# Patient Record
Sex: Male | Born: 1945 | Race: White | Hispanic: No | Smoking: Current every day smoker
Health system: Southern US, Community
[De-identification: ages and names within clinical notes are randomized; demographics above are authoritative.]

## PROBLEM LIST (undated history)

## (undated) DIAGNOSIS — F17200 Nicotine dependence, unspecified, uncomplicated: Secondary | ICD-10-CM

## (undated) DIAGNOSIS — J449 Chronic obstructive pulmonary disease, unspecified: Secondary | ICD-10-CM

## (undated) DIAGNOSIS — F1010 Alcohol abuse, uncomplicated: Secondary | ICD-10-CM

---

## 2019-07-27 ENCOUNTER — Other Ambulatory Visit: Payer: Self-pay

## 2019-07-27 ENCOUNTER — Emergency Department: Payer: Medicare Other

## 2019-07-27 ENCOUNTER — Inpatient Hospital Stay
Admission: EM | Admit: 2019-07-27 | Discharge: 2019-08-20 | DRG: 870 | Disposition: A | Discharge status: Skilled Nursing Facility | Payer: Medicare Other | Attending: Internal Medicine | Admitting: Internal Medicine

## 2019-07-27 DIAGNOSIS — N390 Urinary tract infection, site not specified: Secondary | ICD-10-CM | POA: Diagnosis present

## 2019-07-27 DIAGNOSIS — N170 Acute kidney failure with tubular necrosis: Secondary | ICD-10-CM | POA: Diagnosis not present

## 2019-07-27 DIAGNOSIS — I350 Nonrheumatic aortic (valve) stenosis: Secondary | ICD-10-CM | POA: Diagnosis present

## 2019-07-27 DIAGNOSIS — F17210 Nicotine dependence, cigarettes, uncomplicated: Secondary | ICD-10-CM | POA: Diagnosis present

## 2019-07-27 DIAGNOSIS — I808 Phlebitis and thrombophlebitis of other sites: Secondary | ICD-10-CM | POA: Diagnosis not present

## 2019-07-27 DIAGNOSIS — J9690 Respiratory failure, unspecified, unspecified whether with hypoxia or hypercapnia: Secondary | ICD-10-CM | POA: Diagnosis not present

## 2019-07-27 DIAGNOSIS — I5031 Acute diastolic (congestive) heart failure: Secondary | ICD-10-CM

## 2019-07-27 DIAGNOSIS — R001 Bradycardia, unspecified: Secondary | ICD-10-CM | POA: Diagnosis not present

## 2019-07-27 DIAGNOSIS — K5900 Constipation, unspecified: Secondary | ICD-10-CM | POA: Diagnosis present

## 2019-07-27 DIAGNOSIS — E232 Diabetes insipidus: Secondary | ICD-10-CM | POA: Diagnosis present

## 2019-07-27 DIAGNOSIS — R918 Other nonspecific abnormal finding of lung field: Secondary | ICD-10-CM

## 2019-07-27 DIAGNOSIS — J9600 Acute respiratory failure, unspecified whether with hypoxia or hypercapnia: Secondary | ICD-10-CM

## 2019-07-27 DIAGNOSIS — E870 Hyperosmolality and hypernatremia: Secondary | ICD-10-CM

## 2019-07-27 DIAGNOSIS — R404 Transient alteration of awareness: Secondary | ICD-10-CM | POA: Diagnosis present

## 2019-07-27 DIAGNOSIS — R0989 Other specified symptoms and signs involving the circulatory and respiratory systems: Secondary | ICD-10-CM

## 2019-07-27 DIAGNOSIS — G459 Transient cerebral ischemic attack, unspecified: Secondary | ICD-10-CM

## 2019-07-27 DIAGNOSIS — A419 Sepsis, unspecified organism: Principal | ICD-10-CM | POA: Diagnosis present

## 2019-07-27 DIAGNOSIS — G9341 Metabolic encephalopathy: Secondary | ICD-10-CM

## 2019-07-27 DIAGNOSIS — J984 Other disorders of lung: Secondary | ICD-10-CM

## 2019-07-27 DIAGNOSIS — I110 Hypertensive heart disease with heart failure: Secondary | ICD-10-CM | POA: Diagnosis present

## 2019-07-27 DIAGNOSIS — Z6841 Body Mass Index (BMI) 40.0 and over, adult: Secondary | ICD-10-CM

## 2019-07-27 DIAGNOSIS — E0590 Thyrotoxicosis, unspecified without thyrotoxic crisis or storm: Secondary | ICD-10-CM | POA: Diagnosis not present

## 2019-07-27 DIAGNOSIS — I10 Essential (primary) hypertension: Secondary | ICD-10-CM

## 2019-07-27 DIAGNOSIS — J9601 Acute respiratory failure with hypoxia: Secondary | ICD-10-CM | POA: Diagnosis present

## 2019-07-27 DIAGNOSIS — T82868A Thrombosis of vascular prosthetic devices, implants and grafts, initial encounter: Secondary | ICD-10-CM | POA: Diagnosis not present

## 2019-07-27 DIAGNOSIS — Z66 Do not resuscitate: Secondary | ICD-10-CM | POA: Diagnosis present

## 2019-07-27 DIAGNOSIS — L304 Erythema intertrigo: Secondary | ICD-10-CM | POA: Diagnosis not present

## 2019-07-27 DIAGNOSIS — F1027 Alcohol dependence with alcohol-induced persisting dementia: Secondary | ICD-10-CM | POA: Diagnosis present

## 2019-07-27 DIAGNOSIS — D329 Benign neoplasm of meninges, unspecified: Secondary | ICD-10-CM | POA: Diagnosis present

## 2019-07-27 DIAGNOSIS — E7220 Disorder of urea cycle metabolism, unspecified: Secondary | ICD-10-CM | POA: Diagnosis not present

## 2019-07-27 DIAGNOSIS — I4892 Unspecified atrial flutter: Secondary | ICD-10-CM | POA: Diagnosis not present

## 2019-07-27 DIAGNOSIS — B370 Candidal stomatitis: Secondary | ICD-10-CM | POA: Diagnosis not present

## 2019-07-27 DIAGNOSIS — G9389 Other specified disorders of brain: Secondary | ICD-10-CM | POA: Diagnosis not present

## 2019-07-27 DIAGNOSIS — F10231 Alcohol dependence with withdrawal delirium: Secondary | ICD-10-CM | POA: Diagnosis not present

## 2019-07-27 DIAGNOSIS — J690 Pneumonitis due to inhalation of food and vomit: Secondary | ICD-10-CM | POA: Diagnosis not present

## 2019-07-27 DIAGNOSIS — Z20822 Contact with and (suspected) exposure to covid-19: Secondary | ICD-10-CM | POA: Diagnosis present

## 2019-07-27 DIAGNOSIS — Z452 Encounter for adjustment and management of vascular access device: Secondary | ICD-10-CM

## 2019-07-27 DIAGNOSIS — J811 Chronic pulmonary edema: Secondary | ICD-10-CM | POA: Diagnosis present

## 2019-07-27 DIAGNOSIS — J441 Chronic obstructive pulmonary disease with (acute) exacerbation: Secondary | ICD-10-CM | POA: Diagnosis not present

## 2019-07-27 DIAGNOSIS — R451 Restlessness and agitation: Secondary | ICD-10-CM | POA: Diagnosis not present

## 2019-07-27 DIAGNOSIS — Z4659 Encounter for fitting and adjustment of other gastrointestinal appliance and device: Secondary | ICD-10-CM

## 2019-07-27 DIAGNOSIS — J9602 Acute respiratory failure with hypercapnia: Secondary | ICD-10-CM | POA: Diagnosis present

## 2019-07-27 DIAGNOSIS — R1310 Dysphagia, unspecified: Secondary | ICD-10-CM | POA: Diagnosis present

## 2019-07-27 HISTORY — DX: Nicotine dependence, unspecified, uncomplicated: F17.200

## 2019-07-27 HISTORY — DX: Alcohol abuse, uncomplicated: F10.10

## 2019-07-27 LAB — URINALYSIS, COMPLETE (UACMP) WITH MICROSCOPIC
Bilirubin Urine: NEGATIVE
Glucose, UA: >=500 mg/dL — AB
Ketones, ur: NEGATIVE mg/dL
Leukocytes,Ua: NEGATIVE
Nitrite: NEGATIVE
Protein, ur: >=300 mg/dL — AB
Specific Gravity, Urine: 1.009 (ref 1.005–1.030)
pH: 6 (ref 5.0–8.0)

## 2019-07-27 LAB — CBC WITH DIFFERENTIAL/PLATELET
Abs Immature Granulocytes: 0.25 10*3/uL — ABNORMAL HIGH (ref 0.00–0.07)
Basophils Absolute: 0.1 10*3/uL (ref 0.0–0.1)
Basophils Relative: 1 %
Eosinophils Absolute: 0.4 10*3/uL (ref 0.0–0.5)
Eosinophils Relative: 2 %
HCT: 48.2 % (ref 39.0–52.0)
Hemoglobin: 14.6 g/dL (ref 13.0–17.0)
Immature Granulocytes: 1 %
Lymphocytes Relative: 54 %
Lymphs Abs: 10.5 10*3/uL — ABNORMAL HIGH (ref 0.7–4.0)
MCH: 31.3 pg (ref 26.0–34.0)
MCHC: 30.3 g/dL (ref 30.0–36.0)
MCV: 103.2 fL — ABNORMAL HIGH (ref 80.0–100.0)
Monocytes Absolute: 1.3 10*3/uL — ABNORMAL HIGH (ref 0.1–1.0)
Monocytes Relative: 7 %
Neutro Abs: 6.9 10*3/uL (ref 1.7–7.7)
Neutrophils Relative %: 35 %
Platelets: 327 10*3/uL (ref 150–400)
RBC: 4.67 MIL/uL (ref 4.22–5.81)
RDW: 13.7 % (ref 11.5–15.5)
WBC: 19.5 10*3/uL — ABNORMAL HIGH (ref 4.0–10.5)
nRBC: 0 % (ref 0.0–0.2)

## 2019-07-27 LAB — BLOOD GAS, ARTERIAL
Acid-base deficit: 12 mmol/L — ABNORMAL HIGH (ref 0.0–2.0)
Bicarbonate: 19.7 mmol/L — ABNORMAL LOW (ref 20.0–28.0)
FIO2: 100
MECHVT: 550 mL
O2 Saturation: 99.9 %
PEEP: 5 cmH2O
Patient temperature: 37
RATE: 16 resp/min
pCO2 arterial: 73 mmHg (ref 32.0–48.0)
pH, Arterial: 7.04 — CL (ref 7.350–7.450)
pO2, Arterial: 399 mmHg — ABNORMAL HIGH (ref 83.0–108.0)

## 2019-07-27 LAB — POC SARS CORONAVIRUS 2 AG: SARS Coronavirus 2 Ag: NEGATIVE

## 2019-07-27 LAB — RESPIRATORY PANEL BY RT PCR (FLU A&B, COVID)
Influenza A by PCR: NEGATIVE
Influenza B by PCR: NEGATIVE
SARS Coronavirus 2 by RT PCR: NEGATIVE

## 2019-07-27 LAB — LACTIC ACID, PLASMA: Lactic Acid, Venous: >11 mmol/L (ref 0.5–1.9)

## 2019-07-27 IMAGING — DX DG CHEST 1V PORT
1 series · 1 of 1 positions shown · non-contrast
Comparison: None.

CLINICAL DATA: Status post intubation

EXAM:
PORTABLE CHEST 1 VIEW

[chest ap]
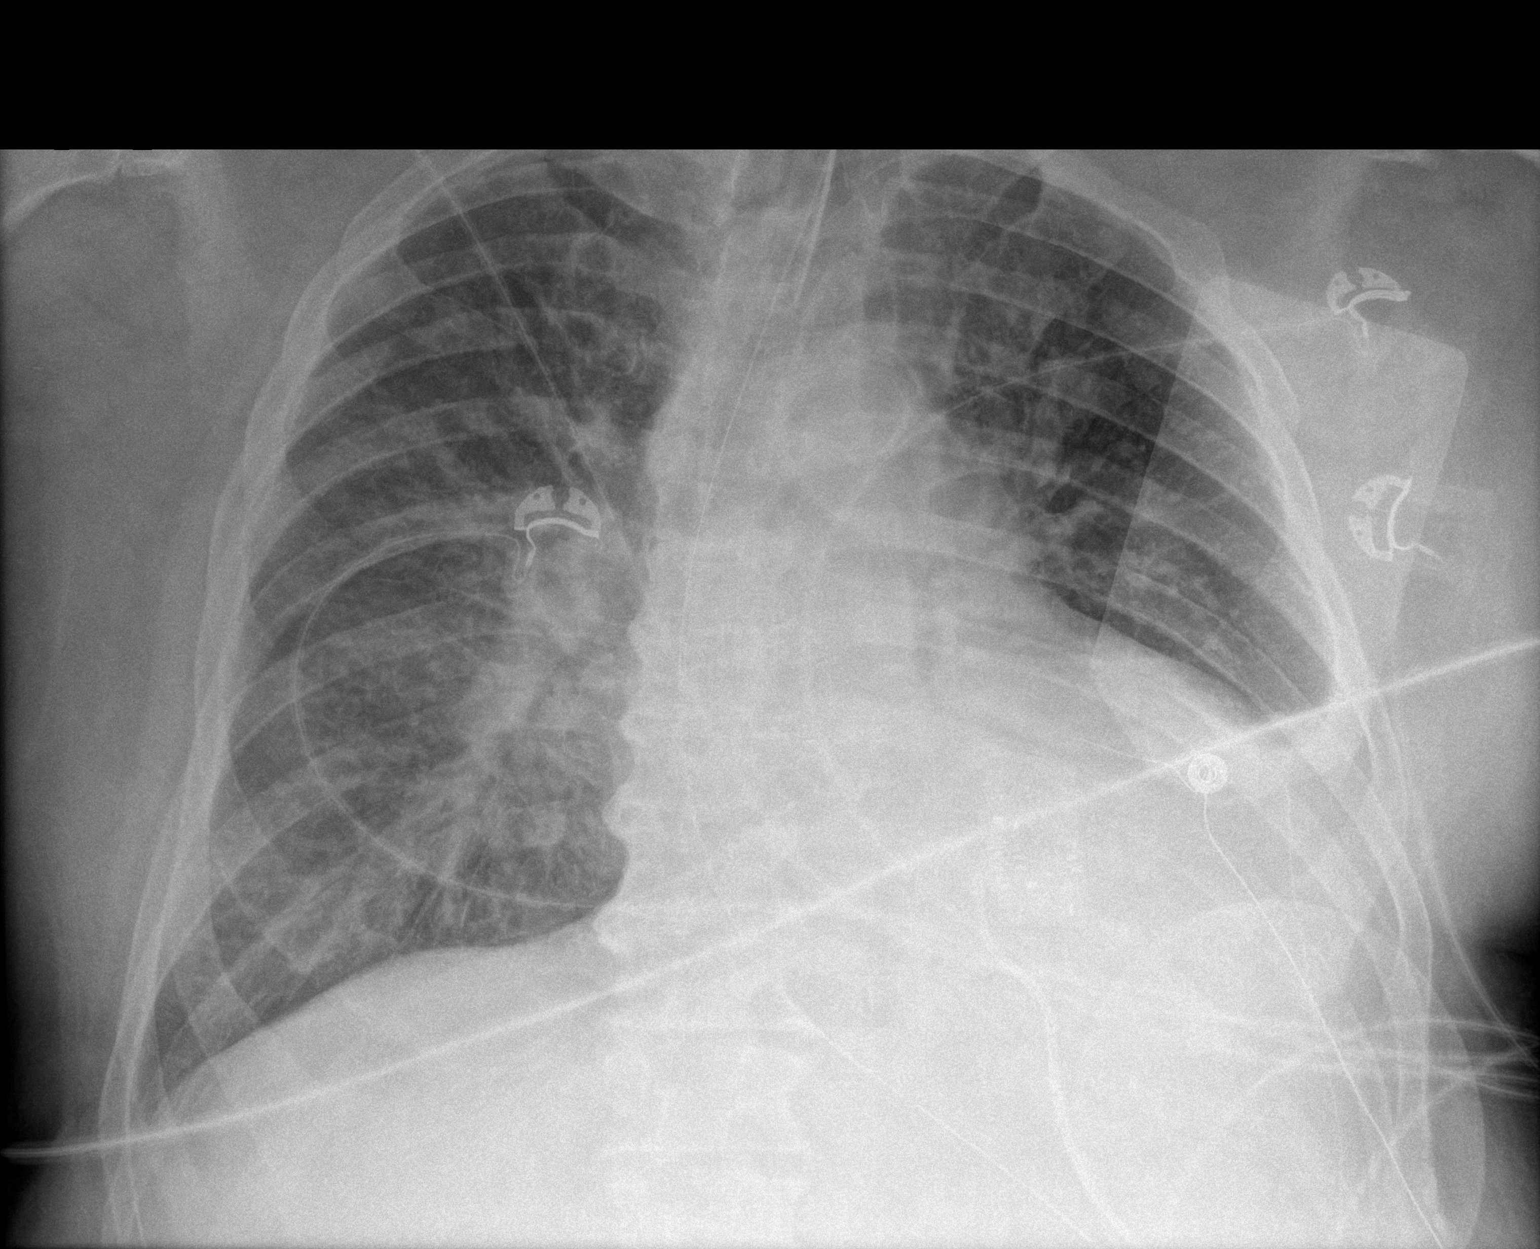

[1 of 1 positions shown; findings below may reference images not displayed]

FINDINGS: Cardiac shadow is mildly enlarged. Small left pleural effusion is
noted. Vascular congestion is seen with mild edema. Endotracheal
tube and gastric catheter are noted in satisfactory position.
IMPRESSION: Small left pleural effusion.

Changes consistent with CHF.

Tubes and lines as described above.

## 2019-07-27 IMAGING — DX DG ABDOMEN 1V
1 series · 1 of 1 positions shown · non-contrast
Comparison: None.

CLINICAL DATA: Check gastric catheter placement

EXAM:
ABDOMEN - 1 VIEW

[abdomen supine]
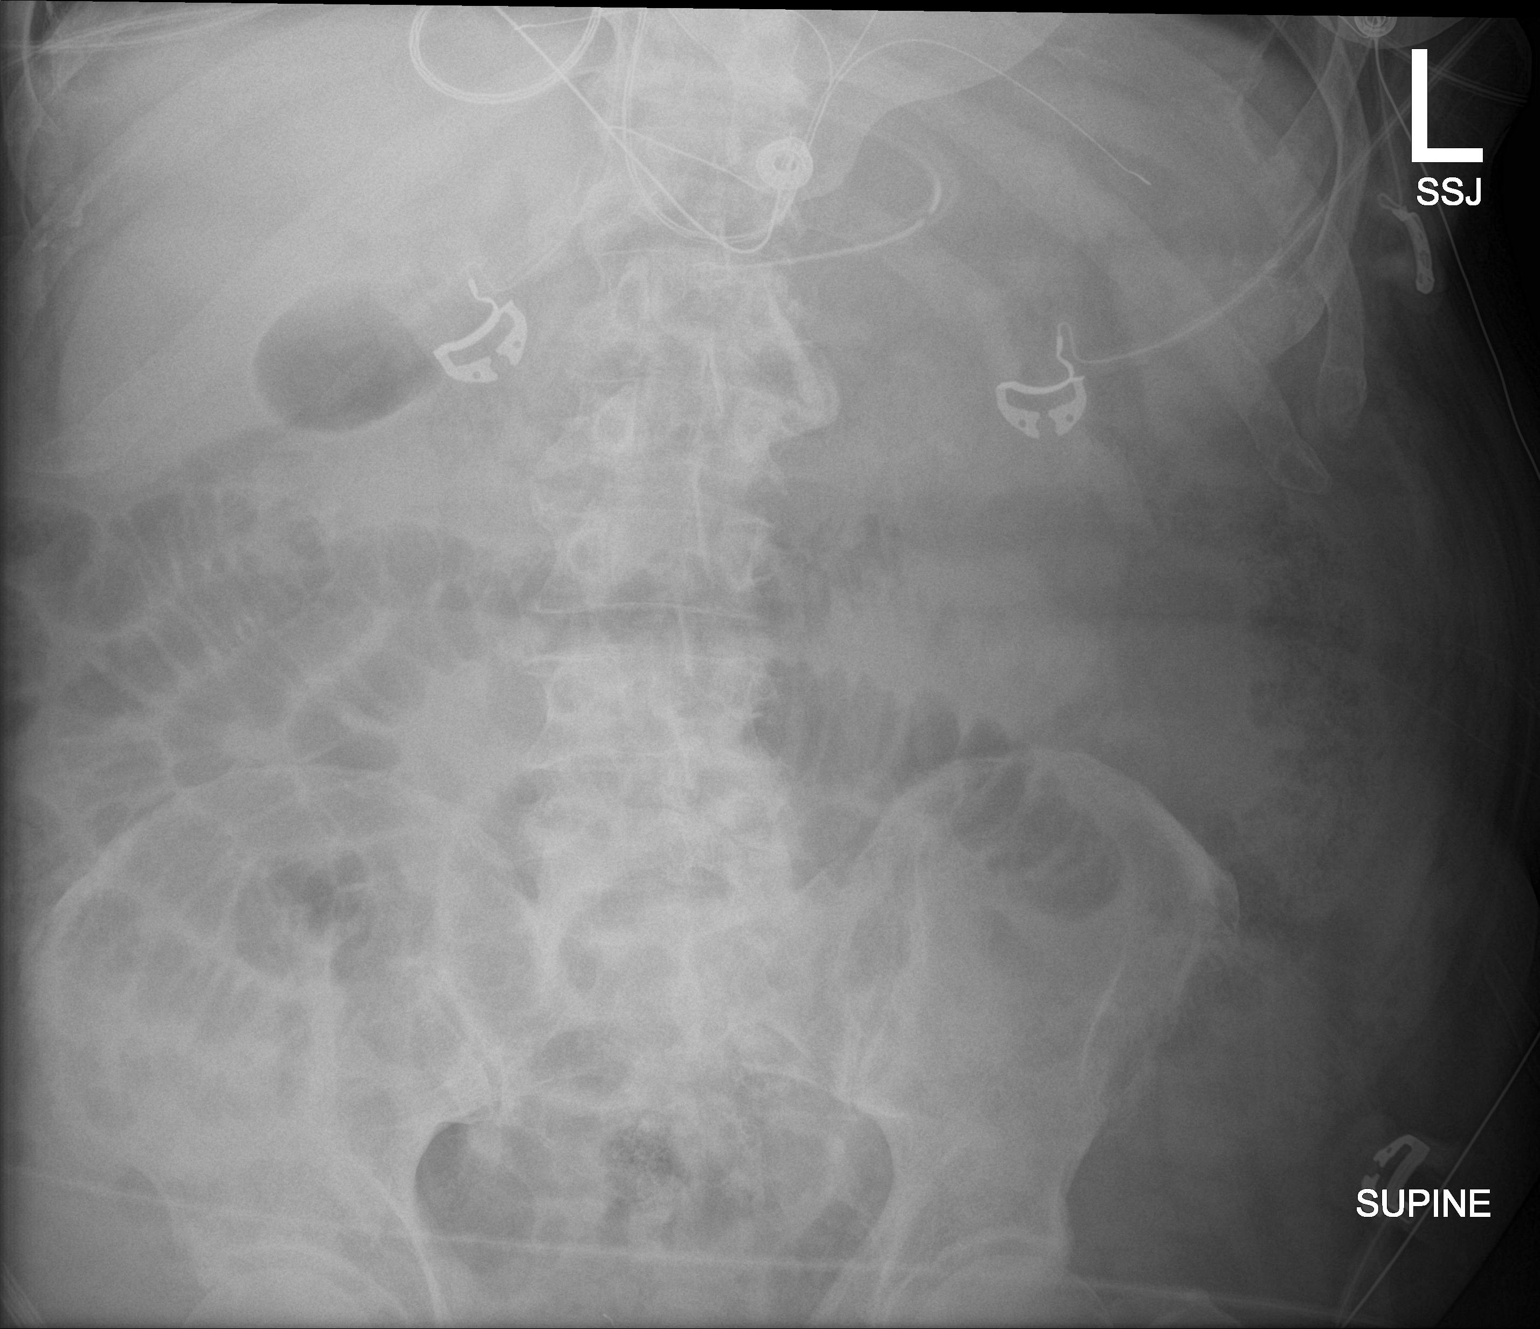

[1 of 1 positions shown; findings below may reference images not displayed]

FINDINGS: Mild prominence of the small bowel is noted without definitive
obstructive change. Gastric catheter is noted within the stomach. No
bony abnormality is seen.
IMPRESSION: Gastric catheter in the stomach.

Mild prominence of the small bowel. CT would be helpful for further
evaluation.

## 2019-07-27 MED ORDER — VANCOMYCIN HCL IN DEXTROSE 1-5 GM/200ML-% IV SOLN
1000.0000 mg | Freq: Once | INTRAVENOUS | Status: AC
  Administered 2019-07-28: 1000 mg via INTRAVENOUS
  Filled 2019-07-27: qty 200

## 2019-07-27 MED ORDER — PROPOFOL 1000 MG/100ML IV EMUL
5.0000 ug/kg/min | INTRAVENOUS | Status: DC
  Administered 2019-07-27: 23:00:00 20 ug/kg/min via INTRAVENOUS
  Administered 2019-07-28: 50 ug/kg/min via INTRAVENOUS
  Administered 2019-07-28 (×3): 30 ug/kg/min via INTRAVENOUS
  Administered 2019-07-28: 50 ug/kg/min via INTRAVENOUS
  Administered 2019-07-29 (×3): 30 ug/kg/min via INTRAVENOUS
  Administered 2019-07-29: 40 ug/kg/min via INTRAVENOUS
  Administered 2019-07-29: 30 ug/kg/min via INTRAVENOUS
  Administered 2019-07-30 (×3): 40 ug/kg/min via INTRAVENOUS
  Administered 2019-07-30 – 2019-08-01 (×5): 30 ug/kg/min via INTRAVENOUS
  Administered 2019-08-01: 19:00:00 19.988 ug/kg/min via INTRAVENOUS
  Administered 2019-08-01: 20 ug/kg/min via INTRAVENOUS
  Administered 2019-08-02: 02:00:00 30 ug/kg/min via INTRAVENOUS
  Administered 2019-08-02: 25 ug/kg/min via INTRAVENOUS
  Administered 2019-08-02: 17:00:00 30 ug/kg/min via INTRAVENOUS
  Administered 2019-08-02: 11:00:00 20 ug/kg/min via INTRAVENOUS
  Administered 2019-08-02 – 2019-08-03 (×3): 30 ug/kg/min via INTRAVENOUS
  Filled 2019-07-27 (×34): qty 100

## 2019-07-27 MED ORDER — VECURONIUM BROMIDE 10 MG IV SOLR
10.0000 mg | Freq: Once | INTRAVENOUS | Status: AC
  Administered 2019-07-27: 10 mg via INTRAVENOUS

## 2019-07-27 MED ORDER — PIPERACILLIN-TAZOBACTAM 3.375 G IVPB 30 MIN
3.3750 g | Freq: Once | INTRAVENOUS | Status: AC
  Administered 2019-07-28: 3.375 g via INTRAVENOUS
  Filled 2019-07-27: qty 50

## 2019-07-27 NOTE — ED Notes (Signed)
100 succs given by Rosalita Chessman

## 2019-07-27 NOTE — ED Provider Notes (Addendum)
[facility] Emergency Department Provider Note       Time seen: ----------------------------------------- 10:40 PM on 07/27/2019 ----------------------------------------- Level V caveat: History/ROS limited by respiratory distress  I have reviewed the triage vital signs and the nursing notes.  HISTORY   Chief Complaint Respiratory Distress   HPI Antonio [name] is a 74 y.o. male with no known past medical history who presents to the ED for respiratory distress.  Patient reportedly had called EMS and went out to meet the EMS unit and collapsed.  He arrived unresponsive with a pulse receiving bag-valve-mask ventilations.  No further information is known at this time.  History reviewed. No pertinent past medical history.  There are no problems to display for this patient.   History reviewed. No pertinent surgical history.  Allergies Patient has no allergy information on record.  Social History Social History   Tobacco Use  . Smoking status: Not on file  . Smokeless tobacco: Never Used  Substance Use Topics  . Alcohol use: Never  . Drug use: Never    Review of Systems Unknown, reported shortness of breath  All systems negative/normal/unremarkable except as stated in the HPI  ____________________________________________   PHYSICAL EXAM:  VITAL SIGNS: ED Triage Vitals  Enc Vitals Group     BP 07/27/19 2230 (!) 258/105     Pulse Rate 07/27/19 2230 (!) 116     Resp 07/27/19 2230 (!) 24     Temp 07/27/19 2238 (!) 95 F (35 C)     Temp src --      SpO2 07/27/19 2230 92 %     Weight 07/27/19 2231 250 lb (113.4 kg)     Height 07/27/19 2231 5\' 10"  (1.778 m)     Head Circumference --      Peak Flow --      Pain Score 07/27/19 2231 Asleep     Pain Loc --      Pain Edu? --      Excl. in [place]? --     Constitutional: Patient is not alert, does not respond to pain, severe distress Eyes: Conjunctivae are injected bilaterally ENT      Head:  Normocephalic and atraumatic.      Nose: No congestion/rhinnorhea.      Mouth/Throat: Mucous membranes are moist.      Neck: No stridor. Cardiovascular: Rapid rate, regular rhythm. No murmurs, rubs, or gallops. Respiratory: Wheezing and crackles bilaterally, diminished breath sounds Gastrointestinal: Soft and nontender. Normal bowel sounds Musculoskeletal: Nontender with normal range of motion in extremities.  Mild edema Neurologic: Patient does not respond to painful stimuli on arrival Skin:  Skin is warm, dry and intact.  Mottled appearing skin Psychiatric: Cannot cooperate with examination ____________________________________________  EKG: Interpreted by me.  Sinus tachycardia with rate of 170 bpm, normal PR interval, possible anterior infarct  ____________________________________________  ED COURSE:  As part of my medical decision making, I reviewed the following data within the [place] History obtained from family if available, nursing notes, old chart and ekg, as well as notes from prior ED visits. Patient presented for acute respiratory distress, we will assess with labs and imaging as indicated at this time. Clinical Course as of Jul 26 2301  Fri Jul 27, 2019  2248 Heart rate and blood pressure appear to be improving at this time post intubation   [JW]    Clinical Course User Index [JW] Earleen Newport, MD   Procedure Name: Intubation Date/Time: 07/27/2019  10:43 PM Performed by: Earleen Newport, MD Pre-anesthesia Checklist: Patient identified, Patient being monitored, Emergency Drugs available, Timeout performed and Suction available Oxygen Delivery Method: Non-rebreather mask Preoxygenation: Pre-oxygenation with 100% oxygen Induction Type: Rapid sequence Ventilation: Mask ventilation without difficulty Laryngoscope Size: 4 Tube size: 7.5 mm Number of attempts: 2 Placement Confirmation: ETT inserted through vocal cords under direct vision,   CO2 detector and Breath sounds checked- equal and bilateral Dental Injury: Teeth and Oropharynx as per pre-operative assessment  Difficulty Due To: Difficult Airway- due to large tongue, Difficult Airway-  due to edematous airway, Difficult Airway- due to limited oral opening and Difficult Airway-due to vocal cord/laryngeal edema     OG placement  Date/Time: 07/27/2019 11:04 PM Performed by: Earleen Newport, MD Authorized by: Earleen Newport, MD  Consent: The procedure was performed in an emergent situation. Time out: Immediately prior to procedure a "time out" was called to verify the correct patient, procedure, equipment, support staff and site/side marked as required. Preparation: Patient was prepped and draped in the usual sterile fashion. Local anesthesia used: no  Anesthesia: Local anesthesia used: no  Sedation: Patient sedated: no     Antonio [name] was evaluated in Emergency Department on 07/27/2019 for the symptoms described in the history of present illness. He was evaluated in the context of the global COVID-19 pandemic, which necessitated consideration that the patient might be at risk for infection with the SARS-CoV-2 virus that causes COVID-19. Institutional protocols and algorithms that pertain to the evaluation of patients at risk for COVID-19 are in a state of rapid change based on information released by regulatory bodies including the CDC and federal and state organizations. These policies and algorithms were followed during the patient's care in the ED.  ____________________________________________   LABS (pertinent positives/negatives)  Labs Reviewed  LACTIC ACID, PLASMA - Abnormal; Notable for the following components:      Result Value   Lactic Acid, Venous >11.0 (*)    All other components within normal limits  CBC WITH DIFFERENTIAL/PLATELET - Abnormal; Notable for the following components:   WBC 19.5 (*)    MCV 103.2 (*)    Lymphs Abs 10.5 (*)     Monocytes Absolute 1.3 (*)    Abs Immature Granulocytes 0.25 (*)    All other components within normal limits  BLOOD GAS, ARTERIAL - Abnormal; Notable for the following components:   pH, Arterial 7.04 (*)    pCO2 arterial 73 (*)    pO2, Arterial 399 (*)    Bicarbonate 19.7 (*)    Acid-base deficit 12.0 (*)    All other components within normal limits  URINALYSIS, COMPLETE (UACMP) WITH MICROSCOPIC - Abnormal; Notable for the following components:   Color, Urine YELLOW (*)    APPearance CLOUDY (*)    Glucose, UA >=500 (*)    Hgb urine dipstick SMALL (*)    Protein, ur >=300 (*)    Bacteria, UA RARE (*)    All other components within normal limits  RESPIRATORY PANEL BY RT PCR (FLU A&B, COVID)  CULTURE, BLOOD (ROUTINE X 2)  CULTURE, BLOOD (ROUTINE X 2)  URINE CULTURE  LACTIC ACID, PLASMA  COMPREHENSIVE METABOLIC PANEL  BRAIN NATRIURETIC PEPTIDE  PATHOLOGIST SMEAR REVIEW  POC SARS CORONAVIRUS 2 AG  TROPONIN I (HIGH SENSITIVITY)   CRITICAL CARE Performed by: Laurence Aly   Total critical care time: 60 minutes  Critical care time was exclusive of separately billable procedures and treating other patients.  Critical  care was necessary to treat or prevent imminent or life-threatening deterioration.  Critical care was time spent personally by me on the following activities: development of treatment plan with patient and/or surrogate as well as nursing, discussions with consultants, evaluation of patient's response to treatment, examination of patient, obtaining history from patient or surrogate, ordering and performing treatments and interventions, ordering and review of laboratory studies, ordering and review of radiographic studies, pulse oximetry and re-evaluation of patient's condition.  RADIOLOGY Images were viewed by me  Chest x-ray Resembles pulmonary edema ____________________________________________   DIFFERENTIAL DIAGNOSIS   Acute respiratory failure,  hypercarbia, hypoxemia, PE, CHF, COVID-19, pneumonia  FINAL ASSESSMENT AND PLAN  Acute respiratory failure with hypoxia and hypercapnia, acute pulmonary edema   Plan: The patient had presented for acute respiratory failure. Patient's labs did reveal acidemia and hypercarbia. Patient's imaging resembled pulmonary edema.  Immediately on arrival as dictated above he was intubated and placed on a propofol drip for sedation.  Heart rate and blood pressure improved dramatically over time.  We still do not have any information on this patient.  I will discuss with the ICU doctor for admission.   Laurence Aly, MD    Note: This note was generated in part or whole with voice recognition software. Voice recognition is usually quite accurate but there are transcription errors that can and very often do occur. I apologize for any typographical errors that were not detected and corrected.     Earleen Newport, MD 07/27/19 [id number]    Earleen Newport, MD 07/27/19 2351

## 2019-07-27 NOTE — ED Triage Notes (Signed)
Pt to ED via EMS from home. Per ems pt walked out to truck, collapsed on stretcher and became unresponsive. Pt never lost pulses but became apneic. Pt being bagged on ems arrival, sats 98%. HR 118 ST on monitor. Pt has unknown history. MD williams preparing for intubation at bedside. Pt unresponsive to all stimuli.

## 2019-07-27 NOTE — ED Notes (Signed)
20 etomidate given by Rosalita Chessman

## 2019-07-27 NOTE — ED Notes (Signed)
MD to bediside increased o2 on vent to 60

## 2019-07-28 DIAGNOSIS — Z20822 Contact with and (suspected) exposure to covid-19: Secondary | ICD-10-CM | POA: Diagnosis present

## 2019-07-28 DIAGNOSIS — B370 Candidal stomatitis: Secondary | ICD-10-CM | POA: Diagnosis not present

## 2019-07-28 DIAGNOSIS — G9341 Metabolic encephalopathy: Secondary | ICD-10-CM | POA: Diagnosis not present

## 2019-07-28 DIAGNOSIS — J9601 Acute respiratory failure with hypoxia: Secondary | ICD-10-CM | POA: Diagnosis present

## 2019-07-28 DIAGNOSIS — N170 Acute kidney failure with tubular necrosis: Secondary | ICD-10-CM | POA: Diagnosis not present

## 2019-07-28 DIAGNOSIS — J441 Chronic obstructive pulmonary disease with (acute) exacerbation: Secondary | ICD-10-CM | POA: Diagnosis not present

## 2019-07-28 DIAGNOSIS — A419 Sepsis, unspecified organism: Secondary | ICD-10-CM | POA: Diagnosis present

## 2019-07-28 DIAGNOSIS — Z6841 Body Mass Index (BMI) 40.0 and over, adult: Secondary | ICD-10-CM | POA: Diagnosis not present

## 2019-07-28 DIAGNOSIS — F10231 Alcohol dependence with withdrawal delirium: Secondary | ICD-10-CM | POA: Diagnosis not present

## 2019-07-28 DIAGNOSIS — J9602 Acute respiratory failure with hypercapnia: Secondary | ICD-10-CM | POA: Diagnosis present

## 2019-07-28 DIAGNOSIS — J9690 Respiratory failure, unspecified, unspecified whether with hypoxia or hypercapnia: Secondary | ICD-10-CM | POA: Diagnosis present

## 2019-07-28 DIAGNOSIS — E7220 Disorder of urea cycle metabolism, unspecified: Secondary | ICD-10-CM | POA: Diagnosis not present

## 2019-07-28 DIAGNOSIS — N390 Urinary tract infection, site not specified: Secondary | ICD-10-CM | POA: Diagnosis present

## 2019-07-28 DIAGNOSIS — I5031 Acute diastolic (congestive) heart failure: Secondary | ICD-10-CM | POA: Diagnosis present

## 2019-07-28 DIAGNOSIS — F1027 Alcohol dependence with alcohol-induced persisting dementia: Secondary | ICD-10-CM | POA: Diagnosis present

## 2019-07-28 DIAGNOSIS — I4892 Unspecified atrial flutter: Secondary | ICD-10-CM | POA: Diagnosis not present

## 2019-07-28 DIAGNOSIS — J811 Chronic pulmonary edema: Secondary | ICD-10-CM | POA: Diagnosis present

## 2019-07-28 DIAGNOSIS — E870 Hyperosmolality and hypernatremia: Secondary | ICD-10-CM | POA: Diagnosis not present

## 2019-07-28 DIAGNOSIS — I350 Nonrheumatic aortic (valve) stenosis: Secondary | ICD-10-CM | POA: Diagnosis present

## 2019-07-28 DIAGNOSIS — I110 Hypertensive heart disease with heart failure: Secondary | ICD-10-CM | POA: Diagnosis present

## 2019-07-28 DIAGNOSIS — E232 Diabetes insipidus: Secondary | ICD-10-CM | POA: Diagnosis present

## 2019-07-28 DIAGNOSIS — I808 Phlebitis and thrombophlebitis of other sites: Secondary | ICD-10-CM | POA: Diagnosis not present

## 2019-07-28 DIAGNOSIS — J690 Pneumonitis due to inhalation of food and vomit: Secondary | ICD-10-CM | POA: Diagnosis not present

## 2019-07-28 DIAGNOSIS — T82868A Thrombosis of vascular prosthetic devices, implants and grafts, initial encounter: Secondary | ICD-10-CM | POA: Diagnosis not present

## 2019-07-28 DIAGNOSIS — Z66 Do not resuscitate: Secondary | ICD-10-CM | POA: Diagnosis present

## 2019-07-28 LAB — BLOOD GAS, ARTERIAL
Acid-Base Excess: 2.5 mmol/L — ABNORMAL HIGH (ref 0.0–2.0)
Bicarbonate: 26.3 mmol/L (ref 20.0–28.0)
FIO2: 0.6
MECHVT: 550 mL
O2 Saturation: 99.5 %
PEEP: 5 cmH2O
Patient temperature: 37
RATE: 20 resp/min
pCO2 arterial: 37 mmHg (ref 32.0–48.0)
pH, Arterial: 7.46 — ABNORMAL HIGH (ref 7.350–7.450)
pO2, Arterial: 162 mmHg — ABNORMAL HIGH (ref 83.0–108.0)

## 2019-07-28 LAB — TROPONIN I (HIGH SENSITIVITY): Troponin I (High Sensitivity): 96 ng/L — ABNORMAL HIGH (ref <18)

## 2019-07-28 LAB — COMPREHENSIVE METABOLIC PANEL
ALT: 24 U/L (ref 0–44)
AST: 30 U/L (ref 15–41)
Albumin: 3.9 g/dL (ref 3.5–5.0)
Alkaline Phosphatase: 76 U/L (ref 38–126)
Anion gap: 16 — ABNORMAL HIGH (ref 5–15)
BUN: 16 mg/dL (ref 8–23)
CO2: 20 mmol/L — ABNORMAL LOW (ref 22–32)
Calcium: 9.1 mg/dL (ref 8.9–10.3)
Chloride: 102 mmol/L (ref 98–111)
Creatinine, Ser: 1.46 mg/dL — ABNORMAL HIGH (ref 0.61–1.24)
GFR calc Af Amer: 33 mL/min — ABNORMAL LOW (ref >60)
GFR calc non Af Amer: 29 mL/min — ABNORMAL LOW (ref >60)
Glucose, Bld: 266 mg/dL — ABNORMAL HIGH (ref 70–99)
Potassium: 4 mmol/L (ref 3.5–5.1)
Sodium: 138 mmol/L (ref 135–145)
Total Bilirubin: 0.5 mg/dL (ref 0.3–1.2)
Total Protein: 7.2 g/dL (ref 6.5–8.1)

## 2019-07-28 LAB — CBC
HCT: 39.7 % (ref 39.0–52.0)
Hemoglobin: 13.2 g/dL (ref 13.0–17.0)
MCH: 31.8 pg (ref 26.0–34.0)
MCHC: 33.2 g/dL (ref 30.0–36.0)
MCV: 95.7 fL (ref 80.0–100.0)
Platelets: 269 10*3/uL (ref 150–400)
RBC: 4.15 MIL/uL — ABNORMAL LOW (ref 4.22–5.81)
RDW: 13.8 % (ref 11.5–15.5)
WBC: 9.3 10*3/uL (ref 4.0–10.5)
nRBC: 0 % (ref 0.0–0.2)

## 2019-07-28 LAB — URINALYSIS, COMPLETE (UACMP) WITH MICROSCOPIC
Bacteria, UA: NONE SEEN
Bilirubin Urine: NEGATIVE
Glucose, UA: NEGATIVE mg/dL
Ketones, ur: NEGATIVE mg/dL
Nitrite: NEGATIVE
Protein, ur: 100 mg/dL — AB
RBC / HPF: >50 RBC/hpf — ABNORMAL HIGH (ref 0–5)
Specific Gravity, Urine: 1.013 (ref 1.005–1.030)
Squamous Epithelial / HPF: NONE SEEN (ref 0–5)
pH: 5 (ref 5.0–8.0)

## 2019-07-28 LAB — BASIC METABOLIC PANEL
Anion gap: 8 (ref 5–15)
BUN: 21 mg/dL (ref 8–23)
CO2: 26 mmol/L (ref 22–32)
Calcium: 8.6 mg/dL — ABNORMAL LOW (ref 8.9–10.3)
Chloride: 103 mmol/L (ref 98–111)
Creatinine, Ser: 1.46 mg/dL — ABNORMAL HIGH (ref 0.61–1.24)
GFR calc Af Amer: 54 mL/min — ABNORMAL LOW (ref >60)
GFR calc non Af Amer: 47 mL/min — ABNORMAL LOW (ref >60)
Glucose, Bld: 121 mg/dL — ABNORMAL HIGH (ref 70–99)
Potassium: 4.6 mmol/L (ref 3.5–5.1)
Sodium: 137 mmol/L (ref 135–145)

## 2019-07-28 LAB — BRAIN NATRIURETIC PEPTIDE: B Natriuretic Peptide: 1138 pg/mL — ABNORMAL HIGH (ref 0.0–100.0)

## 2019-07-28 LAB — LACTIC ACID, PLASMA: Lactic Acid, Venous: 1.5 mmol/L (ref 0.5–1.9)

## 2019-07-28 LAB — GLUCOSE, CAPILLARY: Glucose-Capillary: 86 mg/dL (ref 70–99)

## 2019-07-28 LAB — MRSA PCR SCREENING: MRSA by PCR: NEGATIVE

## 2019-07-28 MED ORDER — FENTANYL CITRATE (PF) 100 MCG/2ML IJ SOLN
50.0000 ug | INTRAMUSCULAR | Status: DC | PRN
  Administered 2019-07-30 – 2019-08-06 (×2): 100 ug via INTRAVENOUS
  Filled 2019-07-28 (×2): qty 2

## 2019-07-28 MED ORDER — MIDAZOLAM HCL 2 MG/2ML IJ SOLN
2.0000 mg | INTRAMUSCULAR | Status: DC | PRN
  Administered 2019-08-05 – 2019-08-07 (×2): 2 mg via INTRAVENOUS
  Filled 2019-07-28: qty 2

## 2019-07-28 MED ORDER — MIDAZOLAM HCL 2 MG/2ML IJ SOLN
2.0000 mg | INTRAMUSCULAR | Status: DC | PRN
  Filled 2019-07-28: qty 2

## 2019-07-28 MED ORDER — ORAL CARE MOUTH RINSE
15.0000 mL | OROMUCOSAL | Status: DC
  Administered 2019-07-28 – 2019-08-15 (×146): 15 mL via OROMUCOSAL

## 2019-07-28 MED ORDER — ENOXAPARIN SODIUM 30 MG/0.3ML ~~LOC~~ SOLN
30.0000 mg | SUBCUTANEOUS | Status: DC
  Filled 2019-07-28: qty 0.3

## 2019-07-28 MED ORDER — ONDANSETRON HCL 4 MG/2ML IJ SOLN: 4.0000 mg | Freq: Four times a day (QID) | INTRAMUSCULAR | Status: DC | PRN

## 2019-07-28 MED ORDER — FAMOTIDINE IN NACL 20-0.9 MG/50ML-% IV SOLN
20.0000 mg | Freq: Two times a day (BID) | INTRAVENOUS | Status: DC
  Administered 2019-07-28 – 2019-07-29 (×5): 20 mg via INTRAVENOUS
  Filled 2019-07-28 (×5): qty 50

## 2019-07-28 MED ORDER — CHLORHEXIDINE GLUCONATE 0.12% ORAL RINSE (MEDLINE KIT)
15.0000 mL | Freq: Two times a day (BID) | OROMUCOSAL | Status: DC
  Administered 2019-07-28 – 2019-08-15 (×33): 15 mL via OROMUCOSAL

## 2019-07-28 MED ORDER — ENOXAPARIN SODIUM 30 MG/0.3ML ~~LOC~~ SOLN: 30.0000 mg | SUBCUTANEOUS | Status: DC

## 2019-07-28 MED ORDER — DOCUSATE SODIUM 50 MG/5ML PO LIQD
100.0000 mg | Freq: Two times a day (BID) | ORAL | Status: DC | PRN
  Filled 2019-07-28: qty 10

## 2019-07-28 MED ORDER — ACETAMINOPHEN 325 MG PO TABS
650.0000 mg | ORAL_TABLET | ORAL | Status: DC | PRN
  Administered 2019-08-07 – 2019-08-08 (×4): 650 mg via ORAL
  Filled 2019-07-28 (×4): qty 2

## 2019-07-28 MED ORDER — FENTANYL CITRATE (PF) 100 MCG/2ML IJ SOLN
50.0000 ug | INTRAMUSCULAR | Status: DC | PRN
  Administered 2019-07-29: 50 ug via INTRAVENOUS
  Filled 2019-07-28 (×2): qty 2

## 2019-07-28 MED ORDER — FAMOTIDINE 40 MG/5ML PO SUSR
20.0000 mg | Freq: Two times a day (BID) | ORAL | Status: DC
  Filled 2019-07-28 (×2): qty 2.5

## 2019-07-28 MED ORDER — ENOXAPARIN SODIUM 40 MG/0.4ML ~~LOC~~ SOLN
40.0000 mg | SUBCUTANEOUS | Status: DC
  Administered 2019-07-28 – 2019-08-20 (×24): 40 mg via SUBCUTANEOUS
  Filled 2019-07-28 (×24): qty 0.4

## 2019-07-28 MED ORDER — SODIUM CHLORIDE 0.9 % IV SOLN
250.0000 mL | INTRAVENOUS | Status: DC | PRN
  Administered 2019-08-06 – 2019-08-08 (×2): 250 mL via INTRAVENOUS

## 2019-07-28 MED ORDER — SODIUM CHLORIDE 0.9% FLUSH: 3.0000 mL | INTRAVENOUS | Status: DC | PRN

## 2019-07-28 MED ORDER — SODIUM CHLORIDE 0.9% FLUSH: 3.0000 mL | Freq: Two times a day (BID) | INTRAVENOUS | Status: DC

## 2019-07-28 MED ORDER — CHLORHEXIDINE GLUCONATE CLOTH 2 % EX PADS
6.0000 | MEDICATED_PAD | Freq: Every day | CUTANEOUS | Status: DC
  Administered 2019-07-28 – 2019-07-30 (×3): 6 via TOPICAL

## 2019-07-28 NOTE — ED Notes (Signed)
Patient with eyes closed, vent with resp appear relaxed, patient not moving or attempting to touch equipment. Propofol at 18mcg/kg/min.

## 2019-07-28 NOTE — H&P (Signed)
Name: Antonio [name] MRN: [id number] DOB: 07/19/1876     CONSULTATION DATE: 07/27/2019  REFERRING MD : Jimmye Norman  CHIEF COMPLAINT:  Acute resp failure  HISTORY OF PRESENT ILLNESS:   Unknown Morbidly obese WM Per ems pt walked out to truck, collapsed on stretcher and became unresponsive. Pt never lost pulses but became apneic. Pt being bagged on ems arrival, sats 98%. HR 118 ST on monitor. Pt has unknown history.   Patient with severe resp distress Emergently intubated and sedated in ER  Critically ill Multiorgan failure  ER COURSE INTUBATION SEDATION GIVEN ABX(VANC/ZOSYN) COVID NEG WBC 19, Creat 1.4   SOCIAL HISTORY:  does not have a smoking history on file. He has never used smokeless tobacco. He reports that he does not drink alcohol or use drugs.  REVIEW OF SYSTEMS:   Unable to obtain due to critical illness   VITAL SIGNS: Temp:  [95 F (35 C)-98.4 F (36.9 C)] 97.9 F (36.6 C) (01/09 0000) Pulse Rate:  [87-125] 93 (01/09 0000) Resp:  [16-24] 20 (01/09 0000) BP: (112-258)/(48-105) 124/61 (01/09 0000) SpO2:  [92 %-99 %] 94 % (01/09 0000) Weight:  [113.4 kg] 113.4 kg (01/08 2231)       SpO2: 94 %   Physical Examination:  GENERAL:critically ill appearing, +resp distress HEAD: Normocephalic, atraumatic.  EYES: Pupils equal, round, reactive to light.  No scleral icterus.  MOUTH: Moist mucosal membrane. NECK: Supple. No JVD.  PULMONARY: +rhonchi,  CARDIOVASCULAR: S1 and S2. Regular rate and rhythm. No murmurs, rubs, or gallops.  GASTROINTESTINAL: Soft, nontender, -distended.  Positive bowel sounds.  MUSCULOSKELETAL:+ edema.  NEUROLOGIC: obtunded SKIN:intact,warm,dry  I personally reviewed lab work that was obtained in last 24 hrs. CXR Independently reviewed-b/l interstitial infiltrates c/w pulm edema  MEDICATIONS: I have reviewed all medications and confirmed regimen as documented   CULTURE RESULTS   Recent Results (from the past 240 hour(s))    Respiratory Panel by RT PCR (Flu A&B, Covid) - Nasopharyngeal Swab     Status: None   Collection Time: 07/27/19 10:45 PM   Specimen: Nasopharyngeal Swab  Result Value Ref Range Status   SARS Coronavirus 2 by RT PCR NEGATIVE NEGATIVE Final    Comment: (NOTE) SARS-CoV-2 target nucleic acids are NOT DETECTED. The SARS-CoV-2 RNA is generally detectable in upper respiratoy specimens during the acute phase of infection. The lowest concentration of SARS-CoV-2 viral copies this assay can detect is 131 copies/mL. A negative result does not preclude SARS-Cov-2 infection and should not be used as the sole basis for treatment or other patient management decisions. A negative result may occur with  improper specimen collection/handling, submission of specimen other than nasopharyngeal swab, presence of viral mutation(s) within the areas targeted by this assay, and inadequate number of viral copies (<131 copies/mL). A negative result must be combined with clinical observations, patient history, and epidemiological information. The expected result is Negative. Fact Sheet for Patients:  PinkCheek.be Fact Sheet for Healthcare Providers:  GravelBags.it This test is not yet ap proved or cleared by the [place] FDA and  has been authorized for detection and/or diagnosis of SARS-CoV-2 by FDA under an Emergency Use Authorization (EUA). This EUA will remain  in effect (meaning this test can be used) for the duration of the COVID-19 declaration under Section 564(b)(1) of the Act, 21 U.S.C. section 360bbb-3(b)(1), unless the authorization is terminated or revoked sooner.    Influenza A by PCR NEGATIVE NEGATIVE Final   Influenza B by PCR NEGATIVE NEGATIVE Final  Comment: (NOTE) The Xpert Xpress SARS-CoV-2/FLU/RSV assay is intended as an aid in  the diagnosis of influenza from Nasopharyngeal swab specimens and  should not be used as a sole  basis for treatment. Nasal washings and  aspirates are unacceptable for Xpert Xpress SARS-CoV-2/FLU/RSV  testing. Fact Sheet for Patients: PinkCheek.be Fact Sheet for Healthcare Providers: GravelBags.it This test is not yet approved or cleared by the [place] FDA and  has been authorized for detection and/or diagnosis of SARS-CoV-2 by  FDA under an Emergency Use Authorization (EUA). This EUA will remain  in effect (meaning this test can be used) for the duration of the  Covid-19 declaration under Section 564(b)(1) of the Act, 21  U.S.C. section 360bbb-3(b)(1), unless the authorization is  terminated or revoked. Performed at [facility], [place], [place] [address]           IMAGING    DG Abdomen 1 View  Result Date: 07/27/2019 CLINICAL DATA:  Check gastric catheter placement EXAM: ABDOMEN - 1 VIEW COMPARISON:  None. FINDINGS: Mild prominence of the small bowel is noted without definitive obstructive change. Gastric catheter is noted within the stomach. No bony abnormality is seen. IMPRESSION: Gastric catheter in the stomach. Mild prominence of the small bowel. CT would be helpful for further evaluation. Electronically Signed   By: Inez Catalina M.D.   On: 07/27/2019 23:12   DG Chest Port 1 View  Result Date: 07/27/2019 CLINICAL DATA:  Status post intubation EXAM: PORTABLE CHEST 1 VIEW COMPARISON:  None. FINDINGS: Cardiac shadow is mildly enlarged. Small left pleural effusion is noted. Vascular congestion is seen with mild edema. Endotracheal tube and gastric catheter are noted in satisfactory position. IMPRESSION: Small left pleural effusion. Changes consistent with CHF. Tubes and lines as described above. Electronically Signed   By: Inez Catalina M.D.   On: 07/27/2019 23:13        Indwelling Urinary Catheter continued, requirement due to   Reason to continue Indwelling Urinary Catheter strict  Intake/Output monitoring for hemodynamic instability         Ventilator continued, requirement due to severe respiratory failure   Ventilator Sedation RASS 0 to -2      ASSESSMENT AND PLAN SYNOPSIS   Severe ACUTE Hypoxic and Hypercapnic Respiratory Failure from acute pulm edema -continue Full MV support -continue Bronchodilator Therapy -Wean Fio2 and PEEP as tolerated  ACUTE SYSTOLIC CARDIAC FAILURE- EF unknown -oxygen as needed -Lasix as tolerated -follow up cardiac enzymes as indicated Check ECHO  ACUTE KIDNEY INJURY/Renal Failure -follow chem 7 -follow UO -continue Foley Catheter-assess need -Avoid nephrotoxic agents  NEUROLOGY - intubated and sedated - minimal sedation to achieve a RASS goal: -1  CARDIAC ICU monitoring  ID Hold  IV abx as prescibed -follow up cultures  GI GI PROPHYLAXIS as indicated  NUTRITIONAL STATUS DIET-->NPO Constipation protocol as indicated   ENDO - will use ICU hypoglycemic\Hyperglycemia protocol if needed    ELECTROLYTES -follow labs as needed -replace as needed -pharmacy consultation and following   DVT/GI PRX ordered TRANSFUSIONS AS NEEDED MONITOR FSBS ASSESS the need for LABS    Critical Care Time devoted to patient care services described in this note is 45 minutes.   Overall, patient is critically ill, prognosis is guarded.  Patient with Multiorgan failure and at high risk for cardiac arrest and death.    Corrin Parker, M.D.  [facility]  Medical Director [place] Director [facility] Cardio-Pulmonary Department

## 2019-07-28 NOTE — Progress Notes (Signed)
PHARMACIST - PHYSICIAN COMMUNICATION  CONCERNING:  Enoxaparin (Lovenox) for DVT Prophylaxis   RECOMMENDATION: Patient was prescribed enoxaprin 30mg  q24 hours for VTE prophylaxis.   Filed Weights   07/27/19 2231  Weight: 250 lb (113.4 kg)    Body mass index is 35.87 kg/m.  Estimated Creatinine Clearance: 56 mL/min (A) (by C-G formula based on SCr of 1.46 mg/dL (H)).  Based on [place] patient is candidate for enoxaparin 40mg  every 24 hours. Patients CrCl is >52ml/min and  Weight >45kg  DESCRIPTION: Pharmacy has adjusted enoxaparin dose per [facility] policy.  Patient is now receiving enoxaparin 40mg  every 24 hours.   Pernell Dupre, PharmD, BCPS Clinical Pharmacist 07/28/2019 9:14 AM

## 2019-07-28 NOTE — ED Notes (Signed)
Report to ICU

## 2019-07-28 NOTE — ED Notes (Signed)
Inc of loose brown stool. Tolerated turning for cleaning well. Mouth suctioned of large amount of saliva.

## 2019-07-28 NOTE — ED Notes (Signed)
Pt had large liquid bowel movement, pt cleaned up and linens changed at this time

## 2019-07-29 ENCOUNTER — Inpatient Hospital Stay (HOSPITAL_COMMUNITY)
Admit: 2019-07-29 | Discharge: 2019-07-29 | Disposition: A | Discharge status: Home / Self Care | Payer: Medicare Other | Attending: Internal Medicine | Admitting: Internal Medicine

## 2019-07-29 ENCOUNTER — Inpatient Hospital Stay: Payer: Medicare Other

## 2019-07-29 DIAGNOSIS — I5021 Acute systolic (congestive) heart failure: Secondary | ICD-10-CM

## 2019-07-29 DIAGNOSIS — I350 Nonrheumatic aortic (valve) stenosis: Secondary | ICD-10-CM

## 2019-07-29 LAB — CBC
HCT: 38.3 % — ABNORMAL LOW (ref 39.0–52.0)
Hemoglobin: 12.5 g/dL — ABNORMAL LOW (ref 13.0–17.0)
MCH: 31.7 pg (ref 26.0–34.0)
MCHC: 32.6 g/dL (ref 30.0–36.0)
MCV: 97.2 fL (ref 80.0–100.0)
Platelets: 221 10*3/uL (ref 150–400)
RBC: 3.94 MIL/uL — ABNORMAL LOW (ref 4.22–5.81)
RDW: 14.2 % (ref 11.5–15.5)
WBC: 10.9 10*3/uL — ABNORMAL HIGH (ref 4.0–10.5)
nRBC: 0 % (ref 0.0–0.2)

## 2019-07-29 LAB — URINE CULTURE: Culture: NO GROWTH

## 2019-07-29 LAB — BASIC METABOLIC PANEL
Anion gap: 10 (ref 5–15)
BUN: 24 mg/dL — ABNORMAL HIGH (ref 8–23)
CO2: 24 mmol/L (ref 22–32)
Calcium: 8.3 mg/dL — ABNORMAL LOW (ref 8.9–10.3)
Chloride: 104 mmol/L (ref 98–111)
Creatinine, Ser: 1.42 mg/dL — ABNORMAL HIGH (ref 0.61–1.24)
GFR calc Af Amer: 56 mL/min — ABNORMAL LOW (ref >60)
GFR calc non Af Amer: 48 mL/min — ABNORMAL LOW (ref >60)
Glucose, Bld: 123 mg/dL — ABNORMAL HIGH (ref 70–99)
Potassium: 3.7 mmol/L (ref 3.5–5.1)
Sodium: 138 mmol/L (ref 135–145)

## 2019-07-29 LAB — BRAIN NATRIURETIC PEPTIDE: B Natriuretic Peptide: 146 pg/mL — ABNORMAL HIGH (ref 0.0–100.0)

## 2019-07-29 LAB — ECHOCARDIOGRAM COMPLETE
Height: 67 in
Weight: 4091.74 oz

## 2019-07-29 IMAGING — DX DG CHEST 1V PORT
1 series · 1 of 1 positions shown · non-contrast
Comparison: [DATE]

CLINICAL DATA: Acute respiratory failure.

EXAM:
PORTABLE CHEST 1 VIEW

[chest ap]
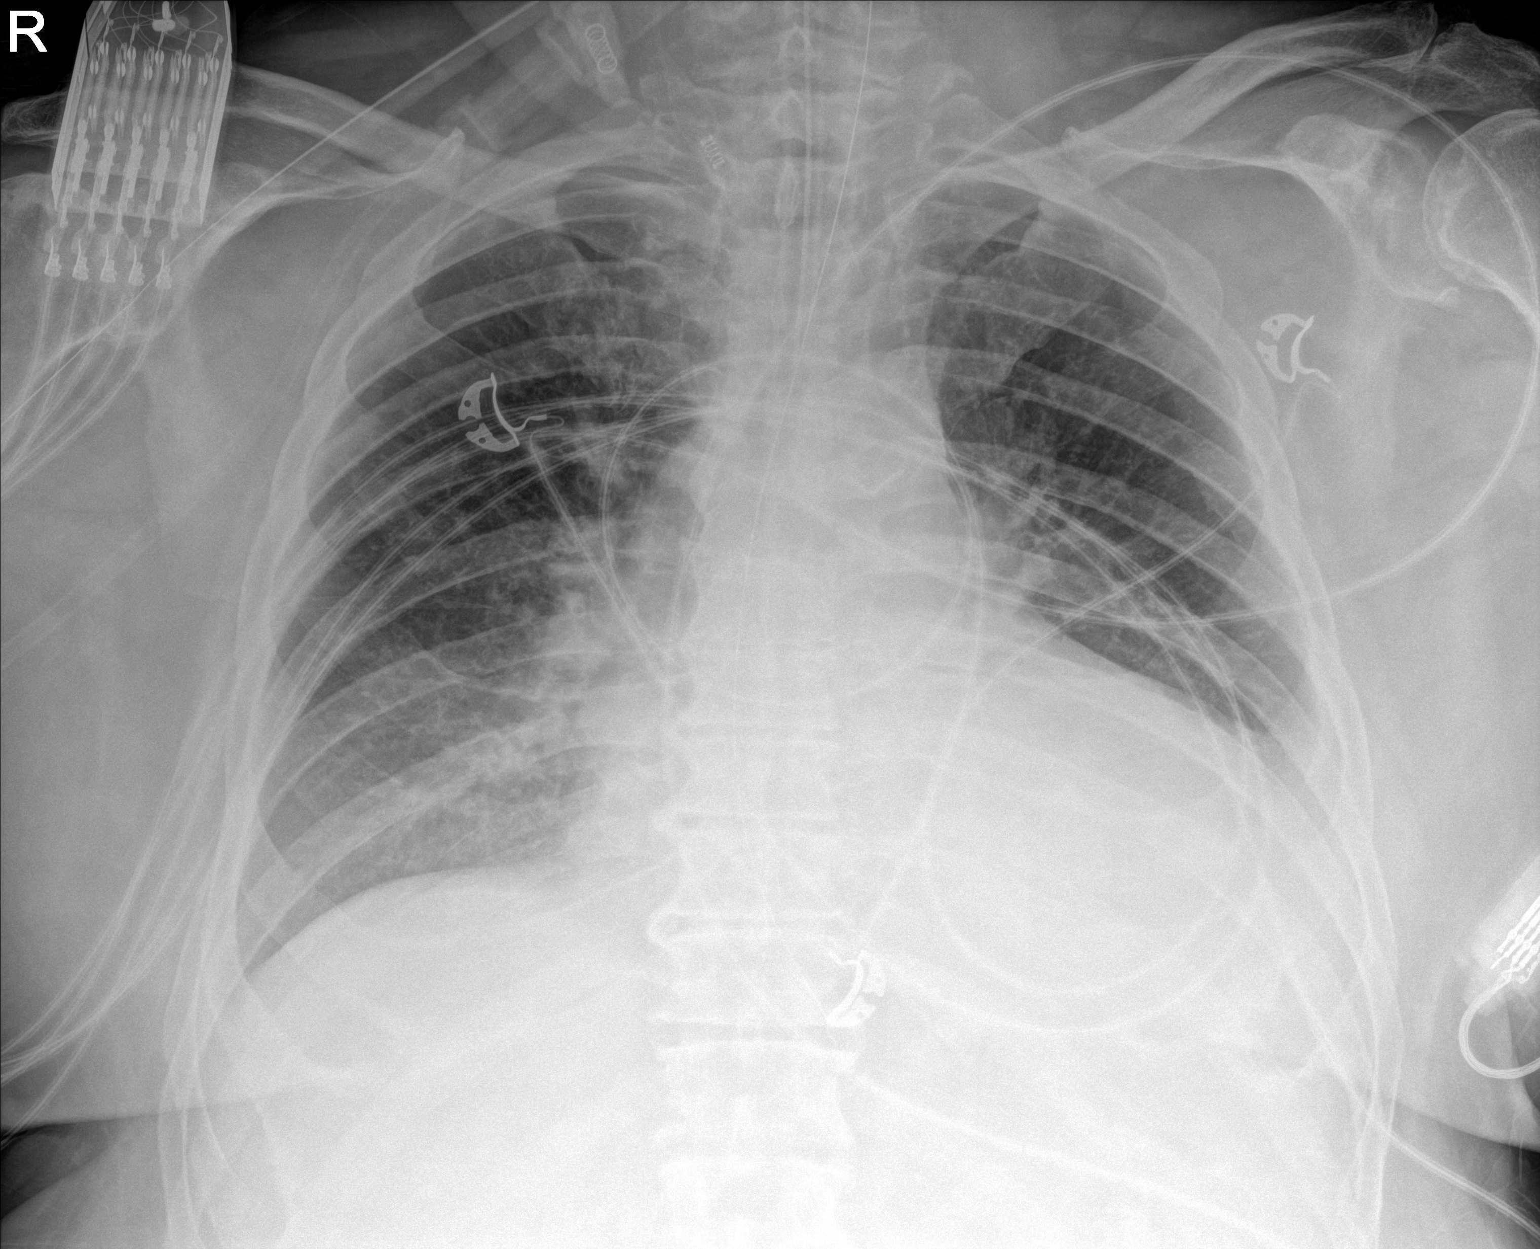

[1 of 1 positions shown; findings below may reference images not displayed]

FINDINGS: Endotracheal tube appears satisfactorily positioned between the
clavicles and carina. The enteric tube has been retracted with tip
overlying the proximal stomach and side hole overlying the lower
thoracic esophagus. The cardiac silhouette remains mildly enlarged.
There is decreased pulmonary edema. There is a persistent left
pleural effusion with left basilar atelectasis or consolidation. A
small veiling right pleural effusion is also suspected. No
pneumothorax is identified.
IMPRESSION: 1. Decreased pulmonary edema.
2. Persistent left pleural effusion with left basilar atelectasis or
consolidation. Possible small right pleural effusion.
3. Interval retraction of the enteric tube into the proximal stomach
with side hole in the esophagus. This could be advanced 8-10 cm to
place the side hole in the stomach if clinically desired.

## 2019-07-29 MED ORDER — CIPROFLOXACIN IN D5W 400 MG/200ML IV SOLN
400.0000 mg | Freq: Two times a day (BID) | INTRAVENOUS | Status: DC
  Administered 2019-07-29 – 2019-07-30 (×3): 400 mg via INTRAVENOUS
  Filled 2019-07-29 (×4): qty 200

## 2019-07-29 MED ORDER — PERFLUTREN LIPID MICROSPHERE
1.0000 mL | INTRAVENOUS | Status: AC | PRN
  Administered 2019-07-29: 11:00:00 5 mL via INTRAVENOUS
  Filled 2019-07-29: qty 10

## 2019-07-29 NOTE — Progress Notes (Signed)
Pharmacy Antibiotic Note  Antonio [name] is a 74 y.o. male admitted on 07/27/2019 with UTI.  Pharmacy has been consulted for Cipro dosing.  Plan: Cipro 400mg  IV q12h  Height: 5\' 7"  (170.2 cm) Weight: 253 lb 15.5 oz (115.2 kg) IBW/kg (Calculated) : 66.1  Temp (24hrs), Avg:98.2 F (36.8 C), Min:97.4 F (36.3 C), Max:98.9 F (37.2 C)  Recent Labs  Lab 07/27/19 2227 07/28/19 0220  WBC 19.5* 9.3  CREATININE 1.46* 1.46*  LATICACIDVEN >11.0* 1.5    Estimated Creatinine Clearance: 53.8 mL/min (A) (by C-G formula based on SCr of 1.46 mg/dL (H)).    Not on File  Antimicrobials this admission: Cipro 1/10 >>   Dose adjustments this admission:  Microbiology results:  Thank you for allowing pharmacy to be a part of this patient's care.  Vira Blanco 07/29/2019 3:56 AM

## 2019-07-29 NOTE — Progress Notes (Signed)
CRITICAL CARE NOTE Unknown Morbidly obese WM Per ems pt walked out to truck, collapsed on stretcher and became unresponsive. Pt never lost pulses but became apneic. Pt being bagged on ems arrival, sats 98%. HR 118 ST on monitor. Pt has unknown history.   Patient with severe resp distress Emergently intubated and sedated in ER  DX ACUTE SYSTOLIC HEART FAILURE   CC  follow up respiratory failure  SUBJECTIVE Patient remains critically ill Prognosis is guarded Remains on vent   BP (!) 132/42   Pulse 65   Temp 98.2 F (36.8 C) (Oral)   Resp (!) 26   Ht '5\' 7"'$  (1.702 m)   Wt 116 kg   SpO2 99%   BMI 40.05 kg/m    I/O last 3 completed shifts: In: 1500.4 [I.V.:791.1; IV Piggyback:709.3] Out: 1525 [Urine:1525] No intake/output data recorded.  SpO2: 99 % FiO2 (%): 28 %   SIGNIFICANT EVENTS 1/9 severe resp failure acute sCHF, intubated 1/8 1/10 SAT/SBT pending, +UTI continue ABX   REVIEW OF SYSTEMS  PATIENT IS UNABLE TO PROVIDE COMPLETE REVIEW OF SYSTEMS DUE TO SEVERE CRITICAL ILLNESS   PHYSICAL EXAMINATION:  GENERAL:critically ill appearing, +resp distress HEAD: Normocephalic, atraumatic.  EYES: Pupils equal, round, reactive to light.  No scleral icterus.  MOUTH: Moist mucosal membrane. NECK: Supple.  PULMONARY: +rhonchi, +wheezing CARDIOVASCULAR: S1 and S2. Regular rate and rhythm. No murmurs, rubs, or gallops.  GASTROINTESTINAL: Soft, nontender, -distended.  Positive bowel sounds.   MUSCULOSKELETAL: No swelling, clubbing, or edema.  NEUROLOGIC: obtunded, GCS<8 SKIN:intact,warm,dry  MEDICATIONS: I have reviewed all medications and confirmed regimen as documented   CULTURE RESULTS   Recent Results (from the past 240 hour(s))  Blood Culture (routine x 2)     Status: None (Preliminary result)   Collection Time: 07/27/19 10:45 PM   Specimen: BLOOD  Result Value Ref Range Status   Specimen Description BLOOD Blood Culture adequate volume  Final   Special  Requests   Final    BOTTLES DRAWN AEROBIC AND ANAEROBIC LEFT ANTECUBITAL   Culture   Final    NO GROWTH < 12 HOURS Performed at [facility], [address], [place], [place] [address]    Report Status PENDING  Incomplete  Respiratory Panel by RT PCR (Flu A&B, Covid) - Nasopharyngeal Swab     Status: None   Collection Time: 07/27/19 10:45 PM   Specimen: Nasopharyngeal Swab  Result Value Ref Range Status   SARS Coronavirus 2 by RT PCR NEGATIVE NEGATIVE Final    Comment: (NOTE) SARS-CoV-2 target nucleic acids are NOT DETECTED. The SARS-CoV-2 RNA is generally detectable in upper respiratoy specimens during the acute phase of infection. The lowest concentration of SARS-CoV-2 viral copies this assay can detect is 131 copies/mL. A negative result does not preclude SARS-Cov-2 infection and should not be used as the sole basis for treatment or other patient management decisions. A negative result may occur with  improper specimen collection/handling, submission of specimen other than nasopharyngeal swab, presence of viral mutation(s) within the areas targeted by this assay, and inadequate number of viral copies (<131 copies/mL). A negative result must be combined with clinical observations, patient history, and epidemiological information. The expected result is Negative. Fact Sheet for Patients:  PinkCheek.be Fact Sheet for Healthcare Providers:  GravelBags.it This test is not yet ap proved or cleared by the [place] FDA and  has been authorized for detection and/or diagnosis of SARS-CoV-2 by FDA under an Emergency Use Authorization (EUA). This EUA will remain  in effect (meaning this test can be used) for the duration of the COVID-19 declaration under Section 564(b)(1) of the Act, 21 U.S.C. section 360bbb-3(b)(1), unless the authorization is terminated or revoked sooner.    Influenza A by PCR NEGATIVE NEGATIVE  Final   Influenza B by PCR NEGATIVE NEGATIVE Final    Comment: (NOTE) The Xpert Xpress SARS-CoV-2/FLU/RSV assay is intended as an aid in  the diagnosis of influenza from Nasopharyngeal swab specimens and  should not be used as a sole basis for treatment. Nasal washings and  aspirates are unacceptable for Xpert Xpress SARS-CoV-2/FLU/RSV  testing. Fact Sheet for Patients: PinkCheek.be Fact Sheet for Healthcare Providers: GravelBags.it This test is not yet approved or cleared by the [place] FDA and  has been authorized for detection and/or diagnosis of SARS-CoV-2 by  FDA under an Emergency Use Authorization (EUA). This EUA will remain  in effect (meaning this test can be used) for the duration of the  Covid-19 declaration under Section 564(b)(1) of the Act, 21  U.S.C. section 360bbb-3(b)(1), unless the authorization is  terminated or revoked. Performed at [facility], [address] [place], [place] [address]   MRSA PCR Screening     Status: None   Collection Time: 07/28/19 11:07 AM   Specimen: Nasal Mucosa; Nasopharyngeal  Result Value Ref Range Status   MRSA by PCR NEGATIVE NEGATIVE Final    Comment:        The GeneXpert MRSA Assay (FDA approved for NASAL specimens only), is one component of a comprehensive MRSA colonization surveillance program. It is not intended to diagnose MRSA infection nor to guide or monitor treatment for MRSA infections. Performed at [facility], [address] [place], [place] [address]         CBC    Component Value Date/Time   WBC 10.9 (H) 07/29/2019 0422   RBC 3.94 (L) 07/29/2019 0422   HGB 12.5 (L) 07/29/2019 0422   HCT 38.3 (L) 07/29/2019 0422   PLT 221 07/29/2019 0422   MCV 97.2 07/29/2019 0422   MCH 31.7 07/29/2019 0422   MCHC 32.6 07/29/2019 0422   RDW 14.2 07/29/2019 0422   LYMPHSABS 10.5 (H) 07/27/2019 2227   MONOABS 1.3 (H) 07/27/2019 2227    EOSABS 0.4 07/27/2019 2227   BASOSABS 0.1 07/27/2019 2227   BMP Latest Ref Rng & Units 07/29/2019 07/28/2019 07/27/2019  Glucose 70 - 99 mg/dL 123(H) 121(H) 266(H)  BUN 8 - 23 mg/dL 24(H) 21 16  Creatinine 0.61 - 1.24 mg/dL 1.42(H) 1.46(H) 1.46(H)  Sodium 135 - 145 mmol/L 138 137 138  Potassium 3.5 - 5.1 mmol/L 3.7 4.6 4.0  Chloride 98 - 111 mmol/L 104 103 102  CO2 22 - 32 mmol/L 24 26 20(L)  Calcium 8.9 - 10.3 mg/dL 8.3(L) 8.6(L) 9.1        Indwelling Urinary Catheter continued, requirement due to   Reason to continue Indwelling Urinary Catheter strict Intake/Output monitoring for hemodynamic instability   Central Line/ continued, requirement due to  Reason to continue [place] of central venous pressure or other hemodynamic parameters and poor IV access   Ventilator continued, requirement due to severe respiratory failure   Ventilator Sedation RASS 0 to -2      ASSESSMENT AND PLAN SYNOPSIS 73  Morbidly obese WM  Patient with severe resp distress and severe hypoxic resp failure Emergently intubated and sedated in ER fro acute systolic CHF with underlying OSA/OHS  Severe ACUTE Hypoxic and Hypercapnic Respiratory Failure -continue  Full MV support -continue Bronchodilator Therapy -Wean Fio2 and PEEP as tolerated -will perform SAT/SBT when respiratory parameters are met  ACUTE SYSTOLIC CARDIAC FAILURE- EF ? ECHO pending -oxygen as needed -Lasix as tolerated    ACUTE KIDNEY INJURY/Renal Failure -follow chem 7 -follow UO -continue Foley Catheter-assess need -Avoid nephrotoxic agents -Recheck creatinine     NEUROLOGY - intubated and sedated - minimal sedation to achieve a RASS goal: -1 Wake up assessment pending   CARDIAC ICU monitoring  ID -continue IV abx as prescibed for UTI -follow up cultures  GI GI PROPHYLAXIS as indicated  NUTRITIONAL STATUS DIET-->TF's as tolerated Constipation protocol as indicated  ENDO - will use ICU  hypoglycemic\Hyperglycemia protocol if indicated   ELECTROLYTES -follow labs as needed -replace as needed -pharmacy consultation and following   DVT/GI PRX ordered TRANSFUSIONS AS NEEDED MONITOR FSBS ASSESS the need for LABS as needed   Critical Care Time devoted to patient care services described in this note is 31 minutes.   Overall, patient is critically ill, prognosis is guarded.  Patient with Multiorgan failure and at high risk for cardiac arrest and death.    Corrin Parker, M.D.  [facility]  Medical Director [place] Director [facility] Cardio-Pulmonary Department

## 2019-07-29 NOTE — Progress Notes (Signed)
*  PRELIMINARY RESULTS* Echocardiogram 2D Echocardiogram has been performed. Definity IV Contrast used on this study.  Antonio [name] Antonio [name] 07/29/2019, 10:31 AM

## 2019-07-29 NOTE — Progress Notes (Deleted)
Cooling blanket off. Core Temp 99.2

## 2019-07-30 ENCOUNTER — Inpatient Hospital Stay: Payer: Medicare Other

## 2019-07-30 LAB — CBC WITH DIFFERENTIAL/PLATELET
Abs Immature Granulocytes: 0.05 10*3/uL (ref 0.00–0.07)
Basophils Absolute: 0.1 10*3/uL (ref 0.0–0.1)
Basophils Relative: 1 %
Eosinophils Absolute: 0.1 10*3/uL (ref 0.0–0.5)
Eosinophils Relative: 2 %
HCT: 37.6 % — ABNORMAL LOW (ref 39.0–52.0)
Hemoglobin: 12 g/dL — ABNORMAL LOW (ref 13.0–17.0)
Immature Granulocytes: 1 %
Lymphocytes Relative: 20 %
Lymphs Abs: 1.8 10*3/uL (ref 0.7–4.0)
MCH: 31.1 pg (ref 26.0–34.0)
MCHC: 31.9 g/dL (ref 30.0–36.0)
MCV: 97.4 fL (ref 80.0–100.0)
Monocytes Absolute: 0.9 10*3/uL (ref 0.1–1.0)
Monocytes Relative: 10 %
Neutro Abs: 6 10*3/uL (ref 1.7–7.7)
Neutrophils Relative %: 66 %
Platelets: 217 10*3/uL (ref 150–400)
RBC: 3.86 MIL/uL — ABNORMAL LOW (ref 4.22–5.81)
RDW: 14.4 % (ref 11.5–15.5)
WBC: 9 10*3/uL (ref 4.0–10.5)
nRBC: 0 % (ref 0.0–0.2)

## 2019-07-30 LAB — BASIC METABOLIC PANEL
Anion gap: 8 (ref 5–15)
BUN: 21 mg/dL (ref 8–23)
CO2: 26 mmol/L (ref 22–32)
Calcium: 8.3 mg/dL — ABNORMAL LOW (ref 8.9–10.3)
Chloride: 105 mmol/L (ref 98–111)
Creatinine, Ser: 1.36 mg/dL — ABNORMAL HIGH (ref 0.61–1.24)
GFR calc Af Amer: 59 mL/min — ABNORMAL LOW (ref >60)
GFR calc non Af Amer: 51 mL/min — ABNORMAL LOW (ref >60)
Glucose, Bld: 94 mg/dL (ref 70–99)
Potassium: 3.9 mmol/L (ref 3.5–5.1)
Sodium: 139 mmol/L (ref 135–145)

## 2019-07-30 LAB — URINE CULTURE: Culture: NO GROWTH

## 2019-07-30 LAB — PATHOLOGIST SMEAR REVIEW

## 2019-07-30 LAB — MAGNESIUM: Magnesium: 2.3 mg/dL (ref 1.7–2.4)

## 2019-07-30 LAB — PHOSPHORUS: Phosphorus: 3 mg/dL (ref 2.5–4.6)

## 2019-07-30 LAB — GLUCOSE, CAPILLARY: Glucose-Capillary: 107 mg/dL — ABNORMAL HIGH (ref 70–99)

## 2019-07-30 IMAGING — DX DG ABDOMEN 1V
1 series · 1 of 1 positions shown · non-contrast
Comparison: Abdominal radiograph dated [DATE].

CLINICAL DATA: 73-year-old male with NG placement.

EXAM:
ABDOMEN - 1 VIEW

[abdomen supine]
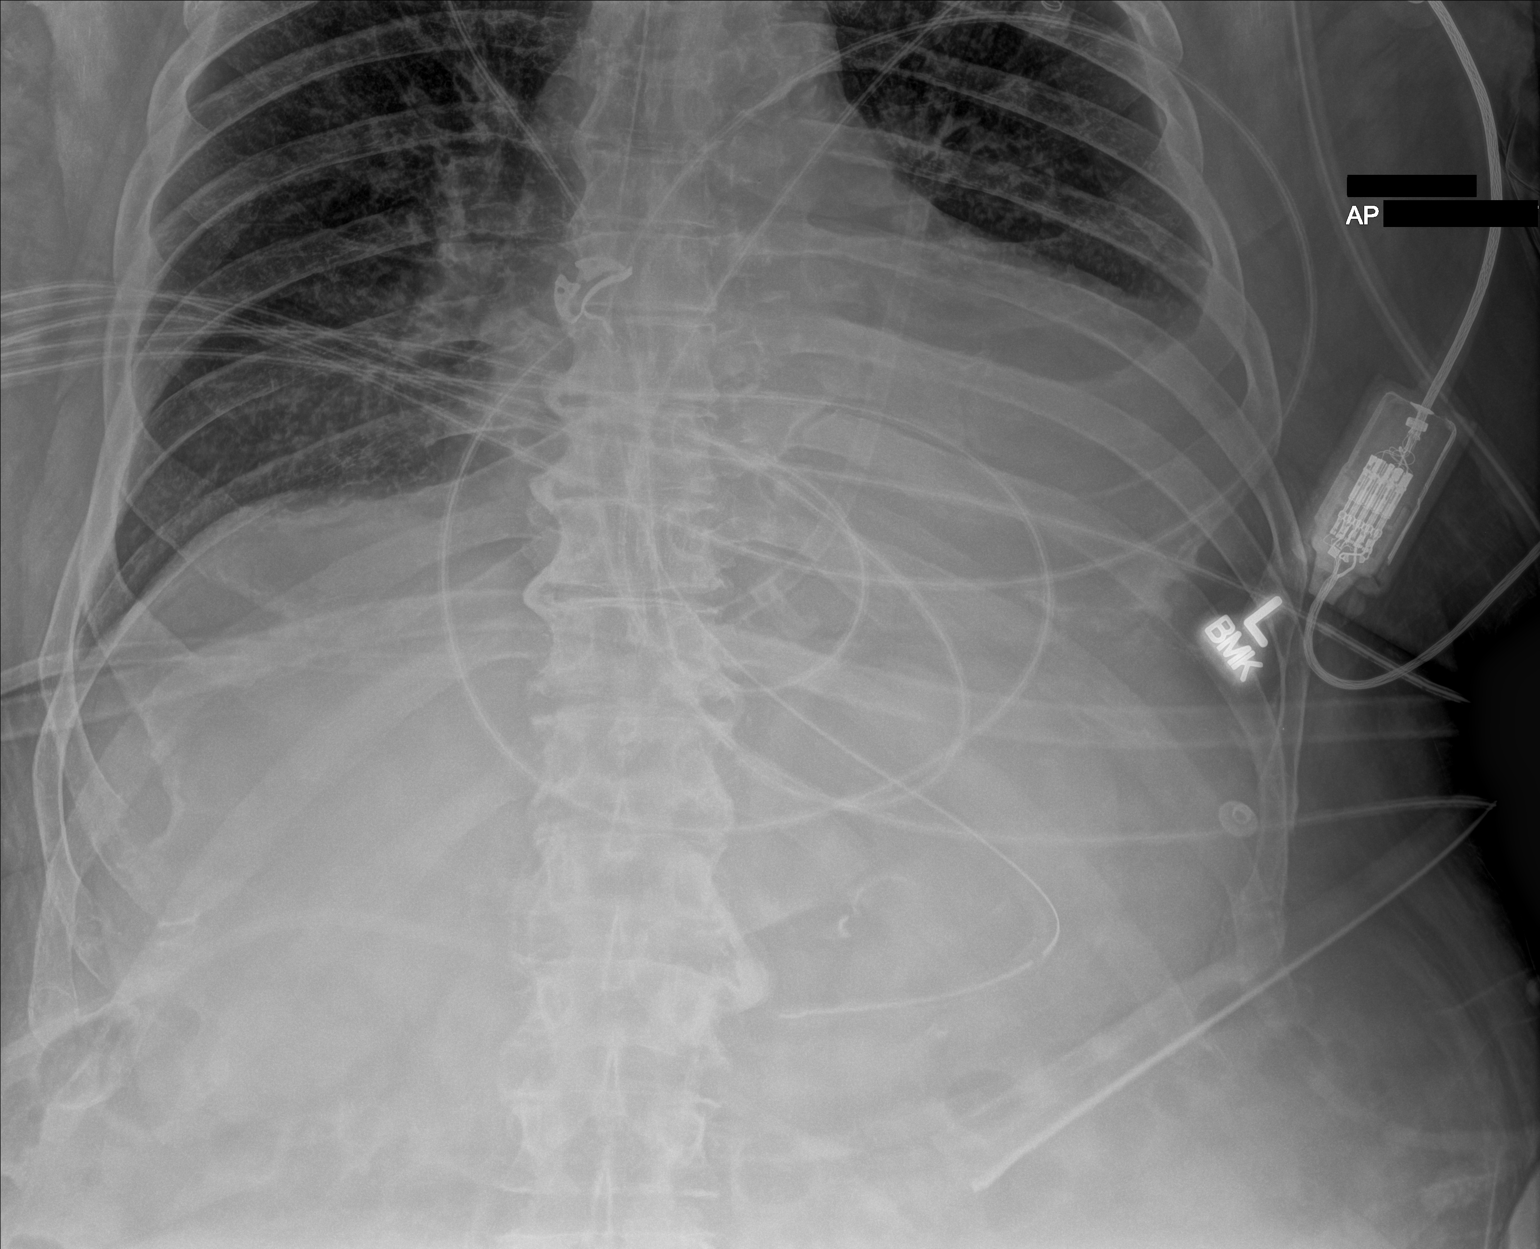

[1 of 1 positions shown; findings below may reference images not displayed]

FINDINGS: Partially visualized enteric tube with tip and side-port in the body
of the stomach.

Small left and possible trace right pleural effusion. Left lung base
atelectasis or infiltrate.

Degenerative changes of the spine.  No acute osseous pathology.
IMPRESSION: 1. Enteric tube with tip in the body of the stomach.
2. Small left and probable trace right pleural effusion. Left lung
base atelectasis or infiltrate.

## 2019-07-30 MED ORDER — FAMOTIDINE 20 MG PO TABS
20.0000 mg | ORAL_TABLET | Freq: Two times a day (BID) | ORAL | Status: DC
  Administered 2019-07-30 – 2019-08-11 (×25): 20 mg
  Filled 2019-07-30 (×24): qty 1

## 2019-07-30 MED ORDER — FENTANYL 2500MCG IN NS 250ML (10MCG/ML) PREMIX INFUSION
0.0000 ug/h | INTRAVENOUS | Status: DC
  Administered 2019-07-31: 17:00:00 300 ug/h via INTRAVENOUS
  Administered 2019-07-31: 01:00:00 150 ug/h via INTRAVENOUS
  Administered 2019-08-01 (×3): 300 ug/h via INTRAVENOUS
  Administered 2019-08-02 (×2): 250 ug/h via INTRAVENOUS
  Administered 2019-08-02: 04:00:00 300 ug/h via INTRAVENOUS
  Administered 2019-08-03: 18:00:00 400 ug/h via INTRAVENOUS
  Administered 2019-08-03: 23:00:00 375 ug/h via INTRAVENOUS
  Administered 2019-08-03: 11:00:00 250 ug/h via INTRAVENOUS
  Administered 2019-08-04: 300 ug/h via INTRAVENOUS
  Administered 2019-08-05: 100 ug/h via INTRAVENOUS
  Filled 2019-07-30 (×13): qty 250

## 2019-07-30 MED ORDER — SENNOSIDES-DOCUSATE SODIUM 8.6-50 MG PO TABS
2.0000 | ORAL_TABLET | Freq: Two times a day (BID) | ORAL | Status: DC
  Administered 2019-07-30 – 2019-08-01 (×5): 2
  Filled 2019-07-30 (×5): qty 2

## 2019-07-30 MED ORDER — VITAL HIGH PROTEIN PO LIQD
1000.0000 mL | ORAL | Status: DC
  Administered 2019-07-30 – 2019-08-07 (×8): 1000 mL

## 2019-07-30 MED ORDER — SODIUM CHLORIDE 0.9 % IV SOLN
1.0000 g | Freq: Every day | INTRAVENOUS | Status: DC
  Administered 2019-07-30 – 2019-07-31 (×2): 1 g via INTRAVENOUS
  Filled 2019-07-30 (×2): qty 1

## 2019-07-30 MED ORDER — PRO-STAT SUGAR FREE PO LIQD
60.0000 mL | Freq: Three times a day (TID) | ORAL | Status: DC
  Administered 2019-07-30 – 2019-08-07 (×23): 60 mL

## 2019-07-30 MED ORDER — FENTANYL 2500MCG IN NS 250ML (10MCG/ML) PREMIX INFUSION
INTRAVENOUS | Status: AC
  Administered 2019-07-30: 150 ug/h via INTRAVENOUS
  Filled 2019-07-30: qty 250

## 2019-07-30 NOTE — Progress Notes (Signed)
Initial Nutrition Assessment  DOCUMENTATION CODES:   Obesity unspecified  INTERVENTION:  Initiate Vital High Protein at 20 mL/hr (480 mL goal daily volume) + Pro-Stat 60 mL TID per tube. Provides 1080 kcal, 132 grams of protein, 403 mL H2O daily.  Provide liquid MVI daily per tube.  Provide minimum free water flush of 20-30 mL Q4hrs to maintain tube patency.  NUTRITION DIAGNOSIS:   Inadequate oral intake related to inability to eat as evidenced by NPO status.  GOAL:   Provide needs based on ASPEN/SCCM guidelines  MONITOR:   Vent status, Labs, Weight trends, TF tolerance, Skin, I & O's  REASON FOR ASSESSMENT:   Ventilator    ASSESSMENT:   74 year old male admitted with acute hypoxic and hypercapnic respiratory failure from acute systolic/diastolic heart failure requiring intubation on 1/8, also with sepsis and UTI.   Patient is currently intubated on ventilator support MV: 10 L/min Temp (24hrs), Avg:98.5 F (36.9 C), Min:98.1 F (36.7 C), Max:98.7 F (37.1 C)  Propofol: 27.2 ml/hr (718 kcal daily)  Medications reviewed and include: famotidine, ceftriaxone, fentanyl gtt, propofol gtt.  Labs reviewed: Creatinine 1.36.  Enteral Access: OGT  Patient does not meet criteria for malnutrition at this time.  Discussed on rounds. Plan is to start tube feeds today.  NUTRITION - FOCUSED PHYSICAL EXAM:    Most Recent Value  Orbital Region  No depletion  Upper Arm Region  No depletion  Thoracic and Lumbar Region  No depletion  Buccal Region  Unable to assess  Temple Region  No depletion  Clavicle Bone Region  No depletion  Clavicle and Acromion Bone Region  No depletion  Scapular Bone Region  Unable to assess  Dorsal Hand  No depletion  Patellar Region  No depletion  Anterior Thigh Region  No depletion  Posterior Calf Region  No depletion  Edema (RD Assessment)  None  Hair  Reviewed  Eyes  Unable to assess  Mouth  Unable to assess  Skin  Reviewed  Nails   Reviewed     Diet Order:   Diet Order            Diet NPO time specified  Diet effective now             EDUCATION NEEDS:   No education needs have been identified at this time  Skin:  Skin Assessment: Skin Integrity Issues:(MSAD to breasts)  Last BM:  07/28/2019 per chart  Height:   Ht Readings from Last 1 Encounters:  07/28/19 5\' 7"  (1.702 m)   Weight:   Wt Readings from Last 1 Encounters:  07/30/19 115 kg   Ideal Body Weight:  67.3 kg  BMI:  Body mass index is 39.71 kg/m.  Estimated Nutritional Needs:   Kcal:  1265-1610 (11-14 kcal/kg)  Protein:  135 grams (2 grams/kg IBW)  Fluid:  1.7-2 L/day  Jacklynn Barnacle, MS, RD, LDN Office: [phone] Pager: [phone] After Hours/Weekend Pager: [phone]

## 2019-07-30 NOTE — Progress Notes (Signed)
CRITICAL CARE NOTE Morbidly obese WMPer ems pt walked out to truck, collapsed on stretcher and became unresponsive. Pt never lost pulses but became apneic. Pt being bagged on ems arrival, sats 98%. HR 118 ST on monitor. Pt has unknown history.  Patient with severe resp distress Emergently intubated and sedated in ER  DX ACUTE DIASTOLIC/SYSTOLIC HEART FAILURE  CC  follow up respiratory failure  SUBJECTIVE Patient remains critically ill Prognosis is guarded Failed multiple weaning trials  BP (!) 127/47   Pulse 62   Temp 98.1 F (36.7 C) (Axillary)   Resp 20   Ht 5\' 7"  (1.702 m)   Wt 115 kg   SpO2 95%   BMI 39.71 kg/m    I/O last 3 completed shifts: In: 1631.8 [I.V.:1325.6; IV Piggyback:306.1] Out: [id number] [Urine:1825] No intake/output data recorded.  SpO2: 95 % FiO2 (%): 30 %   SIGNIFICANT EVENTS 1/9 severe resp failure acute sCHF, intubated 1/8 1/10 SAT/SBT pending, +UTI continue ABX  REVIEW OF SYSTEMS  PATIENT IS UNABLE TO PROVIDE COMPLETE REVIEW OF SYSTEMS DUE TO SEVERE CRITICAL ILLNESS   PHYSICAL EXAMINATION:  GENERAL:critically ill appearing, +resp distress HEAD: Normocephalic, atraumatic.  EYES: Pupils equal, round, reactive to light.  No scleral icterus.  MOUTH: Moist mucosal membrane. NECK: Supple.  PULMONARY: +rhonchi, +wheezing CARDIOVASCULAR: S1 and S2. Regular rate and rhythm. No murmurs, rubs, or gallops.  GASTROINTESTINAL: Soft, nontender, -distended.  Positive bowel sounds.   MUSCULOSKELETAL: No swelling, clubbing, or edema.  NEUROLOGIC: obtunded, GCS<8 SKIN:intact,warm,dry  MEDICATIONS: I have reviewed all medications and confirmed regimen as documented   CULTURE RESULTS   Recent Results (from the past 240 hour(s))  Blood Culture (routine x 2)     Status: None (Preliminary result)   Collection Time: 07/27/19 10:45 PM   Specimen: BLOOD  Result Value Ref Range Status   Specimen Description BLOOD Blood Culture adequate volume  Final    Special Requests   Final    BOTTLES DRAWN AEROBIC AND ANAEROBIC LEFT ANTECUBITAL   Culture   Final    NO GROWTH 3 DAYS Performed at [facility], [address], [place], [place] [address]    Report Status PENDING  Incomplete  Respiratory Panel by RT PCR (Flu A&B, Covid) - Nasopharyngeal Swab     Status: None   Collection Time: 07/27/19 10:45 PM   Specimen: Nasopharyngeal Swab  Result Value Ref Range Status   SARS Coronavirus 2 by RT PCR NEGATIVE NEGATIVE Final    Comment: (NOTE) SARS-CoV-2 target nucleic acids are NOT DETECTED. The SARS-CoV-2 RNA is generally detectable in upper respiratoy specimens during the acute phase of infection. The lowest concentration of SARS-CoV-2 viral copies this assay can detect is 131 copies/mL. A negative result does not preclude SARS-Cov-2 infection and should not be used as the sole basis for treatment or other patient management decisions. A negative result may occur with  improper specimen collection/handling, submission of specimen other than nasopharyngeal swab, presence of viral mutation(s) within the areas targeted by this assay, and inadequate number of viral copies (<131 copies/mL). A negative result must be combined with clinical observations, patient history, and epidemiological information. The expected result is Negative. Fact Sheet for Patients:  PinkCheek.be Fact Sheet for Healthcare Providers:  GravelBags.it This test is not yet ap proved or cleared by the [place] FDA and  has been authorized for detection and/or diagnosis of SARS-CoV-2 by FDA under an Emergency Use Authorization (EUA). This EUA will remain  in effect (meaning this test can  be used) for the duration of the COVID-19 declaration under Section 564(b)(1) of the Act, 21 U.S.C. section 360bbb-3(b)(1), unless the authorization is terminated or revoked sooner.    Influenza A by PCR NEGATIVE NEGATIVE  Final   Influenza B by PCR NEGATIVE NEGATIVE Final    Comment: (NOTE) The Xpert Xpress SARS-CoV-2/FLU/RSV assay is intended as an aid in  the diagnosis of influenza from Nasopharyngeal swab specimens and  should not be used as a sole basis for treatment. Nasal washings and  aspirates are unacceptable for Xpert Xpress SARS-CoV-2/FLU/RSV  testing. Fact Sheet for Patients: PinkCheek.be Fact Sheet for Healthcare Providers: GravelBags.it This test is not yet approved or cleared by the [place] FDA and  has been authorized for detection and/or diagnosis of SARS-CoV-2 by  FDA under an Emergency Use Authorization (EUA). This EUA will remain  in effect (meaning this test can be used) for the duration of the  Covid-19 declaration under Section 564(b)(1) of the Act, 21  U.S.C. section 360bbb-3(b)(1), unless the authorization is  terminated or revoked. Performed at [facility], [address], [place], [place] [address]   Urine Culture     Status: None   Collection Time: 07/27/19 10:45 PM   Specimen: Urine, Random  Result Value Ref Range Status   Specimen Description   Final    URINE, RANDOM Performed at [facility], [address], [place], [place] [address]    Special Requests   Final    NONE Performed at [facility], [address] [place], [place] [address]    Culture   Final    NO GROWTH Performed at [facility], [place] [address], [place], [place] [address]    Report Status 07/29/2019 FINAL  Final  MRSA PCR Screening     Status: None   Collection Time: 07/28/19 11:07 AM   Specimen: Nasal Mucosa; Nasopharyngeal  Result Value Ref Range Status   MRSA by PCR NEGATIVE NEGATIVE Final    Comment:        The GeneXpert MRSA Assay (FDA approved for NASAL specimens only), is one component of a comprehensive MRSA colonization surveillance program. It is not intended to diagnose  MRSA infection nor to guide or monitor treatment for MRSA infections. Performed at [facility], [address] [place], [place] [address]   Urine Culture     Status: None   Collection Time: 07/28/19  5:15 PM   Specimen: Urine, Random  Result Value Ref Range Status   Specimen Description   Final    URINE, RANDOM Performed at [facility], [address], [place], [place] [address]    Special Requests   Final    NONE Performed at [facility], [address] [place], [place] [address]    Culture   Final    NO GROWTH Performed at [facility], [place] [address] [place], [place] [address]    Report Status 07/30/2019 FINAL  Final        CBC    Component Value Date/Time   WBC 9.0 07/30/2019 0624   RBC 3.86 (L) 07/30/2019 0624   HGB 12.0 (L) 07/30/2019 0624   HCT 37.6 (L) 07/30/2019 0624   PLT 217 07/30/2019 0624   MCV 97.4 07/30/2019 0624   MCH 31.1 07/30/2019 0624   MCHC 31.9 07/30/2019 0624   RDW 14.4 07/30/2019 0624   LYMPHSABS 1.8 07/30/2019 0624   MONOABS 0.9 07/30/2019 0624   EOSABS 0.1 07/30/2019  [id number]   BASOSABS 0.1 07/30/2019 0624   BMP Latest Ref Rng & Units 07/30/2019 07/29/2019 07/28/2019  Glucose 70 - 99 mg/dL 94 123(H) 121(H)  BUN 8 - 23 mg/dL 21 24(H) 21  Creatinine 0.61 - 1.24 mg/dL 1.36(H) 1.42(H) 1.46(H)  Sodium 135 - 145 mmol/L 139 138 137  Potassium 3.5 - 5.1 mmol/L 3.9 3.7 4.6  Chloride 98 - 111 mmol/L 105 104 103  CO2 22 - 32 mmol/L 26 24 26   Calcium 8.9 - 10.3 mg/dL 8.3(L) 8.3(L) 8.6(L)      Indwelling Urinary Catheter continued, requirement due to   Reason to continue Indwelling Urinary Catheter strict Intake/Output monitoring for hemodynamic instability         Ventilator continued, requirement due to severe respiratory failure   Ventilator Sedation RASS 0 to -2      ASSESSMENT AND PLAN SYNOPSIS    Severe ACUTE Hypoxic and Hypercapnic Respiratory Failure from acute cardiac  failure-systolic/diastolic heart failure with UTI/sepsis  -continue Full MV support -continue Bronchodilator Therapy -Wean Fio2 and PEEP as tolerated Has failed multiple vent weaning trials  ACUTE DIASTOLIC/SYSTOLIC CARDIAC FAILURE-  -oxygen as needed -Lasix as tolerated    ACUTE KIDNEY INJURY/Renal Failure -follow chem 7 -follow UO -continue Foley Catheter-assess need -Avoid nephrotoxic agents -Recheck creatinine    NEUROLOGY - intubated and sedated - minimal sedation to achieve a RASS goal: -1    CARDIAC ICU monitoring  ID -continue IV abx as prescibed -follow up cultures  GI GI PROPHYLAXIS as indicated  NUTRITIONAL STATUS DIET-->TF's as tolerated Constipation protocol as indicated  ENDO - will use ICU hypoglycemic\Hyperglycemia protocol if indicated   ELECTROLYTES -follow labs as needed -replace as needed -pharmacy consultation and following   DVT/GI PRX ordered TRANSFUSIONS AS NEEDED MONITOR FSBS ASSESS the need for LABS as needed   Critical Care Time devoted to patient care services described in this note is 35 minutes.   Overall, patient is critically ill, prognosis is guarded.  Patient with Multiorgan failure and at high risk for cardiac arrest and death.   WILL NEED TO CASE MANAGEMENT FOR ASSISTANCE TO FIND FAMILY   Andrian Sabala Patricia Pesa, M.D.  [facility]  Medical Director [employer] Director [facility] Cardio-Pulmonary Department

## 2019-07-31 ENCOUNTER — Encounter: Payer: Self-pay | Admitting: Internal Medicine

## 2019-07-31 LAB — CBC
HCT: 37.7 % — ABNORMAL LOW (ref 39.0–52.0)
Hemoglobin: 12.6 g/dL — ABNORMAL LOW (ref 13.0–17.0)
MCH: 32.1 pg (ref 26.0–34.0)
MCHC: 33.4 g/dL (ref 30.0–36.0)
MCV: 95.9 fL (ref 80.0–100.0)
Platelets: 212 10*3/uL (ref 150–400)
RBC: 3.93 MIL/uL — ABNORMAL LOW (ref 4.22–5.81)
RDW: 14.1 % (ref 11.5–15.5)
WBC: 7.3 10*3/uL (ref 4.0–10.5)
nRBC: 0 % (ref 0.0–0.2)

## 2019-07-31 LAB — MAGNESIUM: Magnesium: 2.4 mg/dL (ref 1.7–2.4)

## 2019-07-31 LAB — BASIC METABOLIC PANEL
Anion gap: 5 (ref 5–15)
BUN: 31 mg/dL — ABNORMAL HIGH (ref 8–23)
CO2: 28 mmol/L (ref 22–32)
Calcium: 8.5 mg/dL — ABNORMAL LOW (ref 8.9–10.3)
Chloride: 106 mmol/L (ref 98–111)
Creatinine, Ser: 1.22 mg/dL (ref 0.61–1.24)
GFR calc Af Amer: >60 mL/min (ref >60)
GFR calc non Af Amer: 58 mL/min — ABNORMAL LOW (ref >60)
Glucose, Bld: 110 mg/dL — ABNORMAL HIGH (ref 70–99)
Potassium: 3.7 mmol/L (ref 3.5–5.1)
Sodium: 139 mmol/L (ref 135–145)

## 2019-07-31 LAB — GLUCOSE, CAPILLARY
Glucose-Capillary: 111 mg/dL — ABNORMAL HIGH (ref 70–99)
Glucose-Capillary: 136 mg/dL — ABNORMAL HIGH (ref 70–99)
Glucose-Capillary: 137 mg/dL — ABNORMAL HIGH (ref 70–99)
Glucose-Capillary: 96 mg/dL (ref 70–99)
Glucose-Capillary: 97 mg/dL (ref 70–99)
Glucose-Capillary: 98 mg/dL (ref 70–99)
Glucose-Capillary: 99 mg/dL (ref 70–99)

## 2019-07-31 LAB — PROCALCITONIN: Procalcitonin: 0.32 ng/mL

## 2019-07-31 LAB — PHOSPHORUS: Phosphorus: 3.1 mg/dL (ref 2.5–4.6)

## 2019-07-31 MED ORDER — SODIUM CHLORIDE 0.9 % IV SOLN
3.0000 g | Freq: Four times a day (QID) | INTRAVENOUS | Status: AC
  Administered 2019-08-01 – 2019-08-03 (×8): 3 g via INTRAVENOUS
  Filled 2019-07-31 (×8): qty 3

## 2019-07-31 MED ORDER — CHLORHEXIDINE GLUCONATE CLOTH 2 % EX PADS
6.0000 | MEDICATED_PAD | Freq: Every day | CUTANEOUS | Status: DC
  Administered 2019-07-31 – 2019-08-16 (×15): 6 via TOPICAL

## 2019-07-31 MED ORDER — POTASSIUM CHLORIDE 20 MEQ PO PACK
40.0000 meq | PACK | Freq: Once | ORAL | Status: AC
  Administered 2019-07-31: 40 meq
  Filled 2019-07-31: qty 2

## 2019-07-31 MED ORDER — POLYETHYLENE GLYCOL 3350 17 G PO PACK
17.0000 g | PACK | Freq: Every day | ORAL | Status: DC
  Administered 2019-07-31 – 2019-08-01 (×2): 17 g
  Filled 2019-07-31 (×2): qty 1

## 2019-07-31 MED ORDER — METHYLPREDNISOLONE SODIUM SUCC 40 MG IJ SOLR
20.0000 mg | Freq: Two times a day (BID) | INTRAMUSCULAR | Status: DC
  Administered 2019-07-31 – 2019-08-03 (×7): 20 mg via INTRAVENOUS
  Filled 2019-07-31 (×7): qty 1

## 2019-07-31 MED ORDER — FUROSEMIDE 10 MG/ML IJ SOLN
40.0000 mg | Freq: Once | INTRAMUSCULAR | Status: AC
  Administered 2019-07-31: 40 mg via INTRAVENOUS
  Filled 2019-07-31: qty 4

## 2019-07-31 NOTE — Progress Notes (Signed)
Pharmacy Monitoring Consult:  Pharmacy consulted to assist in monitoring and replacing electrolytes in this 74 y.o. male admitted on 07/27/2019 via EMS. Patient with AKI and respiratory failure. Patient admitted to ICU requiring mechanical ventilation and continuous fentanyl/propofol infusion.   Labs:  Sodium (mmol/L)  Date Value  07/31/2019 139   Potassium (mmol/L)  Date Value  07/31/2019 3.7   Magnesium (mg/dL)  Date Value  07/31/2019 2.4   Phosphorus (mg/dL)  Date Value  07/31/2019 3.1   Calcium (mg/dL)  Date Value  07/31/2019 8.5 (L)   Albumin (g/dL)  Date Value  07/27/2019 3.9    Assessment/Plan: 1. Electrolytes: Will order potassium 32mEq VT x 1. Patient received furosemide 40mg  IV x 1 this am. Will obtain follow up labs in am. Will replace to maintain potasium ~ 4 and magnesium ~ 2 while patient is intubated.   2. Glucose: patient with Q4h CBGs. Glucose staying between 97-110 over past 24 hours. Will continue to monitor.   3. Constipation: Patient remains on continuous fentanyl infusion. Last documented bowel movement 1/9. Will continue senna/docusate 2 tabs VT BID. Will add Miralax VT Daily.   Pharmacy will continue to monitor and adjust per consult.   Bionca Mckey L 07/31/2019 2:44 PM

## 2019-07-31 NOTE — Progress Notes (Signed)
Pharmacy Antibiotic Note  Antonio [name] is a morbidly obese 74 y.o. male admitted on 07/27/2019. EMS stated pt walked to truck, collapsed on stretcher, and became unresponsive. Pt was apneic but still with pulse. Upon ER admission, pt had been intubated and sedated. Pt has acute kidney injury. Pt has severe respiratory distress and acute respiratory failure. UTI was suspected, but UCx showed no growth. Pharmacy has been consulted for Unasyn dosing for possible CAP/Aspiration PNA.   Plan:  D/C Ceftriaxone IV 1g in sodium chloride 0.9% 100 mL (last dose given (0930 on 07/31/19) Initiate Unasyn IV 3g Q6H at 1000 on 1/13 for total antibiotic duration of 5 days.   Height: 5\' 7"  (170.2 cm) Weight: 256 lb 9.9 oz (116.4 kg) IBW/kg (Calculated) : 66.1  Temp (24hrs), Avg:98.8 F (37.1 C), Min:98.2 F (36.8 C), Max:99.6 F (37.6 C)  Recent Labs  Lab 07/27/19 2227 07/28/19 0220 07/29/19 0422 07/30/19 0624 07/31/19 0444  WBC 19.5* 9.3 10.9* 9.0 7.3  CREATININE 1.46* 1.46* 1.42* 1.36* 1.22  LATICACIDVEN >11.0* 1.5  --   --   --     Estimated Creatinine Clearance: 64.8 mL/min (by C-G formula based on SCr of 1.22 mg/dL).    Not on File  Antimicrobials this admission: Ciprofloxacin 400mg  IV 1/10 >> 1/11 Ceftriaxone 1g IV 01/11 >> 01/12  Unasyn 3g IV 01/13 >> 1/14  Dose adjustments this admission: No dose adjustments necessary   Microbiology results: 07/27/19 BCx No growth X 4 days 07/27/19 UCx No growth  07/28/19 UCx No growth  07/30/19 Sputum Abundant WBC present, both PMN and mononuclear, rare gram positive cocci, rare gram variable rod; culture re-incubated for better growth   07/28/19 MRSA PCR: Negative 07/27/19 SARS Coronavirus 2 PCR: Negative 07/27/19 Influenza A and B PCR: Negative     Thank you for allowing pharmacy to be a part of this patient's care.  Roanna Banning 07/31/2019 11:16 AM

## 2019-07-31 NOTE — Progress Notes (Addendum)
CRITICAL CARE PROGRESS NOTE    Name: Antonio [name] MRN: [id number] DOB: July 05, 1946     LOS: 3   SUBJECTIVE FINDINGS & SIGNIFICANT EVENTS   Patient description:  Antonio [name] is a 74 yo male who was found down by EMS on 07/27/2019.  On workup, he was found to have a urinary tract infection, grade I diastolic dysfunction, and pulmonary edema/pleural effusion.  He was also found to have an acute kidney injury.  He was admitted to the ICU with acute respiratory failure and intubated and sedated.  Lines / Drains: PIV x2: left antecubital, right antecubital Urethral catheter NG/OG tube ET tube on vent x 3 days  Cultures / Sepsis markers: Afebrile over past 24 hours (98.77F this morning), bradycardia ~60 bpm.  Normotensive.  WBC within normal range at 7.3 today.  Procalcitonin 0.32 today. Respiratory culture demonstrated rare gram positive cocci and rare gram variable rods.  No growth in urine culture.    Antibiotics: Ceftriaxone   Protocols / Consultants: Cardiology  Tests / Events: 1/9 severe resp failure acute sCHF, intubated 1/8 1/10 SAT/SBT pending, +UTI continue ABX 1/11 patient failed wean due to agitation and increased work of breathing  CC Follow up severe acute respiratory distress  HPI Intubated and sedated on fentanyl and propofol Grade I diastolic dysfunction with calcified aortic valve Pulmonary edema Improving AKI Urinary tract infection Failing vent wean attempts Remains critically ill   PAST MEDICAL HISTORY   History reviewed. No pertinent past medical history.   SURGICAL HISTORY   History reviewed. No pertinent surgical history.   FAMILY HISTORY   No family history on file.   SOCIAL HISTORY   Social History   Tobacco Use  . Smoking status: Not on file  .  Smokeless tobacco: Never Used  Substance Use Topics  . Alcohol use: Never  . Drug use: Never     MEDICATIONS   Current Medication:  Current Facility-Administered Medications:  .  0.9 %  sodium chloride infusion, 250 mL, Intravenous, PRN, Flora Lipps, MD .  acetaminophen (TYLENOL) tablet 650 mg, 650 mg, Oral, Q4H PRN, Mortimer Fries, Cassandra Harbold, MD .  cefTRIAXone (ROCEPHIN) 1 g in sodium chloride 0.9 % 100 mL IVPB, 1 g, Intravenous, Daily, Flora Lipps, MD, Stopped at 07/30/19 1147 .  chlorhexidine gluconate (MEDLINE KIT) (PERIDEX) 0.12 % solution 15 mL, 15 mL, Mouth Rinse, BID, Oswin Griffith, MD, 15 mL at 07/31/19 0753 .  Chlorhexidine Gluconate Cloth 2 % PADS 6 each, 6 each, Topical, Daily, Flora Lipps, MD, 6 each at 07/30/19 1000 .  docusate (COLACE) 50 MG/5ML liquid 100 mg, 100 mg, Per Tube, BID PRN, Mortimer Fries, Vladislav Axelson, MD .  enoxaparin (LOVENOX) injection 40 mg, 40 mg, Subcutaneous, Q24H, Hallaji, Sheema M, RPH, 40 mg at 07/30/19 1100 .  famotidine (PEPCID) tablet 20 mg, 20 mg, Per Tube, BID, Flora Lipps, MD, 20 mg at 07/30/19 2149 .  feeding supplement (PRO-STAT SUGAR FREE 64) liquid 60 mL, 60 mL, Per Tube, TID, Flora Lipps, MD, 60 mL at 07/30/19 2150 .  feeding supplement (VITAL HIGH PROTEIN) liquid 1,000 mL, 1,000 mL, Per Tube, Q24H, Aleighya Mcanelly, MD, 1,000 mL at 07/30/19 1402 .  fentaNYL (SUBLIMAZE) injection 50 mcg, 50 mcg, Intravenous, Q15 min PRN, Flora Lipps, MD, 50 mcg at 07/29/19 1652 .  fentaNYL (SUBLIMAZE) injection 50-200 mcg, 50-200 mcg, Intravenous, Q30 min PRN, Flora Lipps, MD, 100 mcg at 07/30/19 0108 .  fentaNYL 2570mg in NS 2580m(1057mml) infusion-PREMIX, 0-400 mcg/hr, Intravenous, Continuous, KasFlora LippsD, Last  Rate: 7.5 mL/hr at 07/31/19 0800, 75 mcg/hr at 07/31/19 0800 .  MEDLINE mouth rinse, 15 mL, Mouth Rinse, 10 times per day, Flora Lipps, MD, 15 mL at 07/31/19 0627 .  midazolam (VERSED) injection 2 mg, 2 mg, Intravenous, Q15 min PRN, Flora Lipps, MD .  midazolam  (VERSED) injection 2 mg, 2 mg, Intravenous, Q2H PRN, Mortimer Fries, Zandon Talton, MD .  ondansetron (ZOFRAN) injection 4 mg, 4 mg, Intravenous, Q6H PRN, Mortimer Fries, Sanoe Hazan, MD .  propofol (DIPRIVAN) 1000 MG/100ML infusion, 5-80 mcg/kg/min, Intravenous, Continuous, Earleen Newport, MD, Last Rate: 10.21 mL/hr at 07/31/19 0800, 15 mcg/kg/min at 07/31/19 0800 .  senna-docusate (Senokot-S) tablet 2 tablet, 2 tablet, Per Tube, BID, Flora Lipps, MD, 2 tablet at 07/30/19 2149    ALLERGIES   Patient has no allergy information on record.    REVIEW OF SYSTEMS   Unable to obtain due to severity of patient criticall  illness.     PHYSICAL EXAMINATION   Vital Signs: Temp:  [98.2 F (36.8 C)-99.6 F (37.6 C)] 98.7 F (37.1 C) (01/12 0800) Pulse Rate:  [55-90] 56 (01/12 0800) Resp:  [18-38] 20 (01/12 0800) BP: (118-186)/(39-68) 133/43 (01/12 0800) SpO2:  [89 %-100 %] 96 % (01/12 0800) FiO2 (%):  [30 %-40 %] 35 % (01/12 0237) Weight:  [116.4 kg] 116.4 kg (01/12 0414)  GENERAL: Patient sedated.  Blinks eyes in response to touch.  Does not follow commands. HEAD: Normocephalic, atraumatic.  EYES: Pupils equal, round, reactive to light.  No scleral icterus.  MOUTH: Moist mucosal membrane. NECK: Supple. No thyromegaly. No nodules. No JVD.  PULMONARY: Wheezes in upper lung fields.   CARDIOVASCULAR: S1 and S2. Regular rate and rhythm. No murmurs, rubs, or gallops.  GASTROINTESTINAL: Soft, nontender, non-distended. No masses. Positive bowel sounds. No hepatosplenomegaly.  MUSCULOSKELETAL: No swelling, clubbing, or edema. Dorsalis pedis pulses symmetric and 2+ bilaterally.  NEUROLOGIC: Sedated, blinks eyes in response to touch.  Does not follow commands. SKIN:intact,warm,dry   PERTINENT DATA     Infusions: . sodium chloride    . cefTRIAXone (ROCEPHIN)  IV Stopped (07/30/19 1147)  . fentaNYL infusion INTRAVENOUS 75 mcg/hr (07/31/19 0800)  . propofol (DIPRIVAN) infusion 15 mcg/kg/min (07/31/19 0800)    Scheduled Medications: . chlorhexidine gluconate (MEDLINE KIT)  15 mL Mouth Rinse BID  . Chlorhexidine Gluconate Cloth  6 each Topical Daily  . enoxaparin (LOVENOX) injection  40 mg Subcutaneous Q24H  . famotidine  20 mg Per Tube BID  . feeding supplement (PRO-STAT SUGAR FREE 64)  60 mL Per Tube TID  . feeding supplement (VITAL HIGH PROTEIN)  1,000 mL Per Tube Q24H  . mouth rinse  15 mL Mouth Rinse 10 times per day  . senna-docusate  2 tablet Per Tube BID   PRN Medications: sodium chloride, acetaminophen, docusate, fentaNYL (SUBLIMAZE) injection, fentaNYL (SUBLIMAZE) injection, midazolam, midazolam, ondansetron (ZOFRAN) IV Hemodynamic parameters:   Intake/Output: 01/11 0701 - 01/12 0700 In: 1142.4 [I.V.:843; NG/GT:299.3] Out: 910 [Urine:910]  Ventilator  Settings: Vent Mode: PRVC FiO2 (%):  [30 %-40 %] 35 % Set Rate:  [20 bmp] 20 bmp Vt Set:  [500 mL] 500 mL PEEP:  [5 cmH20] 5 cmH20 Pressure Support:  [8 cmH20] 8 cmH20 Plateau Pressure:  [19 cmH20-22 cmH20] 22 cmH20   Other Labs:  LAB RESULTS:  Basic Metabolic Panel: Recent Labs  Lab 07/27/19 2227 07/28/19 0220 07/29/19 0422 07/30/19 0624 07/31/19 0444  NA 138 137 138 139 139  K 4.0 4.6 3.7 3.9 3.7  CL 102 103 104  105 106  CO2 20* _0 GLUCOSE 266* 121* 123* 94 110*  BUN 16 21 24* 21 31*  CREATININE 1.46* 1.46* 1.42* 1.36* 1.22  CALCIUM 9.1 8.6* 8.3* 8.3* 8.5*  MG  --   --   --  2.3 2.4  PHOS  --   --   --  3.0 3.1   Liver Function Tests: Recent Labs  Lab 07/27/19 2227  AST 30  ALT 24  ALKPHOS 76  BILITOT 0.5  PROT 7.2  ALBUMIN 3.9   No results for input(s): LIPASE, AMYLASE in the last 168 hours. No results for input(s): AMMONIA in the last 168 hours. CBC: Recent Labs  Lab 07/27/19 2227 07/28/19 0220 07/29/19 0422 07/30/19 0624 07/31/19 0444  WBC 19.5* 9.3 10.9* 9.0 7.3  NEUTROABS 6.9  --   --  6.0  --   HGB 14.6 13.2 12.5* 12.0* 12.6*  HCT 48.2 39.7 38.3* 37.6* 37.7*  MCV 103.2*  95.7 97.2 97.4 95.9  PLT 327 269 221 217 212   Cardiac Enzymes: No results for input(s): CKTOTAL, CKMB, CKMBINDEX, TROPONINI in the last 168 hours. BNP: Invalid input(s): POCBNP CBG: Recent Labs  Lab 07/28/19 1058 07/30/19 1940 07/31/19 0029 07/31/19 0407 07/31/19 0728  GLUCAP 86 107* 98 97 99     IMAGING RESULTS:  Imaging: DG Abd 1 View  Result Date: 07/30/2019 CLINICAL DATA:  74 year old male with NG placement. EXAM: ABDOMEN - 1 VIEW COMPARISON:  Abdominal radiograph dated 07/27/2019. FINDINGS: Partially visualized enteric tube with tip and side-port in the body of the stomach. Small left and possible trace right pleural effusion. Left lung base atelectasis or infiltrate. Degenerative changes of the spine.  No acute osseous pathology. IMPRESSION: 1. Enteric tube with tip in the body of the stomach. 2. Small left and probable trace right pleural effusion. Left lung base atelectasis or infiltrate. Electronically Signed   By: Anner Crete M.D.   On: 07/30/2019 22:17   ECHOCARDIOGRAM COMPLETE  Result Date: 07/29/2019   ECHOCARDIOGRAM REPORT   Patient Name:   Antonio [name] Date of Exam: 07/29/2019 Medical Rec #:  [id number]       Height:       67.0 in Accession #:    [id number]      Weight:       255.7 lb Date of Birth:  [birth year]      BSA:          2.24 m Patient Age:    71 years        BP:           132/42 mmHg Patient Gender: M               HR:           71 bpm. Exam Location:  [facility] Procedure: 2D Echo and Intracardiac Opacification Agent Indications:     DYSPNEA 786.09/ R06.00  History:         Patient has no prior history of Echocardiogram examinations.  Sonographer:     Arville Go RDCS Referring Phys:  [id number] Flora Lipps Diagnosing Phys: Ida Rogue MD  Sonographer Comments: Technically challenging study due to limited acoustic windows, Technically difficult study due to poor echo windows and echo performed with patient supine and on artificial respirator. Image  acquisition challenging due to respiratory motion and Image acquisition challenging due to patient body habitus. IMPRESSIONS  1. Left ventricular ejection fraction, by visual estimation, is 55 to 60%. The left ventricle  has normal function. There is mild to moderately increased left ventricular hypertrophy.  2. Left ventricular diastolic parameters are consistent with Grade I diastolic dysfunction (impaired relaxation).  3. Left ventricle with no regional wall motion abnormalities.  4. Global right ventricle has normal systolic function.The right ventricular size is normal. No increase in right ventricular wall thickness.  5. Left atrial size was normal.  6. The aortic valve is calcified, not well visualized. Moderate aortic valve stenosis.  7. The inferior vena cava is normal in size with <50% respiratory variability, suggesting right atrial pressure of 8 mmHg.  8. TR signal is inadequate for assessing pulmonary artery systolic pressure.  9. Challenging images, definity used. FINDINGS  Left Ventricle: Left ventricular ejection fraction, by visual estimation, is 55 to 60%. The left ventricle has normal function. The left ventricle has no regional wall motion abnormalities. There is mildly increased left ventricular hypertrophy. Left ventricular diastolic parameters are consistent with Grade I diastolic dysfunction (impaired relaxation). Normal left atrial pressure. Right Ventricle: The right ventricular size is normal. No increase in right ventricular wall thickness. Global RV systolic function is has normal systolic function. Left Atrium: Left atrial size was normal in size. Right Atrium: Right atrial size was normal in size Pericardium: There is no evidence of pericardial effusion. Mitral Valve: The mitral valve is normal in structure. No evidence of mitral valve regurgitation. No evidence of mitral valve stenosis by observation. Tricuspid Valve: The tricuspid valve is normal in structure. Tricuspid valve  regurgitation is not demonstrated. Aortic Valve: The aortic valve was not well visualized. Aortic valve regurgitation is not visualized. Moderate aortic stenosis is present. Aortic valve mean gradient measures 23.0 mmHg. Aortic valve peak gradient measures 37.9 mmHg. Pulmonic Valve: The pulmonic valve was normal in structure. Pulmonic valve regurgitation is not visualized. Pulmonic regurgitation is not visualized. Aorta: The aortic root, ascending aorta and aortic arch are all structurally normal, with no evidence of dilitation or obstruction and the aortic root was not well visualized. Venous: The inferior vena cava is normal in size with less than 50% respiratory variability, suggesting right atrial pressure of 8 mmHg. IAS/Shunts: No atrial level shunt detected by color flow Doppler. There is no evidence of a patent foramen ovale. No ventricular septal defect is seen or detected. There is no evidence of an atrial septal defect.  LEFT VENTRICLE PLAX 2D LVIDd:         5.43 cm Diastology LVIDs:         3.92 cm LV e' lateral:   5.55 cm/s LV PW:         1.38 cm LV E/e' lateral: 17.0 LV IVS:        1.33 cm LV e' medial:    4.35 cm/s LV SV:         76 ml   LV E/e' medial:  21.6 LV SV Index:   31.88  RIGHT VENTRICLE RV Basal diam:  2.64 cm RV S prime:     16.90 cm/s TAPSE (M-mode): 2.8 cm LEFT ATRIUM             Index       RIGHT ATRIUM           Index LA Vol (A2C):   35.1 ml 15.64 ml/m RA Area:     15.10 cm LA Vol (A4C):   32.2 ml 14.34 ml/m RA Volume:   34.60 ml  15.41 ml/m LA Biplane Vol: 34.1 ml 15.19 ml/m  AORTIC VALVE AV Vmax:  308.00 cm/s AV Vmean:          223.000 cm/s AV VTI:            0.689 m AV Peak Grad:      37.9 mmHg AV Mean Grad:      23.0 mmHg LVOT Vmax:         101.00 cm/s LVOT Vmean:        66.400 cm/s LVOT VTI:          0.221 m LVOT/AV VTI ratio: 0.32 MITRAL VALVE MV Area (PHT): 3.85 cm             SHUNTS MV PHT:        57.13 msec           Systemic VTI: 0.22 m MV Decel Time: 197 msec MV  E velocity: 94.10 cm/s 103 cm/s MV A velocity: 86.80 cm/s 70.3 cm/s MV E/A ratio:  1.08       1.5  Ida Rogue MD Electronically signed by Ida Rogue MD Signature Date/Time: 07/29/2019/2:57:42 PM    Final    _0 @   ASSESSMENT AND PLAN    -Multidisciplinary rounds held today  Severe Acute Hypoxic Respiratory Failure due to pulmonary edema, pleural effusion and diastolic heart failure -Pt currently maintaining SpO2 at 96% on FiO2 35% and PEEP 5.  Failed weaning trial yesterday with agitation and increased work of breathing   -continue Bronchodilator Therapy -Wean Fio2 and PEEP as tolerated -will perform SAT/SBT when respiratory parameters are met  Urinary tract infection - urinalysis 07/26/2018 consistent with rare bacteria and 11-20 leukocytes.  Urine culture 07/27/2018 showed no growth - Elevated procalcitonin today 0.32. - change ceftriaxone to unasyn beginning tomorrow.  Continue for 3 days.    CARDIAC FAILURE - Echo on 07/28/2018 demonstrated grade I diastolic dysfunction and calcified aortic valve.  EF 55-60%.   -oxygen as needed ICU monitoring - Lasix 40 mg once  Acute Kidney injury improving -Creatinine 1.22 today, potassium 3.7.  BUN 31.   -follow chem 7 -follow UO -continue Foley Catheter-assess need daily   NEUROLOGY - intubated and sedated with propofol and fentanyl - minimal sedation to achieve a RASS goal: -1 Wake up assessment pending  Social work - patient entered hospital as Antonio [name].  Much of patient medical history still unknown.  No known family.  Social work consult. - Potential daughter in law Antonio [name] identified.    GI/Nutrition - continue famotidine for ulcer prophylaxis DIET-->continue TF's as tolerated Constipation protocol as indicated, last bowel movement last night- continue docusate  ENDO - ICU hypoglycemic\Hyperglycemia protocol -check FSBS per protocol  ELECTROLYTES -follow labs as needed -replace as  needed -pharmacy consultation   DVT/GI PRX ordered -SCDs  - Continue lovenox 40 mg TRANSFUSIONS AS NEEDED MONITOR FSBS ASSESS the need for LABS as needed     Critical Care Time devoted to patient care services described in this note is 35 minutes.   Overall, patient is critically ill, prognosis is guarded.  Patient with Multiorgan failure and at high risk for cardiac arrest and death.    Corrin Parker, M.D.  [facility]  Medical Director [place] Director [facility] Cardio-Pulmonary Department

## 2019-07-31 NOTE — Clinical Social Work Note (Addendum)
[place] Dept to see if they could look up patient's information, family contacts, etc. They did not show patient in their system other than when the EMT's went to his home but did find a [employer]. They will have an officer look into it and have them call CSW back.  Dayton Scrape, [place]  12:18 pm Received call back from [place] PD. They have identified a potential daughter-in-law, Seraj Bourgoin. Most recent phone number: [phone]. MD and PA student aware.  Dayton Scrape, [place]

## 2019-08-01 DIAGNOSIS — F10231 Alcohol dependence with withdrawal delirium: Secondary | ICD-10-CM

## 2019-08-01 DIAGNOSIS — J441 Chronic obstructive pulmonary disease with (acute) exacerbation: Secondary | ICD-10-CM

## 2019-08-01 DIAGNOSIS — F1010 Alcohol abuse, uncomplicated: Secondary | ICD-10-CM

## 2019-08-01 LAB — CULTURE, BLOOD (ROUTINE X 2)
Culture: NO GROWTH
Specimen Description: ADEQUATE

## 2019-08-01 LAB — BASIC METABOLIC PANEL
Anion gap: 7 (ref 5–15)
BUN: 41 mg/dL — ABNORMAL HIGH (ref 8–23)
CO2: 27 mmol/L (ref 22–32)
Calcium: 8.5 mg/dL — ABNORMAL LOW (ref 8.9–10.3)
Chloride: 107 mmol/L (ref 98–111)
Creatinine, Ser: 1.3 mg/dL — ABNORMAL HIGH (ref 0.61–1.24)
GFR calc Af Amer: >60 mL/min (ref >60)
GFR calc non Af Amer: 54 mL/min — ABNORMAL LOW (ref >60)
Glucose, Bld: 134 mg/dL — ABNORMAL HIGH (ref 70–99)
Potassium: 4.3 mmol/L (ref 3.5–5.1)
Sodium: 141 mmol/L (ref 135–145)

## 2019-08-01 LAB — HEMOGLOBIN A1C
Hgb A1c MFr Bld: 4.9 % (ref 4.8–5.6)
Mean Plasma Glucose: 93.93 mg/dL

## 2019-08-01 LAB — GLUCOSE, CAPILLARY
Glucose-Capillary: 133 mg/dL — ABNORMAL HIGH (ref 70–99)
Glucose-Capillary: 127 mg/dL — ABNORMAL HIGH (ref 70–99)
Glucose-Capillary: 127 mg/dL — ABNORMAL HIGH (ref 70–99)
Glucose-Capillary: 135 mg/dL — ABNORMAL HIGH (ref 70–99)
Glucose-Capillary: 130 mg/dL — ABNORMAL HIGH (ref 70–99)
Glucose-Capillary: 135 mg/dL — ABNORMAL HIGH (ref 70–99)
Glucose-Capillary: 134 mg/dL — ABNORMAL HIGH (ref 70–99)

## 2019-08-01 LAB — MAGNESIUM: Magnesium: 2.5 mg/dL — ABNORMAL HIGH (ref 1.7–2.4)

## 2019-08-01 LAB — TROPONIN I (HIGH SENSITIVITY)
Troponin I (High Sensitivity): 387 ng/L (ref <18)
Troponin I (High Sensitivity): 383 ng/L (ref <18)

## 2019-08-01 MED ORDER — FOLIC ACID 1 MG PO TABS
1.0000 mg | ORAL_TABLET | Freq: Every day | ORAL | Status: DC
  Administered 2019-08-01 – 2019-08-11 (×11): 1 mg
  Filled 2019-08-01 (×11): qty 1

## 2019-08-01 MED ORDER — CHLORDIAZEPOXIDE HCL 25 MG PO CAPS
25.0000 mg | ORAL_CAPSULE | ORAL | Status: AC
  Administered 2019-08-03: 25 mg
  Filled 2019-08-01 (×2): qty 1

## 2019-08-01 MED ORDER — CHLORDIAZEPOXIDE HCL 25 MG PO CAPS
25.0000 mg | ORAL_CAPSULE | Freq: Four times a day (QID) | ORAL | Status: AC
  Administered 2019-08-01 (×4): 25 mg
  Filled 2019-08-01 (×4): qty 1

## 2019-08-01 MED ORDER — CHLORDIAZEPOXIDE HCL 25 MG PO CAPS
25.0000 mg | ORAL_CAPSULE | Freq: Every day | ORAL | Status: AC
  Administered 2019-08-03: 25 mg

## 2019-08-01 MED ORDER — INSULIN ASPART 100 UNIT/ML ~~LOC~~ SOLN
0.0000 [IU] | SUBCUTANEOUS | Status: DC
  Administered 2019-08-01 – 2019-08-06 (×17): 2 [IU] via SUBCUTANEOUS
  Administered 2019-08-06: 17:00:00 1 [IU] via SUBCUTANEOUS
  Administered 2019-08-06 – 2019-08-07 (×2): 2 [IU] via SUBCUTANEOUS
  Administered 2019-08-08: 3 [IU] via SUBCUTANEOUS
  Administered 2019-08-08 (×2): 2 [IU] via SUBCUTANEOUS
  Administered 2019-08-09: 1 [IU] via SUBCUTANEOUS
  Administered 2019-08-09: 05:00:00 2 [IU] via SUBCUTANEOUS
  Filled 2019-08-01 (×26): qty 1

## 2019-08-01 MED ORDER — LOPERAMIDE HCL 2 MG PO CAPS: 2.0000 mg | ORAL_CAPSULE | ORAL | Status: AC | PRN

## 2019-08-01 MED ORDER — CHLORDIAZEPOXIDE HCL 25 MG PO CAPS: 25.0000 mg | ORAL_CAPSULE | Freq: Four times a day (QID) | ORAL | Status: AC | PRN

## 2019-08-01 MED ORDER — THIAMINE HCL 100 MG/ML IJ SOLN: 100.0000 mg | Freq: Once | INTRAMUSCULAR | Status: DC

## 2019-08-01 MED ORDER — THIAMINE HCL 100 MG/ML IJ SOLN
500.0000 mg | Freq: Every day | INTRAVENOUS | Status: AC
  Administered 2019-08-01 – 2019-08-05 (×5): 500 mg via INTRAVENOUS
  Filled 2019-08-01 (×5): qty 5

## 2019-08-01 MED ORDER — ADULT MULTIVITAMIN LIQUID CH
15.0000 mL | Freq: Every day | ORAL | Status: DC
  Administered 2019-08-01 – 2019-08-11 (×11): 15 mL via ORAL
  Filled 2019-08-01 (×13): qty 15

## 2019-08-01 MED ORDER — IPRATROPIUM-ALBUTEROL 0.5-2.5 (3) MG/3ML IN SOLN
3.0000 mL | RESPIRATORY_TRACT | Status: DC
  Administered 2019-08-01 – 2019-08-06 (×34): 3 mL via RESPIRATORY_TRACT
  Filled 2019-08-01 (×34): qty 3

## 2019-08-01 MED ORDER — THIAMINE HCL 100 MG PO TABS: 100.0000 mg | ORAL_TABLET | Freq: Every day | ORAL | Status: DC

## 2019-08-01 MED ORDER — THIAMINE HCL 100 MG PO TABS
100.0000 mg | ORAL_TABLET | Freq: Every day | ORAL | Status: DC
  Administered 2019-08-06 – 2019-08-11 (×6): 100 mg
  Filled 2019-08-01 (×6): qty 1

## 2019-08-01 MED ORDER — HYDROXYZINE HCL 25 MG PO TABS
25.0000 mg | ORAL_TABLET | Freq: Four times a day (QID) | ORAL | Status: DC | PRN
  Filled 2019-08-01: qty 1

## 2019-08-01 MED ORDER — CHLORDIAZEPOXIDE HCL 25 MG PO CAPS
25.0000 mg | ORAL_CAPSULE | Freq: Three times a day (TID) | ORAL | Status: AC
  Administered 2019-08-02 (×3): 25 mg via ORAL
  Filled 2019-08-01 (×3): qty 1

## 2019-08-01 MED ORDER — BUDESONIDE 0.5 MG/2ML IN SUSP
0.5000 mg | Freq: Two times a day (BID) | RESPIRATORY_TRACT | Status: DC
  Administered 2019-08-01 – 2019-08-07 (×13): 0.5 mg via RESPIRATORY_TRACT
  Filled 2019-08-01 (×13): qty 2

## 2019-08-01 MED ORDER — SODIUM CHLORIDE 0.9% FLUSH
10.0000 mL | INTRAVENOUS | Status: DC | PRN
  Administered 2019-08-03: 10 mL

## 2019-08-01 NOTE — Progress Notes (Signed)
Dr. Mortimer Fries aware of tropoinin 387. No new orders received.

## 2019-08-01 NOTE — Progress Notes (Addendum)
CRITICAL CARE PROGRESS NOTE    Name: Elic Vencill MRN: [id number] DOB: 21-Oct-1945     LOS: 4   SUBJECTIVE FINDINGS & SIGNIFICANT EVENTS   Patient description: Mr. Fodor is a 74 yo male with a history of alcohol use and tobacco use who presented to the ED via EMS on 1/8 with acute respiratory distress.  ED workup consistent with pulmonary edema, and grade I diastolic dysfunction.  He was intubated and sedated and admitted to the ICU.     Lines / Drains: PIV x2 Right antecubital, left antecubital Urethral catheter NG/OG ET Tube x 4 days on vent FiO2 35%, PEEP 5  Cultures / Sepsis markers: Respiratory culture- rare gram positive cocci and rare gram variable rod  Antibiotics: Unasyn   Protocols / Consultants: CIWA protocol  Tests / Events: 1/9 severe resp failure acute sCHF, intubated 1/8 1/10 SAT/SBT pending, +UTI continue ABX 1/11 patient failed wean due to agitation and increased work of breathing    Overnight: Developed sinus bradycardia ~47 bpm around 4 AM.     CC Follow up severe acute respiratory distress  HPI Intubated and sedated on propofol Grade I diastolic dysfunction with calcified aortic valve Pulmonary edema AKI Urinary tract infection Failing vent wean attempts Remains critically ill Sinus bradycardia Previously Jenny Reichmann Doe, identified son yesterday.  Son at bedside.  Family meeting today.   PAST MEDICAL HISTORY   Past Medical History:  Diagnosis Date  . ETOH abuse   . Smoker        MEDICATIONS   Current Medication:  Current Facility-Administered Medications:  .  0.9 %  sodium chloride infusion, 250 mL, Intravenous, PRN, Flora Lipps, MD .  acetaminophen (TYLENOL) tablet 650 mg, 650 mg, Oral, Q4H PRN, Mortimer Fries, Carin Shipp, MD .  Ampicillin-Sulbactam (UNASYN) 3  g in sodium chloride 0.9 % 100 mL IVPB, 3 g, Intravenous, Q6H, Charlett Nose, RPH .  chlorhexidine gluconate (MEDLINE KIT) (PERIDEX) 0.12 % solution 15 mL, 15 mL, Mouth Rinse, BID, Wing Gfeller, MD, 15 mL at 08/01/19 0816 .  Chlorhexidine Gluconate Cloth 2 % PADS 6 each, 6 each, Topical, Daily, Flora Lipps, MD, 6 each at 07/31/19 2243 .  docusate (COLACE) 50 MG/5ML liquid 100 mg, 100 mg, Per Tube, BID PRN, Mortimer Fries, Jermani Eberlein, MD .  enoxaparin (LOVENOX) injection 40 mg, 40 mg, Subcutaneous, Q24H, Hallaji, Sheema M, RPH, 40 mg at 07/31/19 0934 .  famotidine (PEPCID) tablet 20 mg, 20 mg, Per Tube, BID, Flora Lipps, MD, 20 mg at 07/31/19 2243 .  feeding supplement (PRO-STAT SUGAR FREE 64) liquid 60 mL, 60 mL, Per Tube, TID, Flora Lipps, MD, 60 mL at 07/31/19 2243 .  feeding supplement (VITAL HIGH PROTEIN) liquid 1,000 mL, 1,000 mL, Per Tube, Q24H, Flora Lipps, MD, 1,000 mL at 07/31/19 0937 .  fentaNYL (SUBLIMAZE) injection 50 mcg, 50 mcg, Intravenous, Q15 min PRN, Flora Lipps, MD, 50 mcg at 07/29/19 1652 .  fentaNYL (SUBLIMAZE) injection 50-200 mcg, 50-200 mcg, Intravenous, Q30 min PRN, Flora Lipps, MD, 100 mcg at 07/30/19 0108 .  fentaNYL 2557mg in NS 2556m(1062mml) infusion-PREMIX, 0-400 mcg/hr, Intravenous, Continuous, Naiomy Watters, MD, Last Rate: 20 mL/hr at 08/01/19 0848, 200 mcg/hr at 08/01/19 0848 .  MEDLINE mouth rinse, 15 mL, Mouth Rinse, 10 times per day, KasFlora LippsD, 15 mL at 08/01/19 0522 .  methylPREDNISolone sodium succinate (SOLU-MEDROL) 40 mg/mL injection 20 mg, 20 mg, Intravenous, Q12H, Skyla Champagne, MD, 20 mg at 08/01/19 0158 .  midazolam (VERSED) injection 2 mg, 2 mg,  Intravenous, Q15 min PRN, Flora Lipps, MD .  midazolam (VERSED) injection 2 mg, 2 mg, Intravenous, Q2H PRN, Mortimer Fries, Jenalee Trevizo, MD .  ondansetron (ZOFRAN) injection 4 mg, 4 mg, Intravenous, Q6H PRN, Mortimer Fries, Ajit Errico, MD .  polyethylene glycol (MIRALAX / GLYCOLAX) packet 17 g, 17 g, Per Tube, Daily, Charlett Nose,  RPH, 17 g at 07/31/19 0934 .  propofol (DIPRIVAN) 1000 MG/100ML infusion, 5-80 mcg/kg/min, Intravenous, Continuous, Earleen Newport, MD, Last Rate: 13.61 mL/hr at 08/01/19 0848, 20 mcg/kg/min at 08/01/19 0848 .  senna-docusate (Senokot-S) tablet 2 tablet, 2 tablet, Per Tube, BID, Flora Lipps, MD, 2 tablet at 07/31/19 2243    ALLERGIES   Patient has no allergy information on record.    REVIEW OF SYSTEMS    Unable to obtain due to severity of patient illness  PHYSICAL EXAMINATION   Vital Signs: Temp:  [97.7 F (36.5 C)-99 F (37.2 C)] 97.7 F (36.5 C) (01/13 0800) Pulse Rate:  [47-83] 47 (01/13 0800) Resp:  [18-23] 20 (01/13 0800) BP: (112-166)/(37-81) 127/48 (01/13 0800) SpO2:  [90 %-97 %] 92 % (01/13 0800) FiO2 (%):  [35 %] 35 % (01/13 0800) Weight:  [112.3 kg] 112.3 kg (01/13 0358)  GENERAL: Obese, ill-appearing man.  Intubated and sedated.  Non-responsive.   HEAD: Normocephalic, atraumatic.  EYES: Pupils equal, round, reactive to light.  No scleral icterus.  MOUTH: Moist mucosal membrane. NECK: Supple. No thyromegaly. No nodules. No JVD.  PULMONARY: Diminished in left lung base.  Right clear to auscultation. CARDIOVASCULAR: Distant cardiac sounds.  S1 and S2. Regular rate and rhythm. No murmurs, rubs, or gallops.  GASTROINTESTINAL: Soft, nontender, non-distended. No masses. Positive bowel sounds. No hepatosplenomegaly.   MUSCULOSKELETAL: No swelling, clubbing, or edema.  NEUROLOGIC: Mild distress due to acute illness SKIN:intact,warm,dry   PERTINENT DATA     Infusions: . sodium chloride    . ampicillin-sulbactam (UNASYN) IV    . fentaNYL infusion INTRAVENOUS 200 mcg/hr (08/01/19 0848)  . propofol (DIPRIVAN) infusion 20 mcg/kg/min (08/01/19 0848)   Scheduled Medications: . chlorhexidine gluconate (MEDLINE KIT)  15 mL Mouth Rinse BID  . Chlorhexidine Gluconate Cloth  6 each Topical Daily  . enoxaparin (LOVENOX) injection  40 mg Subcutaneous Q24H  .  famotidine  20 mg Per Tube BID  . feeding supplement (PRO-STAT SUGAR FREE 64)  60 mL Per Tube TID  . feeding supplement (VITAL HIGH PROTEIN)  1,000 mL Per Tube Q24H  . mouth rinse  15 mL Mouth Rinse 10 times per day  . methylPREDNISolone (SOLU-MEDROL) injection  20 mg Intravenous Q12H  . polyethylene glycol  17 g Per Tube Daily  . senna-docusate  2 tablet Per Tube BID   PRN Medications: sodium chloride, acetaminophen, docusate, fentaNYL (SUBLIMAZE) injection, fentaNYL (SUBLIMAZE) injection, midazolam, midazolam, ondansetron (ZOFRAN) IV Hemodynamic parameters:   Intake/Output: 01/12 0701 - 01/13 0700 In: 1191.4 [I.V.:891.4; NG/GT:200; IV Piggyback:100] Out: 1960 [[id number]]  Ventilator  Settings: Vent Mode: PRVC FiO2 (%):  [35 %] 35 % Set Rate:  [20 bmp] 20 bmp Vt Set:  [500 mL] 500 mL PEEP:  [5 cmH20] 5 cmH20 Plateau Pressure:  [20 UUV25-36 cmH20] 20 cmH20   Other Labs:  LAB RESULTS:  Basic Metabolic Panel: Recent Labs  Lab 07/28/19 0220 07/29/19 0422 07/30/19 0624 07/31/19 0444 08/01/19 0717  NA 137 138 139 139 141  K 4.6 3.7 3.9 3.7 4.3  CL 103 104 105 106 107  CO2 '26 24 26 28 27  '$ GLUCOSE 121* 123* 94 110* 134*  BUN  21 24* 21 31* 41*  CREATININE 1.46* 1.42* 1.36* 1.22 1.30*  CALCIUM 8.6* 8.3* 8.3* 8.5* 8.5*  MG  --   --  2.3 2.4 2.5*  PHOS  --   --  3.0 3.1  --    Liver Function Tests: Recent Labs  Lab 07/27/19 2227  AST 30  ALT 24  ALKPHOS 76  BILITOT 0.5  PROT 7.2  ALBUMIN 3.9   No results for input(s): LIPASE, AMYLASE in the last 168 hours. No results for input(s): AMMONIA in the last 168 hours. CBC: Recent Labs  Lab 07/27/19 2227 07/28/19 0220 07/29/19 0422 07/30/19 0624 07/31/19 0444  WBC 19.5* 9.3 10.9* 9.0 7.3  NEUTROABS 6.9  --   --  6.0  --   HGB 14.6 13.2 12.5* 12.0* 12.6*  HCT 48.2 39.7 38.3* 37.6* 37.7*  MCV 103.2* 95.7 97.2 97.4 95.9  PLT 327 269 221 217 212   Cardiac Enzymes: No results for input(s): CKTOTAL, CKMB,  CKMBINDEX, TROPONINI in the last 168 hours. BNP: Invalid input(s): POCBNP CBG: Recent Labs  Lab 07/31/19 1559 07/31/19 2029 07/31/19 2330 08/01/19 0332 08/01/19 0753  GLUCAP 96 137* 136* 133* 127*     IMAGING RESULTS:  Imaging: DG Abd 1 View  Result Date: 07/30/2019 CLINICAL DATA:  74 year old male with NG placement. EXAM: ABDOMEN - 1 VIEW COMPARISON:  Abdominal radiograph dated 07/27/2019. FINDINGS: Partially visualized enteric tube with tip and side-port in the body of the stomach. Small left and possible trace right pleural effusion. Left lung base atelectasis or infiltrate. Degenerative changes of the spine.  No acute osseous pathology. IMPRESSION: 1. Enteric tube with tip in the body of the stomach. 2. Small left and probable trace right pleural effusion. Left lung base atelectasis or infiltrate. Electronically Signed   By: Anner Crete M.D.   On: 07/30/2019 22:17      ASSESSMENT AND PLAN    -Multidisciplinary rounds held today  Acute Hypoxic Respiratory Failure due to pulmonary edema, and possible aspiration pneumonia - Patient failed weaning trial yesterday due to tachypnea, decreased O2 sats, increased work of breathing, agitation.  Now maintaining SpO2 95% on 35% FiO2 and PEEP 5.  Due to new onset bradycardia, will not have weaning trial today. -continue unasyn and solumedrol -Wean Fio2 and PEEP as tolerated -will perform SAT/SBT when respiratory parameters are met   SEVERE COPD EXACERBATION -continue IV steroids as prescribed -continue NEB THERAPY as prescribed  Sinus bradycardia - HR in the upper 40s this morning.  Normotensive, not requiring pressors. - Will monitor response.  Consider atropine if problem persists. - Will collect troponin labs.  Grade I diastolic dysfunction and aortic valve calcification -pt on vent. -Lasix as tolerated ICU monitoring  Acute kidney injury- likely due to ATN - Worse today.  Creatinine 1.3.  BUN continues to rise from  31 yesterday to 41 today.  Potassium 4.3.   -follow chem 7 -follow UO -continue Foley Catheter-assess need daily   NEUROLOGY - intubated and sedated with propofol - minimal sedation to achieve a RASS goal: -1 Will not perform wake up assessment today due to new onset bradycardia.  Goals of Care - Patient's son identified yesterday - Discussed severity of patient illness with son yesterday, will plan on another family meeting today to discuss [place].    History of Alcohol abuse HIGH RISK FOR DT's - Patient's son reports that his father is a daily drinker.  Agitation observed during weaning trials. - Will initiate CIWA protocol.  Due  to bradycardia, plan to maintain sedation for now rather than initiating ativan. - Begin Librium - Begin thiamine and folic acid supplementation  GI/Nutrition GI PROPHYLAXIS as indicated DIET--> continue TF's as tolerated Constipation protocol as indicated- on senokot-S and miralax.  Due to fentanyl use and no bowel movement since admission, consider relistor tomorrow.  ENDO - ICU hypoglycemic\Hyperglycemia protocol -check FSBS per protocol   ELECTROLYTES -follow labs as needed -replace as needed -pharmacy consultation   DVT/GI PRX ordered -SCDs  - Lovenox 40 mg TRANSFUSIONS AS NEEDED MONITOR FSBS ASSESS the need for LABS as needed   Patient remains critically ill with multi-organ failure and poor prognosis.  At high risk of developing cardiac arrest.   Critical Care Time devoted to patient care services described in this note is 35 minutes.   Overall, patient is critically ill, prognosis is guarded.  Patient with Multiorgan failure and at high risk for cardiac arrest and death.    Corrin Parker, M.D.  [facility]  Medical Director [place] Director [facility] Cardio-Pulmonary Department

## 2019-08-01 NOTE — Consult Note (Signed)
PHARMACY CONSULT NOTE - FOLLOW UP  Pharmacy Consult for Electrolyte Monitoring and Replacement   Kenton Mickley is a morbidly obese 74 YO male admitted on 07/27/19. EMS stated patient walked to truck, collapsed on stretcher, and became unresponsive. Pt was apneic but still with pulse. Upon ER admission, pt had been intubated and sedated. Pt has AKI, severe respiratory distress and acute respiratory failure. Pt is on mechanical ventilation and continuous fentanyl/propofol infusion. Furosemide 40mg  IV x 1 dose and potassium 40 mEq packet per tube x 1 dose were given 01/12 due to pulmonary edema and maintain potassium levels. Methylprednisolone IV 20mg  was initiated 01/12. Pharmacy consulted to assist in monitoring and replacing electrolytes.   Recent Labs: Potassium (mmol/L)  Date Value  08/01/2019 4.3   Magnesium (mg/dL)  Date Value  08/01/2019 2.5 (H)   Calcium (mg/dL)  Date Value  08/01/2019 8.5 (L)   Albumin (g/dL)  Date Value  07/27/2019 3.9   Phosphorus (mg/dL)  Date Value  07/31/2019 3.1   Sodium (mmol/L)  Date Value  08/01/2019 141     Assessment/Plan: 1. Electrolytes: Electrolytes WNL except calcium (slightly low) and magnesium (slightly high). Continue monitoring electrolytes. No electrolyte replacements required at this time. Continue to assess electrolytes in am labs. Will replace to maintain potassium ~4 and magnesium ~2 while patient is intubated.    2. Glucose: Range 127-134, which has been increasing the past 2 days. CBGs are being assessed Q4H. Methylprednisolone IV 20mg  was initiated 01/12. Continue to monitor glucose levels. Will order a sliding scale insulin (insulin aspart 0-15 units [place] Q4H, starting 01/13 at 1600) to help manage glucose levels.   3. Constipation: Continue senna-docusate (Senakot-S) tabs: 2 tabs per tube BID (started on 01/11 at 2200) while patient is on continuous fentanyl IV. Continue Miralax 17g per tube daily.     Roanna Banning, Pharmacy  Student

## 2019-08-01 NOTE — Progress Notes (Signed)
Pharmacy Antibiotic Note  Antonio [name] is a morbidly obese 74 y.o. male admitted on 07/27/2019. EMS stated pt walked to truck, collapsed on stretcher, and became unresponsive. Pt was apneic but still with pulse. Pt is intubated and sedated. Pt has acute kidney injury, severe respiratory distress and acute respiratory failure. Pt received antibiotics for suspected UTI upon admission, but UCx showed no growth. BCx showed no growth for 5 days. Pt is afebrile. No CBCs reported today, but past 2 days have shown a downwards WBC trend and staying WNL. Unasyn initiated today for possible CAP/Aspiration PNA. 01/14 is last day of antibiotic therapy course. Pharmacy has been consulted for Unasyn dosing and monitoring.  Plan: Continue Unasyn 3g IV Q6H for possible CAP/Aspiration PNA, last day of antibiotic therapy on 01/14 Re-evaluate if antibiotic course is necessary to continue.   Height: 5\' 7"  (170.2 cm) Weight: 247 lb 9.2 oz (112.3 kg) IBW/kg (Calculated) : 66.1  Temp (24hrs), Avg:98.4 F (36.9 C), Min:97.7 F (36.5 C), Max:99 F (37.2 C)  Recent Labs  Lab 07/27/19 2227 07/28/19 0220 07/29/19 0422 07/30/19 0624 07/31/19 0444 08/01/19 0717  WBC 19.5* 9.3 10.9* 9.0 7.3  --   CREATININE 1.46* 1.46* 1.42* 1.36* 1.22 1.30*  LATICACIDVEN >11.0* 1.5  --   --   --   --     Estimated Creatinine Clearance: 59.7 mL/min (A) (by C-G formula based on SCr of 1.3 mg/dL (H)).    Not on File  Antimicrobials this admission: Ciprofloxacin 400 mg IV 01/10 >> 01/11 Ceftriaxone 1g IV 01/11 >> 01/12 Unasyn 3g IV 01/13 >> 01/14  Dose adjustments this admission: No dose adjustments necessary since CrCl >/= 30 mL/min  Microbiology results: 01/08 BCx: No growth for 5 days 01/08 UCx: No growth (finalized 01/10) 01/09 UCx: No growth (finalized 01/11) 01/11 Sputum: Abundant WBC present, rare gram positive cocci and rare gram variable rod; culture re-incubated for better growth  01/09 MRSA PCR:  Negative 01/08 SARS Coronavirus 2 by PCR: Negative 01/08 Influenza A/B by PCR: Negative  Thank you for allowing pharmacy to be a part of this patient's care.  Roanna Banning 08/01/2019 1:36 PM

## 2019-08-01 NOTE — Progress Notes (Signed)
Family At bedside, clinical status relayed to family  Updated and notified of patients medical condition-  Progressive multiorgan failure with very low chance of meaningful recovery.    Family understands the situation.  They have consented and agreed to DNR  Family are satisfied with Plan of action and management. All questions answered  Corrin Parker, M.D.  [facility]  Medical Director [place] Director [facility] Cardio-Pulmonary Department

## 2019-08-01 NOTE — TOC Initial Note (Signed)
Transition of Care [facility]) - Initial/Assessment Note    Patient Details  Name: Antonio [name] MRN: [id number] Date of Birth: [birth year]  [facility] CM/SW Contact:    Shelbie Ammons, RN Phone Number: 08/01/2019, 2:28 PM  Clinical Narrative:  Per bedside RN they have located patient's son who did verify identity. However they have been estranged for quite some time so he was not able to offer andy information. RNCM will continue to follow and re-assess for any needs as patient is more alert and able to communicate.                 Patient Goals and CMS Choice        Expected Discharge Plan and Services                                                Prior Living Arrangements/Services                       Activities of Daily Living      Permission Sought/Granted                  Emotional Assessment              Admission diagnosis:  Respiratory failure ([place]) [J96.90] Acute respiratory failure with hypoxia and hypercapnia ([place]) [J96.01, J96.02] Patient Active Problem List   Diagnosis Date Noted  . Respiratory failure ([place]) 07/28/2019   PCP:  Patient, No Pcp Per Pharmacy:  No Pharmacies Listed    Social Determinants of Health (SDOH) Interventions    Readmission Risk Interventions No flowsheet data found.

## 2019-08-02 ENCOUNTER — Inpatient Hospital Stay: Payer: Medicare Other

## 2019-08-02 ENCOUNTER — Inpatient Hospital Stay: Payer: Self-pay

## 2019-08-02 LAB — CULTURE, RESPIRATORY W GRAM STAIN: Culture: NORMAL

## 2019-08-02 LAB — CBC
HCT: 36.3 % — ABNORMAL LOW (ref 39.0–52.0)
Hemoglobin: 11.8 g/dL — ABNORMAL LOW (ref 13.0–17.0)
MCH: 31.4 pg (ref 26.0–34.0)
MCHC: 32.5 g/dL (ref 30.0–36.0)
MCV: 96.5 fL (ref 80.0–100.0)
Platelets: 223 10*3/uL (ref 150–400)
RBC: 3.76 MIL/uL — ABNORMAL LOW (ref 4.22–5.81)
RDW: 13.4 % (ref 11.5–15.5)
WBC: 9.5 10*3/uL (ref 4.0–10.5)
nRBC: 0 % (ref 0.0–0.2)

## 2019-08-02 LAB — BASIC METABOLIC PANEL
Anion gap: 9 (ref 5–15)
BUN: 51 mg/dL — ABNORMAL HIGH (ref 8–23)
CO2: 25 mmol/L (ref 22–32)
Calcium: 8.3 mg/dL — ABNORMAL LOW (ref 8.9–10.3)
Chloride: 109 mmol/L (ref 98–111)
Creatinine, Ser: 1.15 mg/dL (ref 0.61–1.24)
GFR calc Af Amer: >60 mL/min (ref >60)
GFR calc non Af Amer: >60 mL/min (ref >60)
Glucose, Bld: 146 mg/dL — ABNORMAL HIGH (ref 70–99)
Potassium: 4.5 mmol/L (ref 3.5–5.1)
Sodium: 143 mmol/L (ref 135–145)

## 2019-08-02 LAB — GLUCOSE, CAPILLARY
Glucose-Capillary: 118 mg/dL — ABNORMAL HIGH (ref 70–99)
Glucose-Capillary: 122 mg/dL — ABNORMAL HIGH (ref 70–99)
Glucose-Capillary: 129 mg/dL — ABNORMAL HIGH (ref 70–99)
Glucose-Capillary: 130 mg/dL — ABNORMAL HIGH (ref 70–99)
Glucose-Capillary: 130 mg/dL — ABNORMAL HIGH (ref 70–99)
Glucose-Capillary: 132 mg/dL — ABNORMAL HIGH (ref 70–99)

## 2019-08-02 IMAGING — DX DG CHEST 1V PORT
1 series · 1 of 1 positions shown · non-contrast
Comparison: Portable chest [DATE] and earlier.

CLINICAL DATA: 73-year-old male unsuccessful right IJ central line
attempt. Query pneumothorax.

EXAM:
PORTABLE CHEST 1 VIEW

[chest ap]
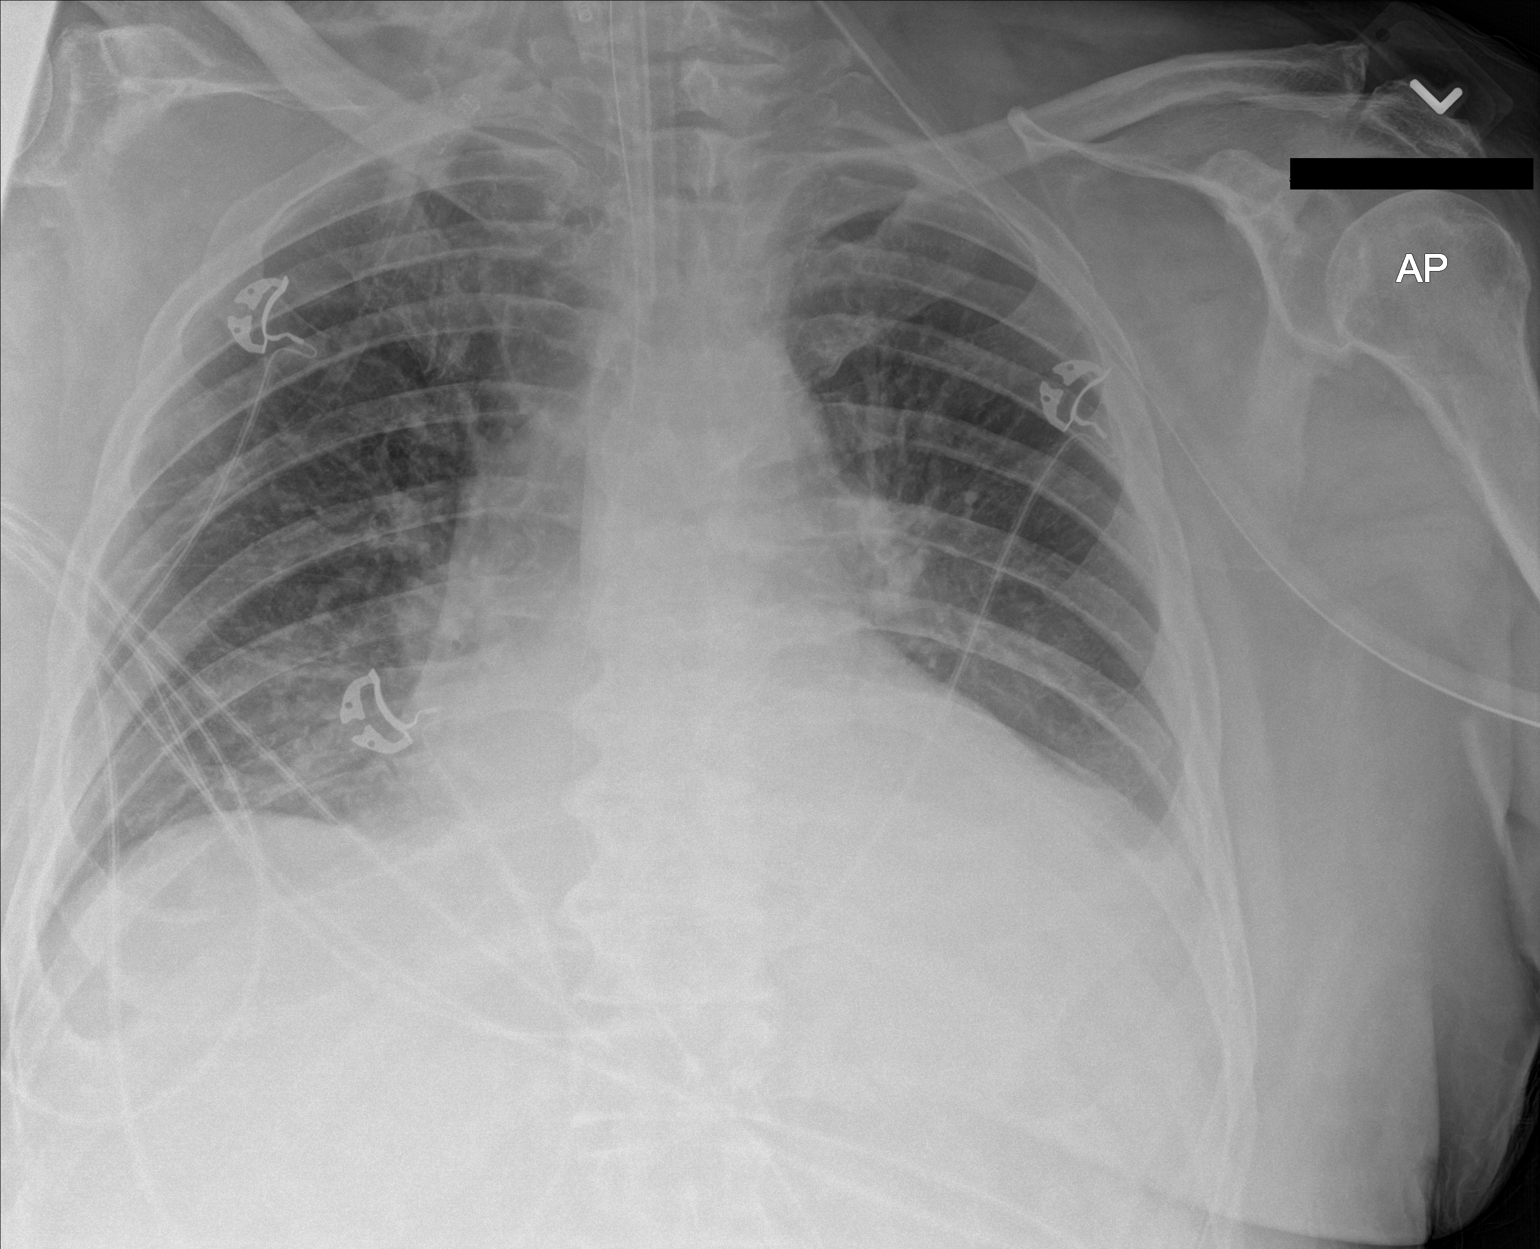

[1 of 1 positions shown; findings below may reference images not displayed]

FINDINGS: Portable AP semi upright view at [6E] hours. No pneumothorax.
Endotracheal tube tip is just below the clavicles. Enteric tube
courses to the abdomen, tip not included.

Mildly larger lung volumes. Continued dense retrocardiac opacity.
Stable cardiac size and mediastinal contours. Paucity of bowel gas
in the upper abdomen. No acute osseous abnormality identified.
IMPRESSION: 1. No pneumothorax following line placement attempt. Otherwise
stable lines and tubes.
2. Mildly larger lung volumes. Continued lower lobe collapse or
consolidation.

## 2019-08-02 MED ORDER — SENNOSIDES-DOCUSATE SODIUM 8.6-50 MG PO TABS: 2.0000 | ORAL_TABLET | Freq: Two times a day (BID) | ORAL | Status: DC | PRN

## 2019-08-02 MED ORDER — POLYETHYLENE GLYCOL 3350 17 G PO PACK: 17.0000 g | PACK | Freq: Every day | ORAL | Status: DC | PRN

## 2019-08-02 NOTE — Progress Notes (Signed)
Pharmacy Antibiotic Note  Antonio [name] is a morbidly obese 74 y.o. male admitted on 07/27/2019. EMS stated pt walked to truck, collapsed on stretcher, and became unresponsive. Pt was apneic but still with pulse. Pt is intubated and sedated. Pt has AKI, severe respiratory distress and acute respiratory failure. UCx and BCx have shown no growth. Sputum Cx consistent with normal respiratory flora. Pt is afebrile. WBC WNL since 01/11. Unasyn was initiated on 01/13 for possible CAP/Aspiration PNA. Today is the last day for antibiotic therapy course. Pharmacy has been consulted for Unasyn dosing and monitoring.  Plan: D/C Unasyn IV 3g Q6H. Today is end of 5 day antibiotic therapy course. No other antibiotic therapy is required at this time.   Height: 5\' 7"  (170.2 cm) Weight: 248 lb 10.9 oz (112.8 kg) IBW/kg (Calculated) : 66.1  Temp (24hrs), Avg:98.2 F (36.8 C), Min:97.7 F (36.5 C), Max:99 F (37.2 C)  Recent Labs  Lab 07/27/19 2227 07/27/19 2227 07/28/19 0220 07/28/19 0220 07/29/19 0422 07/30/19 0624 07/31/19 0444 08/01/19 0717 08/02/19 0410  WBC 19.5*   < > 9.3  --  10.9* 9.0 7.3  --  9.5  CREATININE 1.46*   < > 1.46*   < > 1.42* 1.36* 1.22 1.30* 1.15  LATICACIDVEN >11.0*  --  1.5  --   --   --   --   --   --    < > = values in this interval not displayed.    Estimated Creatinine Clearance: 67.6 mL/min (by C-G formula based on SCr of 1.15 mg/dL).    Not on File  Antimicrobials this admission: Ciprofloxacin 400 mg IV 01/10 >> 01/11 Ceftriaxone 1 g IV 01/11 >> 01/12 Unasyn 3g Q6H 01/13 >> 01/14  Dose adjustments this admission: No dosing adjustments required at this time.   Microbiology results: 01/08 BCx: No growth for 5 days 01/08 UCx: No growth  01/09 UCx: No growth  01/11 Sputum: Finalized report on 01/14 states rare consistent with normal respiratory flora  01/09 MRSA PCR: Negative 01/08 SARS Coronavirus 2 by PCR: Negative 01/08 Influenza A/B by PCR: Negative    Thank you for allowing pharmacy to be a part of this patient's care.  Roanna Banning 08/02/2019 12:18 PM

## 2019-08-02 NOTE — Consult Note (Signed)
PHARMACY CONSULT NOTE - FOLLOW UP  Pharmacy Consult for Electrolyte Monitoring and Replacement   Antonio [name] is a morbidly obese 74 y.o. male admitted on 07/27/2019. EMS stated pt walked to truck, collapsed on stretcher, and became unresponsive. Pt was apneic but still with pulse. Pt has AKI, severe respiratory distress and acute respiratory failure. Pt is intubated and sedated.  Pt is on mechanical ventilator and on fentanyl/propofol infusion. Pt has been bowel movement yesterday. Insulin aspart 2 units [place] Q4H started 01/13. Pharmacy has been consulted for electrolyte monitoring and replacements.    Recent Labs: Potassium (mmol/L)  Date Value  08/02/2019 4.5   Magnesium (mg/dL)  Date Value  08/01/2019 2.5 (H)   Calcium (mg/dL)  Date Value  08/02/2019 8.3 (L)   Albumin (g/dL)  Date Value  07/27/2019 3.9   Phosphorus (mg/dL)  Date Value  07/31/2019 3.1   Sodium (mmol/L)  Date Value  08/02/2019 143     Assessment: 1. Electrolytes: Electrolytes are WNL, except for magnesium and calcium. No change since yesterday for magnesium. Calcium levels have declined since yesterday. If calcium levels continue to decline (~8), consider giving pt a calcium source, like TUMS. Continue to monitor electrolytes in am labs. Will replace potassium to goal of ~4 and magnesium to goal of ~2 while patient is intubated.    2. Glucose: Today's range: 129-146. Glucose levels are higher than yesterday, but have been trending downwards since today am. Glucose-capillary measured Q4H. Pt is on methylprednisolone 20mg  IV Q12H. Also initiated insulin aspart 2 units [place] Q4H on 01/13. Continue insulin aspart and monitor glucose levels daily.   3. Constipation: Senna-docusate (Senokot-S) and Miralax on hold today. Senna-docusate and Miralax change to PRN since pt had several loose bowel movement. Consider scheduled therapy if constipation recurs while pt is on continuous fentanyl IV.    Roanna Banning, Pharmacy  Student

## 2019-08-02 NOTE — Progress Notes (Signed)
Nutrition Follow-up  RD working remotely.  DOCUMENTATION CODES:   Obesity unspecified  INTERVENTION:  Continue Vital High Protein at 20 mL/hr (480 mL goal daily volume) + Pro-Stat 60 mL TID per tube. Provides 1080 kcal, 132 grams of protein, 403 mL H2O daily. With current propofol rate provides 1529 kcal daily.  Continue liquid MVI daily per tube.  Continue minimum free water flush of 20-30 mL Q4hrs to maintain tube patency.  NUTRITION DIAGNOSIS:   Inadequate oral intake related to inability to eat as evidenced by NPO status.  Ongoing - addressing with TF regimen.  GOAL:   Provide needs based on ASPEN/SCCM guidelines  Met with TF regimen.  MONITOR:   Vent status, Labs, Weight trends, TF tolerance, Skin, I & O's  REASON FOR ASSESSMENT:   Ventilator    ASSESSMENT:   74 year old male admitted with acute hypoxic and hypercapnic respiratory failure from acute systolic/diastolic heart failure requiring intubation on 1/8, also with sepsis and UTI.  Patient is currently intubated on ventilator support MV: 10.1 L/min Temp (24hrs), Avg:98.2 F (36.8 C), Min:97.7 F (36.5 C), Max:99 F (37.2 C)  Propofol: 17.01 ml/hr (449 kcal daily)  Medications reviewed and include: famotidine, folic acid 1 mg daily per tube, Novolog 0-15 units Q4hrs, Solu-medrol 20 mg Q12hrs IV, liquid MVI daily per tube, Miralax, senna-docusate, thiamine 500 mg daily IV from 1/13-1/18, Unasyn, fentanyl gtt, propofol gtt.  Labs reviewed: CBG 130-132, BUN 51.  Enteral Access: OGT  Diet Order:   Diet Order            Diet NPO time specified  Diet effective now             EDUCATION NEEDS:   No education needs have been identified at this time  Skin:  Skin Assessment: Skin Integrity Issues:(MSAD to breasts)  Last BM:  08/02/2019 - medium type 7  Height:   Ht Readings from Last 1 Encounters:  07/28/19 '5\' 7"'$  (1.702 m)   Weight:   Wt Readings from Last 1 Encounters:  08/02/19 112.8 kg    Ideal Body Weight:  67.3 kg  BMI:  Body mass index is 38.95 kg/m.  Estimated Nutritional Needs:   Kcal:  1265-1610 (11-14 kcal/kg)  Protein:  135 grams (2 grams/kg IBW)  Fluid:  1.7-2 L/day  Jacklynn Barnacle, MS, RD, LDN Office: [phone] Pager: [phone] After Hours/Weekend Pager: [phone]

## 2019-08-02 NOTE — Progress Notes (Addendum)
CRITICAL CARE PROGRESS NOTE    Name: Antonio [name] MRN: [id number] DOB: 10-03-1945     LOS: 5   SUBJECTIVE FINDINGS & SIGNIFICANT EVENTS   Patient description: Antonio [name] is a 74 yo male with a history of alcohol abuse, tobacco abuse and suspected history of COPD who presented to ED on 1/8 via EMS with acute respiratory distress and was admitted to the ICU.  On further workup, he was found to have Grade I diastolic dysfunction and aortic valve calcification.    Lines / Drains: PIV x2: left antecubital, right forearm Midline single lumen left arm NG/OG tube Urethral catheter ET tube x 5 days on vent  Cultures / Sepsis markers: WBC within normal range at 9.5.  Afebrile for past 24 hours.    Antibiotics: Unasyn for aspiration pnuemonia  Tests / Events: 1/9 severe resp failure acute sCHF, intubated 1/8 1/10 SAT/SBT pending, +UTI continue ABX 1/11 patient failed wean due to agitation and increased work of breathing 1/12 patient failed weaning trial 1/13 bradycardia at 48 bpm.  No elevation in troponin.  Family discussion-patient is now DNR  1/14 failed SAT/SBT due to severe delirium and WOB  Overnight: Persistent bradycardia in upper 40s and lower 50s.  CC Follow up severe acute respiratory failure  HPI Suspected history of COPD Grade I diastolic dysfunction Sedated and intubated on vent Acute kidney injury DT's  PAST MEDICAL HISTORY   Past Medical History:  Diagnosis Date  . ETOH abuse   . Smoker      SURGICAL HISTORY   History reviewed. No pertinent surgical history.   FAMILY HISTORY   No family history on file.   SOCIAL HISTORY   Social History   Tobacco Use  . Smoking status: Not on file  . Smokeless tobacco: Never Used  Substance Use Topics  . Alcohol use: Never    . Drug use: Never     MEDICATIONS   Current Medication:  Current Facility-Administered Medications:  .  0.9 %  sodium chloride infusion, 250 mL, Intravenous, PRN, Flora Lipps, MD .  acetaminophen (TYLENOL) tablet 650 mg, 650 mg, Oral, Q4H PRN, Mortimer Fries, Lauranne Beyersdorf, MD .  Ampicillin-Sulbactam (UNASYN) 3 g in sodium chloride 0.9 % 100 mL IVPB, 3 g, Intravenous, Q6H, Charlett Nose, RPH, Stopped at 08/02/19 4650 .  budesonide (PULMICORT) nebulizer solution 0.5 mg, 0.5 mg, Nebulization, BID, Madgie Dhaliwal, MD, 0.5 mg at 08/02/19 0755 .  chlordiazePOXIDE (LIBRIUM) capsule 25 mg, 25 mg, Per Tube, Q6H PRN, Flora Lipps, MD .  [COMPLETED] chlordiazePOXIDE (LIBRIUM) capsule 25 mg, 25 mg, Per Tube, QID, 25 mg at 08/01/19 2130 **FOLLOWED BY** chlordiazePOXIDE (LIBRIUM) capsule 25 mg, 25 mg, Oral, TID, 25 mg at 08/02/19 0929 **FOLLOWED BY** [START ON 08/03/2019] chlordiazePOXIDE (LIBRIUM) capsule 25 mg, 25 mg, Per Tube, BH-qamhs **FOLLOWED BY** [START ON 08/04/2019] chlordiazePOXIDE (LIBRIUM) capsule 25 mg, 25 mg, Per Tube, Daily, Milianna Ericsson, MD .  chlorhexidine gluconate (MEDLINE KIT) (PERIDEX) 0.12 % solution 15 mL, 15 mL, Mouth Rinse, BID, Tamera Pingley, MD, 15 mL at 08/02/19 0756 .  Chlorhexidine Gluconate Cloth 2 % PADS 6 each, 6 each, Topical, Daily, Flora Lipps, MD, 6 each at 08/01/19 2034 .  docusate (COLACE) 50 MG/5ML liquid 100 mg, 100 mg, Per Tube, BID PRN, Mortimer Fries, Jo Booze, MD .  enoxaparin (LOVENOX) injection 40 mg, 40 mg, Subcutaneous, Q24H, Hallaji, Sheema M, RPH, 40 mg at 08/02/19 0934 .  famotidine (PEPCID) tablet 20 mg, 20 mg, Per Tube, BID, Flora Lipps, MD, 20 mg  at 08/02/19 0928 .  feeding supplement (PRO-STAT SUGAR FREE 64) liquid 60 mL, 60 mL, Per Tube, TID, Flora Lipps, MD, 60 mL at 08/02/19 0927 .  feeding supplement (VITAL HIGH PROTEIN) liquid 1,000 mL, 1,000 mL, Per Tube, Q24H, Thalia Turkington, MD, 1,000 mL at 08/01/19 1259 .  fentaNYL (SUBLIMAZE) injection 50 mcg, 50 mcg, Intravenous,  Q15 min PRN, Flora Lipps, MD, 50 mcg at 07/29/19 1652 .  fentaNYL (SUBLIMAZE) injection 50-200 mcg, 50-200 mcg, Intravenous, Q30 min PRN, Flora Lipps, MD, 100 mcg at 07/30/19 0108 .  fentaNYL 2549mg in NS 2529m(1081mml) infusion-PREMIX, 0-400 mcg/hr, Intravenous, Continuous, Tisheena Maguire, MD, Last Rate: 30 mL/hr at 08/02/19 0830, 300 mcg/hr at 08/02/19 0830 .  folic acid (FOLVITE) tablet 1 mg, 1 mg, Per Tube, Daily, Emilina Smarr, MD, 1 mg at 08/02/19 0935 .  hydrOXYzine (ATARAX/VISTARIL) tablet 25 mg, 25 mg, Oral, Q6H PRN, KasMortimer Friesurian, MD .  insulin aspart (novoLOG) injection 0-15 Units, 0-15 Units, Subcutaneous, Q4H, SimCharlett NosePH, 2 Units at 08/02/19 0755 .  ipratropium-albuterol (DUONEB) 0.5-2.5 (3) MG/3ML nebulizer solution 3 mL, 3 mL, Nebulization, Q4H, Raia Amico, MD, 3 mL at 08/02/19 0755 .  loperamide (IMODIUM) capsule 2-4 mg, 2-4 mg, Oral, PRN, KasMortimer Friesurian, MD .  MEDLINE mouth rinse, 15 mL, Mouth Rinse, 10 times per day, KasFlora LippsD, 15 mL at 08/02/19 0935 .  methylPREDNISolone sodium succinate (SOLU-MEDROL) 40 mg/mL injection 20 mg, 20 mg, Intravenous, Q12H, Lary Eckardt, MD, 20 mg at 08/02/19 0934 .  midazolam (VERSED) injection 2 mg, 2 mg, Intravenous, Q15 min PRN, KasMortimer Friesurian, MD .  midazolam (VERSED) injection 2 mg, 2 mg, Intravenous, Q2H PRN, KasMortimer Friesurian, MD .  multivitamin liquid 15 mL, 15 mL, Oral, Daily, Jaiven Graveline, MD, 15 mL at 08/02/19 0928 .  ondansetron (ZOFRAN) injection 4 mg, 4 mg, Intravenous, Q6H PRN, KasMortimer Friesurian, MD .  polyethylene glycol (MIRALAX / GLYCOLAX) packet 17 g, 17 g, Per Tube, Daily, SimCharlett NosePH, 17 g at 08/01/19 1003 .  propofol (DIPRIVAN) 1000 MG/100ML infusion, 5-80 mcg/kg/min, Intravenous, Continuous, WilEarleen NewportD, Last Rate: 17.01 mL/hr at 08/02/19 0830, 25 mcg/kg/min at 08/02/19 0830 .  senna-docusate (Senokot-S) tablet 2 tablet, 2 tablet, Per Tube, BID, KasFlora LippsD, 2 tablet at 08/01/19 2130 .   sodium chloride flush (NS) 0.9 % injection 10-40 mL, 10-40 mL, Intracatheter, PRN, KasFlora LippsD .  thiamine 500m65m normal saline (50ml39mPB, 500 mg, Intravenous, Daily, Last Rate: 100 mL/hr at 08/02/19 0927, 500 mg at 08/02/19 0927 **FOLLOWED BY** [START ON 08/06/2019] thiamine tablet 100 mg, 100 mg, Per Tube, Daily, Richie Bonanno,Flora Lipps   ALLERGIES   Patient has no allergy information on record.    REVIEW OF SYSTEMS   Unable to obtain due to severity of patient condition.    PHYSICAL EXAMINATION   Vital Signs: Temp:  [97.7 F (36.5 C)-99 F (37.2 C)] 99 F (37.2 C) (01/14 0800) Pulse Rate:  [47-75] 53 (01/14 0900) Resp:  [14-25] 20 (01/14 0900) BP: (104-162)/(29-89) 118/37 (01/14 0900) SpO2:  [91 %-100 %] 98 % (01/14 0900) FiO2 (%):  [30 %-35 %] 30 % (01/14 0800) Weight:  [112.8 kg] 112.8 kg (01/14 0500)  GENERAL: Ill-appearing obese man.  Non-responsive to voice, touch, or noxious stimuli HEAD: Normocephalic, atraumatic.  EYES: Pupils equal, round, reactive to light.  No scleral icterus. Corneal reflex delayed.   MOUTH: Moist mucosal membrane. NECK: Supple. No thyromegaly. No nodules. No JVD.  PULMONARY: Requiring FiO2 30%, PEEP 5.  Lungs clear to auscultation.   CARDIOVASCULAR: S1 and S2. Regular rate and rhythm. No murmurs, rubs, or gallops.  GASTROINTESTINAL: Soft, nontender, non-distended. No masses. Positive bowel sounds. No hepatosplenomegaly.  MUSCULOSKELETAL: No swelling, clubbing, or edema.  NEUROLOGIC: Sedated, PERRL, corneal reflex delayed, gag reflex intact SKIN:intact,warm,dry   PERTINENT DATA     Infusions: . sodium chloride    . ampicillin-sulbactam (UNASYN) IV Stopped (08/02/19 0512)  . fentaNYL infusion INTRAVENOUS 300 mcg/hr (08/02/19 0830)  . propofol (DIPRIVAN) infusion 25 mcg/kg/min (08/02/19 0830)  . thiamine injection 500 mg (08/02/19 0927)   Scheduled Medications: . budesonide (PULMICORT) nebulizer solution  0.5 mg Nebulization BID    . chlordiazePOXIDE  25 mg Oral TID   Followed by  . [START ON 08/03/2019] chlordiazePOXIDE  25 mg Per Tube BH-qamhs   Followed by  . [START ON 08/04/2019] chlordiazePOXIDE  25 mg Per Tube Daily  . chlorhexidine gluconate (MEDLINE KIT)  15 mL Mouth Rinse BID  . Chlorhexidine Gluconate Cloth  6 each Topical Daily  . enoxaparin (LOVENOX) injection  40 mg Subcutaneous Q24H  . famotidine  20 mg Per Tube BID  . feeding supplement (PRO-STAT SUGAR FREE 64)  60 mL Per Tube TID  . feeding supplement (VITAL HIGH PROTEIN)  1,000 mL Per Tube Q24H  . folic acid  1 mg Per Tube Daily  . insulin aspart  0-15 Units Subcutaneous Q4H  . ipratropium-albuterol  3 mL Nebulization Q4H  . mouth rinse  15 mL Mouth Rinse 10 times per day  . methylPREDNISolone (SOLU-MEDROL) injection  20 mg Intravenous Q12H  . multivitamin  15 mL Oral Daily  . polyethylene glycol  17 g Per Tube Daily  . senna-docusate  2 tablet Per Tube BID  . [START ON 08/06/2019] thiamine  100 mg Per Tube Daily   PRN Medications: sodium chloride, acetaminophen, chlordiazePOXIDE, docusate, fentaNYL (SUBLIMAZE) injection, fentaNYL (SUBLIMAZE) injection, hydrOXYzine, loperamide, midazolam, midazolam, ondansetron (ZOFRAN) IV, sodium chloride flush Hemodynamic parameters:   Intake/Output: 01/13 0701 - 01/14 0700 In: 1178.9 [I.V.:828.9; IV Piggyback:350] Out: 120 [Urine:120]  Ventilator  Settings: Vent Mode: PRVC FiO2 (%):  [30 %-35 %] 30 % Set Rate:  [20 bmp] 20 bmp Vt Set:  [500 mL] 500 mL PEEP:  [5 cmH20] 5 cmH20    LAB RESULTS:  Basic Metabolic Panel: Recent Labs  Lab 07/29/19 0422 07/29/19 0422 07/30/19 6834 07/30/19 0624 07/31/19 0444 07/31/19 0444 08/01/19 0717 08/02/19 0410  NA 138  --  139  --  139  --  141 143  K 3.7   < > 3.9   < > 3.7   < > 4.3 4.5  CL 104  --  105  --  106  --  107 109  CO2 24  --  26  --  28  --  27 25  GLUCOSE 123*  --  94  --  110*  --  134* 146*  BUN 24*  --  21  --  31*  --  41* 51*   CREATININE 1.42*  --  1.36*  --  1.22  --  1.30* 1.15  CALCIUM 8.3*  --  8.3*  --  8.5*  --  8.5* 8.3*  MG  --   --  2.3  --  2.4  --  2.5*  --   PHOS  --   --  3.0  --  3.1  --   --   --    < > =  values in this interval not displayed.   Liver Function Tests: Recent Labs  Lab 07/27/19 2227  AST 30  ALT 24  ALKPHOS 76  BILITOT 0.5  PROT 7.2  ALBUMIN 3.9   No results for input(s): LIPASE, AMYLASE in the last 168 hours. No results for input(s): AMMONIA in the last 168 hours. CBC: Recent Labs  Lab 07/27/19 2227 07/27/19 2227 07/28/19 0220 07/29/19 0422 07/30/19 0624 07/31/19 0444 08/02/19 0410  WBC 19.5*   < > 9.3 10.9* 9.0 7.3 9.5  NEUTROABS 6.9  --   --   --  6.0  --   --   HGB 14.6   < > 13.2 12.5* 12.0* 12.6* 11.8*  HCT 48.2   < > 39.7 38.3* 37.6* 37.7* 36.3*  MCV 103.2*   < > 95.7 97.2 97.4 95.9 96.5  PLT 327   < > 269 221 217 212 223   < > = values in this interval not displayed.   Cardiac Enzymes: No results for input(s): CKTOTAL, CKMB, CKMBINDEX, TROPONINI in the last 168 hours. BNP: Invalid input(s): POCBNP CBG: Recent Labs  Lab 08/01/19 1923 08/01/19 1928 08/01/19 2319 08/02/19 0336 08/02/19 0747  GLUCAP 130* 135* 134* 130* 132*     IMAGING RESULTS:  Imaging:     DG Chest Port 1 View  Result Date: 08/02/2019 CLINICAL DATA:  74 year old male unsuccessful right IJ central line attempt. Query pneumothorax. EXAM: PORTABLE CHEST 1 VIEW COMPARISON:  Portable chest 07/29/2019 and earlier. FINDINGS: Portable AP semi upright view at 0323 hours. No pneumothorax. Endotracheal tube tip is just below the clavicles. Enteric tube courses to the abdomen, tip not included. Mildly larger lung volumes. Continued dense retrocardiac opacity. Stable cardiac size and mediastinal contours. Paucity of bowel gas in the upper abdomen. No acute osseous abnormality identified. IMPRESSION: 1. No pneumothorax following line placement attempt. Otherwise stable lines and tubes. 2.  Mildly larger lung volumes. Continued lower lobe collapse or consolidation. Electronically Signed   By: Genevie Ann M.D.   On: 08/02/2019 03:58   [place] EKG SITE RITE  Result Date: 08/02/2019 If Site Rite image not attached, placement could not be confirmed due to current cardiac rhythm.     ASSESSMENT AND PLAN    -Multidisciplinary rounds held today  Acute Hypoxic Respiratory Failure with COPD exacerbation, heart failure and aspiration pnuemonia -continue Full MV support on 30% FiO2 and PEEP 5 -continue Bronchodilator Therapy with budesonide and duoneb - continue solumedrol - Continue unasyn for aspiration pneumonia -Wean Fio2 and PEEP as tolerated -will perform SAT/SBT when respiratory parameters are met Has failed multiple vent weaning attempts, may need TRACH/PEG tub in the near future  Bradycardia - HR now in the low 50s.  Patient maintaining BP at 123/37 mmHg.  Serial troponins yesterday stable in the 300s.  No evidence of NSTEMI.   - Will continue to monitor.  Consider atropine if BP falls.   - Will proceed with weaning sedation.    Heart failure with Grade I diastolic dysfunction -oxygen as needed -follow up cardiac enzymes as indicated ICU monitoring  Central venous access - unable to establish central line in the IJ - assess for PICC lin next 24 hrs  Suspected Delirium Tremens - ON CIWA protocol with hydroxyzine prn (no ativan due to bradycardia) - Continue librium, thiamine, folic acid  Acute kidney injury due to ATN - Creatinine improving 1.3 yesterday to 1.15 today, BUN elevated to 51 from 40 yesterday.  Potassium stable at 4.2.   -  follow chem 7 - avoid nephrotoxic medications -follow UO -continue Foley Catheter-assess need daily  NEUROLOGY - intubated and sedated with fentanyl and propofol - minimal sedation to achieve a RASS goal: -1 Wake up assessment pending  GI/Nutrition GI PROPHYLAXIS as indicated DIET-->Continue TF's as tolerated Multiple loose BM  overnight.  Hold miralax, senocott-s today.  ENDO - ICU hypoglycemic\Hyperglycemia protocol -check FSBS per protocol   ELECTROLYTES -follow labs as needed -replace as needed -pharmacy consultation   DVT/GI PRX ordered -SCDs   - Continue lovenox TRANSFUSIONS AS NEEDED MONITOR FSBS ASSESS the need for LABS as needed   Patient remains in critical condition with multi-organ failure.  Poor prognosis.   Critical Care Time devoted to patient care services described in this note is 34 minutes.   Overall, patient is critically ill, prognosis is guarded.  Patient with Multiorgan failure and at high risk for cardiac arrest and death.   Patient is DNR, prognosis is poor, I anticipate prolonged ICU LOS   Maretta Bees Patricia Pesa, M.D.  [facility]  Medical Director [place] Director [facility] Cardio-Pulmonary Department

## 2019-08-03 DIAGNOSIS — R410 Disorientation, unspecified: Secondary | ICD-10-CM

## 2019-08-03 DIAGNOSIS — I5031 Acute diastolic (congestive) heart failure: Secondary | ICD-10-CM

## 2019-08-03 LAB — BASIC METABOLIC PANEL
Anion gap: 9 (ref 5–15)
BUN: 58 mg/dL — ABNORMAL HIGH (ref 8–23)
CO2: 24 mmol/L (ref 22–32)
Calcium: 8.3 mg/dL — ABNORMAL LOW (ref 8.9–10.3)
Chloride: 110 mmol/L (ref 98–111)
Creatinine, Ser: 1.04 mg/dL (ref 0.61–1.24)
GFR calc Af Amer: >60 mL/min (ref >60)
GFR calc non Af Amer: >60 mL/min (ref >60)
Glucose, Bld: 136 mg/dL — ABNORMAL HIGH (ref 70–99)
Potassium: 4 mmol/L (ref 3.5–5.1)
Sodium: 143 mmol/L (ref 135–145)

## 2019-08-03 LAB — GLUCOSE, CAPILLARY
Glucose-Capillary: 106 mg/dL — ABNORMAL HIGH (ref 70–99)
Glucose-Capillary: 107 mg/dL — ABNORMAL HIGH (ref 70–99)
Glucose-Capillary: 112 mg/dL — ABNORMAL HIGH (ref 70–99)
Glucose-Capillary: 121 mg/dL — ABNORMAL HIGH (ref 70–99)
Glucose-Capillary: 123 mg/dL — ABNORMAL HIGH (ref 70–99)
Glucose-Capillary: 129 mg/dL — ABNORMAL HIGH (ref 70–99)

## 2019-08-03 LAB — T4, FREE: Free T4: 0.7 ng/dL (ref 0.61–1.12)

## 2019-08-03 LAB — TSH: TSH: 0.169 u[IU]/mL — ABNORMAL LOW (ref 0.350–4.500)

## 2019-08-03 MED ORDER — FUROSEMIDE 8 MG/ML PO SOLN
40.0000 mg | Freq: Every day | ORAL | Status: DC
  Filled 2019-08-03: qty 5

## 2019-08-03 MED ORDER — PHENOBARBITAL 20 MG/5ML PO ELIX
60.0000 mg | ORAL_SOLUTION | Freq: Three times a day (TID) | ORAL | Status: AC
  Administered 2019-08-04 (×2): 60 mg via ORAL
  Filled 2019-08-03 (×6): qty 15

## 2019-08-03 MED ORDER — FUROSEMIDE 10 MG/ML PO SOLN
40.0000 mg | Freq: Once | ORAL | Status: AC
  Administered 2019-08-03: 40 mg
  Filled 2019-08-03 (×3): qty 5

## 2019-08-03 MED ORDER — DEXMEDETOMIDINE HCL IN NACL 400 MCG/100ML IV SOLN
0.4000 ug/kg/h | INTRAVENOUS | Status: DC
  Administered 2019-08-03: 1 ug/kg/h via INTRAVENOUS
  Administered 2019-08-03 – 2019-08-04 (×6): 1.2 ug/kg/h via INTRAVENOUS
  Administered 2019-08-04 (×2): 0.8 ug/kg/h via INTRAVENOUS
  Filled 2019-08-03 (×9): qty 100

## 2019-08-03 MED ORDER — AMLODIPINE BESYLATE 5 MG PO TABS
5.0000 mg | ORAL_TABLET | Freq: Every day | ORAL | Status: DC
  Administered 2019-08-03 – 2019-08-04 (×2): 5 mg
  Filled 2019-08-03 (×2): qty 1

## 2019-08-03 MED ORDER — HYDRALAZINE HCL 20 MG/ML IJ SOLN
20.0000 mg | INTRAMUSCULAR | Status: DC | PRN
  Administered 2019-08-03 – 2019-08-08 (×9): 20 mg via INTRAVENOUS
  Filled 2019-08-03 (×9): qty 1

## 2019-08-03 MED ORDER — PHENOBARBITAL 20 MG/5ML PO ELIX
60.0000 mg | ORAL_SOLUTION | Freq: Two times a day (BID) | ORAL | Status: DC
  Administered 2019-08-05: 60 mg via ORAL
  Filled 2019-08-03 (×3): qty 15

## 2019-08-03 MED ORDER — PHENOBARBITAL 20 MG/5ML PO ELIX
60.0000 mg | ORAL_SOLUTION | Freq: Four times a day (QID) | ORAL | Status: AC
  Administered 2019-08-03 (×4): 60 mg via ORAL
  Filled 2019-08-03 (×8): qty 15

## 2019-08-03 MED ORDER — PHENOBARBITAL 20 MG/5ML PO ELIX: 30.0000 mg | ORAL_SOLUTION | Freq: Two times a day (BID) | ORAL | Status: DC

## 2019-08-03 MED ORDER — AMLODIPINE 1 MG/ML ORAL SUSPENSION: 5.0000 mg | Freq: Every day | ORAL | Status: DC

## 2019-08-03 MED ORDER — LABETALOL HCL 5 MG/ML IV SOLN
10.0000 mg | INTRAVENOUS | Status: DC | PRN
  Administered 2019-08-03 (×2): 10 mg via INTRAVENOUS
  Filled 2019-08-03 (×2): qty 4

## 2019-08-03 NOTE — Progress Notes (Signed)
Per Dr Patsey Berthold, maintain BP- systolic below [id number].

## 2019-08-03 NOTE — Progress Notes (Signed)
Labetalol 10 mg given and patients blood pressure increased to 190/63. Per Dr Duwayne Heck she will come by and evaluate patient. No additional orders given. Per MD hold off on giving additional hydralazine.

## 2019-08-03 NOTE — Progress Notes (Signed)
Pharmacy Antibiotic Note  Antonio [name] is a morbidly obese 74 y.o. male admitted on 07/27/2019. EMS stated pt walked to truck, collapsed on stretcher, and became unresponsive. Pt was apneic but still with pulse. Pt has AKI, severe respiratory distress and acute respiratory failure. Pt is intubated and sedated. CBCs not collected today, but yesterday's WBC WNL. Pt had low grade fever earlier this morning (1130). No growth on BCx or UCx. Pt was on Unasyn to complete a 5 day antibiotic course for possible CAP/Aspiration Pneumonia. Pharmacy has been consulted for Unasyn dosing.  Plan: D/C Unasyn 3g IV Q6H. Last dose was given today. No further antibiotic therapy course is required at this time.   Height: 5\' 7"  (170.2 cm) Weight: 249 lb 9 oz (113.2 kg) IBW/kg (Calculated) : 66.1  Temp (24hrs), Avg:98.5 F (36.9 C), Min:98 F (36.7 C), Max:99.9 F (37.7 C)  Recent Labs  Lab 07/27/19 2227 07/27/19 2227 07/28/19 0220 07/28/19 0220 07/29/19 0422 07/29/19 0422 07/30/19 0624 07/31/19 0444 08/01/19 0717 08/02/19 0410 08/03/19 0340  WBC 19.5*   < > 9.3  --  10.9*  --  9.0 7.3  --  9.5  --   CREATININE 1.46*   < > 1.46*   < > 1.42*   < > 1.36* 1.22 1.30* 1.15 1.04  LATICACIDVEN >11.0*  --  1.5  --   --   --   --   --   --   --   --    < > = values in this interval not displayed.    Estimated Creatinine Clearance: 74.8 mL/min (by C-G formula based on SCr of 1.04 mg/dL).    Not on File  Antimicrobials this admission: Ciprofloxacin 400mg  IV 01/10 >> 01/11  Ceftriaxone 1g IV 01/11 >> 01/12 Unasyn 3g IV 01/13 >> 01/15  Dose adjustments this admission: No dosing adjustments required at this time.   Microbiology results: 01/08 BCx: No growth for 5 days (finalized 01/13) 01/08 UCx: No growth 01/09 UCx: No growth 01/11 Sputum: Rare consistent with normal respiratory flora (finalized 01/14)  01/09 MRSA PCR: Negative 01/08 SARS Coronavirus 2 by PCR: Negative  01/08 Influenza A/B by  PCR: Negative  Thank you for allowing pharmacy to be a part of this patient's care.  Roanna Banning 08/03/2019 1:22 PM

## 2019-08-03 NOTE — Progress Notes (Signed)
Dr Patsey Berthold notified of patients bp 208/61 and heart rate of 129. Patient has been off of sedation. Orders for precedex. No additional orders given at this time.

## 2019-08-03 NOTE — Progress Notes (Addendum)
CRITICAL CARE PROGRESS NOTE    Name: Antonio [name] MRN: [id number] DOB: Nov 11, 1945     LOS: 6   SUBJECTIVE FINDINGS & SIGNIFICANT EVENTS   Patient description: Antonio [name] is a 74 yo male with a history of tobacco and alcohol use who presented to the ED on 07/27/2019 as a Antonio [name] after being found down by EMS.  He was admitted to the ICU in acute respiratory distress, intubated and sedated.  On further workup, he was found to have grade I diastolic dysfunction, and acute kidney injury.    Lines / Drains: PIV x 2: Left antcubital, right anterior forearm Midline single lumen left cephalic NG/OG tube Urethral catheter ET tube on vent x 6 days  Cultures / Sepsis markers: Afebrile for past 24 hours, 98.27F.   Sputum culture demonstrated rare bacteria consistent with normal respiratory flora.  Antibiotics: Unasyn for suspected aspiration pneumonia    Protocols / Consultants: CIWA protocol  Tests / Events: 1/9 severe resp failure acute sCHF, intubated 1/8 1/10 SAT/SBT pending, +UTI continue ABX 1/11 patient failed wean due to agitation and increased work of breathing 1/12 patient failed weaning trial 1/13 bradycardia at 48 bpm.  No elevation in troponin.  Family discussion-patient is now DNR  1/14 failed SAT/SBT due to severe delirium and WOB  CC Follow up severe acute respiratory failure  HPI: Suspected history of COPD Grade I diastolic dysfunction Sedated and intubated on vent Acute kidney injury DTs DNR status   MEDICATIONS   Current Medication:  Current Facility-Administered Medications:  .  0.9 %  sodium chloride infusion, 250 mL, Intravenous, PRN, Flora Lipps, MD .  acetaminophen (TYLENOL) tablet 650 mg, 650 mg, Oral, Q4H PRN, Mortimer Fries, Kurian, MD .  budesonide (PULMICORT) nebulizer  solution 0.5 mg, 0.5 mg, Nebulization, BID, Mortimer Fries, Kurian, MD, 0.5 mg at 08/03/19 0741 .  chlordiazePOXIDE (LIBRIUM) capsule 25 mg, 25 mg, Per Tube, Q6H PRN, Flora Lipps, MD .  [COMPLETED] chlordiazePOXIDE (LIBRIUM) capsule 25 mg, 25 mg, Per Tube, QID, 25 mg at 08/01/19 2130 **FOLLOWED BY** [COMPLETED] chlordiazePOXIDE (LIBRIUM) capsule 25 mg, 25 mg, Oral, TID, 25 mg at 08/02/19 2127 **FOLLOWED BY** chlordiazePOXIDE (LIBRIUM) capsule 25 mg, 25 mg, Per Tube, BH-qamhs **FOLLOWED BY** [START ON 08/04/2019] chlordiazePOXIDE (LIBRIUM) capsule 25 mg, 25 mg, Per Tube, Daily, Kasa, Kurian, MD .  chlorhexidine gluconate (MEDLINE KIT) (PERIDEX) 0.12 % solution 15 mL, 15 mL, Mouth Rinse, BID, Kasa, Kurian, MD, 15 mL at 08/03/19 0722 .  Chlorhexidine Gluconate Cloth 2 % PADS 6 each, 6 each, Topical, Daily, Flora Lipps, MD, 6 each at 08/02/19 2054 .  docusate (COLACE) 50 MG/5ML liquid 100 mg, 100 mg, Per Tube, BID PRN, Mortimer Fries, Kurian, MD .  enoxaparin (LOVENOX) injection 40 mg, 40 mg, Subcutaneous, Q24H, Hallaji, Sheema M, RPH, 40 mg at 08/03/19 0929 .  famotidine (PEPCID) tablet 20 mg, 20 mg, Per Tube, BID, Flora Lipps, MD, 20 mg at 08/03/19 0928 .  feeding supplement (PRO-STAT SUGAR FREE 64) liquid 60 mL, 60 mL, Per Tube, TID, Mortimer Fries, Kurian, MD, 60 mL at 08/03/19 0930 .  feeding supplement (VITAL HIGH PROTEIN) liquid 1,000 mL, 1,000 mL, Per Tube, Q24H, Kasa, Kurian, MD, 1,000 mL at 08/02/19 1319 .  fentaNYL (SUBLIMAZE) injection 50 mcg, 50 mcg, Intravenous, Q15 min PRN, Flora Lipps, MD, 50 mcg at 07/29/19 1652 .  fentaNYL (SUBLIMAZE) injection 50-200 mcg, 50-200 mcg, Intravenous, Q30 min PRN, Flora Lipps, MD, 100 mcg at 07/30/19 0108 .  fentaNYL 2524mg in NS 2524m(1090mml) infusion-PREMIX,  0-400 mcg/hr, Intravenous, Continuous, Kasa, Kurian, MD, Last Rate: 27.5 mL/hr at 08/03/19 0400, 275 mcg/hr at 08/03/19 0400 .  folic acid (FOLVITE) tablet 1 mg, 1 mg, Per Tube, Daily, Kasa, Kurian, MD, 1 mg at 08/03/19  0928 .  hydrOXYzine (ATARAX/VISTARIL) tablet 25 mg, 25 mg, Oral, Q6H PRN, Mortimer Fries, Kurian, MD .  insulin aspart (novoLOG) injection 0-15 Units, 0-15 Units, Subcutaneous, Q4H, Charlett Nose, RPH, 2 Units at 08/03/19 1740 .  ipratropium-albuterol (DUONEB) 0.5-2.5 (3) MG/3ML nebulizer solution 3 mL, 3 mL, Nebulization, Q4H, Kasa, Kurian, MD, 3 mL at 08/03/19 0741 .  loperamide (IMODIUM) capsule 2-4 mg, 2-4 mg, Oral, PRN, Mortimer Fries, Kurian, MD .  MEDLINE mouth rinse, 15 mL, Mouth Rinse, 10 times per day, Flora Lipps, MD, 15 mL at 08/03/19 0930 .  methylPREDNISolone sodium succinate (SOLU-MEDROL) 40 mg/mL injection 20 mg, 20 mg, Intravenous, Q12H, Kasa, Kurian, MD, 20 mg at 08/03/19 0929 .  midazolam (VERSED) injection 2 mg, 2 mg, Intravenous, Q15 min PRN, Mortimer Fries, Kurian, MD .  midazolam (VERSED) injection 2 mg, 2 mg, Intravenous, Q2H PRN, Mortimer Fries, Kurian, MD .  multivitamin liquid 15 mL, 15 mL, Oral, Daily, Kasa, Kurian, MD, 15 mL at 08/03/19 0929 .  ondansetron (ZOFRAN) injection 4 mg, 4 mg, Intravenous, Q6H PRN, Mortimer Fries, Kurian, MD .  polyethylene glycol (MIRALAX / GLYCOLAX) packet 17 g, 17 g, Per Tube, Daily PRN, Mortimer Fries, Kurian, MD .  propofol (DIPRIVAN) 1000 MG/100ML infusion, 5-80 mcg/kg/min, Intravenous, Continuous, Earleen Newport, MD, Last Rate: 20.4 mL/hr at 08/03/19 0608, 30 mcg/kg/min at 08/03/19 0608 .  senna-docusate (Senokot-S) tablet 2 tablet, 2 tablet, Per Tube, BID PRN, Flora Lipps, MD .  sodium chloride flush (NS) 0.9 % injection 10-40 mL, 10-40 mL, Intracatheter, PRN, Flora Lipps, MD, 10 mL at 08/03/19 0929 .  thiamine '500mg'$  in normal saline (42m) IVPB, 500 mg, Intravenous, Daily, Stopped at 08/02/19 0958 **FOLLOWED BY** [START ON 08/06/2019] thiamine tablet 100 mg, 100 mg, Per Tube, Daily, KFlora Lipps MD    ALLERGIES   Patient has no allergy information on record.  REVIEW OF SYSTEMS   Unable to obtain due to severity of patients illness/mechanically intubated status.     PHYSICAL EXAMINATION   Vital Signs: Temp:  [97.8 F (36.6 C)-98.4 F (36.9 C)] 98.3 F (36.8 C) (01/15 0709) Pulse Rate:  [51-114] 63 (01/15 0709) Resp:  [10-20] 20 (01/15 0709) BP: (102-189)/(31-48) 163/45 (01/15 0709) SpO2:  [89 %-99 %] 98 % (01/15 0741) FiO2 (%):  [30 %-45 %] 40 % (01/15 0741) Weight:  [113.2 kg] 113.2 kg (01/15 0125)  GENERAL:Obese man.  Intubated and sedated.  Opens eyes to voice.  Does not track HEAD: Normocephalic, atraumatic.  EYES: Pupils equal, round, reactive to light.  No scleral icterus.  MOUTH: Orotracheally intubated, OG in place. NECK: Supple. Trachea midline, no crepitus.  Thick neck. PULMONARY: Lungs clear to auscultation bilaterally. CARDIOVASCULAR: S1 and S2. Regular rate and rhythm. Systolic murmur best appreciated at right upper sternal border.  GASTROINTESTINAL: Obese, soft,non-distended. No masses. Positive bowel sounds. MUSCULOSKELETAL: No swelling, clubbing, or edema.  NEUROLOGIC: Sedated, opens eyes to name, does not track, does not follow commands. SKIN:intact,warm,dry.   PERTINENT DATA   Infusions: . sodium chloride    . fentaNYL infusion INTRAVENOUS 275 mcg/hr (08/03/19 0400)  . propofol (DIPRIVAN) infusion 30 mcg/kg/min (08/03/19 08144  . thiamine injection Stopped (08/02/19 08185   Scheduled Medications: . budesonide (PULMICORT) nebulizer solution  0.5 mg Nebulization BID  . chlordiazePOXIDE  25 mg Per Tube  BH-qamhs   Followed by  . [START ON 08/04/2019] chlordiazePOXIDE  25 mg Per Tube Daily  . chlorhexidine gluconate (MEDLINE KIT)  15 mL Mouth Rinse BID  . Chlorhexidine Gluconate Cloth  6 each Topical Daily  . enoxaparin (LOVENOX) injection  40 mg Subcutaneous Q24H  . famotidine  20 mg Per Tube BID  . feeding supplement (PRO-STAT SUGAR FREE 64)  60 mL Per Tube TID  . feeding supplement (VITAL HIGH PROTEIN)  1,000 mL Per Tube Q24H  . folic acid  1 mg Per Tube Daily  . insulin aspart  0-15 Units Subcutaneous Q4H  .  ipratropium-albuterol  3 mL Nebulization Q4H  . mouth rinse  15 mL Mouth Rinse 10 times per day  . methylPREDNISolone (SOLU-MEDROL) injection  20 mg Intravenous Q12H  . multivitamin  15 mL Oral Daily  . [START ON 08/06/2019] thiamine  100 mg Per Tube Daily   PRN Medications: sodium chloride, acetaminophen, chlordiazePOXIDE, docusate, fentaNYL (SUBLIMAZE) injection, fentaNYL (SUBLIMAZE) injection, hydrOXYzine, loperamide, midazolam, midazolam, ondansetron (ZOFRAN) IV, polyethylene glycol, senna-docusate, sodium chloride flush Hemodynamic parameters:   Intake/Output: 01/14 0701 - 01/15 0700 In: 1735.5 [I.V.:1221.3; IV Piggyback:514.2] Out: 9381 [WEXHB:7169]  Ventilator  Settings: Vent Mode: PRVC FiO2 (%):  [30 %-45 %] 40 % Set Rate:  [20 bmp] 20 bmp Vt Set:  [500 mL] 500 mL PEEP:  [5 cmH20] 5 cmH20 Plateau Pressure:  [24 cmH20-25 cmH20] 24 cmH20     LAB RESULTS:  Basic Metabolic Panel: Recent Labs  Lab 07/30/19 0624 07/30/19 0624 07/31/19 0444 07/31/19 0444 08/01/19 0717 08/01/19 0717 08/02/19 0410 08/03/19 0340  NA 139  --  139  --  141  --  143 143  K 3.9   < > 3.7   < > 4.3   < > 4.5 4.0  CL 105  --  106  --  107  --  109 110  CO2 26  --  28  --  27  --  25 24  GLUCOSE 94  --  110*  --  134*  --  146* 136*  BUN 21  --  31*  --  41*  --  51* 58*  CREATININE 1.36*  --  1.22  --  1.30*  --  1.15 1.04  CALCIUM 8.3*  --  8.5*  --  8.5*  --  8.3* 8.3*  MG 2.3  --  2.4  --  2.5*  --   --   --   PHOS 3.0  --  3.1  --   --   --   --   --    < > = values in this interval not displayed.   Liver Function Tests: Recent Labs  Lab 07/27/19 2227  AST 30  ALT 24  ALKPHOS 76  BILITOT 0.5  PROT 7.2  ALBUMIN 3.9   No results for input(s): LIPASE, AMYLASE in the last 168 hours. No results for input(s): AMMONIA in the last 168 hours. CBC: Recent Labs  Lab 07/27/19 2227 07/27/19 2227 07/28/19 0220 07/29/19 0422 07/30/19 0624 07/31/19 0444 08/02/19 0410  WBC 19.5*   <  > 9.3 10.9* 9.0 7.3 9.5  NEUTROABS 6.9  --   --   --  6.0  --   --   HGB 14.6   < > 13.2 12.5* 12.0* 12.6* 11.8*  HCT 48.2   < > 39.7 38.3* 37.6* 37.7* 36.3*  MCV 103.2*   < > 95.7 97.2 97.4 95.9 96.5  PLT  327   < > 269 221 217 212 223   < > = values in this interval not displayed.   Cardiac Enzymes: No results for input(s): CKTOTAL, CKMB, CKMBINDEX, TROPONINI in the last 168 hours. BNP: Invalid input(s): POCBNP CBG: Recent Labs  Lab 08/02/19 1540 08/02/19 1918 08/02/19 2333 08/03/19 0324 08/03/19 0729  GLUCAP 130* 122* 118* 123* 112*     IMAGING RESULTS:  Imaging: DG Chest Port 1 View  Result Date: 08/02/2019 CLINICAL DATA:  74 year old male unsuccessful right IJ central line attempt. Query pneumothorax. EXAM: PORTABLE CHEST 1 VIEW COMPARISON:  Portable chest 07/29/2019 and earlier. FINDINGS: Portable AP semi upright view at 0323 hours. No pneumothorax. Endotracheal tube tip is just below the clavicles. Enteric tube courses to the abdomen, tip not included. Mildly larger lung volumes. Continued dense retrocardiac opacity. Stable cardiac size and mediastinal contours. Paucity of bowel gas in the upper abdomen. No acute osseous abnormality identified. IMPRESSION: 1. No pneumothorax following line placement attempt. Otherwise stable lines and tubes. 2. Mildly larger lung volumes. Continued lower lobe collapse or consolidation. Electronically Signed   By: Genevie Ann M.D.   On: 08/02/2019 03:58   [place] EKG SITE RITE  Result Date: 08/02/2019 If Site Rite image not attached, placement could not be confirmed due to current cardiac rhythm.     ASSESSMENT AND PLAN    -Multidisciplinary rounds held today  Severe Acute Hypoxic Respiratory Failure due to pulmonary edema, aspiration pneumonia and suspected history of COPD - Patient maintaining SpO2 of 99% on 40% FiO2 and PEEP 5.  Failed sedation wean for past several days due to increased work of breathing, agitation, hypertension,  tachycardia - Will try a sedation wean again today and will perform SAT/SBT when respiratory parameters are met -continue bronchodilator therapy with budesonide and duoneb - Continue unasyn for suspected aspiration pneumonia -Wean Fio2 and PEEP as tolerated - Furosemide 40 mg today -Change propofol to Precedex   Grade I diastolic dysfunction -Lasix 40 mg today  Hypertension and tachycardia - Patient became hypertensive (up to 223/79 mmHg), and tachycardic (up to 119 bpm) on sedation weaning trial.  Patient resedated with precedex, but hypertension persisted. - Labetalol had paradoxic effects of hypertension, on amlodipine via tube, as needed hydralazine  Acute kidney injury- most likely due to ATN - Improving, BUN remains elevated at 58, but creatinine continues to improve from 1.15 yesterday to 1.04 today.  Potassium stable at 4.0.  Normal urine output.   - Will continue to monitor -follow chem 7 -follow UO -continue Foley Catheter-assess need daily   NEUROLOGY - intubated and sedated with Fentanyl and propofol - failed wake up assessment yesterday, and failed again today due to increased WOB, hypertension, tachycardia. - Will change propofol to precedex  - minimal sedation to achieve a RASS goal: -1 - Wake up assessment daily   Delerium tremens - history of alcohol abuse - on folic acid, thiamine, librium - on modified CIWA protocol.  Was managing with hydroxyzine, will change to phenobarbital prn.   - Will change propofol to precedex.  GI/Nutrition GI PROPHYLAXIS as indicated DIET-->Continue TF's as tolerated Hold miralax and senocot-s due to loose bowel movements  ENDO - ICU hypoglycemic\Hyperglycemia protocol -check FSBS per protocol   ELECTROLYTES -follow labs as needed -replace as needed -pharmacy consultation   DVT/GI PRX ordered -SCDs  TRANSFUSIONS AS NEEDED MONITOR FSBS ASSESS the need for LABS as needed   Patient remains critically ill with  multi-organ failure and acute delirium.  Prognosis  is guarded.  Critical care time: 45 minutes  Jeralyn Bennett, PA-S was acting as my scribe.  Renold Don, MD  PCCM   *This note was dictated using voice recognition software/Dragon.  Despite best efforts to proofread, errors can occur which can change the meaning.  Any change was purely unintentional.

## 2019-08-03 NOTE — Progress Notes (Signed)
Dr. Patsey Berthold in to see patient, orders to give another dose of hydralazine and discontinue labetalol.

## 2019-08-03 NOTE — Consult Note (Signed)
PHARMACY CONSULT NOTE - FOLLOW UP  Pharmacy Consult for Electrolyte Monitoring and Replacement   Mareo Brownlow is a morbidly obese 74 y.o male admitted on 07/27/2019. EMS stated pt walked to truck, collapsed on stretcher, and became unresponsive. Pt was apneic but still with pulse. Pt has AKI, severe respiratory distress and acute respiratory failure. Pt is intubated and sedated, on mechanical ventilation on fentanyl/propofol infusion. Was started on precedex and phenobarbital for alcohol withdrawal today and tapering off propofol. Pt was also started on furosemide 40mg  x 1 dose today for fluid overload. Pharmacy consulted to assist in monitoring and replacing electrolytes.   Recent Labs: Potassium (mmol/L)  Date Value  08/03/2019 4.0   Magnesium (mg/dL)  Date Value  08/01/2019 2.5 (H)   Calcium (mg/dL)  Date Value  08/03/2019 8.3 (L)   Albumin (g/dL)  Date Value  07/27/2019 3.9   Phosphorus (mg/dL)  Date Value  07/31/2019 3.1   Sodium (mmol/L)  Date Value  08/03/2019 143     Assessment: 1. Electrolytes: Electrolytes are WNL except for calcium and magnesium. No changes in calcium or magnesium since yesterday. If calcium levels reach <8, give pt a calcium source, like TUMS. One dose of furosemide 40mg  per tube initiated today. Will order labs to check magnesium and potassium levels and continue monitoring electrolytes in am labs. Will replace potassium to target goal of ~4 and magnesium ~2.   2. Glucose: Today's range: 106-136. Has been declining since the am, but still higher than normal range. Glucose-capillary monitored Q4H. Continue insulin aspart [place] 0-15 units Q4H and monitor glucose levels daily.    3. Constipation: Continue senna-docusate (Senokot-S) 2 tabs per tube BID PRN and miralax daily PRN while patient is on continuous fentanyl IV.   Roanna Banning ,08/03/2019 11:49 AM Pharmacy Student

## 2019-08-03 NOTE — Progress Notes (Signed)
Spoke with Dr Duwayne Heck regarding patients blood pressure of 202/70. Blood pressure went up after labetalol dose given. MD will place orders.

## 2019-08-03 NOTE — Progress Notes (Signed)
Labetalol given for blood pressure 182/56.

## 2019-08-03 NOTE — Progress Notes (Signed)
Dr Duwayne Heck notified of patients blood pressure 177/51, 171/57, 181/51, 177/54. MD states that she will placed additional PO orders to give patient. No further orders at this time.

## 2019-08-03 NOTE — Progress Notes (Signed)
Patients blood pressure elevated. Per Dr Duwayne Heck increased precedex drip and fentanyl. Continue to monitor.

## 2019-08-04 LAB — CBC WITH DIFFERENTIAL/PLATELET
Abs Immature Granulocytes: 0.18 10*3/uL — ABNORMAL HIGH (ref 0.00–0.07)
Basophils Absolute: 0.1 10*3/uL (ref 0.0–0.1)
Basophils Relative: 1 %
Eosinophils Absolute: 0 10*3/uL (ref 0.0–0.5)
Eosinophils Relative: 0 %
HCT: 42.5 % (ref 39.0–52.0)
Hemoglobin: 13.1 g/dL (ref 13.0–17.0)
Immature Granulocytes: 2 %
Lymphocytes Relative: 20 %
Lymphs Abs: 2.1 10*3/uL (ref 0.7–4.0)
MCH: 31.4 pg (ref 26.0–34.0)
MCHC: 30.8 g/dL (ref 30.0–36.0)
MCV: 101.9 fL — ABNORMAL HIGH (ref 80.0–100.0)
Monocytes Absolute: 1.4 10*3/uL — ABNORMAL HIGH (ref 0.1–1.0)
Monocytes Relative: 13 %
Neutro Abs: 6.8 10*3/uL (ref 1.7–7.7)
Neutrophils Relative %: 64 %
Platelets: 249 10*3/uL (ref 150–400)
RBC: 4.17 MIL/uL — ABNORMAL LOW (ref 4.22–5.81)
RDW: 13.8 % (ref 11.5–15.5)
WBC: 10.5 10*3/uL (ref 4.0–10.5)
nRBC: 0 % (ref 0.0–0.2)

## 2019-08-04 LAB — GLUCOSE, CAPILLARY
Glucose-Capillary: 102 mg/dL — ABNORMAL HIGH (ref 70–99)
Glucose-Capillary: 104 mg/dL — ABNORMAL HIGH (ref 70–99)
Glucose-Capillary: 109 mg/dL — ABNORMAL HIGH (ref 70–99)
Glucose-Capillary: 109 mg/dL — ABNORMAL HIGH (ref 70–99)
Glucose-Capillary: 126 mg/dL — ABNORMAL HIGH (ref 70–99)
Glucose-Capillary: 97 mg/dL (ref 70–99)

## 2019-08-04 LAB — BASIC METABOLIC PANEL
Anion gap: 7 (ref 5–15)
BUN: 51 mg/dL — ABNORMAL HIGH (ref 8–23)
CO2: 25 mmol/L (ref 22–32)
Calcium: 8.2 mg/dL — ABNORMAL LOW (ref 8.9–10.3)
Chloride: 114 mmol/L — ABNORMAL HIGH (ref 98–111)
Creatinine, Ser: 1.08 mg/dL (ref 0.61–1.24)
GFR calc Af Amer: >60 mL/min (ref >60)
GFR calc non Af Amer: >60 mL/min (ref >60)
Glucose, Bld: 118 mg/dL — ABNORMAL HIGH (ref 70–99)
Potassium: 3.5 mmol/L (ref 3.5–5.1)
Sodium: 146 mmol/L — ABNORMAL HIGH (ref 135–145)

## 2019-08-04 LAB — MAGNESIUM: Magnesium: 2.6 mg/dL — ABNORMAL HIGH (ref 1.7–2.4)

## 2019-08-04 LAB — PHOSPHORUS: Phosphorus: 2.3 mg/dL — ABNORMAL LOW (ref 2.5–4.6)

## 2019-08-04 MED ORDER — FREE WATER
100.0000 mL | Status: DC
  Administered 2019-08-04 (×2): 100 mL

## 2019-08-04 MED ORDER — POTASSIUM CHLORIDE 20 MEQ PO PACK
40.0000 meq | PACK | Freq: Once | ORAL | Status: AC
  Administered 2019-08-04: 40 meq
  Filled 2019-08-04: qty 2

## 2019-08-04 MED ORDER — POLYETHYLENE GLYCOL 3350 17 G PO PACK
17.0000 g | PACK | Freq: Every day | ORAL | Status: DC
  Administered 2019-08-04 – 2019-08-05 (×2): 17 g
  Filled 2019-08-04 (×2): qty 1

## 2019-08-04 MED ORDER — FREE WATER
200.0000 mL | Status: DC
  Administered 2019-08-05 – 2019-08-07 (×13): 200 mL

## 2019-08-04 MED ORDER — POTASSIUM PHOSPHATES 15 MMOLE/5ML IV SOLN
10.0000 mmol | Freq: Once | INTRAVENOUS | Status: AC
  Administered 2019-08-04: 10 mmol via INTRAVENOUS
  Filled 2019-08-04: qty 3.33

## 2019-08-04 NOTE — Progress Notes (Signed)
CRITICAL CARE PROGRESS NOTE    Name: Antonio [name] MRN: [id number] DOB: [birth year]     LOS: 7   SUBJECTIVE FINDINGS & SIGNIFICANT EVENTS   Patient description: Antonio [name] is a 74 yo male with a history of tobacco and alcohol use who presented to the ED on 07/27/2019 as a Antonio [name] after being found down by EMS.  He was admitted to the ICU in acute respiratory distress, intubated and sedated.  On further workup, he was found to have grade I diastolic dysfunction, and acute kidney injury.    Lines / Drains: PIV x 2: Left antcubital, right anterior forearm Midline single lumen left cephalic NG/OG tube Urethral catheter ET tube on vent x 6 days  Cultures / Sepsis markers: Afebrile Sputum culture  consistent with normal respiratory flora.  Antibiotics: Unasyn for suspected aspiration pneumonia    Protocols / Consultants: CIWA protocol  Tests / Events: 1/9 severe resp failure acute sCHF, intubated 1/8 1/10 SAT/SBT pending, +UTI continue ABX 1/11 patient failed wean due to agitation and increased work of breathing 1/12 patient failed weaning trial 1/13 bradycardia at 48 bpm.  No elevation in troponin.  Family discussion-patient is now DNR  1/14 failed SAT/SBT due to severe delirium and WOB 1/16 Slow to awaken but no asynchrony with sedation vacation  CC Follow up severe acute respiratory failure  HPI: Suspected history of COPD Grade I diastolic dysfunction Sedated and intubated on vent Acute kidney injury DTs DNR status   MEDICATIONS   Current Medication:  Current Facility-Administered Medications:  .  0.9 %  sodium chloride infusion, 250 mL, Intravenous, PRN, Flora Lipps, MD .  acetaminophen (TYLENOL) tablet 650 mg, 650 mg, Oral, Q4H PRN, Mortimer Fries, Kurian, MD .  amLODipine (NORVASC) tablet  5 mg, 5 mg, Per Tube, Daily, Flora Lipps, MD, 5 mg at 08/04/19 0902 .  budesonide (PULMICORT) nebulizer solution 0.5 mg, 0.5 mg, Nebulization, BID, Kasa, Kurian, MD, 0.5 mg at 08/04/19 1956 .  chlorhexidine gluconate (MEDLINE KIT) (PERIDEX) 0.12 % solution 15 mL, 15 mL, Mouth Rinse, BID, Kasa, Kurian, MD, 15 mL at 08/04/19 0745 .  Chlorhexidine Gluconate Cloth 2 % PADS 6 each, 6 each, Topical, Daily, Flora Lipps, MD, 6 each at 08/03/19 2122 .  dexmedetomidine (PRECEDEX) 400 MCG/100ML (4 mcg/mL) infusion, 0.4-1.2 mcg/kg/hr, Intravenous, Titrated, Tyler Pita, MD, Stopped at 08/04/19 1414 .  docusate (COLACE) 50 MG/5ML liquid 100 mg, 100 mg, Per Tube, BID PRN, Mortimer Fries, Kurian, MD .  enoxaparin (LOVENOX) injection 40 mg, 40 mg, Subcutaneous, Q24H, Hallaji, Sheema M, RPH, 40 mg at 08/04/19 0903 .  famotidine (PEPCID) tablet 20 mg, 20 mg, Per Tube, BID, Flora Lipps, MD, 20 mg at 08/04/19 0903 .  feeding supplement (PRO-STAT SUGAR FREE 64) liquid 60 mL, 60 mL, Per Tube, TID, Flora Lipps, MD, 60 mL at 08/04/19 1610 .  feeding supplement (VITAL HIGH PROTEIN) liquid 1,000 mL, 1,000 mL, Per Tube, Q24H, Kasa, Kurian, MD, 1,000 mL at 08/04/19 1415 .  fentaNYL (SUBLIMAZE) injection 50 mcg, 50 mcg, Intravenous, Q15 min PRN, Flora Lipps, MD, 50 mcg at 07/29/19 1652 .  fentaNYL (SUBLIMAZE) injection 50-200 mcg, 50-200 mcg, Intravenous, Q30 min PRN, Flora Lipps, MD, 100 mcg at 07/30/19 0108 .  fentaNYL 2573mg in NS 25101m(1028mml) infusion-PREMIX, 0-400 mcg/hr, Intravenous, Continuous, KasFlora LippsD, Stopped at 08/04/19 1257 .  folic acid (FOLVITE) tablet 1 mg, 1 mg, Per Tube, Daily, KasFlora LippsD, 1 mg at 08/04/19 0903 .  free water 100 mL, 100  mL, Per Tube, Q4H, Kasa, Kurian, MD, 100 mL at 08/04/19 1611 .  hydrALAZINE (APRESOLINE) injection 20 mg, 20 mg, Intravenous, Q4H PRN, Tyler Pita, MD, 20 mg at 08/04/19 0524 .  insulin aspart (novoLOG) injection 0-15 Units, 0-15 Units, Subcutaneous,  Q4H, Charlett Nose, RPH, 2 Units at 08/04/19 4944 .  ipratropium-albuterol (DUONEB) 0.5-2.5 (3) MG/3ML nebulizer solution 3 mL, 3 mL, Nebulization, Q4H, Kasa, Kurian, MD, 3 mL at 08/04/19 1956 .  MEDLINE mouth rinse, 15 mL, Mouth Rinse, 10 times per day, Flora Lipps, MD, 15 mL at 08/04/19 1746 .  midazolam (VERSED) injection 2 mg, 2 mg, Intravenous, Q15 min PRN, Flora Lipps, MD .  midazolam (VERSED) injection 2 mg, 2 mg, Intravenous, Q2H PRN, Mortimer Fries, Kurian, MD .  multivitamin liquid 15 mL, 15 mL, Oral, Daily, Kasa, Kurian, MD, 15 mL at 08/04/19 0902 .  ondansetron (ZOFRAN) injection 4 mg, 4 mg, Intravenous, Q6H PRN, Flora Lipps, MD .  [COMPLETED] PHENObarbital 20 MG/5ML elixir 60 mg, 60 mg, Oral, QID, 60 mg at 08/03/19 2209 **FOLLOWED BY** PHENObarbital 20 MG/5ML elixir 60 mg, 60 mg, Oral, TID, 60 mg at 08/04/19 1746 **FOLLOWED BY** [START ON 08/05/2019] PHENObarbital 20 MG/5ML elixir 60 mg, 60 mg, Oral, BID **FOLLOWED BY** [START ON 08/06/2019] PHENObarbital 20 MG/5ML elixir 30 mg, 30 mg, Oral, BID, Vernard Gambles L, MD .  polyethylene glycol (MIRALAX / GLYCOLAX) packet 17 g, 17 g, Per Tube, Daily PRN, Mortimer Fries, Kurian, MD .  polyethylene glycol (MIRALAX / GLYCOLAX) packet 17 g, 17 g, Per Tube, Daily, Charlett Nose, RPH, 17 g at 08/04/19 1610 .  propofol (DIPRIVAN) 1000 MG/100ML infusion, 5-80 mcg/kg/min, Intravenous, Continuous, Earleen Newport, MD, Stopped at 08/03/19 0919 .  senna-docusate (Senokot-S) tablet 2 tablet, 2 tablet, Per Tube, BID PRN, Flora Lipps, MD .  sodium chloride flush (NS) 0.9 % injection 10-40 mL, 10-40 mL, Intracatheter, PRN, Flora Lipps, MD, 10 mL at 08/03/19 0929 .  thiamine '500mg'$  in normal saline (74m) IVPB, 500 mg, Intravenous, Daily, Stopped at 08/04/19 1255 **FOLLOWED BY** [START ON 08/06/2019] thiamine tablet 100 mg, 100 mg, Per Tube, Daily, KFlora Lipps MD    ALLERGIES   Patient has no allergy information on record.  REVIEW OF SYSTEMS   Unable  to obtain due to severity of patients illness/mechanically intubated status.    PHYSICAL EXAMINATION   Vital Signs: Temp:  [99.5 F (37.5 C)-100.1 F (37.8 C)] 100.1 F (37.8 C) (01/16 1500) Pulse Rate:  [64-78] 78 (01/16 1600) Resp:  [20-24] 21 (01/16 1600) BP: (104-180)/(31-122) 166/44 (01/16 1600) SpO2:  [94 %-99 %] 96 % (01/16 1958) FiO2 (%):  [30 %-40 %] 40 % (01/16 1958) Weight:  [113.8 kg] 113.8 kg (01/16 0149)  GENERAL:Obese man.  Intubated and sedated.  Opens eyes to voice and spontaneously.  Does not track HEAD: Normocephalic, atraumatic.  EYES: Pupils equal, round, reactive to light.  No scleral icterus.  MOUTH: Orotracheally intubated, OG in place. NECK: Supple. Trachea midline, no crepitus.  Thick neck. PULMONARY: Coarse breath sounds on auscultation bilaterally. CARDIOVASCULAR: S1 and S2. Regular rate and rhythm. Systolic murmur best appreciated at right upper sternal border.  GASTROINTESTINAL: Obese, soft,non-distended. No masses. Positive bowel sounds. MUSCULOSKELETAL: No joint swelling, no clubbing, no edema.   NEUROLOGIC: Sedated, opens eyes to name and spontaneously, does not track, does not follow commands. SKIN:intact,warm,dry.   PERTINENT DATA   Infusions: . sodium chloride    . dexmedetomidine (PRECEDEX) IV infusion Stopped (08/04/19 1414)  . fentaNYL infusion  INTRAVENOUS Stopped (08/04/19 1257)  . propofol (DIPRIVAN) infusion Stopped (08/03/19 0919)  . thiamine injection Stopped (08/04/19 1255)   Scheduled Medications: . amLODipine  5 mg Per Tube Daily  . budesonide (PULMICORT) nebulizer solution  0.5 mg Nebulization BID  . chlorhexidine gluconate (MEDLINE KIT)  15 mL Mouth Rinse BID  . Chlorhexidine Gluconate Cloth  6 each Topical Daily  . enoxaparin (LOVENOX) injection  40 mg Subcutaneous Q24H  . famotidine  20 mg Per Tube BID  . feeding supplement (PRO-STAT SUGAR FREE 64)  60 mL Per Tube TID  . feeding supplement (VITAL HIGH PROTEIN)  1,000 mL  Per Tube Q24H  . folic acid  1 mg Per Tube Daily  . free water  100 mL Per Tube Q4H  . insulin aspart  0-15 Units Subcutaneous Q4H  . ipratropium-albuterol  3 mL Nebulization Q4H  . mouth rinse  15 mL Mouth Rinse 10 times per day  . multivitamin  15 mL Oral Daily  . PHENObarbital  60 mg Oral TID   Followed by  . [START ON 08/05/2019] PHENObarbital  60 mg Oral BID   Followed by  . [START ON 08/06/2019] PHENObarbital  30 mg Oral BID  . polyethylene glycol  17 g Per Tube Daily  . [START ON 08/06/2019] thiamine  100 mg Per Tube Daily   PRN Medications: sodium chloride, acetaminophen, docusate, fentaNYL (SUBLIMAZE) injection, fentaNYL (SUBLIMAZE) injection, hydrALAZINE, midazolam, midazolam, ondansetron (ZOFRAN) IV, polyethylene glycol, senna-docusate, sodium chloride flush Hemodynamic parameters:   Intake/Output: 01/15 0701 - 01/16 0700 In: 9024 [I.V.:1627.8; NG/GT:1512.2; IV Piggyback:50] Out: 0973 [ZHGDJ:2426]  Ventilator  Settings: Vent Mode: PRVC FiO2 (%):  [30 %-40 %] 40 % Set Rate:  [20 bmp] 20 bmp Vt Set:  [500 mL] 500 mL PEEP:  [5 cmH20] 5 cmH20 Plateau Pressure:  [22 cmH20-24 cmH20] 24 cmH20     LAB RESULTS:  Basic Metabolic Panel: Recent Labs  Lab 07/30/19 0624 07/30/19 0624 07/31/19 0444 07/31/19 0444 08/01/19 0717 08/01/19 0717 08/02/19 0410 08/02/19 0410 08/03/19 0340 08/04/19 0345  NA 139   < > 139  --  141  --  143  --  143 146*  K 3.9   < > 3.7   < > 4.3   < > 4.5   < > 4.0 3.5  CL 105   < > 106  --  107  --  109  --  110 114*  CO2 26   < > 28  --  27  --  25  --  24 25  GLUCOSE 94   < > 110*  --  134*  --  146*  --  136* 118*  BUN 21   < > 31*  --  41*  --  51*  --  58* 51*  CREATININE 1.36*   < > 1.22  --  1.30*  --  1.15  --  1.04 1.08  CALCIUM 8.3*   < > 8.5*  --  8.5*  --  8.3*  --  8.3* 8.2*  MG 2.3  --  2.4  --  2.5*  --   --   --   --  2.6*  PHOS 3.0  --  3.1  --   --   --   --   --   --  2.3*   < > = values in this interval not displayed.    Liver Function Tests: No results for input(s): AST, ALT, ALKPHOS, BILITOT, PROT, ALBUMIN in  the last 168 hours. No results for input(s): LIPASE, AMYLASE in the last 168 hours. No results for input(s): AMMONIA in the last 168 hours. CBC: Recent Labs  Lab 07/29/19 0422 07/30/19 0624 07/31/19 0444 08/02/19 0410 08/04/19 0345  WBC 10.9* 9.0 7.3 9.5 10.5  NEUTROABS  --  6.0  --   --  6.8  HGB 12.5* 12.0* 12.6* 11.8* 13.1  HCT 38.3* 37.6* 37.7* 36.3* 42.5  MCV 97.2 97.4 95.9 96.5 101.9*  PLT 221 217 212 223 249   Cardiac Enzymes: No results for input(s): CKTOTAL, CKMB, CKMBINDEX, TROPONINI in the last 168 hours. BNP: Invalid input(s): POCBNP CBG: Recent Labs  Lab 08/03/19 2335 08/04/19 0328 08/04/19 0741 08/04/19 1141 08/04/19 1607  GLUCAP 107* 126* 102* 97 109*     IMAGING RESULTS:  Imaging: No results found.    ASSESSMENT AND PLAN    -Multidisciplinary rounds held today  Severe Acute Hypoxic Respiratory Failure due to pulmonary edema, aspiration pneumonia and suspected history of COPD - Not asynchronous when sedation is lightened however still very lethargic - Will try a sedation wean again today and will perform SAT/SBT when respiratory parameters are met - continue bronchodilator therapy with budesonide and duoneb - Continue unasyn for suspected aspiration pneumonia - Wean Fio2 and PEEP as tolerated - Currently on low-dose fentanyl only off of Precedex and propofol   Grade I diastolic dysfunction No Lasix today Monitor  Hypertension and tachycardia - Blood pressure has been better controlled on amlodipine - Labetalol had paradoxic effects of hypertension, he is on on amlodipine via tube, and as needed hydralazine  Acute kidney injury- most likely due to ATN - Improving, BUN remains elevated at 51, but creatinine 1.08 otassium stable at 3.5.  Normal urine output.   -Developing hypernatremia - Will continue to monitor -follow chem 7 -follow  UO -continue Foley Catheter-assess need daily -Increase free water   NEUROLOGY - intubated and sedated with Fentanyl  - Lethargic consider CT head - minimal sedation to achieve a RASS goal: 0 - Wake up assessment daily   Delerium tremens - history of alcohol abuse - on folic acid, thiamine, discontinue Librium - on modified CIWA protocol.  Was managing with hydroxyzine, will change to phenobarbital prn.   - Will change propofol to precedex.  GI/Nutrition GI PROPHYLAXIS as indicated DIET-->Continue TF's as tolerated Hold miralax and senocot-s due to loose bowel movements  ENDO - ICU hypoglycemic\Hyperglycemia protocol -check FSBS per protocol   ELECTROLYTES -follow labs as needed -replace as needed -pharmacy consultation   DVT/GI PRX ordered -SCDs  TRANSFUSIONS AS NEEDED MONITOR FSBS ASSESS the need for LABS as needed   Patient remains critically ill with multi-organ failure and acute delirium.  Prognosis is guarded.  Updated son Edd Arbour at bedside  Critical care time: 40 minutes   C. Derrill Kay, MD [facility]   *This note was dictated using voice recognition software/Dragon.  Despite best efforts to proofread, errors can occur which can change the meaning.  Any change was purely unintentional.

## 2019-08-04 NOTE — Consult Note (Signed)
PHARMACY CONSULT NOTE - FOLLOW UP  Pharmacy Consult for Electrolyte Monitoring and Replacement   Antonio Antonio [name] is a morbidly obese 74 y.o male admitted on 07/27/2019. EMS stated pt walked to truck, collapsed on stretcher, and became unresponsive. Pt was apneic but still with pulse. Pt has AKI, severe respiratory distress and acute respiratory failure. Pt is intubated and sedated, on mechanical ventilation on fentanyl/propofol infusion. Was started on precedex and phenobarbital for alcohol withdrawal today and tapering off propofol. Pt was also started on furosemide 40mg  x 1 dose today for fluid overload. Pharmacy consulted to assist in monitoring and replacing electrolytes.   Recent Labs: Potassium (mmol/Antonio [name])  Date Value  08/04/2019 3.5   Magnesium (mg/dL)  Date Value  08/04/2019 2.6 (H)   Calcium (mg/dL)  Date Value  08/04/2019 8.2 (Antonio [name])   Albumin (g/dL)  Date Value  07/27/2019 3.9   Phosphorus (mg/dL)  Date Value  08/04/2019 2.3 (Antonio [name])   Sodium (mmol/Antonio [name])  Date Value  08/04/2019 146 (H)     Assessment: 1. Electrolytes: Patient received potassium phospahte 10 mmol IV x 1. Will start potassium 5mEq VT x 1. Will start free water flushed 143mL Q4hr. BMP with am labs.   2. Glucose: Continue insulin aspart [name] 0-15 units Q4H and monitor glucose levels daily.    3. Constipation: Last bowel movement 1/15. Continue senna/docusate 2 tabs BID and Miralax VT Daily.   Pharmacy will continue to monitor and adjust per consult.   Antonio Antonio [name] ,08/04/2019 2:21 PM

## 2019-08-05 ENCOUNTER — Inpatient Hospital Stay: Payer: Medicare Other

## 2019-08-05 DIAGNOSIS — J9602 Acute respiratory failure with hypercapnia: Secondary | ICD-10-CM

## 2019-08-05 DIAGNOSIS — I5033 Acute on chronic diastolic (congestive) heart failure: Secondary | ICD-10-CM

## 2019-08-05 DIAGNOSIS — G9340 Encephalopathy, unspecified: Secondary | ICD-10-CM

## 2019-08-05 LAB — CBC
HCT: 44.3 % (ref 39.0–52.0)
Hemoglobin: 13.6 g/dL (ref 13.0–17.0)
MCH: 31.2 pg (ref 26.0–34.0)
MCHC: 30.7 g/dL (ref 30.0–36.0)
MCV: 101.6 fL — ABNORMAL HIGH (ref 80.0–100.0)
Platelets: 282 10*3/uL (ref 150–400)
RBC: 4.36 MIL/uL (ref 4.22–5.81)
RDW: 14.5 % (ref 11.5–15.5)
WBC: 10.6 10*3/uL — ABNORMAL HIGH (ref 4.0–10.5)
nRBC: 0 % (ref 0.0–0.2)

## 2019-08-05 LAB — GLUCOSE, CAPILLARY
Glucose-Capillary: 104 mg/dL — ABNORMAL HIGH (ref 70–99)
Glucose-Capillary: 110 mg/dL — ABNORMAL HIGH (ref 70–99)
Glucose-Capillary: 118 mg/dL — ABNORMAL HIGH (ref 70–99)
Glucose-Capillary: 119 mg/dL — ABNORMAL HIGH (ref 70–99)
Glucose-Capillary: 120 mg/dL — ABNORMAL HIGH (ref 70–99)
Glucose-Capillary: 128 mg/dL — ABNORMAL HIGH (ref 70–99)
Glucose-Capillary: 142 mg/dL — ABNORMAL HIGH (ref 70–99)

## 2019-08-05 LAB — BASIC METABOLIC PANEL
Anion gap: 8 (ref 5–15)
BUN: 56 mg/dL — ABNORMAL HIGH (ref 8–23)
CO2: 26 mmol/L (ref 22–32)
Calcium: 8.9 mg/dL (ref 8.9–10.3)
Chloride: 113 mmol/L — ABNORMAL HIGH (ref 98–111)
Creatinine, Ser: 1.08 mg/dL (ref 0.61–1.24)
GFR calc Af Amer: >60 mL/min (ref >60)
GFR calc non Af Amer: >60 mL/min (ref >60)
Glucose, Bld: 114 mg/dL — ABNORMAL HIGH (ref 70–99)
Potassium: 4.7 mmol/L (ref 3.5–5.1)
Sodium: 147 mmol/L — ABNORMAL HIGH (ref 135–145)

## 2019-08-05 IMAGING — DX DG CHEST 1V PORT
1 series · 1 of 1 positions shown · non-contrast
Comparison: One-view chest x-ray [DATE]

CLINICAL DATA: Respiratory failure.

EXAM:
PORTABLE CHEST 1 VIEW

[chest ap]
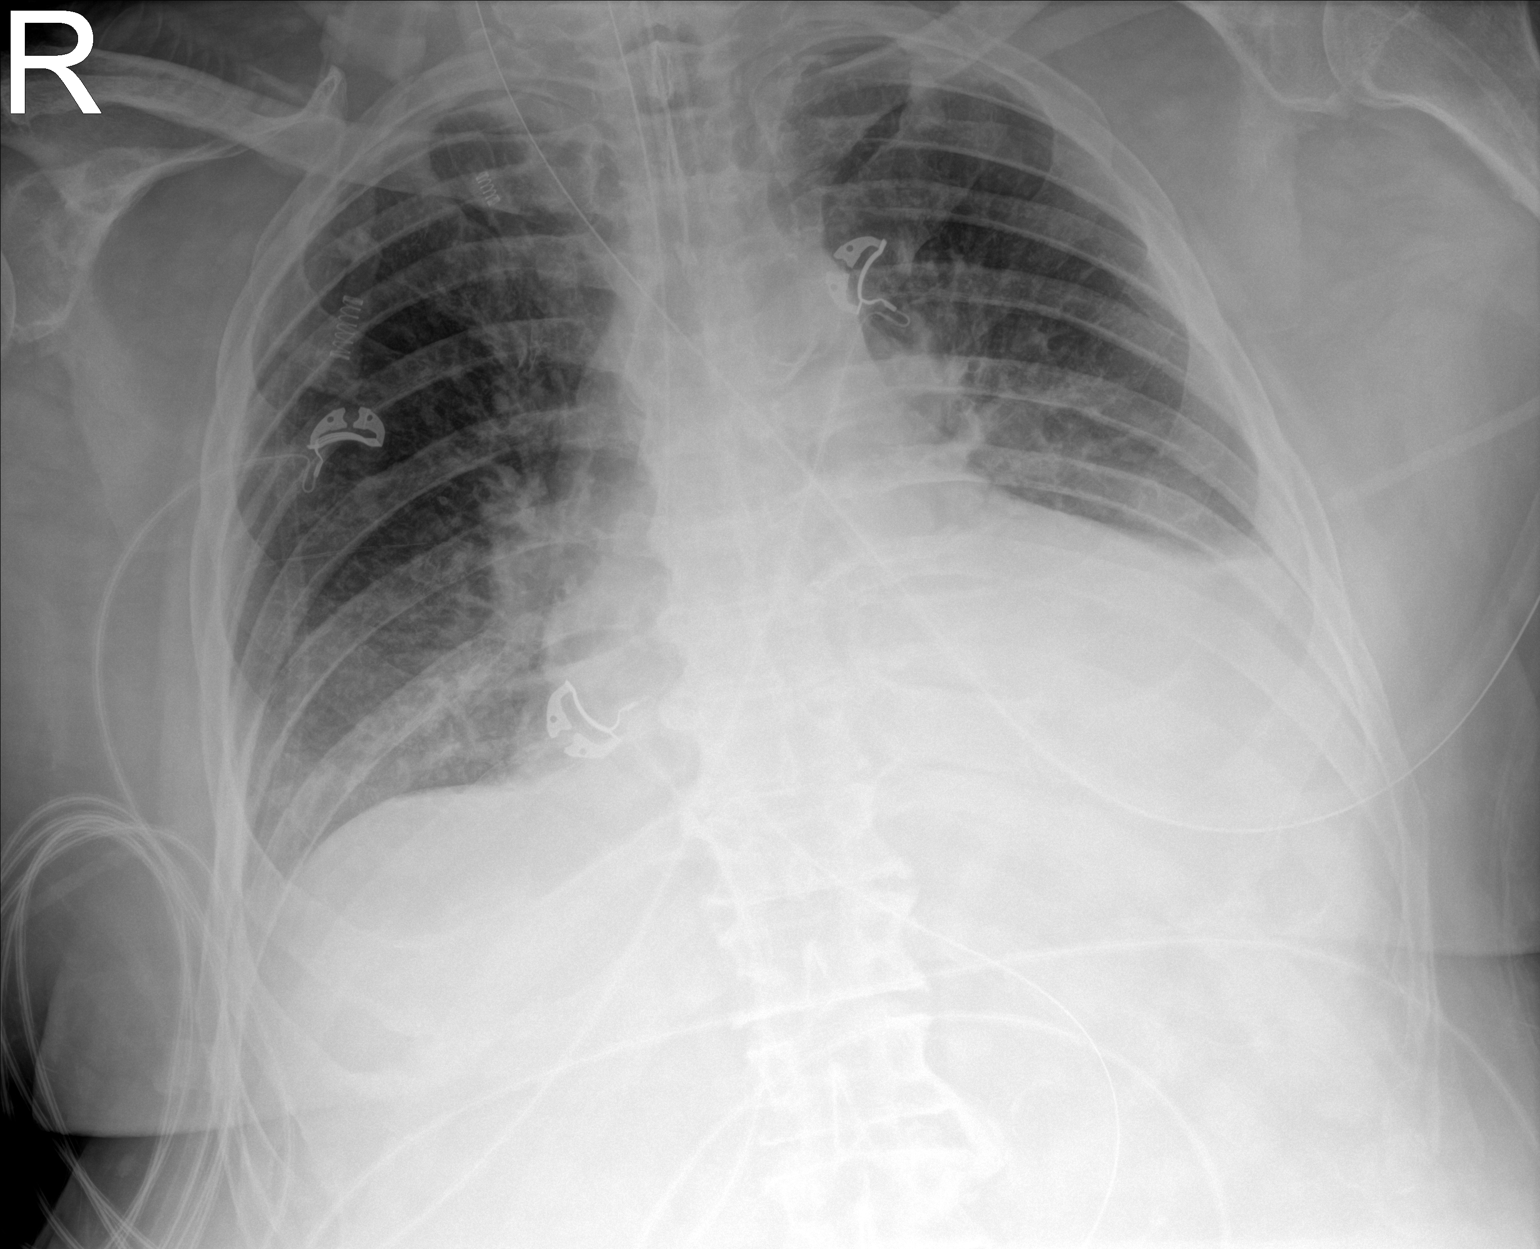

[1 of 1 positions shown; findings below may reference images not displayed]

FINDINGS: Heart size is exaggerated by low lung volumes. Endotracheal tube is
stable and in satisfactory position. NG tube courses off the
inferior border of the film.

Left greater than right pleural effusion has slightly increased.
Bibasilar airspace opacities are associated. Mild edema has slightly
increased.
IMPRESSION: 1. Increasing bilateral pleural effusions and bibasilar airspace
disease, left greater than right. This likely reflects atelectasis.
2. Increasing edema.

## 2019-08-05 MED ORDER — CLONIDINE HCL 0.1 MG PO TABS
0.1000 mg | ORAL_TABLET | Freq: Two times a day (BID) | ORAL | Status: DC
  Administered 2019-08-05 – 2019-08-06 (×3): 0.1 mg
  Filled 2019-08-05 (×3): qty 1

## 2019-08-05 MED ORDER — ETHACRYNATE SODIUM 50 MG IV SOLR
50.0000 mg | Freq: Once | INTRAVENOUS | Status: AC
  Administered 2019-08-05: 50 mg via INTRAVENOUS
  Filled 2019-08-05: qty 50

## 2019-08-05 MED ORDER — METOPROLOL TARTRATE 5 MG/5ML IV SOLN
2.5000 mg | INTRAVENOUS | Status: DC | PRN
  Administered 2019-08-05 (×2): 2.5 mg via INTRAVENOUS
  Administered 2019-08-06 – 2019-08-08 (×3): 5 mg via INTRAVENOUS
  Filled 2019-08-05 (×5): qty 5

## 2019-08-05 MED ORDER — NITROGLYCERIN 2 % TD OINT
1.0000 [in_us] | TOPICAL_OINTMENT | Freq: Four times a day (QID) | TRANSDERMAL | Status: DC
  Filled 2019-08-05: qty 1

## 2019-08-05 MED ORDER — NITROGLYCERIN 2 % TD OINT
0.5000 [in_us] | TOPICAL_OINTMENT | Freq: Four times a day (QID) | TRANSDERMAL | Status: DC
  Administered 2019-08-05: 0.5 [in_us] via TOPICAL
  Filled 2019-08-05: qty 1

## 2019-08-05 MED ORDER — AMLODIPINE BESYLATE 10 MG PO TABS
10.0000 mg | ORAL_TABLET | Freq: Every day | ORAL | Status: DC
  Administered 2019-08-05 – 2019-08-11 (×7): 10 mg
  Filled 2019-08-05 (×8): qty 1

## 2019-08-05 MED ORDER — NITROGLYCERIN 2 % TD OINT
0.5000 [in_us] | TOPICAL_OINTMENT | Freq: Four times a day (QID) | TRANSDERMAL | Status: DC
  Administered 2019-08-06 – 2019-08-11 (×22): 0.5 [in_us] via TOPICAL
  Filled 2019-08-05 (×22): qty 1

## 2019-08-05 MED ORDER — CLONIDINE ORAL SUSPENSION 10 MCG/ML: 0.1000 mg | Freq: Two times a day (BID) | ORAL | Status: DC

## 2019-08-05 NOTE — Consult Note (Signed)
PHARMACY CONSULT NOTE - FOLLOW UP  Pharmacy Consult for Electrolyte Monitoring and Replacement   Antonio [name] is a morbidly obese 74 y.o male admitted on 07/27/2019. EMS stated pt walked to truck, collapsed on stretcher, and became unresponsive. Pt was apneic but still with pulse. Pt has AKI, severe respiratory distress and acute respiratory failure. Pt is intubated and sedated, on mechanical ventilation on fentanyl/propofol infusion. Was started on precedex and phenobarbital for alcohol withdrawal today and tapering off propofol. Pharmacy consulted to assist in monitoring and replacing electrolytes.   Recent Labs: Potassium (mmol/L)  Date Value  08/05/2019 4.7   Magnesium (mg/dL)  Date Value  08/04/2019 2.6 (H)   Calcium (mg/dL)  Date Value  08/05/2019 8.9   Albumin (g/dL)  Date Value  07/27/2019 3.9   Phosphorus (mg/dL)  Date Value  08/04/2019 2.3 (L)   Sodium (mmol/L)  Date Value  08/05/2019 147 (H)     Assessment: 1. Electrolytes: Free water increased to 211mL Q4hr. Patient given dose of ethacrynic acid. Will defer phosphorus replacement today in setting of mild hypernatremia and potassium of 4.7. Labs in am  2. Glucose: Continue insulin aspart [place] 0-15 units Q4H and monitor glucose levels daily.    3. Constipation: Last bowel movement 1/17. Continue senna/docusate 2 tabs BID and Miralax VT Daily.   Pharmacy will continue to monitor and adjust per consult.   Bayne Fosnaugh L ,08/05/2019 11:56 AM

## 2019-08-05 NOTE — Progress Notes (Addendum)
Quiet day.Sedation stopped for wake up at 0800. And never restarted. Opened eyes to voice. Gag and cough noted.  Moved toes only bilaterally to pain. Did not move arms to pain. Corneal reflex noted bilaterally. Had 3 large liquid BMs in 5 minutes. Flexiseal placed. Immediately put 100 mls of stool out. 1339 heart rhythm changed to AFib./Flutter, treated x 2 with metoprolol. B/P treated x 2 with Hydralazine.

## 2019-08-05 NOTE — Progress Notes (Signed)
CRITICAL CARE PROGRESS NOTE    Name: Antonio [name] MRN: [id number] DOB: 12/26/1945     LOS: 8   SUBJECTIVE FINDINGS & SIGNIFICANT EVENTS   Patient description: Antonio [name] is a 74 yo male with a history of tobacco and alcohol use who presented to the ED on 07/27/2019 as a Antonio [name] after being found down by EMS.  He was admitted to the ICU in acute respiratory distress, intubated and sedated.  On further workup, he was found to have grade I diastolic dysfunction, moderate aortic stenosis and acute kidney injury.    Lines / Drains: PIV x 2: Left antcubital, right anterior forearm Midline single lumen left cephalic NG/OG tube Urethral catheter ET tube on vent x 6 days  Cultures / Sepsis markers: Afebrile Sputum culture  consistent with normal respiratory flora.  Antibiotics: Completed Unasyn for suspected aspiration pneumonia    Protocols / Consultants: CIWA protocol  Tests / Events: 1/9 severe resp failure acute sCHF, intubated 1/8 1/10 SAT/SBT pending, +UTI continue ABX 1/11 patient failed wean due to agitation and increased work of breathing 1/12 patient failed weaning trial 1/13 bradycardia at 48 bpm.  No elevation in troponin. Family discussion-patient is DNR  1/14 failed SAT/SBT due to severe delirium and WOB 1/16 Slow to awaken but no asynchrony with sedation vacation 1/17 developed A. fib flutter with controlled ventricular response  CC Follow up severe acute respiratory failure  HPI: Suspected history of COPD Grade I diastolic dysfunction Moderate aortic stenosis Sedated and intubated on vent Acute kidney injury DTs DNR status   MEDICATIONS   Current Medication:  Current Facility-Administered Medications:  .  0.9 %  sodium chloride infusion, 250 mL, Intravenous, PRN, Flora Lipps, MD .  acetaminophen (TYLENOL) tablet 650 mg, 650 mg, Oral, Q4H PRN, Mortimer Fries, Kurian, MD .  amLODipine (NORVASC) tablet 10 mg, 10 mg, Per Tube, Daily, Awilda Bill, NP, 10 mg at 08/05/19 0329 .  budesonide (PULMICORT) nebulizer solution 0.5 mg, 0.5 mg, Nebulization, BID, Kasa, Kurian, MD, 0.5 mg at 08/05/19 0738 .  chlorhexidine gluconate (MEDLINE KIT) (PERIDEX) 0.12 % solution 15 mL, 15 mL, Mouth Rinse, BID, Kasa, Kurian, MD, 15 mL at 08/05/19 0808 .  Chlorhexidine Gluconate Cloth 2 % PADS 6 each, 6 each, Topical, Daily, Flora Lipps, MD, 6 each at 08/04/19 2122 .  dexmedetomidine (PRECEDEX) 400 MCG/100ML (4 mcg/mL) infusion, 0.4-1.2 mcg/kg/hr, Intravenous, Titrated, Tyler Pita, MD, Stopped at 08/04/19 1414 .  docusate (COLACE) 50 MG/5ML liquid 100 mg, 100 mg, Per Tube, BID PRN, Mortimer Fries, Kurian, MD .  enoxaparin (LOVENOX) injection 40 mg, 40 mg, Subcutaneous, Q24H, Hallaji, Sheema M, RPH, 40 mg at 08/05/19 0805 .  famotidine (PEPCID) tablet 20 mg, 20 mg, Per Tube, BID, Flora Lipps, MD, 20 mg at 08/05/19 0806 .  feeding supplement (PRO-STAT SUGAR FREE 64) liquid 60 mL, 60 mL, Per Tube, TID, Flora Lipps, MD, 60 mL at 08/05/19 0807 .  feeding supplement (VITAL HIGH PROTEIN) liquid 1,000 mL, 1,000 mL, Per Tube, Q24H, Kasa, Kurian, MD, 1,000 mL at 08/04/19 1415 .  fentaNYL (SUBLIMAZE) injection 50 mcg, 50 mcg, Intravenous, Q15 min PRN, Flora Lipps, MD, 50 mcg at 07/29/19 1652 .  fentaNYL (SUBLIMAZE) injection 50-200 mcg, 50-200 mcg, Intravenous, Q30 min PRN, Flora Lipps, MD, 100 mcg at 07/30/19 0108 .  fentaNYL 257mg in NS 256m(1032mml) infusion-PREMIX, 0-400 mcg/hr, Intravenous, Continuous, Kasa, Kurian, MD, Last Rate: 10 mL/hr at 08/05/19 0654, 100 mcg/hr at 08/05/19 0654 .  folic acid (FOLVITE)  tablet 1 mg, 1 mg, Per Tube, Daily, Flora Lipps, MD, 1 mg at 08/05/19 0806 .  free water 200 mL, 200 mL, Per Tube, Q4H, Tyler Pita, MD, 200 mL at 08/05/19 0807 .  hydrALAZINE  (APRESOLINE) injection 20 mg, 20 mg, Intravenous, Q4H PRN, Tyler Pita, MD, 20 mg at 08/05/19 7209 .  insulin aspart (novoLOG) injection 0-15 Units, 0-15 Units, Subcutaneous, Q4H, Charlett Nose, RPH, 2 Units at 08/04/19 4709 .  ipratropium-albuterol (DUONEB) 0.5-2.5 (3) MG/3ML nebulizer solution 3 mL, 3 mL, Nebulization, Q4H, Kasa, Kurian, MD, 3 mL at 08/05/19 0738 .  MEDLINE mouth rinse, 15 mL, Mouth Rinse, 10 times per day, Flora Lipps, MD, 15 mL at 08/05/19 0600 .  midazolam (VERSED) injection 2 mg, 2 mg, Intravenous, Q15 min PRN, Mortimer Fries, Kurian, MD .  midazolam (VERSED) injection 2 mg, 2 mg, Intravenous, Q2H PRN, Mortimer Fries, Kurian, MD .  multivitamin liquid 15 mL, 15 mL, Oral, Daily, Kasa, Kurian, MD, 15 mL at 08/05/19 0805 .  nitroGLYCERIN (NITROGLYN) 2 % ointment 1 inch, 1 inch, Topical, Q6H, Blakeney, Dana G, NP .  ondansetron (ZOFRAN) injection 4 mg, 4 mg, Intravenous, Q6H PRN, Flora Lipps, MD .  [COMPLETED] PHENObarbital 20 MG/5ML elixir 60 mg, 60 mg, Oral, QID, 60 mg at 08/03/19 2209 **FOLLOWED BY** PHENObarbital 20 MG/5ML elixir 60 mg, 60 mg, Oral, TID, 60 mg at 08/04/19 1746 **FOLLOWED BY** PHENObarbital 20 MG/5ML elixir 60 mg, 60 mg, Oral, BID **FOLLOWED BY** [START ON 08/06/2019] PHENObarbital 20 MG/5ML elixir 30 mg, 30 mg, Oral, BID, Vernard Gambles L, MD .  polyethylene glycol (MIRALAX / GLYCOLAX) packet 17 g, 17 g, Per Tube, Daily PRN, Mortimer Fries, Kurian, MD .  polyethylene glycol (MIRALAX / GLYCOLAX) packet 17 g, 17 g, Per Tube, Daily, Charlett Nose, RPH, 17 g at 08/05/19 0805 .  senna-docusate (Senokot-S) tablet 2 tablet, 2 tablet, Per Tube, BID PRN, Flora Lipps, MD .  sodium chloride flush (NS) 0.9 % injection 10-40 mL, 10-40 mL, Intracatheter, PRN, Flora Lipps, MD, 10 mL at 08/03/19 0929 .  thiamine '500mg'$  in normal saline (72m) IVPB, 500 mg, Intravenous, Daily, Stopped at 08/04/19 1255 **FOLLOWED BY** [START ON 08/06/2019] thiamine tablet 100 mg, 100 mg, Per Tube, Daily,  KFlora Lipps MD    ALLERGIES   Patient has no allergy information on record.  REVIEW OF SYSTEMS   Unable to obtain due to severity of patients illness/mechanically intubated status.    PHYSICAL EXAMINATION   Vital Signs: Temp:  [98.7 F (37.1 C)-100.1 F (37.8 C)] 99 F (37.2 C) (01/17 0400) Pulse Rate:  [49-110] 49 (01/17 0600) Resp:  [18-28] 20 (01/17 0600) BP: (132-198)/(28-122) 196/41 (01/17 0600) SpO2:  [95 %-97 %] 97 % (01/17 0600) FiO2 (%):  [30 %-40 %] 40 % (01/17 0845) Weight:  [115.4 kg] 115.4 kg (01/17 0500)  GENERAL:Obese man.  Intubated and sedated.  Opens eyes to voice and spontaneously.  Does not track HEAD: Normocephalic, atraumatic.  EYES: Pupils equal, round, reactive to light.  No scleral icterus.  MOUTH: Orotracheally intubated, OG in place. NECK: Supple. Trachea midline, no crepitus.  Thick neck. PULMONARY: Coarse breath sounds on auscultation bilaterally. CARDIOVASCULAR: S1 and S2. Regular rate and rhythm.  Harsh AS murmur  GASTROINTESTINAL: Obese, soft,non-distended. No masses. Positive bowel sounds. MUSCULOSKELETAL: No joint swelling, no clubbing, no edema.   NEUROLOGIC: Sedated, opens eyes to name and spontaneously, does not track, does not follow commands. SKIN:intact,warm,dry.   PERTINENT DATA   Infusions: . sodium chloride    .  dexmedetomidine (PRECEDEX) IV infusion Stopped (08/04/19 1414)  . fentaNYL infusion INTRAVENOUS 100 mcg/hr (08/05/19 0654)  . thiamine injection Stopped (08/04/19 1255)   Scheduled Medications: . amLODipine  10 mg Per Tube Daily  . budesonide (PULMICORT) nebulizer solution  0.5 mg Nebulization BID  . chlorhexidine gluconate (MEDLINE KIT)  15 mL Mouth Rinse BID  . Chlorhexidine Gluconate Cloth  6 each Topical Daily  . enoxaparin (LOVENOX) injection  40 mg Subcutaneous Q24H  . famotidine  20 mg Per Tube BID  . feeding supplement (PRO-STAT SUGAR FREE 64)  60 mL Per Tube TID  . feeding supplement (VITAL HIGH  PROTEIN)  1,000 mL Per Tube Q24H  . folic acid  1 mg Per Tube Daily  . free water  200 mL Per Tube Q4H  . insulin aspart  0-15 Units Subcutaneous Q4H  . ipratropium-albuterol  3 mL Nebulization Q4H  . mouth rinse  15 mL Mouth Rinse 10 times per day  . multivitamin  15 mL Oral Daily  . nitroGLYCERIN  1 inch Topical Q6H  . PHENObarbital  60 mg Oral TID   Followed by  . PHENObarbital  60 mg Oral BID   Followed by  . [START ON 08/06/2019] PHENObarbital  30 mg Oral BID  . polyethylene glycol  17 g Per Tube Daily  . [START ON 08/06/2019] thiamine  100 mg Per Tube Daily   PRN Medications: sodium chloride, acetaminophen, docusate, fentaNYL (SUBLIMAZE) injection, fentaNYL (SUBLIMAZE) injection, hydrALAZINE, midazolam, midazolam, ondansetron (ZOFRAN) IV, polyethylene glycol, senna-docusate, sodium chloride flush Hemodynamic parameters:   Intake/Output: 01/16 0701 - 01/17 0700 In: 1013.2 [I.V.:237.2; NG/GT:472.9; IV Piggyback:303.1] Out: 2270 [Urine:2270]  Ventilator  Settings: Vent Mode: Spontaneous FiO2 (%):  [30 %-40 %] 40 % Set Rate:  [20 bmp] 20 bmp Vt Set:  [500 mL] 500 mL PEEP:  [5 cmH20] 5 cmH20 Pressure Support:  [15 cmH20] 15 cmH20 Plateau Pressure:  [21 QJJ94-17 cmH20] 21 cmH20     LAB RESULTS:  Basic Metabolic Panel: Recent Labs  Lab 07/30/19 0624 07/30/19 0624 07/31/19 0444 07/31/19 0444 08/01/19 0717 08/01/19 0717 08/02/19 0410 08/02/19 0410 08/03/19 0340 08/03/19 0340 08/04/19 0345 08/05/19 0632  NA 139   < > 139   < > 141  --  143  --  143  --  146* 147*  K 3.9   < > 3.7   < > 4.3   < > 4.5   < > 4.0   < > 3.5 4.7  CL 105   < > 106   < > 107  --  109  --  110  --  114* 113*  CO2 26   < > 28   < > 27  --  25  --  24  --  25 26  GLUCOSE 94   < > 110*   < > 134*  --  146*  --  136*  --  118* 114*  BUN 21   < > 31*   < > 41*  --  51*  --  58*  --  51* 56*  CREATININE 1.36*   < > 1.22   < > 1.30*  --  1.15  --  1.04  --  1.08 1.08  CALCIUM 8.3*   < > 8.5*   <  > 8.5*  --  8.3*  --  8.3*  --  8.2* 8.9  MG 2.3  --  2.4  --  2.5*  --   --   --   --   --  2.6*  --   PHOS 3.0  --  3.1  --   --   --   --   --   --   --  2.3*  --    < > = values in this interval not displayed.   Liver Function Tests: No results for input(s): AST, ALT, ALKPHOS, BILITOT, PROT, ALBUMIN in the last 168 hours. No results for input(s): LIPASE, AMYLASE in the last 168 hours. No results for input(s): AMMONIA in the last 168 hours. CBC: Recent Labs  Lab 07/30/19 0624 07/31/19 0444 08/02/19 0410 08/04/19 0345 08/05/19 0632  WBC 9.0 7.3 9.5 10.5 10.6*  NEUTROABS 6.0  --   --  6.8  --   HGB 12.0* 12.6* 11.8* 13.1 13.6  HCT 37.6* 37.7* 36.3* 42.5 44.3  MCV 97.4 95.9 96.5 101.9* 101.6*  PLT 217 212 223 249 282   Cardiac Enzymes: No results for input(s): CKTOTAL, CKMB, CKMBINDEX, TROPONINI in the last 168 hours. BNP: Invalid input(s): POCBNP CBG: Recent Labs  Lab 08/04/19 1607 08/04/19 2038 08/04/19 2346 08/05/19 0324 08/05/19 0746  GLUCAP 109* 104* 109* 110* 119*     IMAGING RESULTS:  Imaging: DG Chest Port 1 View  Result Date: 08/05/2019 CLINICAL DATA:  Respiratory failure. EXAM: PORTABLE CHEST 1 VIEW COMPARISON:  One-view chest x-ray 08/02/2019 FINDINGS: Heart size is exaggerated by low lung volumes. Endotracheal tube is stable and in satisfactory position. NG tube courses off the inferior border of the film. Left greater than right pleural effusion has slightly increased. Bibasilar airspace opacities are associated. Mild edema has slightly increased. IMPRESSION: 1. Increasing bilateral pleural effusions and bibasilar airspace disease, left greater than right. This likely reflects atelectasis. 2. Increasing edema. Electronically Signed   By: San Morelle M.D.   On: 08/05/2019 06:41      ASSESSMENT AND PLAN    -Multidisciplinary rounds held today  Severe Acute Hypoxic Respiratory Failure due to pulmonary edema, aspiration pneumonia and suspected  history of COPD - Not asynchronous when sedation is lightened however still very lethargic - Will try a sedation wean again today and will perform SAT/SBT when respiratory parameters are met - continue bronchodilator therapy with budesonide and duoneb -Completed unasyn for suspected aspiration pneumonia - Wean Fio2 and PEEP as tolerated - Currently on low-dose fentanyl only off of Precedex and propofol - Consider CT head if mentation does not improve   Grade I diastolic dysfunction Aortic stenosis, moderate Ethacrynic acid x1 Monitor  Hypertension and tachycardia - On amlodipine, added clonidine, as needed labetalol for heart rate control   Acute kidney injury- most likely due to ATN - Improving, BUN remains elevated at 51, but creatinine 1.08 otassium stable at 3.5.  Normal urine output.   -Developing hypernatremia - Will continue to monitor -follow chem 7 -follow UO -continue Foley Catheter-assess need daily -Increase free water   NEUROLOGY - intubated and sedated with Fentanyl  - Lethargic consider CT head if no improvement - Had been on phenobarb for alcohol withdrawal agitation: Will d/c - minimal sedation to achieve a RASS goal: 0 - Wake up assessment daily   Delerium tremens - history of alcohol abuse - on folic acid, thiamine, discontinued Librium,discontinue phenobarbital - Off Precedex  GI/Nutrition GI PROPHYLAXIS as indicated DIET-->Continue TF's as tolerated Hold miralax and senocot-s due to loose bowel movements, antibiotics DC'd  ENDO - ICU hypoglycemic\Hyperglycemia protocol -check FSBS per protocol   ELECTROLYTES -follow labs as needed -replace as needed -pharmacy consultation   DVT/GI PRX ordered -  SCDs  TRANSFUSIONS AS NEEDED MONITOR FSBS ASSESS the need for LABS as needed   Patient remains critically ill with multi-organ failure and encephalopathy.  Prognosis is guarded.   Critical care time: 40 minutes   C. Derrill Kay,  MD [facility]   *This note was dictated using voice recognition software/Dragon.  Despite best efforts to proofread, errors can occur which can change the meaning.  Any change was purely unintentional.

## 2019-08-06 LAB — CBC WITH DIFFERENTIAL/PLATELET
Abs Immature Granulocytes: 0.18 10*3/uL — ABNORMAL HIGH (ref 0.00–0.07)
Basophils Absolute: 0.1 10*3/uL (ref 0.0–0.1)
Basophils Relative: 1 %
Eosinophils Absolute: 0 10*3/uL (ref 0.0–0.5)
Eosinophils Relative: 0 %
HCT: 43.9 % (ref 39.0–52.0)
Hemoglobin: 14 g/dL (ref 13.0–17.0)
Immature Granulocytes: 2 %
Lymphocytes Relative: 11 %
Lymphs Abs: 1.3 10*3/uL (ref 0.7–4.0)
MCH: 31.5 pg (ref 26.0–34.0)
MCHC: 31.9 g/dL (ref 30.0–36.0)
MCV: 98.9 fL (ref 80.0–100.0)
Monocytes Absolute: 0.9 10*3/uL (ref 0.1–1.0)
Monocytes Relative: 7 %
Neutro Abs: 9.8 10*3/uL — ABNORMAL HIGH (ref 1.7–7.7)
Neutrophils Relative %: 79 %
Platelets: 257 10*3/uL (ref 150–400)
RBC: 4.44 MIL/uL (ref 4.22–5.81)
RDW: 14.5 % (ref 11.5–15.5)
WBC: 12.3 10*3/uL — ABNORMAL HIGH (ref 4.0–10.5)
nRBC: 0 % (ref 0.0–0.2)

## 2019-08-06 LAB — GLUCOSE, CAPILLARY
Glucose-Capillary: 128 mg/dL — ABNORMAL HIGH (ref 70–99)
Glucose-Capillary: 144 mg/dL — ABNORMAL HIGH (ref 70–99)
Glucose-Capillary: 126 mg/dL — ABNORMAL HIGH (ref 70–99)
Glucose-Capillary: 124 mg/dL — ABNORMAL HIGH (ref 70–99)
Glucose-Capillary: 123 mg/dL — ABNORMAL HIGH (ref 70–99)

## 2019-08-06 LAB — PHOSPHORUS: Phosphorus: 3.3 mg/dL (ref 2.5–4.6)

## 2019-08-06 LAB — BASIC METABOLIC PANEL
Anion gap: 11 (ref 5–15)
BUN: 58 mg/dL — ABNORMAL HIGH (ref 8–23)
CO2: 23 mmol/L (ref 22–32)
Calcium: 9 mg/dL (ref 8.9–10.3)
Chloride: 115 mmol/L — ABNORMAL HIGH (ref 98–111)
Creatinine, Ser: 1.1 mg/dL (ref 0.61–1.24)
GFR calc Af Amer: >60 mL/min (ref >60)
GFR calc non Af Amer: >60 mL/min (ref >60)
Glucose, Bld: 147 mg/dL — ABNORMAL HIGH (ref 70–99)
Potassium: 4.4 mmol/L (ref 3.5–5.1)
Sodium: 149 mmol/L — ABNORMAL HIGH (ref 135–145)

## 2019-08-06 LAB — MAGNESIUM: Magnesium: 2.6 mg/dL — ABNORMAL HIGH (ref 1.7–2.4)

## 2019-08-06 MED ORDER — FUROSEMIDE 10 MG/ML IJ SOLN
4.0000 mg/h | INTRAVENOUS | Status: DC
  Administered 2019-08-06: 8 mg/h via INTRAVENOUS
  Administered 2019-08-07: 4 mg/h via INTRAVENOUS
  Filled 2019-08-06: qty 25
  Filled 2019-08-06: qty 10
  Filled 2019-08-06: qty 25

## 2019-08-06 MED ORDER — ZINC OXIDE 40 % EX OINT
TOPICAL_OINTMENT | Freq: Two times a day (BID) | CUTANEOUS | Status: DC
  Filled 2019-08-06: qty 113

## 2019-08-06 MED ORDER — HYDROCERIN EX CREA
TOPICAL_CREAM | Freq: Two times a day (BID) | CUTANEOUS | Status: DC
  Filled 2019-08-06: qty 113

## 2019-08-06 NOTE — Consult Note (Signed)
[place] for Electrolyte Monitoring and Replacement   Antonio [name] is a morbidly obese 74 y.o male admitted on 07/27/2019. EMS stated pt walked to truck, collapsed on stretcher, and became unresponsive. Pt was apneic but still with pulse. Pt has AKI, severe respiratory distress and acute respiratory failure. Pt is intubated and sedated, on mechanical ventilation on fentanyl/propofol infusion. Was started on precedex and phenobarbital for alcohol withdrawal, now off phenobarbital. Pharmacy consulted to assist in monitoring and replacing electrolytes.   Recent Labs: Potassium (mmol/L)  Date Value  08/06/2019 4.4   Magnesium (mg/dL)  Date Value  08/06/2019 2.6 (H)   Calcium (mg/dL)  Date Value  08/06/2019 9.0   Albumin (g/dL)  Date Value  07/27/2019 3.9   Phosphorus (mg/dL)  Date Value  08/06/2019 3.3   Sodium (mmol/L)  Date Value  08/06/2019 149 (H)     Assessment: 1. Electrolytes: Patient started on Lasix drip at 8 mg/hr per intensivist. No electrolyte replacement earlier today, but expect potassium to start to drop with Lasix. Will f/u BMP at 1900 today. Electrolytes with am labs.  2. Glucose: Continue insulin aspart [place] 0-15 units Q4H and monitor glucose levels daily.    3. Constipation: Last bowel movement 1/17. Senna/docusate 2 tabs BID and Miralax VT Daily on hold. Will continue to follow and add PRN.   Pharmacy will continue to monitor and adjust per consult.   Tawnya Crook, PharmD

## 2019-08-06 NOTE — Progress Notes (Addendum)
CRITICAL CARE PROGRESS NOTE    Name: Antonio [name] MRN: [id number] DOB: 1946/03/17     LOS: 49   SUBJECTIVE FINDINGS & SIGNIFICANT EVENTS   Patient description: Antonio [name] is a 74 yo male with a history of alcohol abuse, tobacco abuse and suspected COPD who presented to the ED on 07/27/2019 as a [name] after being found down by EMS.  He was admitted to the ICU in acute respiratory distress, intubated and sedated.  On further workup, he was found to have a grade I diastolic dysfunction, moderate aortic stenosis and AKI.     Lines / Drains: Midline single lumen left cephalic PIV right forearm NG/OG tube Urethral catheter Rectal tube ET tube x 9 days  Cultures / Sepsis markers: Respiratory culture 1/11 consistent with normal respiratory flora.  Negative for MRSA, Influenza A/B, Sars-cov-2.  Blood cultures 1/8 showed no growth at 5 days.    Patient remains afebrile at 98.35F.     Protocols / Consultants: CIWA protocol  Tests / Events: 1/9 severe resp failure acute sCHF, intubated 1/8 1/10 SAT/SBT pending, +UTI continue ABX 1/11 patient failed wean due to agitation and increased work of breathing 1/12 patient failed weaning trial 1/13 bradycardia at 48 bpm. No elevation in troponin. Family discussion-patient is DNR  1/14 failed SAT/SBT due to severe delirium and WOB 1/16 Slow to awaken but no asynchrony with sedation vacation 1/17 developed A. fib flutter with controlled ventricular response 1/8 patient remains in anasarca - now on lasix gtt  CC  Follow up severe acute respiratory failure  HPI Suspected history of COPD Grade I diastolic dysfunction Moderate aortic stenosis Sedated and intubated on vent Acute kidney injury  DTs DNR status A fib/flutter on    PAST MEDICAL HISTORY   Past  Medical History:  Diagnosis Date  . ETOH abuse   . Smoker      SURGICAL HISTORY   History reviewed. No pertinent surgical history.   FAMILY HISTORY   No family history on file.   SOCIAL HISTORY   Social History   Tobacco Use  . Smoking status: Not on file  . Smokeless tobacco: Never Used  Substance Use Topics  . Alcohol use: Never  . Drug use: Never     MEDICATIONS   Current Medication:  Current Facility-Administered Medications:  .  0.9 %  sodium chloride infusion, 250 mL, Intravenous, PRN, Flora Lipps, MD, Last Rate: 5 mL/hr at 08/06/19 0904, 250 mL at 08/06/19 0904 .  acetaminophen (TYLENOL) tablet 650 mg, 650 mg, Oral, Q4H PRN, Mortimer Fries, Kurian, MD .  amLODipine (NORVASC) tablet 10 mg, 10 mg, Per Tube, Daily, Awilda Bill, NP, 10 mg at 08/06/19 1054 .  budesonide (PULMICORT) nebulizer solution 0.5 mg, 0.5 mg, Nebulization, BID, Kasa, Kurian, MD, 0.5 mg at 08/06/19 0759 .  chlorhexidine gluconate (MEDLINE KIT) (PERIDEX) 0.12 % solution 15 mL, 15 mL, Mouth Rinse, BID, Kasa, Kurian, MD, 15 mL at 08/06/19 0830 .  Chlorhexidine Gluconate Cloth 2 % PADS 6 each, 6 each, Topical, Daily, Flora Lipps, MD, 6 each at 08/05/19 2104 .  cloNIDine (CATAPRES) tablet 0.1 mg, 0.1 mg, Per Tube, BID, Mortimer Fries, Kurian, MD, 0.1 mg at 08/06/19 1056 .  enoxaparin (LOVENOX) injection 40 mg, 40 mg, Subcutaneous, Q24H, Hallaji, Sheema M, RPH, 40 mg at 08/06/19 1058 .  famotidine (PEPCID) tablet 20 mg, 20 mg, Per Tube, BID, Mortimer Fries, Kurian, MD, 20 mg at 08/06/19 1056 .  feeding supplement (PRO-STAT SUGAR FREE 64) liquid 60 mL,  60 mL, Per Tube, TID, Flora Lipps, MD, 60 mL at 08/06/19 1059 .  feeding supplement (VITAL HIGH PROTEIN) liquid 1,000 mL, 1,000 mL, Per Tube, Q24H, Kasa, Kurian, MD, 1,000 mL at 08/04/19 1415 .  fentaNYL (SUBLIMAZE) injection 50 mcg, 50 mcg, Intravenous, Q15 min PRN, Flora Lipps, MD, 50 mcg at 07/29/19 1652 .  fentaNYL (SUBLIMAZE) injection 50-200 mcg, 50-200 mcg,  Intravenous, Q30 min PRN, Flora Lipps, MD, 100 mcg at 08/06/19 0208 .  fentaNYL 2553mg in NS 2597m(1084mml) infusion-PREMIX, 0-400 mcg/hr, Intravenous, Continuous, Kasa, Kurian, MD, Last Rate: 10 mL/hr at 08/05/19 0654, 100 mcg/hr at 08/05/19 0654 .  folic acid (FOLVITE) tablet 1 mg, 1 mg, Per Tube, Daily, Kasa, Kurian, MD, 1 mg at 08/06/19 1052 .  free water 200 mL, 200 mL, Per Tube, Q4H, GonTyler PitaD, 200 mL at 08/06/19 1107 .  hydrALAZINE (APRESOLINE) injection 20 mg, 20 mg, Intravenous, Q4H PRN, GonTyler PitaD, 20 mg at 08/06/19 0105 .  hydrocerin (EUCERIN) cream, , Topical, BID, Sony Schlarb, MD .  insulin aspart (novoLOG) injection 0-15 Units, 0-15 Units, Subcutaneous, Q4H, SimCharlett NosePH, 2 Units at 08/06/19 0830 .  ipratropium-albuterol (DUONEB) 0.5-2.5 (3) MG/3ML nebulizer solution 3 mL, 3 mL, Nebulization, Q4H, Kasa, Kurian, MD, 3 mL at 08/06/19 0759 .  MEDLINE mouth rinse, 15 mL, Mouth Rinse, 10 times per day, KasFlora LippsD, 15 mL at 08/06/19 1055 .  metoprolol tartrate (LOPRESSOR) injection 2.5-5 mg, 2.5-5 mg, Intravenous, Q4H PRN, GonTyler PitaD, 2.5 mg at 08/05/19 1408 .  midazolam (VERSED) injection 2 mg, 2 mg, Intravenous, Q15 min PRN, KasFlora LippsD, 2 mg at 08/05/19 2147 .  midazolam (VERSED) injection 2 mg, 2 mg, Intravenous, Q2H PRN, KasFlora LippsD .  multivitamin liquid 15 mL, 15 mL, Oral, Daily, Kasa, Kurian, MD, 15 mL at 08/06/19 1055 .  nitroGLYCERIN (NITROGLYN) 2 % ointment 0.5 inch, 0.5 inch, Topical, Q6H, Blakeney, DanDreama SaaP, 0.5 inch at 08/06/19 0602 .  ondansetron (ZOFRAN) injection 4 mg, 4 mg, Intravenous, Q6H PRN, KasMortimer Friesurian, MD .  sodium chloride flush (NS) 0.9 % injection 10-40 mL, 10-40 mL, Intracatheter, PRN, KasFlora LippsD, 10 mL at 08/03/19 0929 .  [COMPLETED] thiamine 500m2m normal saline (50ml48mPB, 500 mg, Intravenous, Daily, Last Rate: 100 mL/hr at 08/05/19 1033, 500 mg at 08/05/19 1033 **FOLLOWED BY**  thiamine tablet 100 mg, 100 mg, Per Tube, Daily, Kasa, Kurian, MD, 100 mg at 08/06/19 1055    ALLERGIES   Patient has no allergy information on record.    REVIEW OF SYSTEMS    Unable to obtain due to severity of patient condition.  PHYSICAL EXAMINATION   Vital Signs: Temp:  [98.8 F (37.1 C)-99.3 F (37.4 C)] 98.8 F (37.1 C) (01/18 0500) Pulse Rate:  [58-106] 99 (01/18 0600) Resp:  [13-27] 21 (01/18 0600) BP: (104-190)/(41-69) 173/42 (01/18 1056) SpO2:  [94 %-98 %] 96 % (01/18 0759) FiO2 (%):  [40 %] 40 % (01/18 0759) Weight:  [112 [283112 kg (01/18 0423)  GENERAL:Patient is an ill-appearing obese man.  Intubated and sedated HEAD: Normocephalic, atraumatic.  EYES: Pupils equal, round, slowly reactive to light.  No scleral icterus.  MOUTH: Moist mucosal membrane. NECK: Supple. No thyromegaly. No nodules. No JVD.  PULMONARY: Wheezing and rhonchi present bilaterally diminished in bilateral lower lobes CARDIOVASCULAR: S1 and S2. Regular rate and rhythm. Distant heart sounds.  Systolic murmur at right upper sternal border consistent with aortic stenosis. GASTROINTESTINAL:  Soft, nontender, non-distended. No masses. Positive bowel sounds. No hepatosplenomegaly.  MUSCULOSKELETAL: Edema in bilateral fingers.   NEUROLOGIC: Obtunded.  Pupils slowly reactive. Corneal reflex sluggish.  Cough reflex intact.  Patient does not follow commands.  Makes non-purposeful movements of extremities.   SKIN:intact,warm,dry   PERTINENT DATA     Infusions: . sodium chloride 250 mL (08/06/19 0904)  . fentaNYL infusion INTRAVENOUS 100 mcg/hr (08/05/19 0654)   Scheduled Medications: . amLODipine  10 mg Per Tube Daily  . budesonide (PULMICORT) nebulizer solution  0.5 mg Nebulization BID  . chlorhexidine gluconate (MEDLINE KIT)  15 mL Mouth Rinse BID  . Chlorhexidine Gluconate Cloth  6 each Topical Daily  . cloNIDine  0.1 mg Per Tube BID  . enoxaparin (LOVENOX) injection  40 mg Subcutaneous  Q24H  . famotidine  20 mg Per Tube BID  . feeding supplement (PRO-STAT SUGAR FREE 64)  60 mL Per Tube TID  . feeding supplement (VITAL HIGH PROTEIN)  1,000 mL Per Tube Q24H  . folic acid  1 mg Per Tube Daily  . free water  200 mL Per Tube Q4H  . hydrocerin   Topical BID  . insulin aspart  0-15 Units Subcutaneous Q4H  . ipratropium-albuterol  3 mL Nebulization Q4H  . mouth rinse  15 mL Mouth Rinse 10 times per day  . multivitamin  15 mL Oral Daily  . nitroGLYCERIN  0.5 inch Topical Q6H  . thiamine  100 mg Per Tube Daily   PRN Medications: sodium chloride, acetaminophen, fentaNYL (SUBLIMAZE) injection, fentaNYL (SUBLIMAZE) injection, hydrALAZINE, metoprolol tartrate, midazolam, midazolam, ondansetron (ZOFRAN) IV, sodium chloride flush Hemodynamic parameters:   Intake/Output: 01/17 0701 - 01/18 0700 In: -  Out: 3000 [Urine:2700; Stool:300]  Ventilator  Settings: Vent Mode: PRVC FiO2 (%):  [40 %] 40 % Set Rate:  [20 bmp] 20 bmp Vt Set:  [500 mL] 500 mL PEEP:  [5 cmH20] 5 cmH20 Pressure Support:  [15 cmH20] 15 cmH20 Plateau Pressure:  [19 cmH20-22 cmH20] 21 cmH20   LAB RESULTS:  Basic Metabolic Panel: Recent Labs  Lab 07/31/19 0444 07/31/19 0444 08/01/19 0717 08/01/19 0717 08/02/19 0410 08/02/19 0410 08/03/19 0340 08/03/19 0340 08/04/19 0345 08/04/19 0345 08/05/19 0632 08/06/19 0732  NA 139   < > 141   < > 143  --  143  --  146*  --  147* 149*  K 3.7   < > 4.3   < > 4.5   < > 4.0   < > 3.5   < > 4.7 4.4  CL 106   < > 107   < > 109  --  110  --  114*  --  113* 115*  CO2 28   < > 27   < > 25  --  24  --  25  --  26 23  GLUCOSE 110*   < > 134*   < > 146*  --  136*  --  118*  --  114* 147*  BUN 31*   < > 41*   < > 51*  --  58*  --  51*  --  56* 58*  CREATININE 1.22   < > 1.30*   < > 1.15  --  1.04  --  1.08  --  1.08 1.10  CALCIUM 8.5*   < > 8.5*   < > 8.3*  --  8.3*  --  8.2*  --  8.9 9.0  MG 2.4  --  2.5*  --   --   --   --   --  2.6*  --   --  2.6*  PHOS 3.1  --    --   --   --   --   --   --  2.3*  --   --  3.3   < > = values in this interval not displayed.   Liver Function Tests: No results for input(s): AST, ALT, ALKPHOS, BILITOT, PROT, ALBUMIN in the last 168 hours. No results for input(s): LIPASE, AMYLASE in the last 168 hours. No results for input(s): AMMONIA in the last 168 hours. CBC: Recent Labs  Lab 07/31/19 0444 08/02/19 0410 08/04/19 0345 08/05/19 0632 08/06/19 0732  WBC 7.3 9.5 10.5 10.6* 12.3*  NEUTROABS  --   --  6.8  --  9.8*  HGB 12.6* 11.8* 13.1 13.6 14.0  HCT 37.7* 36.3* 42.5 44.3 43.9  MCV 95.9 96.5 101.9* 101.6* 98.9  PLT 212 223 249 282 257   Cardiac Enzymes: No results for input(s): CKTOTAL, CKMB, CKMBINDEX, TROPONINI in the last 168 hours. BNP: Invalid input(s): POCBNP CBG: Recent Labs  Lab 08/05/19 2006 08/05/19 2337 08/06/19 0417 08/06/19 0721 08/06/19 1112  GLUCAP 118* 120* 128* 144* 126*     IMAGING RESULTS:  Imaging: DG Chest Port 1 View  Result Date: 08/05/2019 CLINICAL DATA:  Respiratory failure. EXAM: PORTABLE CHEST 1 VIEW COMPARISON:  One-view chest x-ray 08/02/2019 FINDINGS: Heart size is exaggerated by low lung volumes. Endotracheal tube is stable and in satisfactory position. NG tube courses off the inferior border of the film. Left greater than right pleural effusion has slightly increased. Bibasilar airspace opacities are associated. Mild edema has slightly increased. IMPRESSION: 1. Increasing bilateral pleural effusions and bibasilar airspace disease, left greater than right. This likely reflects atelectasis. 2. Increasing edema. Electronically Signed   By: San Morelle M.D.   On: 08/05/2019 06:41      ASSESSMENT AND PLAN    -Multidisciplinary rounds held today  Acute Hypoxic Respiratory Failure due to pulmonary edema, bilateral pleural effusions, Grade I diastolic heart failure and suspected history of COPD - Currently maintaining SpO2 96% on 30% FiO2, PEEP 5.  CT 1/17  demonstrated increasing bilateral pleural effusions and bibasilar airspace disease and increasing pulmonary edema.   -continue Full MV support -continue Bronchodilator Therapy with budesonide and duoneb -Wean Fio2 and PEEP as tolerated -will perform SAT/SBT when respiratory parameters are met - Begin furosemide drip 250 mg in D5W infusion   CARDIAC FAILURE with grade I diastolic dysfunction -oxygen as needed -Lasix as tolerated -follow up cardiac enzymes as indicated - Continue topical nitroglycerin ICU monitoring  Hypertension - BP continues to stay in the 175/37 mmHg range. - amlodipine 10 mg daily - Metoprolol  Q4H prn for hypertension - Clonodine 0.1 mg BID  Delirium Tremens - History of alcohol abuse - Continue thiamine 462 mg daily, folic acid 1 mg daily - Modified CIWA protocol with hydralazine  Acute kidney injury- Most likely due to ATN - BUN 58, Creatinine 1.10, GFR >60, patient continues to have appropriate urine output in foley - Will continue to monitor  Hypernatremia and hyperchloridemia - Sodium increased to 149 today from 147 yesterday.  Chloride increased from 113 yesterday to 115 today. - Continue free water 200 mL Q4H  NEUROLOGY - intubated and sedated with fentanyl 100 mcg - minimal sedation to achieve a RASS goal: -1 Wake up assessment pending - Patient remains obtunded after discontinuing sedation with sluggish pupils.  Is not following commands.  - Due to mental status, hypertension, and widened  pulse pressure, consider MRI head.  GI/Nutrition GI PROPHYLAXIS as indicated DIET-->Continue TF's as tolerated Patient continues to have loose bowel movements- rectal tube in place.  Hold stool softeners.    ENDO - ICU hypoglycemic\Hyperglycemia protocol -check FSBS per protocol - Continue insulin sliding scale  Intertrigo in groin - Begin application of hydrocerin cream BID. - Will continue nursing wound care and bedding changes to reduce the risk of  further breakdown.   Goals of care - Patient DNR status  - Patient has been intubated for 9 days.  Plan family meeting in the next couple of days to discuss patient prognosis and goals of care including PEG and trach placement.  ELECTROLYTES -follow labs as needed -replace as needed  -pharmacy consultation   DVT/GI PRX ordered - Lovenox 40 mg QD for DVT prophylaxis TRANSFUSIONS AS NEEDED MONITOR FSBS ASSESS the need for LABS as needed   Critical care provider statement:    Critical care time (minutes):  110   Critical care time was exclusive of:  Separately billable procedures and treating other patients   Critical care was necessary to treat or prevent imminent or life-threatening deterioration of the following conditions:  Acute respiratory failure, Delerium tremens   Critical care was time spent personally by me on the following activities:  Development of treatment plan with patient or surrogate, discussions with consultants, evaluation of patient's response to treatment, examination of patient, obtaining history from patient or surrogate, ordering and performing treatments and interventions, ordering and review of laboratory studies and re-evaluation of patient's condition.  I assumed direction of critical care for this patient from another provider in my specialty: no    Patient remains critically ill with multi-organ failure.  At risk of developing cardiac arrest and death. Patient currently DNR status.    Ottie Glazier, M.D.  Division of [place]

## 2019-08-07 ENCOUNTER — Other Ambulatory Visit: Payer: Self-pay

## 2019-08-07 ENCOUNTER — Encounter: Payer: Self-pay | Admitting: Internal Medicine

## 2019-08-07 ENCOUNTER — Inpatient Hospital Stay: Payer: Medicare Other

## 2019-08-07 LAB — CBC WITH DIFFERENTIAL/PLATELET
Abs Immature Granulocytes: 0.22 10*3/uL — ABNORMAL HIGH (ref 0.00–0.07)
Basophils Absolute: 0.1 10*3/uL (ref 0.0–0.1)
Basophils Relative: 1 %
Eosinophils Absolute: 0.1 10*3/uL (ref 0.0–0.5)
Eosinophils Relative: 1 %
HCT: 43 % (ref 39.0–52.0)
Hemoglobin: 14.1 g/dL (ref 13.0–17.0)
Immature Granulocytes: 2 %
Lymphocytes Relative: 13 %
Lymphs Abs: 1.5 10*3/uL (ref 0.7–4.0)
MCH: 32 pg (ref 26.0–34.0)
MCHC: 32.8 g/dL (ref 30.0–36.0)
MCV: 97.7 fL (ref 80.0–100.0)
Monocytes Absolute: 1.1 10*3/uL — ABNORMAL HIGH (ref 0.1–1.0)
Monocytes Relative: 9 %
Neutro Abs: 8.6 10*3/uL — ABNORMAL HIGH (ref 1.7–7.7)
Neutrophils Relative %: 74 %
Platelets: 282 10*3/uL (ref 150–400)
RBC: 4.4 MIL/uL (ref 4.22–5.81)
RDW: 14.2 % (ref 11.5–15.5)
WBC: 11.5 10*3/uL — ABNORMAL HIGH (ref 4.0–10.5)
nRBC: 0.2 % (ref 0.0–0.2)

## 2019-08-07 LAB — GLUCOSE, CAPILLARY
Glucose-Capillary: 100 mg/dL — ABNORMAL HIGH (ref 70–99)
Glucose-Capillary: 111 mg/dL — ABNORMAL HIGH (ref 70–99)
Glucose-Capillary: 111 mg/dL — ABNORMAL HIGH (ref 70–99)
Glucose-Capillary: 115 mg/dL — ABNORMAL HIGH (ref 70–99)
Glucose-Capillary: 118 mg/dL — ABNORMAL HIGH (ref 70–99)
Glucose-Capillary: 128 mg/dL — ABNORMAL HIGH (ref 70–99)
Glucose-Capillary: 129 mg/dL — ABNORMAL HIGH (ref 70–99)

## 2019-08-07 LAB — BASIC METABOLIC PANEL
Anion gap: 9 (ref 5–15)
BUN: 54 mg/dL — ABNORMAL HIGH (ref 8–23)
CO2: 29 mmol/L (ref 22–32)
Calcium: 8.8 mg/dL — ABNORMAL LOW (ref 8.9–10.3)
Chloride: 114 mmol/L — ABNORMAL HIGH (ref 98–111)
Creatinine, Ser: 1.01 mg/dL (ref 0.61–1.24)
GFR calc Af Amer: >60 mL/min (ref >60)
GFR calc non Af Amer: >60 mL/min (ref >60)
Glucose, Bld: 111 mg/dL — ABNORMAL HIGH (ref 70–99)
Potassium: 3.6 mmol/L (ref 3.5–5.1)
Sodium: 152 mmol/L — ABNORMAL HIGH (ref 135–145)

## 2019-08-07 LAB — MAGNESIUM: Magnesium: 2.1 mg/dL (ref 1.7–2.4)

## 2019-08-07 IMAGING — MR MR HEAD WO/W CM
12 series · 48 of 48 positions shown · IV contrast (10ml Gadavist)
Comparison: None.

CLINICAL DATA: Found down

EXAM:
MRI HEAD WITHOUT AND WITH CONTRAST
TECHNIQUE: Multiplanar, multiecho pulse sequences of the brain and surrounding
structures were obtained without and with intravenous contrast.
CONTRAST:  10mL GADAVIST GADOBUTROL 1 MMOL/ML IV SOLN

[Series 2: ax dwi_tracew · axial · 3.0mm · 1.31mm/px · z∈[-80,+60]mm · 7 of 48 slices shown]
[im 1/48]
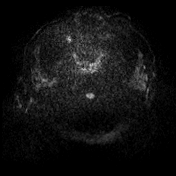
[im 8/48]
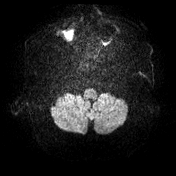
[im 16/48]
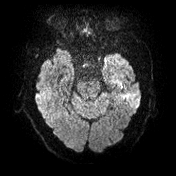
[im 24/48]
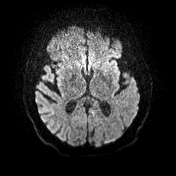
[im 32/48]
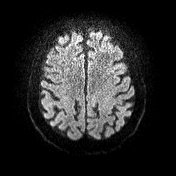
[im 40/48]
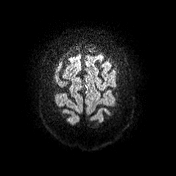
[im 48/48]
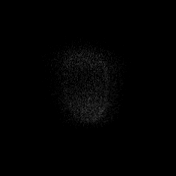

[Series 3: ax dwi_adc · axial · 3.0mm · 1.31mm/px · z∈[-80,+60]mm · 7 of 48 slices shown]
[im 1/48]
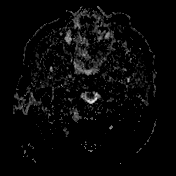
[im 8/48]
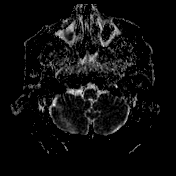
[im 16/48]
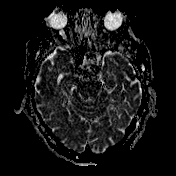
[im 24/48]
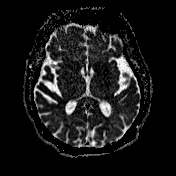
[im 32/48]
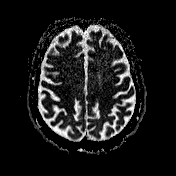
[im 40/48]
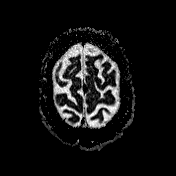
[im 48/48]
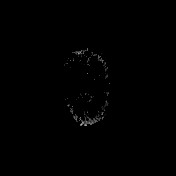

[Series 4: cor dwi_tracew · coronal · 5.0mm · 1.31mm/px · 4 of 38 slices shown]
[im 1/38]
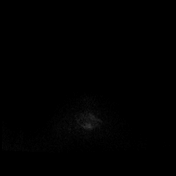
[im 13/38]
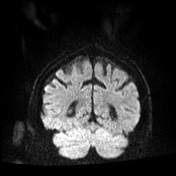
[im 25/38]
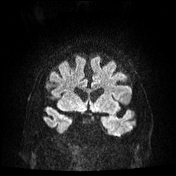
[im 38/38]
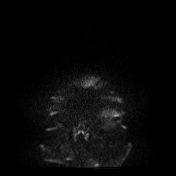

[Series 5: cor dwi_adc · coronal · 5.0mm · 1.31mm/px · 4 of 38 slices shown]
[im 1/38]
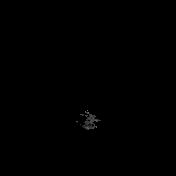
[im 13/38]
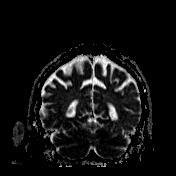
[im 25/38]
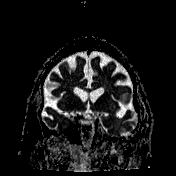
[im 38/38]
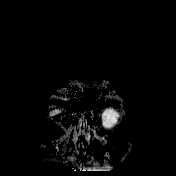

[Series 6: T1 · sagittal · 5.0mm · 0.94mm/px · 3 of 23 slices shown (1 of 2)]
[im 1/23]
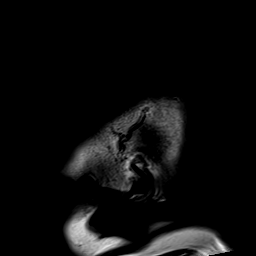
[im 12/23]
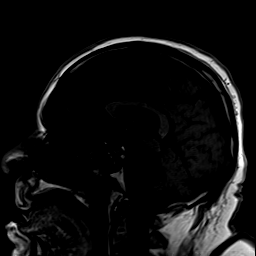
[im 23/23]
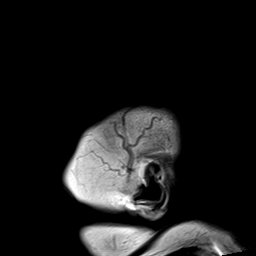

[Series 7: T2 · axial · 5.0mm · 0.45mm/px · z∈[-95,+60]mm · 3 of 29 slices shown (1 of 2)]
[im 1/29]
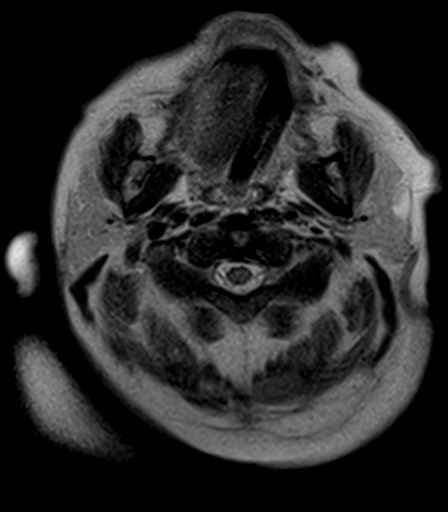
[im 15/29]
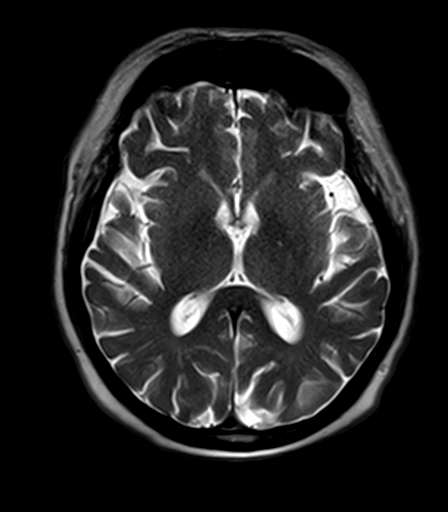
[im 29/29]
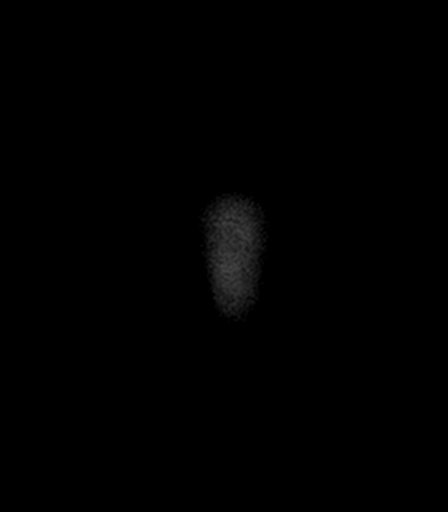

[Series 8: T2-star · axial · 5.0mm · 0.45mm/px · z∈[-95,+60]mm · 3 of 29 slices shown]
[im 1/29]
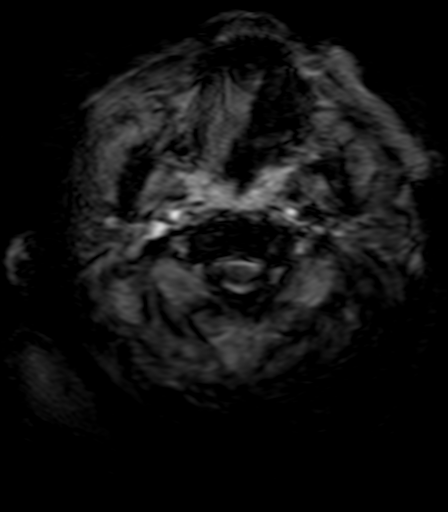
[im 15/29]
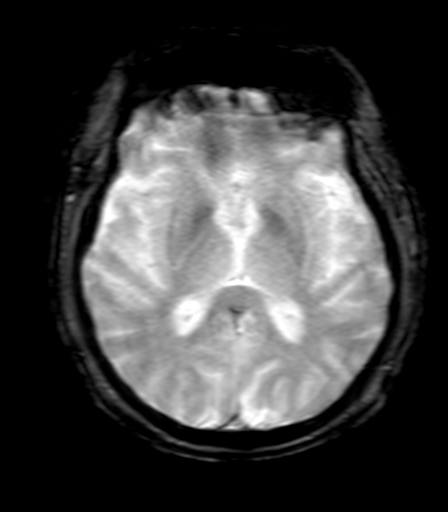
[im 29/29]
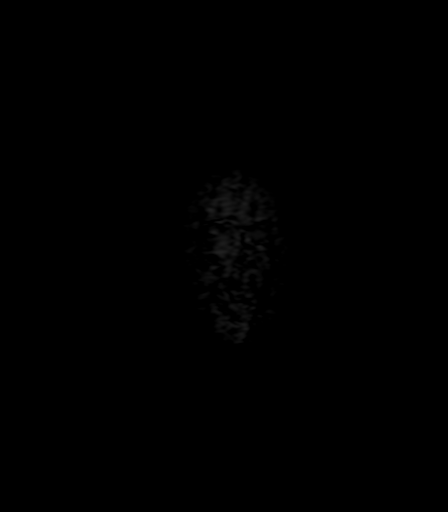

[Series 9: FLAIR · axial · 5.0mm · 1.20mm/px · z∈[-95,+60]mm · 3 of 29 slices shown]
[im 1/29]
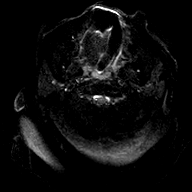
[im 15/29]
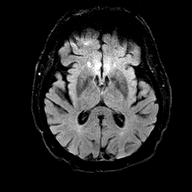
[im 29/29]
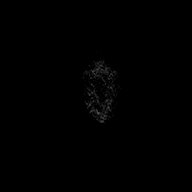

[Series 10: T1 · axial · 5.0mm · 0.90mm/px · z∈[-95,+60]mm · 3 of 29 slices shown (2 of 2)]
[im 1/29]
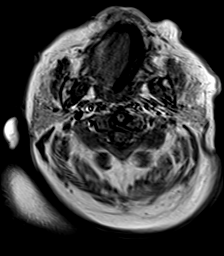
[im 15/29]
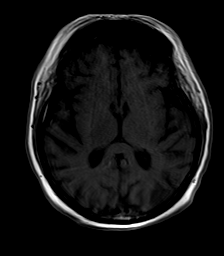
[im 29/29]
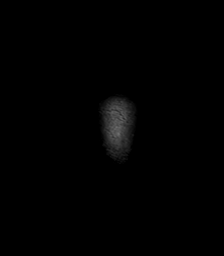

[Series 11: T2 · coronal · 5.0mm · 0.45mm/px · 4 of 33 slices shown (2 of 2)]
[im 1/33]
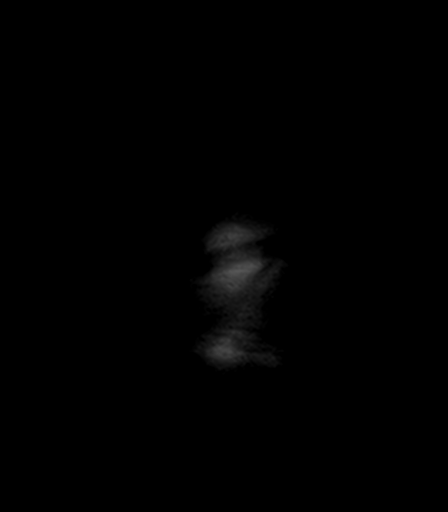
[im 11/33]
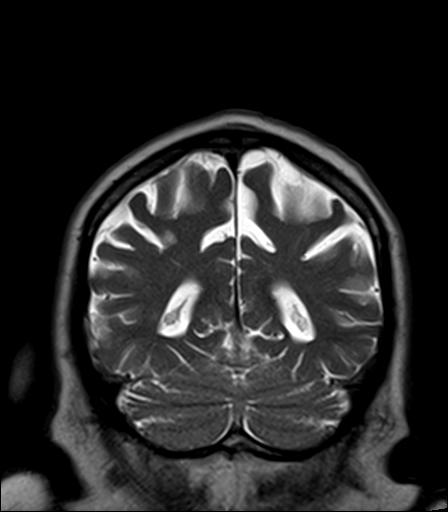
[im 22/33]
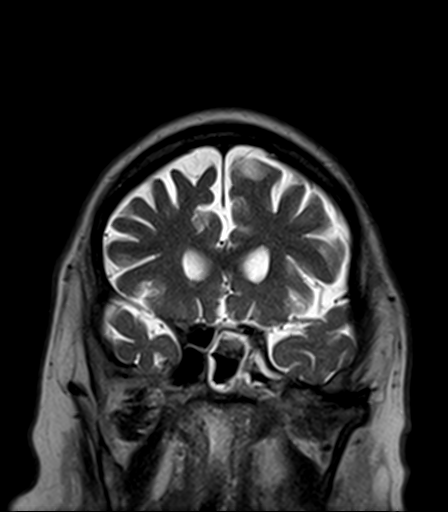
[im 33/33]
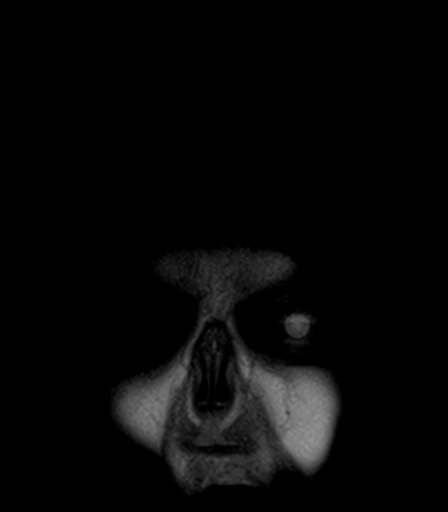

[Series 12: T1 post-contrast · axial · 5.0mm · 0.90mm/px · z∈[-95,+60]mm · 3 of 29 slices shown (1 of 2)]
[im 1/29]
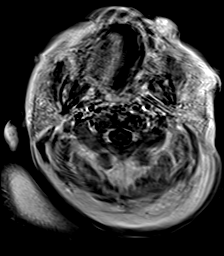
[im 15/29]
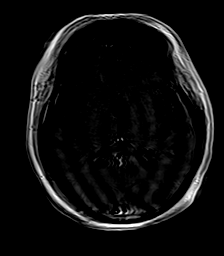
[im 29/29]
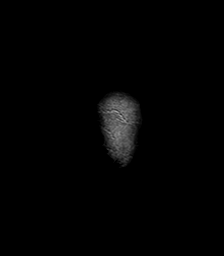

[Series 13: T1 post-contrast · coronal · 5.0mm · 0.90mm/px · 4 of 33 slices shown (2 of 2)]
[im 1/33]
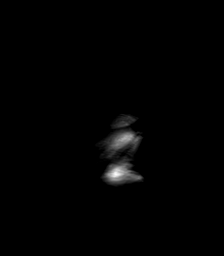
[im 11/33]
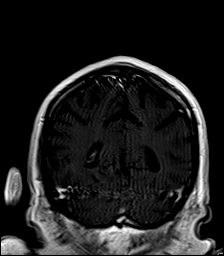
[im 22/33]
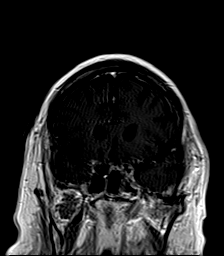
[im 33/33]
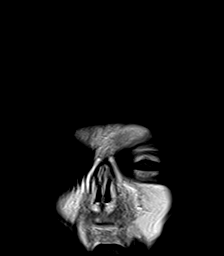

[48 of 48 positions shown; findings below may reference images not displayed]

FINDINGS: Motion artifact is present.

Brain: There is no acute infarction or intracranial hemorrhage.
There is a 1.2 x 0.5 cm enhancing lesion along the left aspect of
the falx with mild susceptibility is compatible with a small
meningioma. There is no mass effect, hydrocephalus, or extra-axial
collection. Minimal patchy T2 hyperintensity in the supratentorial
white is nonspecific but may reflect minor chronic microvascular
ischemic changes. There is left temporal encephalomalacia/gliosis.

Vascular: Major vessel flow voids at the skull base are preserved.

Skull and upper cervical spine: Normal marrow signal.

Sinuses/Orbits: Paranasal sinus mucosal thickening, with greatest
involvement of the maxillary and left sphenoid sinuses.

Other: Patchy mastoid fluid opacification.  Sella is unremarkable.
IMPRESSION: No evidence of recent infarction or intracranial hemorrhage.

Incidental small falcine meningioma.

Left temporal encephalomalacia.

## 2019-08-07 IMAGING — DX DG CHEST 1V PORT
2 series · 2 of 2 positions shown · non-contrast
Comparison: [DATE]

CLINICAL DATA: Respiratory distress

EXAM:
PORTABLE CHEST 1 VIEW

[chest ap (1 of 2)]
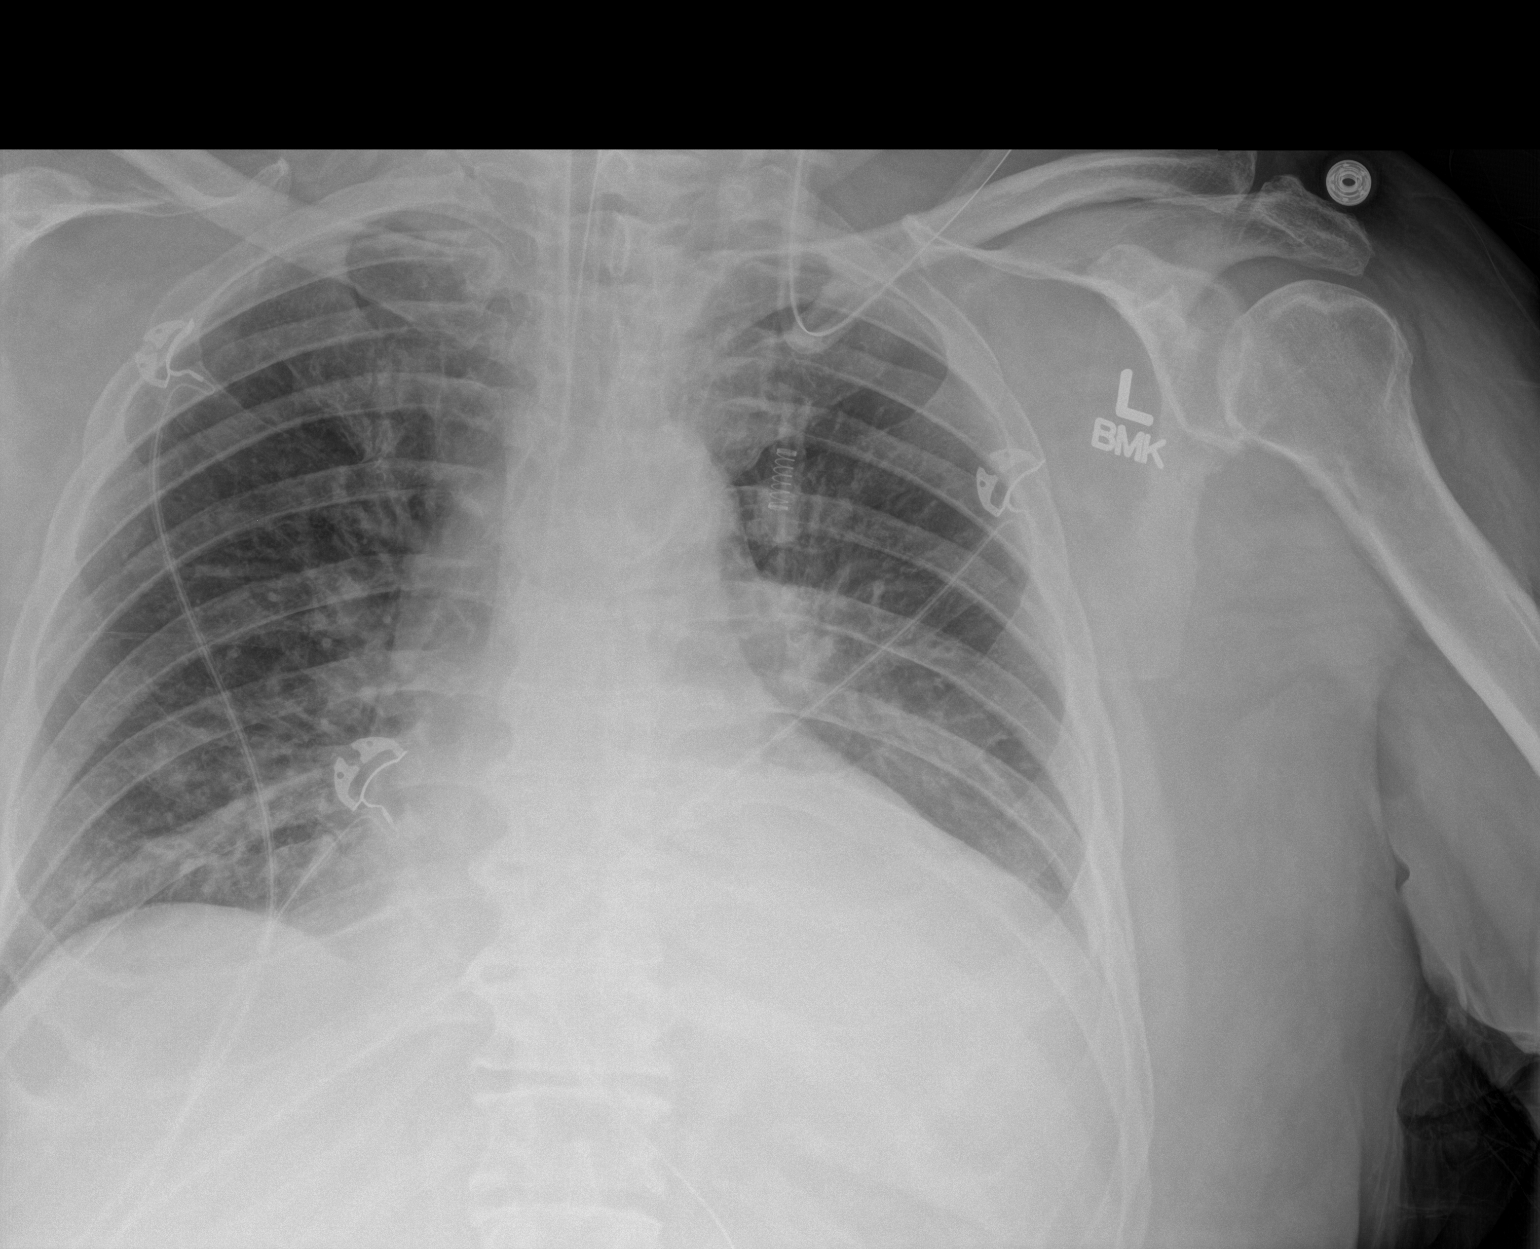

[chest ap (2 of 2)]
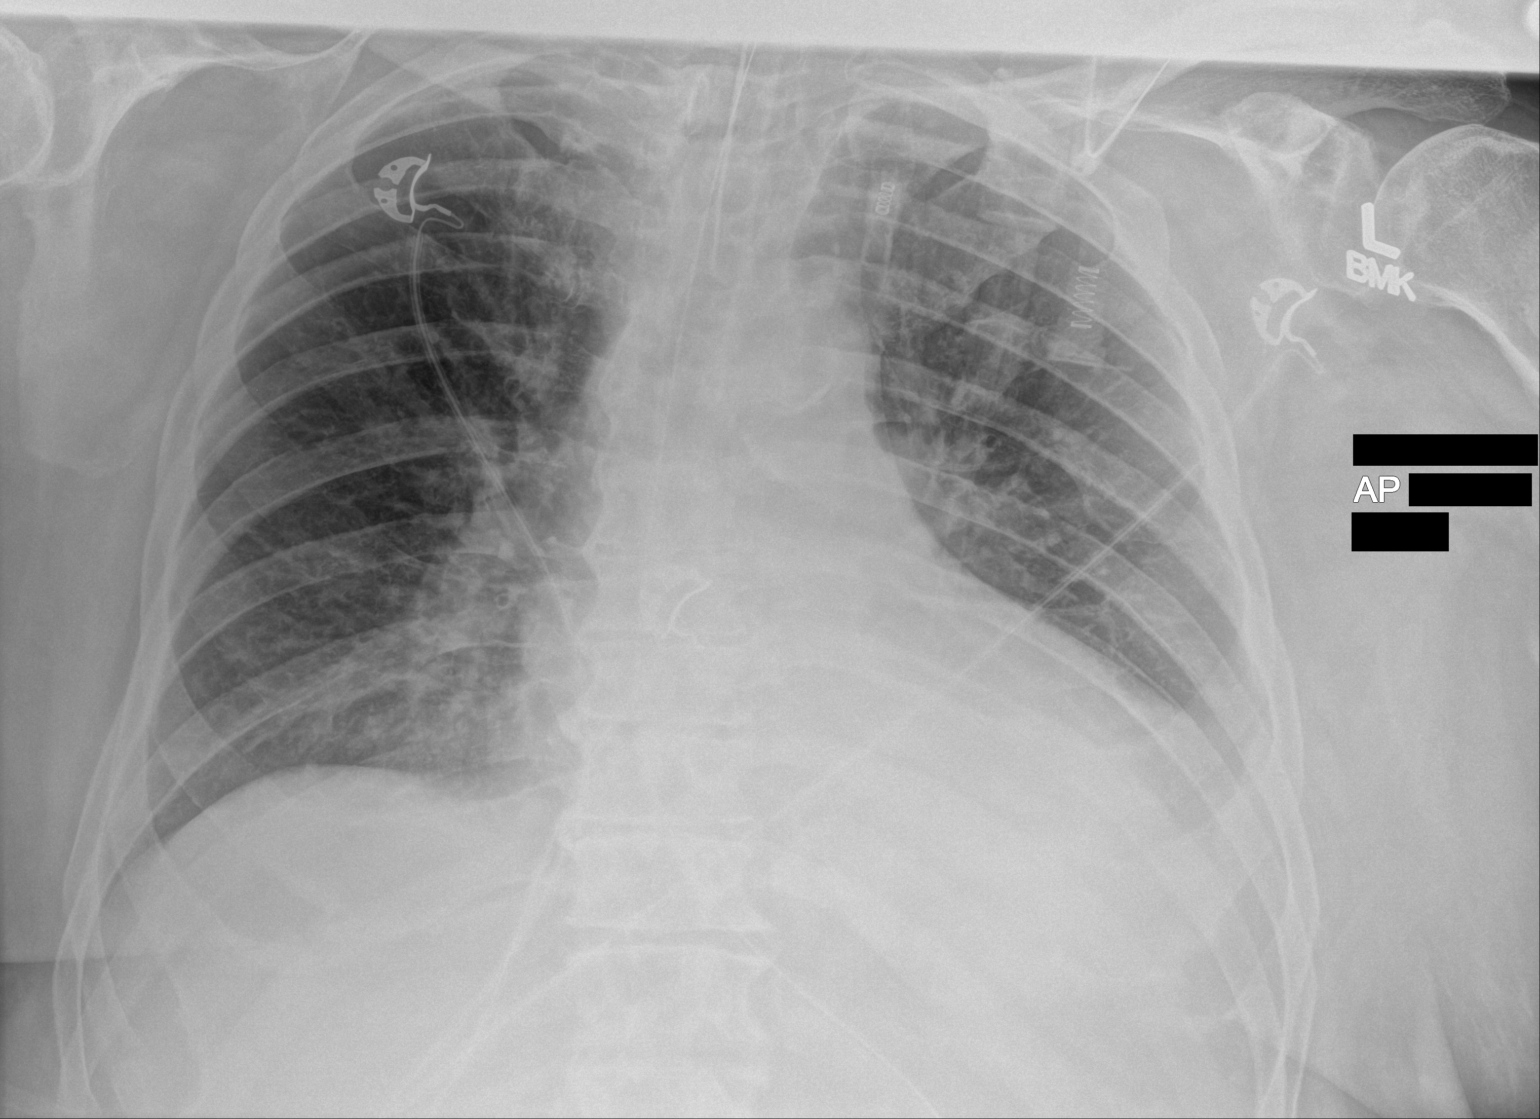

[2 of 2 positions shown; findings below may reference images not displayed]

FINDINGS: Endotracheal tube tip is in unchanged position at the level of the
clavicular heads. Enteric tube courses below the field of view.
Small left pleural effusion is unchanged. There is left basilar
atelectasis. Cardiomediastinal contours are normal.
IMPRESSION: Unchanged small left pleural effusion and left basilar atelectasis.

## 2019-08-07 MED ORDER — GADOBUTROL 1 MMOL/ML IV SOLN
10.0000 mL | Freq: Once | INTRAVENOUS | Status: AC | PRN
  Administered 2019-08-07: 10 mL via INTRAVENOUS

## 2019-08-07 MED ORDER — FREE WATER
250.0000 mL | Status: DC
  Administered 2019-08-07 – 2019-08-08 (×7): 250 mL

## 2019-08-07 MED ORDER — IPRATROPIUM-ALBUTEROL 0.5-2.5 (3) MG/3ML IN SOLN
3.0000 mL | Freq: Four times a day (QID) | RESPIRATORY_TRACT | Status: DC
  Administered 2019-08-07 – 2019-08-16 (×36): 3 mL via RESPIRATORY_TRACT
  Filled 2019-08-07 (×37): qty 3

## 2019-08-07 MED ORDER — POTASSIUM CHLORIDE 20 MEQ PO PACK
20.0000 meq | PACK | Freq: Once | ORAL | Status: AC
  Administered 2019-08-07: 20 meq via ORAL
  Filled 2019-08-07: qty 1

## 2019-08-07 MED ORDER — VITAL HIGH PROTEIN PO LIQD
1000.0000 mL | ORAL | Status: DC
  Administered 2019-08-07 – 2019-08-10 (×4): 1000 mL

## 2019-08-07 MED ORDER — CHLORHEXIDINE GLUCONATE 0.12 % MT SOLN
OROMUCOSAL | Status: AC
  Filled 2019-08-07: qty 15

## 2019-08-07 MED ORDER — POTASSIUM CHLORIDE 20 MEQ/15ML (10%) PO SOLN
20.0000 meq | ORAL | Status: AC
  Administered 2019-08-07 (×2): 20 meq
  Filled 2019-08-07 (×2): qty 15

## 2019-08-07 MED ORDER — PRO-STAT SUGAR FREE PO LIQD
60.0000 mL | Freq: Two times a day (BID) | ORAL | Status: DC
  Administered 2019-08-08 – 2019-08-11 (×7): 60 mL

## 2019-08-07 NOTE — Consult Note (Signed)
PHARMACY CONSULT NOTE - FOLLOW UP  Pharmacy Consult for Electrolyte Monitoring and Replacement   Antonio [name] is a morbidly obese 74 y.o. male admitted on 07/27/2019. EMS stated pt walked to trucked, collapsed on stretcher, and became unresponsive. Pt was apneic but still with pulse. Pt has AKI, severe respiratory distress, and acute respiratory failure. Pt is intubated and sedated, on mechanical ventilation. Per am rounds, furosemide was started yesterday but will be held today for MRI. Plans discussed in am rounds for possible extubation. Pharmacy consulted to assist with monitoring and replacing electrolytes.   Recent Labs: Potassium (mmol/L)  Date Value  08/07/2019 3.6   Magnesium (mg/dL)  Date Value  08/07/2019 2.1   Calcium (mg/dL)  Date Value  08/07/2019 8.8 (L)   Albumin (g/dL)  Date Value  07/27/2019 3.9   Phosphorus (mg/dL)  Date Value  08/06/2019 3.3   Sodium (mmol/L)  Date Value  08/07/2019 152 (H)     Assessment: 1. Electrolytes: Electrolytes are WNL, except for calcium (low) and sodium (high). Pt was started on 250 mL free water flush Q4H today to help reduce serum sodium levels. Furosemide 8mg /hr IV was started yesterday and is being held today for MRI. Two doses of potassium 20 mEq Q4H were given today. Potassium 20 mEq x 1 dose will be ordered for this evening to prevent further decline in potassium. Continue to monitor electrolytes in am labs. Will replace for potassium goal ~4 and magnesium ~2.    2. Glucose: Today's range: 100-128. Glucose levels are lower than yesterday. Glucose-capillary monitored Q4H. Pt is on insulin aspart 0-15 Porter Q4H. Continue insulin aspart and monitor glucose levels daily.   3. Constipation: Last bowel movement on 01/18. Continue to monitor and will add PRN constipation-relief meds if constipation recurs.     Roanna Banning , Pharmacy Student Clinical Pharmacist 08/07/2019 11:37 AM

## 2019-08-07 NOTE — Progress Notes (Signed)
CRITICAL CARE PROGRESS NOTE    Name: Antonio [name] MRN: [id number] DOB: [birth year]     LOS: 18   SUBJECTIVE FINDINGS & SIGNIFICANT EVENTS   Patient description: Antonio [name] is a 74 yo male with a history of alcohol abuse, tobacco abuse and suspected COPD who presented to the ED on 07/27/2019 as a Antonio [name] after being found down by EMS.  He was admitted to the ICU in acute respiratory distress, intubated and sedated.  On further workup, he was found to have a grade I diastolic dysfunction, moderate aortic stenosis and AKI.     Lines / Drains: Midline single lumen left cephalic PIV right forearm NG/OG tube Urethral catheter Rectal tube ET tube x 9 days  Cultures / Sepsis markers: Respiratory culture 1/11 consistent with normal respiratory flora.  Negative for MRSA, Influenza A/B, Sars-cov-2.  Blood cultures 1/8 showed no growth at 5 days.    Patient remains afebrile at 98.38F.     Protocols / Consultants: CIWA protocol  Tests / Events: 1/9 severe resp failure acute sCHF, intubated 1/8 1/10 SAT/SBT pending, +UTI continue ABX 1/11 patient failed wean due to agitation and increased work of breathing 1/12 patient failed weaning trial 1/13 bradycardia at 48 bpm. No elevation in troponin. Family discussion-patient is DNR  1/14 failed SAT/SBT due to severe delirium and WOB 1/16 Slow to awaken but no asynchrony with sedation vacation 1/17 developed A. fib flutter with controlled ventricular response 1/18 patient remains in anasarca - now on lasix gtt 1/19 MRI brain with no acute CVA but + right temporal encephalomalacia and a meningioma found incidentally  CC  Follow up severe acute respiratory failure  HPI Suspected history of COPD Grade I diastolic dysfunction Moderate aortic stenosis Sedated and  intubated on vent Acute kidney injury  DTs DNR status A fib/flutter on    PAST MEDICAL HISTORY   Past Medical History:  Diagnosis Date  . ETOH abuse   . Smoker      SURGICAL HISTORY   History reviewed. No pertinent surgical history.   FAMILY HISTORY   No family history on file.   SOCIAL HISTORY   Social History   Tobacco Use  . Smoking status: Not on file  . Smokeless tobacco: Never Used  Substance Use Topics  . Alcohol use: Never  . Drug use: Never     MEDICATIONS   Current Medication:  Current Facility-Administered Medications:  .  0.9 %  sodium chloride infusion, 250 mL, Intravenous, PRN, Flora Lipps, MD, Last Rate: 5 mL/hr at 08/06/19 0904, 250 mL at 08/06/19 0904 .  acetaminophen (TYLENOL) tablet 650 mg, 650 mg, Oral, Q4H PRN, Mortimer Fries, Kurian, MD .  amLODipine (NORVASC) tablet 10 mg, 10 mg, Per Tube, Daily, Awilda Bill, NP, 10 mg at 08/06/19 1054 .  budesonide (PULMICORT) nebulizer solution 0.5 mg, 0.5 mg, Nebulization, BID, Kasa, Kurian, MD, 0.5 mg at 08/07/19 0740 .  chlorhexidine gluconate (MEDLINE KIT) (PERIDEX) 0.12 % solution 15 mL, 15 mL, Mouth Rinse, BID, Kasa, Kurian, MD, 15 mL at 08/06/19 1956 .  Chlorhexidine Gluconate Cloth 2 % PADS 6 each, 6 each, Topical, Daily, Flora Lipps, MD, 6 each at 08/06/19 2209 .  enoxaparin (LOVENOX) injection 40 mg, 40 mg, Subcutaneous, Q24H, Hallaji, Sheema M, RPH, 40 mg at 08/06/19 1058 .  famotidine (PEPCID) tablet 20 mg, 20 mg, Per Tube, BID, Flora Lipps, MD, 20 mg at 08/06/19 2207 .  feeding supplement (PRO-STAT SUGAR FREE 64) liquid 60 mL, 60 mL, Per  Tube, TID, Flora Lipps, MD, 60 mL at 08/06/19 2208 .  feeding supplement (VITAL HIGH PROTEIN) liquid 1,000 mL, 1,000 mL, Per Tube, Q24H, Kasa, Kurian, MD, 1,000 mL at 08/06/19 1804 .  fentaNYL (SUBLIMAZE) injection 50 mcg, 50 mcg, Intravenous, Q15 min PRN, Flora Lipps, MD, 50 mcg at 07/29/19 1652 .  fentaNYL (SUBLIMAZE) injection 50-200 mcg, 50-200 mcg,  Intravenous, Q30 min PRN, Flora Lipps, MD, 100 mcg at 08/06/19 0208 .  fentaNYL 2524mg in NS 2573m(1045mml) infusion-PREMIX, 0-400 mcg/hr, Intravenous, Continuous, Kasa, Kurian, MD, Last Rate: 10 mL/hr at 08/05/19 0654, 100 mcg/hr at 08/05/19 0654 .  folic acid (FOLVITE) tablet 1 mg, 1 mg, Per Tube, Daily, Kasa, Kurian, MD, 1 mg at 08/06/19 1052 .  free water 250 mL, 250 mL, Per Tube, Q4H, Blakeney, DanDreama SaaP .  furosemide (LASIX) 250 mg in dextrose 5 % 250 mL (1 mg/mL) infusion, 8 mg/hr, Intravenous, Continuous, Angely Dietz, MD, Last Rate: 8 mL/hr at 08/06/19 1442, 8 mg/hr at 08/06/19 1442 .  hydrALAZINE (APRESOLINE) injection 20 mg, 20 mg, Intravenous, Q4H PRN, GonTyler PitaD, 20 mg at 08/06/19 0105 .  insulin aspart (novoLOG) injection 0-15 Units, 0-15 Units, Subcutaneous, Q4H, SimCharlett NosePH, 2 Units at 08/07/19 0013 .  ipratropium-albuterol (DUONEB) 0.5-2.5 (3) MG/3ML nebulizer solution 3 mL, 3 mL, Nebulization, Q6H, AleLanney Ginsuad, MD, 3 mL at 08/07/19 0740 .  liver oil-zinc oxide (DESITIN) 40 % ointment, , Topical, BID, AleOttie GlazierD, Given at 08/06/19 2210 .  MEDLINE mouth rinse, 15 mL, Mouth Rinse, 10 times per day, KasFlora LippsD, 15 mL at 08/07/19 0545 .  metoprolol tartrate (LOPRESSOR) injection 2.5-5 mg, 2.5-5 mg, Intravenous, Q4H PRN, GonTyler PitaD, 5 mg at 08/06/19 1832 .  midazolam (VERSED) injection 2 mg, 2 mg, Intravenous, Q15 min PRN, KasFlora LippsD, 2 mg at 08/05/19 2147 .  midazolam (VERSED) injection 2 mg, 2 mg, Intravenous, Q2H PRN, KasFlora LippsD .  multivitamin liquid 15 mL, 15 mL, Oral, Daily, Kasa, Kurian, MD, 15 mL at 08/06/19 1055 .  nitroGLYCERIN (NITROGLYN) 2 % ointment 0.5 inch, 0.5 inch, Topical, Q6H, Blakeney, DanDreama SaaP, 0.5 inch at 08/07/19 0545 .  ondansetron (ZOFRAN) injection 4 mg, 4 mg, Intravenous, Q6H PRN, KasMortimer Friesurian, MD .  potassium chloride 20 MEQ/15ML (10%) solution 20 mEq, 20 mEq, Per Tube, Q4H, BlaAwilda BillP, 20 mEq at 08/07/19 0618 .  sodium chloride flush (NS) 0.9 % injection 10-40 mL, 10-40 mL, Intracatheter, PRN, KasFlora LippsD, 10 mL at 08/03/19 0929 .  [COMPLETED] thiamine '500mg'$  in normal saline (21m37mVPB, 500 mg, Intravenous, Daily, Last Rate: 100 mL/hr at 08/05/19 1033, 500 mg at 08/05/19 1033 **FOLLOWED BY** thiamine tablet 100 mg, 100 mg, Per Tube, Daily, Kasa, Kurian, MD, 100 mg at 08/06/19 1055    ALLERGIES   Patient has no allergy information on record.    REVIEW OF SYSTEMS    Unable to obtain due to severity of patient condition.  PHYSICAL EXAMINATION   Vital Signs: Temp:  [99.6 F (37.6 C)-99.9 F (37.7 C)] 99.8 F (37.7 C) (01/19 0400) Pulse Rate:  [59-114] 95 (01/19 0600) Resp:  [20-28] 21 (01/19 0600) BP: (150-175)/(34-64) 168/53 (01/19 0600) SpO2:  [99 %-100 %] 100 % (01/19 0740) FiO2 (%):  [30 %] 30 % (01/19 0740) Weight:  [107.2 kg] 107.2 kg (01/19 0500)  GENERAL:Patient is an ill-appearing obese man.  Intubated and sedated HEAD: Normocephalic, atraumatic.  EYES: Pupils equal,  round, slowly reactive to light.  No scleral icterus.  MOUTH: Moist mucosal membrane. NECK: Supple. No thyromegaly. No nodules. No JVD.  PULMONARY: Wheezing and rhonchi present bilaterally diminished in bilateral lower lobes CARDIOVASCULAR: S1 and S2. Regular rate and rhythm. Distant heart sounds.  Systolic murmur at right upper sternal border consistent with aortic stenosis. GASTROINTESTINAL: Soft, nontender, non-distended. No masses. Positive bowel sounds. No hepatosplenomegaly.  MUSCULOSKELETAL: Edema in bilateral fingers.   NEUROLOGIC: Obtunded.  Pupils slowly reactive. Corneal reflex sluggish.  Cough reflex intact.  Patient does not follow commands.  Makes non-purposeful movements of extremities.   SKIN:intact,warm,dry   PERTINENT DATA     Infusions: . sodium chloride 250 mL (08/06/19 0904)  . fentaNYL infusion INTRAVENOUS 100 mcg/hr (08/05/19 0654)  .  furosemide (LASIX) infusion 8 mg/hr (08/06/19 1442)   Scheduled Medications: . amLODipine  10 mg Per Tube Daily  . budesonide (PULMICORT) nebulizer solution  0.5 mg Nebulization BID  . chlorhexidine gluconate (MEDLINE KIT)  15 mL Mouth Rinse BID  . Chlorhexidine Gluconate Cloth  6 each Topical Daily  . enoxaparin (LOVENOX) injection  40 mg Subcutaneous Q24H  . famotidine  20 mg Per Tube BID  . feeding supplement (PRO-STAT SUGAR FREE 64)  60 mL Per Tube TID  . feeding supplement (VITAL HIGH PROTEIN)  1,000 mL Per Tube Q24H  . folic acid  1 mg Per Tube Daily  . free water  250 mL Per Tube Q4H  . insulin aspart  0-15 Units Subcutaneous Q4H  . ipratropium-albuterol  3 mL Nebulization Q6H  . liver oil-zinc oxide   Topical BID  . mouth rinse  15 mL Mouth Rinse 10 times per day  . multivitamin  15 mL Oral Daily  . nitroGLYCERIN  0.5 inch Topical Q6H  . potassium chloride  20 mEq Per Tube Q4H  . thiamine  100 mg Per Tube Daily   PRN Medications: sodium chloride, acetaminophen, fentaNYL (SUBLIMAZE) injection, fentaNYL (SUBLIMAZE) injection, hydrALAZINE, metoprolol tartrate, midazolam, midazolam, ondansetron (ZOFRAN) IV, sodium chloride flush Hemodynamic parameters:   Intake/Output: 01/18 0701 - 01/19 0700 In: 1393.1 [I.V.:71.8; NG/GT:1321.3] Out: 6378 [Urine:3325; Stool:200]  Ventilator  Settings: Vent Mode: PRVC FiO2 (%):  [30 %] 30 % Set Rate:  [20 bmp] 20 bmp Vt Set:  [500 mL] 500 mL PEEP:  [5 cmH20] 5 cmH20   LAB RESULTS:  Basic Metabolic Panel: Recent Labs  Lab 08/01/19 0717 08/02/19 0410 08/03/19 0340 08/03/19 0340 08/04/19 0345 08/04/19 0345 08/05/19 5885 08/05/19 0632 08/06/19 0732 08/07/19 0359  NA 141   < > 143  --  146*  --  147*  --  149* 152*  K 4.3   < > 4.0   < > 3.5   < > 4.7   < > 4.4 3.6  CL 107   < > 110  --  114*  --  113*  --  115* 114*  CO2 27   < > 24  --  25  --  26  --  23 29  GLUCOSE 134*   < > 136*  --  118*  --  114*  --  147* 111*  BUN 41*    < > 58*  --  51*  --  56*  --  58* 54*  CREATININE 1.30*   < > 1.04  --  1.08  --  1.08  --  1.10 1.01  CALCIUM 8.5*   < > 8.3*  --  8.2*  --  8.9  --  9.0 8.8*  MG 2.5*  --   --   --  2.6*  --   --   --  2.6* 2.1  PHOS  --   --   --   --  2.3*  --   --   --  3.3  --    < > = values in this interval not displayed.   Liver Function Tests: No results for input(s): AST, ALT, ALKPHOS, BILITOT, PROT, ALBUMIN in the last 168 hours. No results for input(s): LIPASE, AMYLASE in the last 168 hours. No results for input(s): AMMONIA in the last 168 hours. CBC: Recent Labs  Lab 08/02/19 0410 08/04/19 0345 08/05/19 0632 08/06/19 0732 08/07/19 0359  WBC 9.5 10.5 10.6* 12.3* 11.5*  NEUTROABS  --  6.8  --  9.8* 8.6*  HGB 11.8* 13.1 13.6 14.0 14.1  HCT 36.3* 42.5 44.3 43.9 43.0  MCV 96.5 101.9* 101.6* 98.9 97.7  PLT 223 249 282 257 282   Cardiac Enzymes: No results for input(s): CKTOTAL, CKMB, CKMBINDEX, TROPONINI in the last 168 hours. BNP: Invalid input(s): POCBNP CBG: Recent Labs  Lab 08/06/19 1606 08/06/19 1938 08/07/19 0000 08/07/19 0409 08/07/19 0735  GLUCAP 124* 123* 128* 111* 100*     IMAGING RESULTS:  Imaging: DG Chest Port 1 View  Result Date: 08/07/2019 CLINICAL DATA:  Respiratory distress EXAM: PORTABLE CHEST 1 VIEW COMPARISON:  08/05/2019 FINDINGS: Endotracheal tube tip is in unchanged position at the level of the clavicular heads. Enteric tube courses below the field of view. Small left pleural effusion is unchanged. There is left basilar atelectasis. Cardiomediastinal contours are normal. IMPRESSION: Unchanged small left pleural effusion and left basilar atelectasis. Electronically Signed   By: Ulyses Jarred M.D.   On: 08/07/2019 03:12        ASSESSMENT AND PLAN    -Multidisciplinary rounds held today  Acute Hypoxic Respiratory Failure due to pulmonary edema, bilateral pleural effusions, Grade I diastolic heart failure and suspected history of COPD -  Currently maintaining SpO2 96% on 30% FiO2, PEEP 5.  CT 1/17 demonstrated increasing bilateral pleural effusions and bibasilar airspace disease and increasing pulmonary edema.   -continue Full MV support -continue Bronchodilator Therapy with budesonide and duoneb -Wean Fio2 and PEEP as tolerated -will perform SAT/SBT when respiratory parameters are met - Begin furosemide drip 250 mg in D5W infusion-decrease rate from 8 to '4mg'$ /kg/l   CARDIAC FAILURE with grade I diastolic dysfunction -oxygen as needed -Lasix as tolerated -follow up cardiac enzymes as indicated - Continue topical nitroglycerin ICU monitoring  Hypertension - BP continues to stay in the 175/37 mmHg range. - amlodipine 10 mg daily - Metoprolol  Q4H prn for hypertension - Clonodine 0.1 mg BID  Delirium Tremens - History of alcohol abuse - Continue thiamine 315 mg daily, folic acid 1 mg daily - Modified CIWA protocol with hydralazine  Acute kidney injury- Most likely due to ATN - BUN 58, Creatinine 1.10, GFR >60, patient continues to have appropriate urine output in foley - Will continue to monitor  Hypernatremia and hyperchloridemia - Sodium increased to 149 today from 147 yesterday.  Chloride increased from 113 yesterday to 115 today. - Continue free water 200 mL Q4H  NEUROLOGY - intubated and sedated with fentanyl 100 mcg - minimal sedation to achieve a RASS goal: -1 Wake up assessment pending - Patient remains obtunded after discontinuing sedation with sluggish pupils.  Is not following commands.  - Due to mental status, hypertension, and widened pulse pressure, consider MRI  head.  GI/Nutrition GI PROPHYLAXIS as indicated DIET-->Continue TF's as tolerated Patient continues to have loose bowel movements- rectal tube in place.  Hold stool softeners.    ENDO - ICU hypoglycemic\Hyperglycemia protocol -check FSBS per protocol - Continue insulin sliding scale  Intertrigo in groin - Begin application of  hydrocerin cream BID. - Will continue nursing wound care and bedding changes to reduce the risk of further breakdown.   Goals of care - Patient DNR status  - Patient has been intubated for 9 days.  Plan family meeting in the next couple of days to discuss patient prognosis and goals of care including PEG and trach placement.  ELECTROLYTES -follow labs as needed -replace as needed  -pharmacy consultation   DVT/GI PRX ordered - Lovenox 40 mg QD for DVT prophylaxis TRANSFUSIONS AS NEEDED MONITOR FSBS ASSESS the need for LABS as needed   Critical care provider statement:    Critical care time (minutes):  34   Critical care time was exclusive of:  Separately billable procedures and treating other patients   Critical care was necessary to treat or prevent imminent or life-threatening deterioration of the following conditions:  Acute respiratory failure, Delerium tremens   Critical care was time spent personally by me on the following activities:  Development of treatment plan with patient or surrogate, discussions with consultants, evaluation of patient's response to treatment, examination of patient, obtaining history from patient or surrogate, ordering and performing treatments and interventions, ordering and review of laboratory studies and re-evaluation of patient's condition.  I assumed direction of critical care for this patient from another provider in my specialty: no    Patient remains critically ill with multi-organ failure.  At risk of developing cardiac arrest and death. Patient currently DNR status.    Ottie Glazier, M.D.  Division of [facility]

## 2019-08-07 NOTE — Progress Notes (Signed)
Pt was transported to MRI and back to CCU while on the vent.

## 2019-08-07 NOTE — Progress Notes (Signed)
Nutrition Follow-up  DOCUMENTATION CODES:   Obesity unspecified  INTERVENTION:  Initiate new goal TF regimen of Vital High Protein at 40 mL/hr (960 mL goal daily volume) + Pro-Stat 60 mL BID per tube. Provides 1360 kcal, 144 grams of protein, 806 mL H2O daily.  Continue liquid MVI daily.  With current free water flush of 250 mL Q4hrs this will provide a total of 2306 mL H2O daily.  NUTRITION DIAGNOSIS:   Inadequate oral intake related to inability to eat as evidenced by NPO status.  Ongoing - addressing with TF regimen.  GOAL:   Provide needs based on ASPEN/SCCM guidelines  Met with TF regimen.  MONITOR:   Vent status, Labs, Weight trends, TF tolerance, Skin, I & O's  REASON FOR ASSESSMENT:   Ventilator    ASSESSMENT:   74 year old male admitted with acute hypoxic and hypercapnic respiratory failure from acute systolic/diastolic heart failure requiring intubation on 1/8, also with sepsis and UTI.  Patient is currently intubated on ventilator support MV: 14.1 L/min Temp (24hrs), Avg:99.5 F (37.5 C), Min:98.6 F (37 C), Max:99.8 F (37.7 C)  Propofol: N/A  Medications reviewed and include: famotidine, folic acid 1 mg daily, free water flush 250 mL Q4hrs, Novolog 0-15 units Q4hrs, liquid MVI daily, potassium chloride 20 mEq once today, thiamine 100 mg daily, Lasix at 8 mL/hr.  Labs reviewed: CBG 100-118, Sodium 152, chloride 114, BUN 54.  Enteral Access: OGT  Weight trend: 107.2 kg on 1/19 (trending down with diuresis)  I/O: 3325 mL UOP yesterday (1.3 mL/kg/hr)  Discussed with RN and on rounds. Patient now off propofol gtt.  Diet Order:   Diet Order    None     EDUCATION NEEDS:   No education needs have been identified at this time  Skin:  Skin Assessment: Skin Integrity Issues:(MSAD to groin)  Last BM:  08/06/2019  Height:   Ht Readings from Last 1 Encounters:  08/06/19 5' 7.01" (1.702 m)   Weight:   Wt Readings from Last 1 Encounters:   08/07/19 107.2 kg   Ideal Body Weight:  67.3 kg  BMI:  Body mass index is 37.01 kg/m.  Estimated Nutritional Needs:   Kcal:  1265-1610 (11-14 kcal/kg)  Protein:  135 grams (2 grams/kg IBW)  Fluid:  1.7-2 L/day  Jacklynn Barnacle, MS, RD, LDN Office: [phone] Pager: [phone] After Hours/Weekend Pager: [phone]

## 2019-08-08 ENCOUNTER — Inpatient Hospital Stay: Payer: Medicare Other

## 2019-08-08 LAB — URINALYSIS, COMPLETE (UACMP) WITH MICROSCOPIC
Bacteria, UA: NONE SEEN
Bilirubin Urine: NEGATIVE
Glucose, UA: NEGATIVE mg/dL
Hgb urine dipstick: NEGATIVE
Ketones, ur: NEGATIVE mg/dL
Leukocytes,Ua: NEGATIVE
Nitrite: NEGATIVE
Protein, ur: NEGATIVE mg/dL
Specific Gravity, Urine: 1.02 (ref 1.005–1.030)
Squamous Epithelial / HPF: NONE SEEN (ref 0–5)
pH: 5 (ref 5.0–8.0)

## 2019-08-08 LAB — PROTIME-INR
INR: 1.4 — ABNORMAL HIGH (ref 0.8–1.2)
Prothrombin Time: 17.4 seconds — ABNORMAL HIGH (ref 11.4–15.2)

## 2019-08-08 LAB — GLUCOSE, CAPILLARY
Glucose-Capillary: 125 mg/dL — ABNORMAL HIGH (ref 70–99)
Glucose-Capillary: 118 mg/dL — ABNORMAL HIGH (ref 70–99)
Glucose-Capillary: 111 mg/dL — ABNORMAL HIGH (ref 70–99)
Glucose-Capillary: 152 mg/dL — ABNORMAL HIGH (ref 70–99)
Glucose-Capillary: 120 mg/dL — ABNORMAL HIGH (ref 70–99)

## 2019-08-08 LAB — HEPATIC FUNCTION PANEL
ALT: 92 U/L — ABNORMAL HIGH (ref 0–44)
AST: 87 U/L — ABNORMAL HIGH (ref 15–41)
Albumin: 2.9 g/dL — ABNORMAL LOW (ref 3.5–5.0)
Alkaline Phosphatase: 55 U/L (ref 38–126)
Bilirubin, Direct: 0.2 mg/dL (ref 0.0–0.2)
Indirect Bilirubin: 0.6 mg/dL (ref 0.3–0.9)
Total Bilirubin: 0.8 mg/dL (ref 0.3–1.2)
Total Protein: 6.6 g/dL (ref 6.5–8.1)

## 2019-08-08 LAB — BASIC METABOLIC PANEL
Anion gap: 11 (ref 5–15)
BUN: 57 mg/dL — ABNORMAL HIGH (ref 8–23)
CO2: 27 mmol/L (ref 22–32)
Calcium: 8.6 mg/dL — ABNORMAL LOW (ref 8.9–10.3)
Chloride: 115 mmol/L — ABNORMAL HIGH (ref 98–111)
Creatinine, Ser: 1.16 mg/dL (ref 0.61–1.24)
GFR calc Af Amer: >60 mL/min (ref >60)
GFR calc non Af Amer: >60 mL/min (ref >60)
Glucose, Bld: 140 mg/dL — ABNORMAL HIGH (ref 70–99)
Potassium: 3.6 mmol/L (ref 3.5–5.1)
Sodium: 153 mmol/L — ABNORMAL HIGH (ref 135–145)

## 2019-08-08 LAB — MRSA PCR SCREENING: MRSA by PCR: NEGATIVE

## 2019-08-08 LAB — ACTH STIMULATION, 3 TIME POINTS
Cortisol, 30 Min: 48.1 ug/dL
Cortisol, 60 Min: 44.5 ug/dL
Cortisol, Base: 19.1 ug/dL

## 2019-08-08 LAB — AMMONIA: Ammonia: 64 umol/L — ABNORMAL HIGH (ref 9–35)

## 2019-08-08 LAB — MAGNESIUM: Magnesium: 2.6 mg/dL — ABNORMAL HIGH (ref 1.7–2.4)

## 2019-08-08 LAB — PROCALCITONIN: Procalcitonin: 0.29 ng/mL

## 2019-08-08 LAB — VITAMIN B12: Vitamin B-12: 349 pg/mL (ref 180–914)

## 2019-08-08 IMAGING — DX DG CHEST 1V PORT
1 series · 2 of 2 positions shown · non-contrast
Comparison: [DATE], earlier same day

CLINICAL DATA: PICC line placement

EXAM:
PORTABLE CHEST 1 VIEW

[Series 1: chest ap · 0.14mm/px · 2 of 2 slices shown]
[im 1/2]
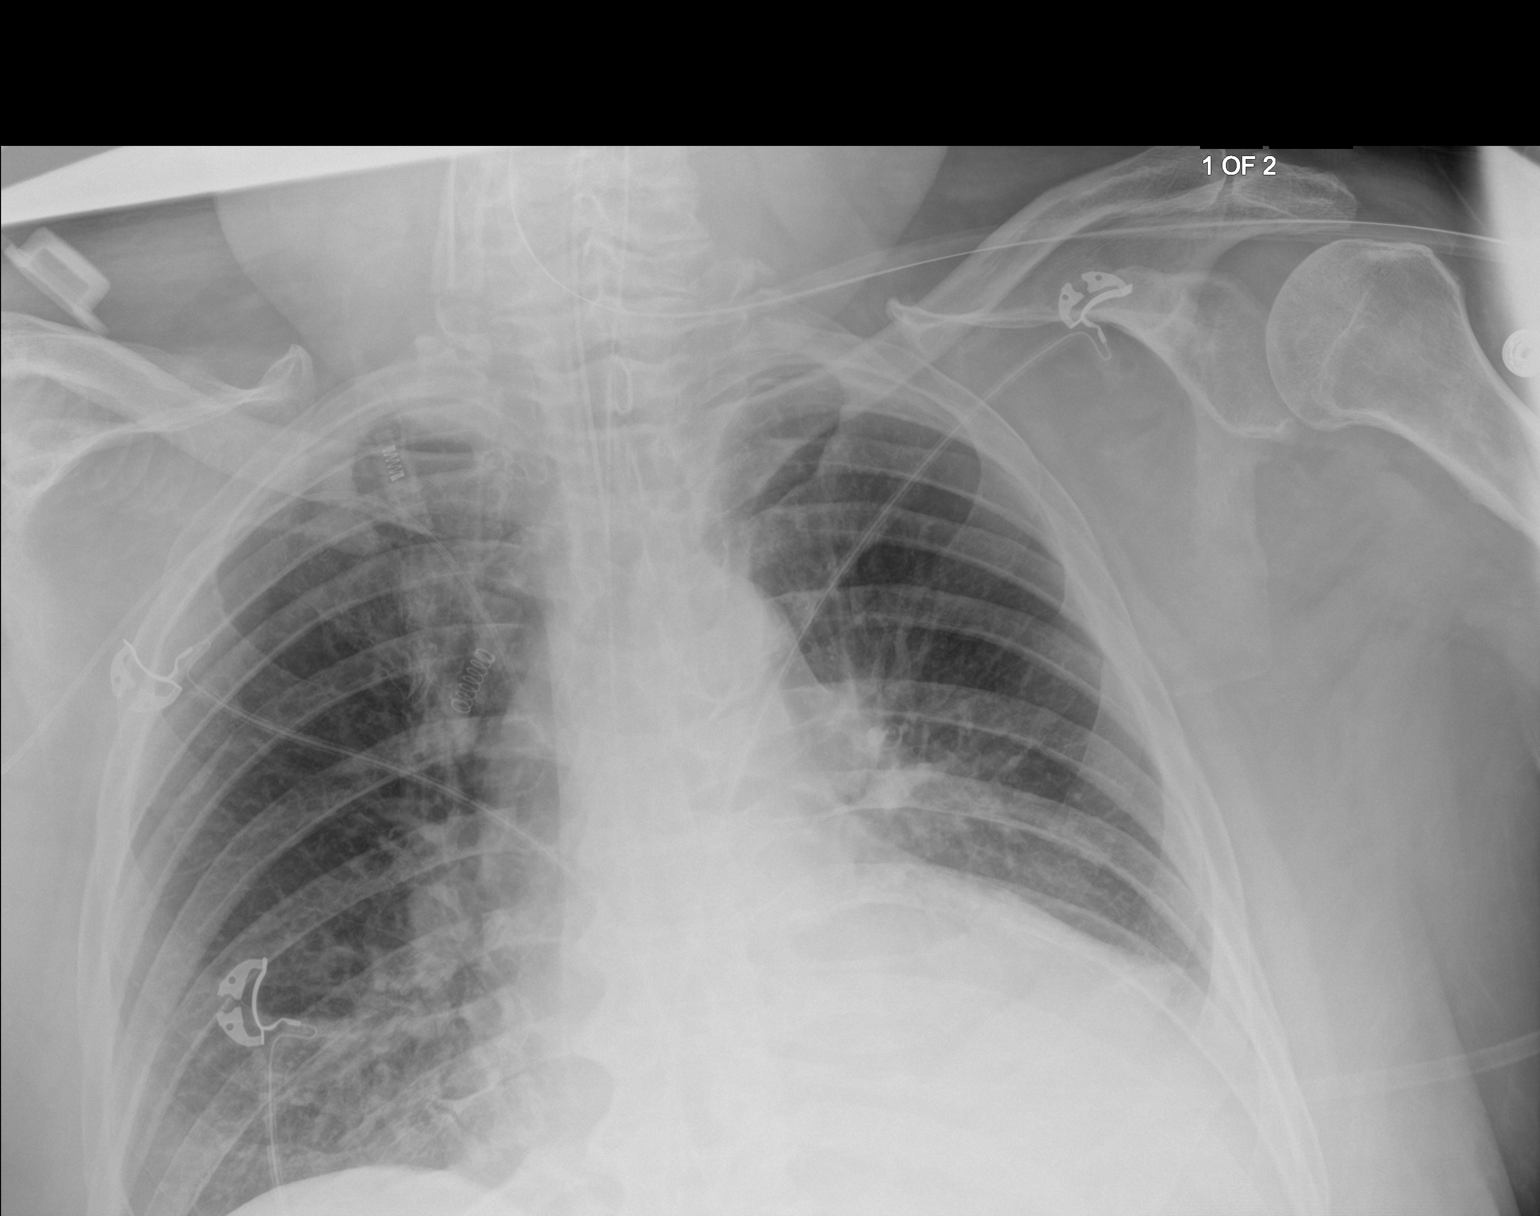
[im 2/2]
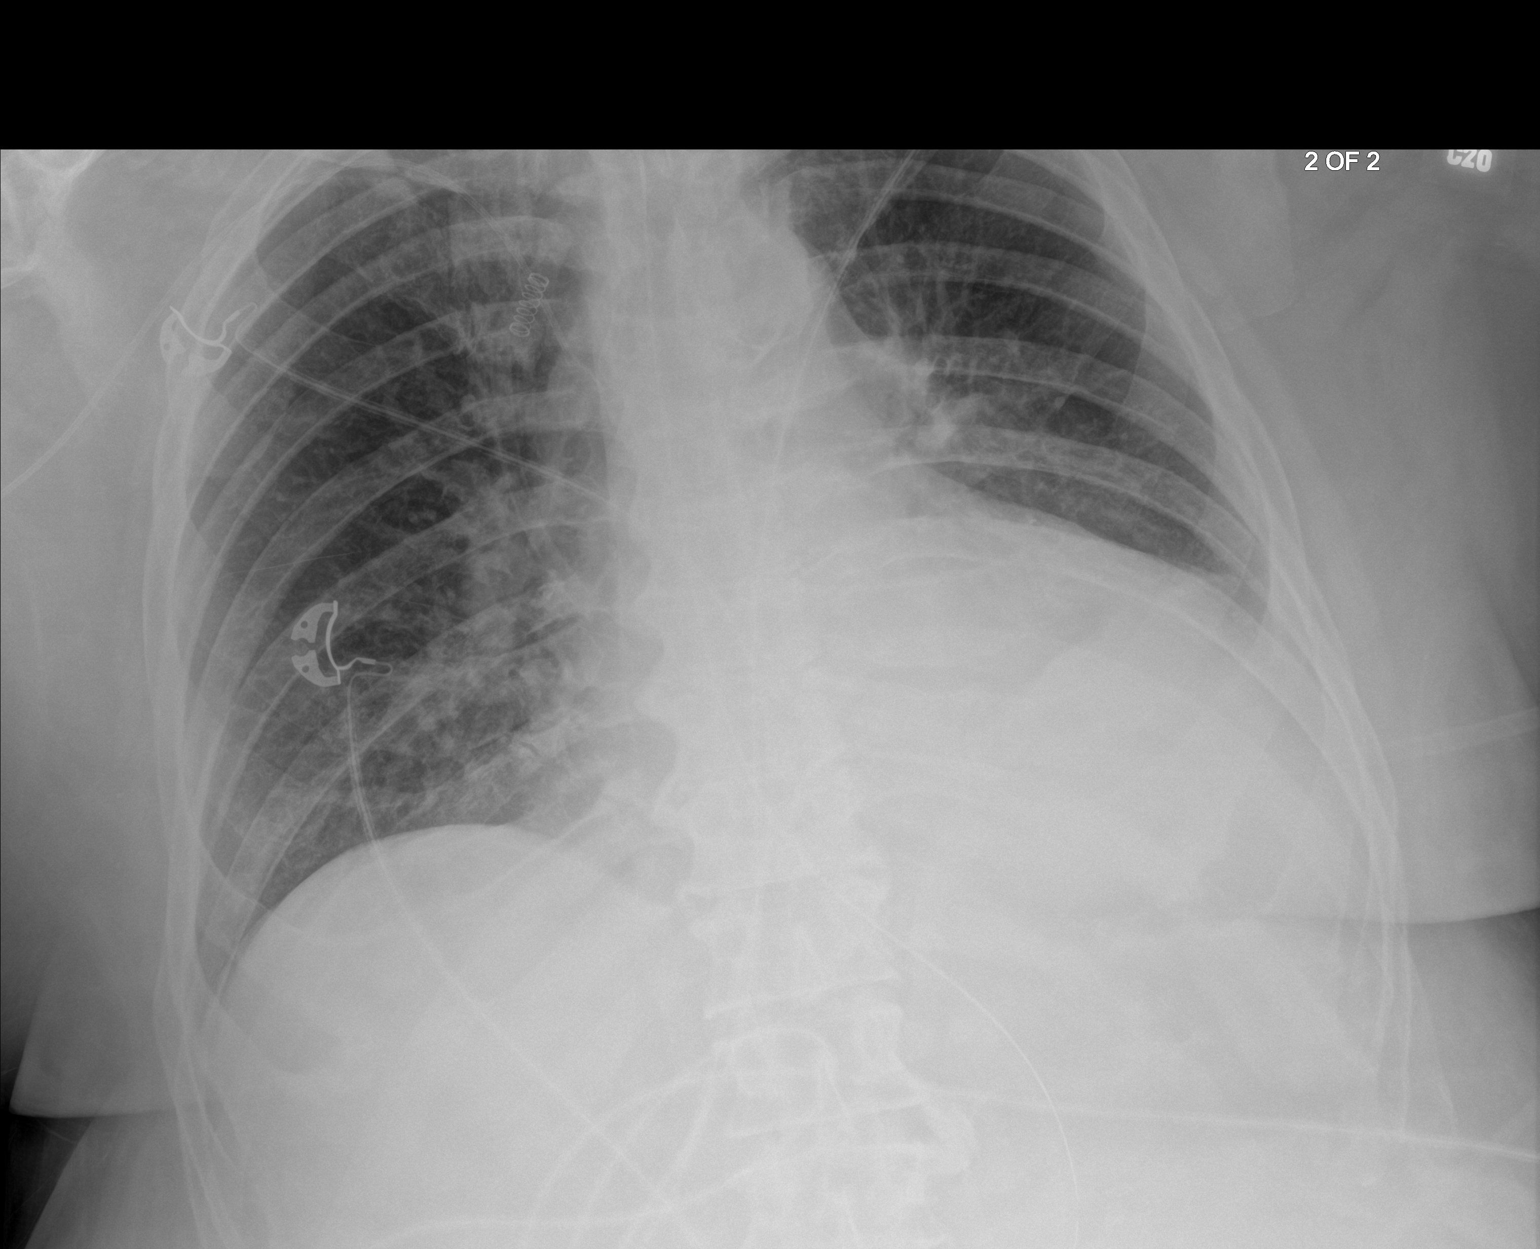

[2 of 2 positions shown; findings below may reference images not displayed]

FINDINGS: [EM] hours. Low volume film does not include the right lung base.
The cardio pericardial silhouette is enlarged. Bibasilar
atelectasis/infiltrate is similar to prior with small left pleural
effusion less well demonstrated.

New right PICC line tip projects in the region of the innominate
vein confluence. Endotracheal tube tip is approximately 5.5 cm above
the base of the carina. The NG tube passes into the stomach although
the distal tip position is not included on the film.
IMPRESSION: 1. New right PICC line tip projects in the region of the innominate
vein confluence.
2. Otherwise stable exam.

## 2019-08-08 IMAGING — DX DG CHEST 1V PORT
1 series · 1 of 1 positions shown · non-contrast
Comparison: Radiograph yesterday.

CLINICAL DATA: Respiratory failure. Pulmonary infiltrates.

EXAM:
PORTABLE CHEST 1 VIEW

[chest ap]
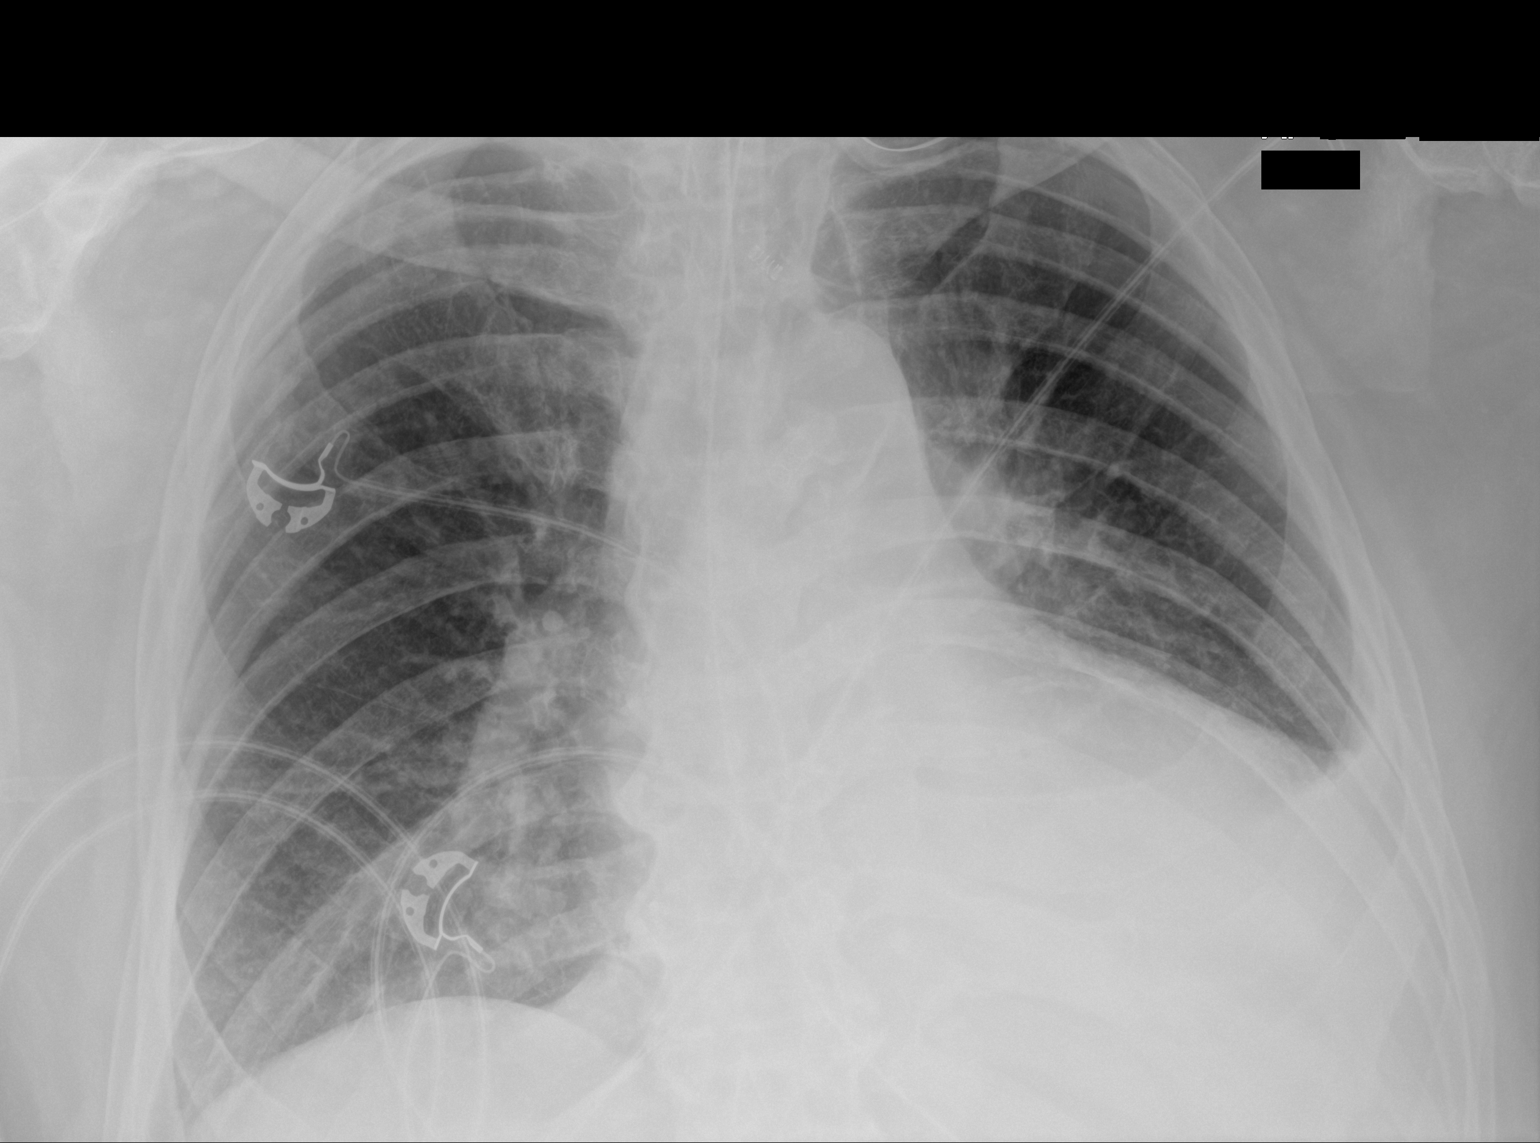

[1 of 1 positions shown; findings below may reference images not displayed]

FINDINGS: Endotracheal tube tip at the thoracic inlet. Enteric tube tip below
the diaphragm not included in the field of view. Unchanged left lung
base opacity likely combination of pleural fluid and airspace
disease. Stable heart size and mediastinal contours. No
pneumothorax.
IMPRESSION: 1. Unchanged left lung base opacity likely combination of pleural
fluid and airspace disease.
2. Stable support apparatus.

## 2019-08-08 MED ORDER — COSYNTROPIN 0.25 MG IJ SOLR
0.2500 mg | Freq: Once | INTRAMUSCULAR | Status: AC
  Administered 2019-08-08: 11:00:00 0.25 mg via INTRAVENOUS
  Filled 2019-08-08 (×3): qty 0.25

## 2019-08-08 MED ORDER — VANCOMYCIN HCL 1250 MG/250ML IV SOLN
1250.0000 mg | INTRAVENOUS | Status: DC
  Administered 2019-08-09 – 2019-08-10 (×2): 1250 mg via INTRAVENOUS
  Filled 2019-08-08 (×3): qty 250

## 2019-08-08 MED ORDER — FREE WATER
300.0000 mL | Status: DC
  Administered 2019-08-08 – 2019-08-09 (×5): 300 mL

## 2019-08-08 MED ORDER — PIPERACILLIN-TAZOBACTAM 3.375 G IVPB
3.3750 g | Freq: Three times a day (TID) | INTRAVENOUS | Status: DC
  Administered 2019-08-08 – 2019-08-09 (×4): 3.375 g via INTRAVENOUS
  Filled 2019-08-08 (×4): qty 50

## 2019-08-08 MED ORDER — VANCOMYCIN HCL 2000 MG/400ML IV SOLN
2000.0000 mg | Freq: Once | INTRAVENOUS | Status: AC
  Administered 2019-08-08: 2000 mg via INTRAVENOUS
  Filled 2019-08-08: qty 400

## 2019-08-08 NOTE — Progress Notes (Addendum)
Pharmacy Antibiotic Note  Antonio [name] is a morbidly obese 74 y.o. male admitted on 07/27/2019 with AKI, severe respiratory distress, and acute respiratory failure. EMS stated pt walked to truck, collapsed on stretcher, and became unresponsive. Pt is intubated. WBC have been trending upward since 01/17. BCx and MRSA PCR have been ordered. MRSA PCR (results from 01/20) were negative. Pt was febrile earlier this morning and continues to have altered mental status. Pharmacy has been consulted for Zosyn and Vancomycin dosing for pneumonia.  Plan: Zosyn 3.375g IV Q8H, extended infusion.    Vanc 2000 mg IV x 1 followed by vancomycin 1250 mg IV Q 24 hrs  Goal AUC 400-550 Expected AUC: 480 SCr used: 1.16   Height: 5' 7.01" (170.2 cm) Weight: 240 lb 1.3 oz (108.9 kg) IBW/kg (Calculated) : 66.12  Temp (24hrs), Avg:99.5 F (37.5 C), Min:98.2 F (36.8 C), Max:100.8 F (38.2 C)  Recent Labs  Lab 08/02/19 0410 08/03/19 0340 08/04/19 0345 08/05/19 0632 08/06/19 0732 08/07/19 0359 08/08/19 0927  WBC 9.5  --  10.5 10.6* 12.3* 11.5*  --   CREATININE 1.15   < > 1.08 1.08 1.10 1.01 1.16   < > = values in this interval not displayed.    Estimated Creatinine Clearance: 65.7 mL/min (by C-G formula based on SCr of 1.16 mg/dL).    Not on File  Antimicrobials this admission: Ciprofloxacin 01/10 >> 01/11 Ceftriaxone 01/11 >> 01/12 Unasyn 01/13 >> 01/15 Zosyn 01/20 >> Vanc 01/20 >>   Dose adjustments this admission: NA  Microbiology results: 01/08 BCx: No growth for 5 days (finalized 01/13) 01/20 BCx: pending 01/08 UCx: No growth 01/09 UCx: No growth   01/11 Sputum: Rare consistent with normal respiratory flora  01/09 MRSA PCR: Negative 01/20 MRSA PCR: Negative 01/08 SARS Coronavirus 2 by PCR: Negative 01/08 Influenza A/B by PCR: Negative   Thank you for allowing pharmacy to be a part of this patient's care.  Dorena Bodo, PharmD 08/08/2019 11:59 AM

## 2019-08-08 NOTE — Consult Note (Signed)
PHARMACY CONSULT NOTE - FOLLOW UP  Pharmacy Consult for Electrolyte Monitoring and Replacement   Maverick Ponting is a morbidly obese 74 y.o. male admitted on 07/27/2019. EMS stated pt walked to truck, collapsed on stretcher, and became unresponsive. Pt was apneic, but still with pulse. Pt has AKI, severe respiratory distress, and acute respiratory failure. Pt is intubated on mechanical ventilation. Per am rounds, no more sedatives given today. Furosemide IV was also stopped today. Pharmacy consulted to assist with monitoring and replacing electrolytes.   Recent Labs: Potassium (mmol/L)  Date Value  08/08/2019 3.6   Magnesium (mg/dL)  Date Value  08/08/2019 2.6 (H)   Calcium (mg/dL)  Date Value  08/08/2019 8.6 (L)   Albumin (g/dL)  Date Value  08/08/2019 2.9 (L)   Phosphorus (mg/dL)  Date Value  08/06/2019 3.3   Sodium (mmol/L)  Date Value  08/08/2019 153 (H)     Assessment: 1. Electrolytes: Sodium levels are still increasing despite the free water flush 250 mL Q4H. The free water flush increased to 326mL per tube Q4H. Furosemide was D/C'ed today (last dose given at 0856). Corrected calcium level is 9.48 (WNL), accounted for the low albumin. Magnesium is also higher than normal. Continue monitoring electrolytes in am labs. Will replace to achieve target goal for potassium ~4 and magnesium ~2.   2. Glucose: Today's range: 118-140. Glucose is trending higher than yesterday. Glucose-capillary monitored Q4H. Continue insulin aspart 0-15 units [place] Q4H and monitor glucose levels daily.   3. Constipation: All fentanyl IV sources have been discontinued today. Last bowel movement was today. Continue to monitor and add constipation-relief meds if constipation recurs.    Roanna Banning ,Pharmacy Student Clinical Pharmacist 08/08/2019 11:30 AM

## 2019-08-08 NOTE — Progress Notes (Signed)
CRITICAL CARE PROGRESS NOTE    Name: Antonio [name] MRN: [id number] DOB: 11/10/1945     LOS: 64   SUBJECTIVE FINDINGS & SIGNIFICANT EVENTS   Patient description: Antonio [name] is a 74 yo male with a history of alcohol abuse, tobacco abuse and suspected COPD who presented to the ED on 07/27/2019 as a Antonio [name] after being found down by EMS.  He was admitted to the ICU in acute respiratory distress, intubated and sedated.  On further workup, he was found to have a grade I diastolic dysfunction, moderate aortic stenosis and AKI.     Lines / Drains: Midline single lumen left cephalic PIV right forearm NG/OG tube Urethral catheter Rectal tube ET tube x 9 days  Cultures / Sepsis markers: Respiratory culture 1/11 consistent with normal respiratory flora.  Negative for MRSA, Influenza A/B, Sars-cov-2.  Blood cultures 1/8 showed no growth at 5 days.    Patient remains afebrile at 98.55F.     Protocols / Consultants: CIWA protocol  Tests / Events: 1/9 severe resp failure acute sCHF, intubated 1/8 1/10 SAT/SBT pending, +UTI continue ABX 1/11 patient failed wean due to agitation and increased work of breathing 1/12 patient failed weaning trial 1/13 bradycardia at 48 bpm. No elevation in troponin. Family discussion-patient is DNR  1/14 failed SAT/SBT due to severe delirium and WOB 1/16 Slow to awaken but no asynchrony with sedation vacation 1/17 developed A. fib flutter with controlled ventricular response 1/18 patient remains in anasarca - now on lasix gtt 1/19 MRI brain with no acute CVA but + right temporal encephalomalacia and a meningioma found incidentally 1/20- patient continues to have altered mentation and fever this am. Repeating septic workup.  EEG today. MRI brain yesterday no acute findings. Neuro  consultation     PAST MEDICAL HISTORY   Past Medical History:  Diagnosis Date  . ETOH abuse   . Smoker      SURGICAL HISTORY   History reviewed. No pertinent surgical history.   FAMILY HISTORY   History reviewed. No pertinent family history.   SOCIAL HISTORY   Social History   Tobacco Use  . Smoking status: Current Every Day Smoker    Packs/day: 0.50    Years: 55.00    Pack years: 27.50    Types: Cigarettes  . Smokeless tobacco: Never Used  Substance Use Topics  . Alcohol use: Never  . Drug use: Never     MEDICATIONS   Current Medication:  Current Facility-Administered Medications:  .  0.9 %  sodium chloride infusion, 250 mL, Intravenous, PRN, Flora Lipps, MD, Last Rate: 5 mL/hr at 08/08/19 0804, Rate Verify at 08/08/19 0804 .  acetaminophen (TYLENOL) tablet 650 mg, 650 mg, Oral, Q4H PRN, Flora Lipps, MD, 650 mg at 08/08/19 0806 .  amLODipine (NORVASC) tablet 10 mg, 10 mg, Per Tube, Daily, Awilda Bill, NP, 10 mg at 08/07/19 0915 .  chlorhexidine gluconate (MEDLINE KIT) (PERIDEX) 0.12 % solution 15 mL, 15 mL, Mouth Rinse, BID, Kasa, Kurian, MD, 15 mL at 08/07/19 2020 .  Chlorhexidine Gluconate Cloth 2 % PADS 6 each, 6 each, Topical, Daily, Flora Lipps, MD, 6 each at 08/07/19 2143 .  enoxaparin (LOVENOX) injection 40 mg, 40 mg, Subcutaneous, Q24H, Hallaji, Sheema M, RPH, 40 mg at 08/07/19 0916 .  famotidine (PEPCID) tablet 20 mg, 20 mg, Per Tube, BID, Flora Lipps, MD, 20 mg at 08/07/19 2143 .  feeding supplement (PRO-STAT SUGAR FREE 64) liquid 60 mL, 60 mL, Per Tube, BID, Chandon Lazcano,  Chanceler Pullin, MD .  feeding supplement (VITAL HIGH PROTEIN) liquid 1,000 mL, 1,000 mL, Per Tube, Q24H, Tangia Pinard, MD, 1,000 mL at 08/07/19 1641 .  fentaNYL (SUBLIMAZE) injection 50 mcg, 50 mcg, Intravenous, Q15 min PRN, Flora Lipps, MD, 50 mcg at 07/29/19 1652 .  fentaNYL (SUBLIMAZE) injection 50-200 mcg, 50-200 mcg, Intravenous, Q30 min PRN, Flora Lipps, MD, 100 mcg at  08/06/19 0208 .  fentaNYL 2544mg in NS 2513m(1030mml) infusion-PREMIX, 0-400 mcg/hr, Intravenous, Continuous, KasFlora LippsD, Stopped at 08/05/19 1226 .  folic acid (FOLVITE) tablet 1 mg, 1 mg, Per Tube, Daily, Kasa, Kurian, MD, 1 mg at 08/07/19 0915 .  free water 250 mL, 250 mL, Per Tube, Q4H, BlaAwilda BillP, 250 mL at 08/08/19 0804 .  furosemide (LASIX) 250 mg in dextrose 5 % 250 mL (1 mg/mL) infusion, 4 mg/hr, Intravenous, Continuous, Zyaira Vejar, MD, Last Rate: 4 mL/hr at 08/08/19 0804, 4 mg/hr at 08/08/19 0804 .  hydrALAZINE (APRESOLINE) injection 20 mg, 20 mg, Intravenous, Q4H PRN, GonTyler PitaD, 20 mg at 08/08/19 0304 .  insulin aspart (novoLOG) injection 0-15 Units, 0-15 Units, Subcutaneous, Q4H, SimCharlett NosePH, 2 Units at 08/08/19 0435 .  ipratropium-albuterol (DUONEB) 0.5-2.5 (3) MG/3ML nebulizer solution 3 mL, 3 mL, Nebulization, Q6H, AleLanney Ginsuad, MD, 3 mL at 08/08/19 0729 .  liver oil-zinc oxide (DESITIN) 40 % ointment, , Topical, BID, AleOttie GlazierD, Given at 08/07/19 2146 .  MEDLINE mouth rinse, 15 mL, Mouth Rinse, 10 times per day, KasFlora LippsD, 15 mL at 08/08/19 0518 .  metoprolol tartrate (LOPRESSOR) injection 2.5-5 mg, 2.5-5 mg, Intravenous, Q4H PRN, GonTyler PitaD, 5 mg at 08/08/19 0436 .  midazolam (VERSED) injection 2 mg, 2 mg, Intravenous, Q15 min PRN, KasFlora LippsD, 2 mg at 08/07/19 2151 .  midazolam (VERSED) injection 2 mg, 2 mg, Intravenous, Q2H PRN, KasMortimer Friesurian, MD .  multivitamin liquid 15 mL, 15 mL, Oral, Daily, Kasa, Kurian, MD, 15 mL at 08/07/19 0916 .  nitroGLYCERIN (NITROGLYN) 2 % ointment 0.5 inch, 0.5 inch, Topical, Q6H, BlaAwilda BillP, 0.5 inch at 08/08/19 0517 .  ondansetron (ZOFRAN) injection 4 mg, 4 mg, Intravenous, Q6H PRN, KasMortimer Friesurian, MD .  sodium chloride flush (NS) 0.9 % injection 10-40 mL, 10-40 mL, Intracatheter, PRN, KasFlora LippsD, 10 mL at 08/03/19 0929 .  [COMPLETED] thiamine '500mg'$  in  normal saline (34m36mVPB, 500 mg, Intravenous, Daily, Last Rate: 100 mL/hr at 08/05/19 1033, 500 mg at 08/05/19 1033 **FOLLOWED BY** thiamine tablet 100 mg, 100 mg, Per Tube, Daily, Kasa, Kurian, MD, 100 mg at 08/07/19 0915    ALLERGIES   Patient has no allergy information on record.    REVIEW OF SYSTEMS    Unable to obtain due to severity of patient condition.  PHYSICAL EXAMINATION   Vital Signs: Temp:  [98.2 F (36.8 C)-100.8 F (38.2 C)] 100.8 F (38.2 C) (01/20 0800) Pulse Rate:  [35-120] 102 (01/20 0800) Resp:  [15-35] 31 (01/20 0800) BP: (113-182)/(33-64) 156/60 (01/20 0800) SpO2:  [89 %-100 %] 100 % (01/20 0800) FiO2 (%):  [30 %-40 %] 30 % (01/20 0800) Weight:  [108.9 kg] 108.9 kg (01/20 0436)  GENERAL:Patient is an ill-appearing obese man.  Intubated HEAD: Normocephalic, atraumatic.  EYES: Pupils equal, round, slowly reactive to light.  No scleral icterus.  MOUTH: Moist mucosal membrane. NECK: Supple. No thyromegaly. No nodules. No JVD.  PULMONARY: Wheezing and rhonchi present bilaterally diminished in bilateral lower lobes CARDIOVASCULAR: S1  and S2. Regular rate and rhythm. Distant heart sounds.  Systolic murmur at right upper sternal border consistent with aortic stenosis. GASTROINTESTINAL: Soft, nontender, non-distended. No masses. Positive bowel sounds. No hepatosplenomegaly.  MUSCULOSKELETAL: Edema in bilateral fingers.   NEUROLOGIC: Obtunded.  Pupils slowly reactive. Corneal reflex sluggish.  Cough reflex intact.  Patient does not follow commands.  Makes non-purposeful movements of extremities.   SKIN:intact,warm,dry   PERTINENT DATA     Infusions: . sodium chloride 5 mL/hr at 08/08/19 0804  . fentaNYL infusion INTRAVENOUS Stopped (08/05/19 1226)  . furosemide (LASIX) infusion 4 mg/hr (08/08/19 0804)   Scheduled Medications: . amLODipine  10 mg Per Tube Daily  . chlorhexidine gluconate (MEDLINE KIT)  15 mL Mouth Rinse BID  . Chlorhexidine Gluconate  Cloth  6 each Topical Daily  . enoxaparin (LOVENOX) injection  40 mg Subcutaneous Q24H  . famotidine  20 mg Per Tube BID  . feeding supplement (PRO-STAT SUGAR FREE 64)  60 mL Per Tube BID  . feeding supplement (VITAL HIGH PROTEIN)  1,000 mL Per Tube Q24H  . folic acid  1 mg Per Tube Daily  . free water  250 mL Per Tube Q4H  . insulin aspart  0-15 Units Subcutaneous Q4H  . ipratropium-albuterol  3 mL Nebulization Q6H  . liver oil-zinc oxide   Topical BID  . mouth rinse  15 mL Mouth Rinse 10 times per day  . multivitamin  15 mL Oral Daily  . nitroGLYCERIN  0.5 inch Topical Q6H  . thiamine  100 mg Per Tube Daily   PRN Medications: sodium chloride, acetaminophen, fentaNYL (SUBLIMAZE) injection, fentaNYL (SUBLIMAZE) injection, hydrALAZINE, metoprolol tartrate, midazolam, midazolam, ondansetron (ZOFRAN) IV, sodium chloride flush Hemodynamic parameters:   Intake/Output: 01/19 0701 - 01/20 0700 In: 1238.8 [I.V.:206.1; NG/GT:1032.7] Out: 2600 [Urine:2500; Stool:100]  Ventilator  Settings: Vent Mode: PRVC FiO2 (%):  [30 %-40 %] 30 % Set Rate:  [15 bmp-20 bmp] 15 bmp Vt Set:  [450 mL-500 mL] 450 mL PEEP:  [5 cmH20] 5 cmH20 Pressure Support:  [10 cmH20] 10 cmH20 Plateau Pressure:  [18 cmH20] 18 cmH20   LAB RESULTS:  Basic Metabolic Panel: Recent Labs  Lab 08/03/19 0340 08/03/19 0340 08/04/19 0345 08/04/19 0345 08/05/19 9735 08/05/19 0632 08/06/19 0732 08/07/19 0359  NA 143  --  146*  --  147*  --  149* 152*  K 4.0   < > 3.5   < > 4.7   < > 4.4 3.6  CL 110  --  114*  --  113*  --  115* 114*  CO2 24  --  25  --  26  --  23 29  GLUCOSE 136*  --  118*  --  114*  --  147* 111*  BUN 58*  --  51*  --  56*  --  58* 54*  CREATININE 1.04  --  1.08  --  1.08  --  1.10 1.01  CALCIUM 8.3*  --  8.2*  --  8.9  --  9.0 8.8*  MG  --   --  2.6*  --   --   --  2.6* 2.1  PHOS  --   --  2.3*  --   --   --  3.3  --    < > = values in this interval not displayed.   Liver Function Tests: No  results for input(s): AST, ALT, ALKPHOS, BILITOT, PROT, ALBUMIN in the last 168 hours. No results for input(s): LIPASE, AMYLASE in  the last 168 hours. No results for input(s): AMMONIA in the last 168 hours. CBC: Recent Labs  Lab 08/02/19 0410 08/04/19 0345 08/05/19 0632 08/06/19 0732 08/07/19 0359  WBC 9.5 10.5 10.6* 12.3* 11.5*  NEUTROABS  --  6.8  --  9.8* 8.6*  HGB 11.8* 13.1 13.6 14.0 14.1  HCT 36.3* 42.5 44.3 43.9 43.0  MCV 96.5 101.9* 101.6* 98.9 97.7  PLT 223 249 282 257 282   Cardiac Enzymes: No results for input(s): CKTOTAL, CKMB, CKMBINDEX, TROPONINI in the last 168 hours. BNP: Invalid input(s): POCBNP CBG: Recent Labs  Lab 08/07/19 1634 08/07/19 1939 08/07/19 2335 08/08/19 0415 08/08/19 0714  GLUCAP 111* 115* 129* 125* 118*     IMAGING RESULTS:  Imaging: MR BRAIN W WO CONTRAST  Result Date: 08/07/2019 CLINICAL DATA:  Found down EXAM: MRI HEAD WITHOUT AND WITH CONTRAST TECHNIQUE: Multiplanar, multiecho pulse sequences of the brain and surrounding structures were obtained without and with intravenous contrast. CONTRAST:  60m GADAVIST GADOBUTROL 1 MMOL/ML IV SOLN COMPARISON:  None. FINDINGS: Motion artifact is present. Brain: There is no acute infarction or intracranial hemorrhage. There is a 1.2 x 0.5 cm enhancing lesion along the left aspect of the falx with mild susceptibility is compatible with a small meningioma. There is no mass effect, hydrocephalus, or extra-axial collection. Minimal patchy T2 hyperintensity in the supratentorial white is nonspecific but may reflect minor chronic microvascular ischemic changes. There is left temporal encephalomalacia/gliosis. Vascular: Major vessel flow voids at the skull base are preserved. Skull and upper cervical spine: Normal marrow signal. Sinuses/Orbits: Paranasal sinus mucosal thickening, with greatest involvement of the maxillary and left sphenoid sinuses. Other: Patchy mastoid fluid opacification.  Sella is  unremarkable. IMPRESSION: No evidence of recent infarction or intracranial hemorrhage. Incidental small falcine meningioma. Left temporal encephalomalacia. Electronically Signed   By: PMacy MisM.D.   On: 08/07/2019 14:57   DG Chest Port 1 View  Result Date: 08/08/2019 CLINICAL DATA:  Respiratory failure. Pulmonary infiltrates. EXAM: PORTABLE CHEST 1 VIEW COMPARISON:  Radiograph yesterday. FINDINGS: Endotracheal tube tip at the thoracic inlet. Enteric tube tip below the diaphragm not included in the field of view. Unchanged left lung base opacity likely combination of pleural fluid and airspace disease. Stable heart size and mediastinal contours. No pneumothorax. IMPRESSION: 1. Unchanged left lung base opacity likely combination of pleural fluid and airspace disease. 2. Stable support apparatus. Electronically Signed   By: MKeith RakeM.D.   On: 08/08/2019 06:01   DG Chest Port 1 View  Result Date: 08/07/2019 CLINICAL DATA:  Respiratory distress EXAM: PORTABLE CHEST 1 VIEW COMPARISON:  08/05/2019 FINDINGS: Endotracheal tube tip is in unchanged position at the level of the clavicular heads. Enteric tube courses below the field of view. Small left pleural effusion is unchanged. There is left basilar atelectasis. Cardiomediastinal contours are normal. IMPRESSION: Unchanged small left pleural effusion and left basilar atelectasis. Electronically Signed   By: KUlyses JarredM.D.   On: 08/07/2019 03:12        ASSESSMENT AND PLAN    -Multidisciplinary rounds held today  Acute Hypoxic Respiratory Failure due to pulmonary edema, bilateral pleural effusions, Grade I diastolic heart failure and suspected history of COPD - Currently maintaining SpO2 96% on 30% FiO2, PEEP 5.  CT 1/17 demonstrated increasing bilateral pleural effusions and bibasilar airspace disease and increasing pulmonary edema.   -continue Full MV support -continue Bronchodilator Therapy with budesonide and duoneb -Wean Fio2 and  PEEP as tolerated -will perform SAT/SBT when respiratory  parameters are met - stopping lasix today- due to signs of volume contraction now   Altered mental status -patient is able to open eyes with sternal rub and does visually track but still poorly responsive without sedation.  - He had Versed 84m last night at 9pm - MRI brain done yesterday wihout CVA, signs of acute meningitis, ICH/SAH noted -will perform ACTH stim test today\ -will obtain EEG today -TSH is low, subclinical hyperthyroidism due to acutely ill state - there is also small component of metabolic encephalopathy due to mild elevation of uremia -considering neurology consultation -septic workup thus far negative - blood cultures X5days no growth, patient did have fever mild over past 24 hours and this am. Will repeat septic workup and broaden anbitbiotics to vanc/zosyn   CARDIAC FAILURE with grade I diastolic dysfunction -oxygen as needed -Lasix as tolerated -follow up cardiac enzymes as indicated - Continue topical nitroglycerin ICU monitoring  Hypertension - BP continues to stay in the 175/37 mmHg range. - amlodipine 10 mg daily - Metoprolol  Q4H prn for hypertension - Clonidine 0.1 mg BID   Alcoholism Monitoring for signs of withdrawal - History of alcohol abuse - Continue thiamine 1453mg daily, folic acid 1 mg daily - Modified CIWA protocol with hydralazine     Hypernatremia and hyperchloridemia - Sodium increased to 149 today from 147 yesterday.  Chloride increased from 113 yesterday to 115 today. - Continue free water 200 mL Q4H   NEUROLOGY - intubated and sedated with fentanyl 100 mcg - minimal sedation to achieve a RASS goal: -1 Wake up assessment pending - Patient remains obtunded after discontinuing sedation with sluggish pupils.  Is not following commands.  - Due to mental status, hypertension, and widened pulse pressure, consider MRI head.   GI/Nutrition GI PROPHYLAXIS as  indicated DIET-->Continue TF's as tolerated Patient continues to have loose bowel movements- rectal tube in place.  Hold stool softeners.     ENDO - ICU hypoglycemic\Hyperglycemia protocol -check FSBS per protocol - Continue insulin sliding scale  Intertrigo in groin - Begin application of hydrocerin cream BID. - Will continue nursing wound care and bedding changes to reduce the risk of further breakdown.   Goals of care - Patient DNR status  - Patient has been intubated for 9 days.  Plan family meeting in the next couple of days to discuss patient prognosis and goals of care including PEG and trach placement if no clinical improvement.   ELECTROLYTES -follow labs as needed -replace as needed  -pharmacy consultation   DVT/GI PRX ordered - Lovenox 40 mg QD for DVT prophylaxis TRANSFUSIONS AS NEEDED MONITOR FSBS ASSESS the need for LABS as needed   Critical care provider statement:    Critical care time (minutes):  109   Critical care time was exclusive of:  Separately billable procedures and treating other patients   Critical care was necessary to treat or prevent imminent or life-threatening deterioration of the following conditions:  Acute respiratory failure, Delerium tremens   Critical care was time spent personally by me on the following activities:  Development of treatment plan with patient or surrogate, discussions with consultants, evaluation of patient's response to treatment, examination of patient, obtaining history from patient or surrogate, ordering and performing treatments and interventions, ordering and review of laboratory studies and re-evaluation of patient's condition.  I assumed direction of critical care for this patient from another provider in my specialty: no    Patient remains critically ill with multi-organ failure.  At risk  of developing cardiac arrest and death. Patient currently DNR status.    Ottie Glazier, M.D.  Division of [place]

## 2019-08-08 NOTE — Progress Notes (Signed)
eeg completed ° °

## 2019-08-08 NOTE — Procedures (Signed)
ELECTROENCEPHALOGRAM REPORT   Patient: Antonio [name]       Room #: IC03A-AA EEG No. ID: [id number] Age: 74 y.o.        Sex: male Referring Physician: Lanney Gins Report Date:  08/08/2019        Interpreting Physician: Alexis Goodell  History: Antonio [name] is an 74 y.o. male found down  Medications:  Norvasc, Insulin, Zosyn, Vancomycin, Thiamine  Conditions of Recording:  This is a 21 channel routine scalp EEG performed with bipolar and monopolar montages arranged in accordance to the international 10/20 system of electrode placement. One channel was dedicated to EKG recording.  The patient is in the intubated and unresponsive state.  Description:  Artifact is prominent during the recording often obscuring the background rhythm. When able to be visualized the background is slow and poorly organized.  At times some poorly sustained alpha activity can be identified but the predominant rhythms are theta and delta rhythms.  This activity is diffusely distributed and continuous.   No epileptiform activity is noted.   The patient is stimulated during the recording with no activation of the background rhythm noted.   Hyperventilation and intermittent photic stimulation were not performed.   IMPRESSION: This is an abnormal EEG secondary to general background slowing.  This finding may be seen with a diffuse disturbance that is etiologically nonspecific, but may include a metabolic encephalopathy, among other possibilities.  No epileptiform activity is noted.     Alexis Goodell, MD Neurology [phone] 08/08/2019, 5:35 PM

## 2019-08-08 NOTE — Procedures (Signed)
POWER PICC -TRIPLE LUMEN PROCEDURE NOTE  Indication: Patient receiving vesicant or irritant drug.; Patient receiving intravenous therapy for longer than 5 days.; Patient has limited or no vascular access.   Consent:[facility]    Hand washing performed prior to starting the procedure.   Procedure:   An active timeout was performed and correct patient, name, & ID confirmed.   Patient was positioned correctly for central venous access.  Patient was prepped using strict sterile technique including chlorohexadine preps, sterile drape, sterile gown and sterile gloves.    The area was prepped, draped and anesthetized in the usual sterile manner. Patient comfort was obtained.    A triple lumen catheter was placed in Right Brachiocephalic Vein There was good blood return, catheter caps were placed on lumens, catheter flushed easily, the line was secured and a sterile dressing and BIO-PATCH applied.   Ultrasound was used to visualize vasculature and guidance of needle.   Number of Attempts: 1 Complications:none Estimated Blood Loss: none Chest Radiograph indicated and ordered.      Ottie Glazier, M.D.  Pulmonary & [place]

## 2019-08-09 LAB — CBC
HCT: 40.7 % (ref 39.0–52.0)
Hemoglobin: 12.5 g/dL — ABNORMAL LOW (ref 13.0–17.0)
MCH: 31.4 pg (ref 26.0–34.0)
MCHC: 30.7 g/dL (ref 30.0–36.0)
MCV: 102.3 fL — ABNORMAL HIGH (ref 80.0–100.0)
Platelets: 242 10*3/uL (ref 150–400)
RBC: 3.98 MIL/uL — ABNORMAL LOW (ref 4.22–5.81)
RDW: 14.1 % (ref 11.5–15.5)
WBC: 13.7 10*3/uL — ABNORMAL HIGH (ref 4.0–10.5)
nRBC: 0 % (ref 0.0–0.2)

## 2019-08-09 LAB — BASIC METABOLIC PANEL
Anion gap: 8 (ref 5–15)
BUN: 60 mg/dL — ABNORMAL HIGH (ref 8–23)
CO2: 32 mmol/L (ref 22–32)
Calcium: 8.6 mg/dL — ABNORMAL LOW (ref 8.9–10.3)
Chloride: 114 mmol/L — ABNORMAL HIGH (ref 98–111)
Creatinine, Ser: 0.97 mg/dL (ref 0.61–1.24)
GFR calc Af Amer: >60 mL/min (ref >60)
GFR calc non Af Amer: >60 mL/min (ref >60)
Glucose, Bld: 142 mg/dL — ABNORMAL HIGH (ref 70–99)
Potassium: 2.8 mmol/L — ABNORMAL LOW (ref 3.5–5.1)
Sodium: 154 mmol/L — ABNORMAL HIGH (ref 135–145)

## 2019-08-09 LAB — GLUCOSE, CAPILLARY
Glucose-Capillary: 100 mg/dL — ABNORMAL HIGH (ref 70–99)
Glucose-Capillary: 105 mg/dL — ABNORMAL HIGH (ref 70–99)
Glucose-Capillary: 110 mg/dL — ABNORMAL HIGH (ref 70–99)
Glucose-Capillary: 115 mg/dL — ABNORMAL HIGH (ref 70–99)
Glucose-Capillary: 118 mg/dL — ABNORMAL HIGH (ref 70–99)
Glucose-Capillary: 125 mg/dL — ABNORMAL HIGH (ref 70–99)
Glucose-Capillary: 128 mg/dL — ABNORMAL HIGH (ref 70–99)

## 2019-08-09 LAB — PROCALCITONIN: Procalcitonin: 0.25 ng/mL

## 2019-08-09 MED ORDER — NYSTATIN 100000 UNIT/ML MT SUSP
5.0000 mL | Freq: Four times a day (QID) | OROMUCOSAL | Status: DC
  Administered 2019-08-09 – 2019-08-20 (×22): 500000 [IU] via ORAL
  Filled 2019-08-09 (×38): qty 5

## 2019-08-09 MED ORDER — POTASSIUM CHLORIDE 20 MEQ/15ML (10%) PO SOLN
40.0000 meq | Freq: Two times a day (BID) | ORAL | Status: AC
  Administered 2019-08-09 (×2): 40 meq via ORAL
  Filled 2019-08-09 (×2): qty 30

## 2019-08-09 MED ORDER — SODIUM CHLORIDE 0.9 % IV SOLN
2.0000 g | INTRAVENOUS | Status: DC
  Administered 2019-08-09: 2 g via INTRAVENOUS
  Filled 2019-08-09 (×2): qty 20

## 2019-08-09 MED ORDER — FREE WATER
350.0000 mL | Status: DC
  Administered 2019-08-09: 350 mL

## 2019-08-09 MED ORDER — FUROSEMIDE 10 MG/ML IJ SOLN
40.0000 mg | Freq: Once | INTRAMUSCULAR | Status: AC
  Administered 2019-08-09: 40 mg via INTRAVENOUS
  Filled 2019-08-09: qty 4

## 2019-08-09 MED ORDER — FREE WATER
400.0000 mL | Status: DC
  Administered 2019-08-09 – 2019-08-11 (×11): 400 mL

## 2019-08-09 MED ORDER — CHLORHEXIDINE GLUCONATE 0.12 % MT SOLN
OROMUCOSAL | Status: AC
  Administered 2019-08-09: 09:00:00 15 mL via OROMUCOSAL
  Filled 2019-08-09: qty 15

## 2019-08-09 NOTE — Progress Notes (Signed)
CRITICAL CARE PROGRESS NOTE    Name: Antonio [name] MRN: [id number] DOB: 07/27/1945     LOS: 72   SUBJECTIVE FINDINGS & SIGNIFICANT EVENTS   Patient description: Antonio [name] is a 74 yo male with a history of alcohol abuse, tobacco abuse and suspected COPD who presented to the ED on 07/27/2019 as a Antonio [name] after being found down by EMS.  He was admitted to the ICU in acute respiratory distress, intubated and sedated.  On further workup, he was found to have a grade I diastolic dysfunction, moderate aortic stenosis and AKI.     Lines / Drains: Midline single lumen left cephalic PIV right forearm NG/OG tube Urethral catheter Rectal tube ET tube x 9 days  Cultures / Sepsis markers: Respiratory culture 1/11 consistent with normal respiratory flora.  Negative for MRSA, Influenza A/B, Sars-cov-2.  Blood cultures 1/8 showed no growth at 5 days.    Patient remains afebrile at 98.52F.     Protocols / Consultants: CIWA protocol  Tests / Events: 1/9 severe resp failure acute sCHF, intubated 1/8 1/10 SAT/SBT pending, +UTI continue ABX 1/11 patient failed wean due to agitation and increased work of breathing 1/12 patient failed weaning trial 1/13 bradycardia at 48 bpm. No elevation in troponin. Family discussion-patient is DNR  1/14 failed SAT/SBT due to severe delirium and WOB 1/16 Slow to awaken but no asynchrony with sedation vacation 1/17 developed A. fib flutter with controlled ventricular response 1/18 patient remains in anasarca - now on lasix gtt 1/19 MRI brain with no acute CVA but + right temporal encephalomalacia and a meningioma found incidentally 1/20- patient continues to have altered mentation and fever this am. Repeating septic workup.  EEG today. MRI brain yesterday no acute findings. Neuro  consultation 1/21-patient mildly improved in terms of mentation, discussed case with neurology today.  Possible LP    PAST MEDICAL HISTORY   Past Medical History:  Diagnosis Date  . ETOH abuse   . Smoker      SURGICAL HISTORY   History reviewed. No pertinent surgical history.   FAMILY HISTORY   History reviewed. No pertinent family history.   SOCIAL HISTORY   Social History   Tobacco Use  . Smoking status: Current Every Day Smoker    Packs/day: 0.50    Years: 55.00    Pack years: 27.50    Types: Cigarettes  . Smokeless tobacco: Never Used  Substance Use Topics  . Alcohol use: Never  . Drug use: Never     MEDICATIONS   Current Medication:  Current Facility-Administered Medications:  .  0.9 %  sodium chloride infusion, 250 mL, Intravenous, PRN, Flora Lipps, MD, Stopped at 08/09/19 0845 .  acetaminophen (TYLENOL) tablet 650 mg, 650 mg, Oral, Q4H PRN, Flora Lipps, MD, 650 mg at 08/08/19 2044 .  amLODipine (NORVASC) tablet 10 mg, 10 mg, Per Tube, Daily, Awilda Bill, NP, 10 mg at 08/09/19 0842 .  cefTRIAXone (ROCEPHIN) 2 g in sodium chloride 0.9 % 100 mL IVPB, 2 g, Intravenous, Q24H, Ottie Glazier, MD, Stopped at 08/09/19 1437 .  chlorhexidine gluconate (MEDLINE KIT) (PERIDEX) 0.12 % solution 15 mL, 15 mL, Mouth Rinse, BID, Kasa, Kurian, MD, 15 mL at 08/09/19 0843 .  Chlorhexidine Gluconate Cloth 2 % PADS 6 each, 6 each, Topical, Daily, Flora Lipps, MD, 6 each at 08/09/19 0436 .  enoxaparin (LOVENOX) injection 40 mg, 40 mg, Subcutaneous, Q24H, Hallaji, Sheema M, RPH, 40 mg at 08/09/19 0842 .  famotidine (PEPCID) tablet 20  mg, 20 mg, Per Tube, BID, Flora Lipps, MD, 20 mg at 08/09/19 0842 .  feeding supplement (PRO-STAT SUGAR FREE 64) liquid 60 mL, 60 mL, Per Tube, BID, , , MD, 60 mL at 08/09/19 0841 .  feeding supplement (VITAL HIGH PROTEIN) liquid 1,000 mL, 1,000 mL, Per Tube, Q24H, , , MD, 1,000 mL at 08/09/19 1640 .  folic acid  (FOLVITE) tablet 1 mg, 1 mg, Per Tube, Daily, Flora Lipps, MD, 1 mg at 08/09/19 0842 .  free water 400 mL, 400 mL, Per Tube, Q4H, , , MD, 400 mL at 08/09/19 1600 .  hydrALAZINE (APRESOLINE) injection 20 mg, 20 mg, Intravenous, Q4H PRN, Tyler Pita, MD, 20 mg at 08/08/19 0304 .  insulin aspart (novoLOG) injection 0-15 Units, 0-15 Units, Subcutaneous, Q4H, Charlett Nose, RPH, 2 Units at 08/09/19 0441 .  ipratropium-albuterol (DUONEB) 0.5-2.5 (3) MG/3ML nebulizer solution 3 mL, 3 mL, Nebulization, Q6H, , , MD, 3 mL at 08/09/19 1306 .  liver oil-zinc oxide (DESITIN) 40 % ointment, , Topical, BID, Ottie Glazier, MD, Given at 08/09/19 0437 .  MEDLINE mouth rinse, 15 mL, Mouth Rinse, 10 times per day, Flora Lipps, MD, 15 mL at 08/09/19 1735 .  metoprolol tartrate (LOPRESSOR) injection 2.5-5 mg, 2.5-5 mg, Intravenous, Q4H PRN, Tyler Pita, MD, 5 mg at 08/08/19 0436 .  multivitamin liquid 15 mL, 15 mL, Oral, Daily, Flora Lipps, MD, 15 mL at 08/09/19 0843 .  nitroGLYCERIN (NITROGLYN) 2 % ointment 0.5 inch, 0.5 inch, Topical, Q6H, Blakeney, Dana G, NP, 0.5 inch at 08/09/19 1639 .  nystatin (MYCOSTATIN) 100000 UNIT/ML suspension 500,000 Units, 5 mL, Oral, QID, Ottie Glazier, MD, 500,000 Units at 08/09/19 1639 .  ondansetron (ZOFRAN) injection 4 mg, 4 mg, Intravenous, Q6H PRN, Mortimer Fries, Kurian, MD .  potassium chloride 20 MEQ/15ML (10%) solution 40 mEq, 40 mEq, Oral, BID, Darel Hong D, NP, 40 mEq at 08/09/19 0842 .  sodium chloride flush (NS) 0.9 % injection 10-40 mL, 10-40 mL, Intracatheter, PRN, Flora Lipps, MD, 10 mL at 08/03/19 0929 .  [COMPLETED] thiamine 572m in normal saline (556m IVPB, 500 mg, Intravenous, Daily, Last Rate: 100 mL/hr at 08/05/19 1033, 500 mg at 08/05/19 1033 **FOLLOWED BY** thiamine tablet 100 mg, 100 mg, Per Tube, Daily, KaFlora LippsMD, 100 mg at 08/09/19 0843 .  vancomycin (VANCOREADY) IVPB 1250 mg/250 mL, 1,250 mg, Intravenous,  Q24H, AlOttie GlazierMD, Stopped at 08/09/19 1538    ALLERGIES   Patient has no allergy information on record.    REVIEW OF SYSTEMS    Unable to obtain due to severity of patient condition.  PHYSICAL EXAMINATION   Vital Signs: Temp:  [97.8 F (36.6 C)-99.8 F (37.7 C)] 99.8 F (37.7 C) (01/21 1600) Pulse Rate:  [76-112] 94 (01/21 1700) Resp:  [17-28] 24 (01/21 1700) BP: (116-156)/(45-90) 133/59 (01/21 1700) SpO2:  [89 %-97 %] 91 % (01/21 1700) FiO2 (%):  [30 %] 30 % (01/21 1600) Weight:  [107.7 kg] 107.7 kg (01/21 0400)  GENERAL:Patient is an ill-appearing obese man.  Intubated HEAD: Normocephalic, atraumatic.  EYES: Pupils equal, round, slowly reactive to light.  No scleral icterus.  MOUTH: Moist mucosal membrane. NECK: Supple. No thyromegaly. No nodules. No JVD.  PULMONARY: Wheezing and rhonchi present bilaterally diminished in bilateral lower lobes CARDIOVASCULAR: S1 and S2. Regular rate and rhythm. Distant heart sounds.  Systolic murmur at right upper sternal border consistent with aortic stenosis. GASTROINTESTINAL: Soft, nontender, non-distended. No masses. Positive bowel sounds. No hepatosplenomegaly.  MUSCULOSKELETAL: Edema  in bilateral fingers.   NEUROLOGIC: Obtunded.  Pupils slowly reactive. Corneal reflex sluggish.  Cough reflex intact.  Patient does not follow commands.  Makes non-purposeful movements of extremities.   SKIN:intact,warm,dry   PERTINENT DATA     Infusions: . sodium chloride Stopped (08/09/19 0845)  . cefTRIAXone (ROCEPHIN)  IV Stopped (08/09/19 1437)  . vancomycin Stopped (08/09/19 1538)   Scheduled Medications: . amLODipine  10 mg Per Tube Daily  . chlorhexidine gluconate (MEDLINE KIT)  15 mL Mouth Rinse BID  . Chlorhexidine Gluconate Cloth  6 each Topical Daily  . enoxaparin (LOVENOX) injection  40 mg Subcutaneous Q24H  . famotidine  20 mg Per Tube BID  . feeding supplement (PRO-STAT SUGAR FREE 64)  60 mL Per Tube BID  . feeding  supplement (VITAL HIGH PROTEIN)  1,000 mL Per Tube Q24H  . folic acid  1 mg Per Tube Daily  . free water  400 mL Per Tube Q4H  . insulin aspart  0-15 Units Subcutaneous Q4H  . ipratropium-albuterol  3 mL Nebulization Q6H  . liver oil-zinc oxide   Topical BID  . mouth rinse  15 mL Mouth Rinse 10 times per day  . multivitamin  15 mL Oral Daily  . nitroGLYCERIN  0.5 inch Topical Q6H  . nystatin  5 mL Oral QID  . potassium chloride  40 mEq Oral BID  . thiamine  100 mg Per Tube Daily   PRN Medications: sodium chloride, acetaminophen, hydrALAZINE, metoprolol tartrate, ondansetron (ZOFRAN) IV, sodium chloride flush Hemodynamic parameters:   Intake/Output: 01/20 0701 - 01/21 0700 In: 2541.1 [I.V.:45; QV/ZD:6387.5; IV Piggyback:509.5] Out: 2100 [Urine:2000; Stool:100]  Ventilator  Settings: Vent Mode: PRVC FiO2 (%):  [30 %] 30 % Set Rate:  [20 bmp] 20 bmp Vt Set:  [500 mL] 500 mL PEEP:  [5 cmH20] 5 cmH20 Plateau Pressure:  [14 cmH20-21 cmH20] 14 cmH20   LAB RESULTS:  Basic Metabolic Panel: Recent Labs  Lab 08/04/19 0345 08/04/19 0345 08/05/19 6433 08/05/19 2951 08/06/19 0732 08/06/19 0732 08/07/19 0359 08/07/19 0359 08/08/19 0927 08/09/19 0438  NA 146*   < > 147*  --  149*  --  152*  --  153* 154*  K 3.5   < > 4.7   < > 4.4   < > 3.6   < > 3.6 2.8*  CL 114*   < > 113*  --  115*  --  114*  --  115* 114*  CO2 25   < > 26  --  23  --  29  --  27 32  GLUCOSE 118*   < > 114*  --  147*  --  111*  --  140* 142*  BUN 51*   < > 56*  --  58*  --  54*  --  57* 60*  CREATININE 1.08   < > 1.08  --  1.10  --  1.01  --  1.16 0.97  CALCIUM 8.2*   < > 8.9  --  9.0  --  8.8*  --  8.6* 8.6*  MG 2.6*  --   --   --  2.6*  --  2.1  --  2.6*  --   PHOS 2.3*  --   --   --  3.3  --   --   --   --   --    < > = values in this interval not displayed.   Liver Function Tests: Recent Labs  Lab 08/08/19 [phone]  AST 87*  ALT 92*  ALKPHOS 55  BILITOT 0.8  PROT 6.6  ALBUMIN 2.9*   No results  for input(s): LIPASE, AMYLASE in the last 168 hours. Recent Labs  Lab 08/08/19 1234  AMMONIA 64*   CBC: Recent Labs  Lab 08/04/19 0345 08/05/19 1610 08/06/19 0732 08/07/19 0359 08/09/19 0438  WBC 10.5 10.6* 12.3* 11.5* 13.7*  NEUTROABS 6.8  --  9.8* 8.6*  --   HGB 13.1 13.6 14.0 14.1 12.5*  HCT 42.5 44.3 43.9 43.0 40.7  MCV 101.9* 101.6* 98.9 97.7 102.3*  PLT 249 282 257 282 242   Cardiac Enzymes: No results for input(s): CKTOTAL, CKMB, CKMBINDEX, TROPONINI in the last 168 hours. BNP: Invalid input(s): POCBNP CBG: Recent Labs  Lab 08/09/19 0031 08/09/19 0411 08/09/19 0730 08/09/19 1128 08/09/19 1511  GLUCAP 118* 125* 105* 110* 115*     IMAGING RESULTS:  Imaging: EEG  Result Date: 08/08/2019 Alexis Goodell, MD     08/08/2019  5:42 PM ELECTROENCEPHALOGRAM REPORT Patient: Magnus Crescenzo       Room #: [facility] EEG No. ID: [id number] Age: 74 y.o.        Sex: male Referring Physician: Lanney Gins Report Date:  08/08/2019       Interpreting Physician: Alexis Goodell History: Alanson Hausmann is an 74 y.o. male found down Medications: Norvasc, Insulin, Zosyn, Vancomycin, Thiamine Conditions of Recording:  This is a 21 channel routine scalp EEG performed with bipolar and monopolar montages arranged in accordance to the international 10/20 system of electrode placement. One channel was dedicated to EKG recording. The patient is in the intubated and unresponsive state. Description:  Artifact is prominent during the recording often obscuring the background rhythm. When able to be visualized the background is slow and poorly organized.  At times some poorly sustained alpha activity can be identified but the predominant rhythms are theta and delta rhythms.  This activity is diffusely distributed and continuous.  No epileptiform activity is noted.  The patient is stimulated during the recording with no activation of the background rhythm noted.  Hyperventilation and intermittent photic  stimulation were not performed. IMPRESSION: This is an abnormal EEG secondary to general background slowing.  This finding may be seen with a diffuse disturbance that is etiologically nonspecific, but may include a metabolic encephalopathy, among other possibilities.  No epileptiform activity is noted.  Alexis Goodell, MD Neurology [phone] 08/08/2019, 5:35 PM   DG Chest Port 1 View  Result Date: 08/08/2019 CLINICAL DATA:  PICC line placement EXAM: PORTABLE CHEST 1 VIEW COMPARISON:  08/08/2019, earlier same day FINDINGS: 1225 hours. Low volume film does not include the right lung base. The cardio pericardial silhouette is enlarged. Bibasilar atelectasis/infiltrate is similar to prior with small left pleural effusion less well demonstrated. New right PICC line tip projects in the region of the innominate vein confluence. Endotracheal tube tip is approximately 5.5 cm above the base of the carina. The NG tube passes into the stomach although the distal tip position is not included on the film. IMPRESSION: 1. New right PICC line tip projects in the region of the innominate vein confluence. 2. Otherwise stable exam. Electronically Signed   By: Misty Stanley M.D.   On: 08/08/2019 12:39   DG Chest Port 1 View  Result Date: 08/08/2019 CLINICAL DATA:  Respiratory failure. Pulmonary infiltrates. EXAM: PORTABLE CHEST 1 VIEW COMPARISON:  Radiograph yesterday. FINDINGS: Endotracheal tube tip at the thoracic inlet. Enteric tube tip below the diaphragm not included in the field of view.  Unchanged left lung base opacity likely combination of pleural fluid and airspace disease. Stable heart size and mediastinal contours. No pneumothorax. IMPRESSION: 1. Unchanged left lung base opacity likely combination of pleural fluid and airspace disease. 2. Stable support apparatus. Electronically Signed   By: Keith Rake M.D.   On: 08/08/2019 06:01        ASSESSMENT AND PLAN    -Multidisciplinary rounds held  today  Acute Hypoxic Respiratory Failure  -Due to acute decompensated systolic CHF as well as possible pneumonia since patient has been febrile with leukocytosis. -Has diuresed well now proximately 6 L net negative -continue Full MV support -continue Bronchodilator Therapy with budesonide and duoneb -Wean Fio2 and PEEP as tolerated -will perform SAT/SBT when respiratory parameters are met - stopping lasix today- due to signs of volume contraction now -Continue Zosyn   Altered mental status -patient is able to open eyes and is starting to visually track. -Discussed case with neurology-Dr. Reynolds-appreciate input -Myriad of metabolic causes have been evaluated and are thus far negative -Status post EEG -Status post MRI brain no acute findings -will perform ACTH stim test today_0 -will obtain EEG today -TSH is low, subclinical hyperthyroidism due to acutely ill state    CARDIAC FAILURE with grade I diastolic dysfunction -oxygen as needed -Lasix as tolerated -follow up cardiac enzymes as indicated - Continue topical nitroglycerin ICU monitoring  Hypertension - BP continues to stay in the 175/37 mmHg range. - amlodipine 10 mg daily - Metoprolol  Q4H prn for hypertension - Clonidine 0.1 mg BID   Alcoholism Monitoring for signs of withdrawal - History of alcohol abuse - Continue thiamine 314 mg daily, folic acid 1 mg daily - Modified CIWA protocol with hydralazine    Hypernatremia  -Continue free water flushes increased to 350 every 4 -Continue Lasix -Switch Zosyn due to high sodium load to Rocephin  NEUROLOGY - intubated and sedated with fentanyl 100 mcg - minimal sedation to achieve a RASS goal: -1 Wake up assessment pending - Patient remains obtunded after discontinuing sedation with sluggish pupils.  Is not following commands.  - Due to mental status, hypertension, and widened pulse pressure, consider MRI head.   GI/Nutrition GI PROPHYLAXIS as  indicated DIET-->Continue TF's as tolerated Patient continues to have loose bowel movements- rectal tube in place.  Hold stool softeners.     ENDO - ICU hypoglycemic\Hyperglycemia protocol -check FSBS per protocol - Continue insulin sliding scale  Intertrigo in groin - Begin application of hydrocerin cream BID. - Will continue nursing wound care and bedding changes to reduce the risk of further breakdown.   Goals of care - Patient DNR status  - Patient has been intubated for 9 days.  Plan family meeting in the next couple of days to discuss patient prognosis and goals of care including PEG and trach placement if no clinical improvement.   ELECTROLYTES -follow labs as needed -replace as needed  -pharmacy consultation   DVT/GI PRX ordered - Lovenox 40 mg QD for DVT prophylaxis TRANSFUSIONS AS NEEDED MONITOR FSBS ASSESS the need for LABS as needed   Critical care provider statement:    Critical care time (minutes):  33   Critical care time was exclusive of:  Separately billable procedures and treating other patients   Critical care was necessary to treat or prevent imminent or life-threatening deterioration of the following conditions:  Acute respiratory failure, Delerium tremens   Critical care was time spent personally by me on the following activities:  Development of treatment  plan with patient or surrogate, discussions with consultants, evaluation of patient's response to treatment, examination of patient, obtaining history from patient or surrogate, ordering and performing treatments and interventions, ordering and review of laboratory studies and re-evaluation of patient's condition.  I assumed direction of critical care for this patient from another provider in my specialty: no    Patient remains critically ill with multi-organ failure.  At risk of developing cardiac arrest and death. Patient currently DNR status.    Ottie Glazier, M.D.  Division of [place]

## 2019-08-09 NOTE — Consult Note (Signed)
PHARMACY CONSULT NOTE - FOLLOW UP  Pharmacy Consult for Electrolyte Monitoring and Replacement   Antonio [name] is a morbidly obese 74 y.o. male admitted on 07/27/2019. EMS stated pt walked to truck, collapsed on stretcher, and became unresponsive. Pt was apneic, but still with pulse. Pt has AKI, severe respiratory distress, and acute respiratory failure. Pt is intubated on mechanical ventilation. Per am rounds, furosemide 40mg  IV started today for fluid overload. Free water flush increased to 400 mL Q4H. Pt is not on sedation. Potassium 67mEq given this morning.   Recent Labs: Potassium (mmol/L)  Date Value  08/09/2019 2.8 (L)   Magnesium (mg/dL)  Date Value  08/08/2019 2.6 (H)   Calcium (mg/dL)  Date Value  08/09/2019 8.6 (L)   Albumin (g/dL)  Date Value  08/08/2019 2.9 (L)   Phosphorus (mg/dL)  Date Value  08/06/2019 3.3   Sodium (mmol/L)  Date Value  08/09/2019 154 (H)     Assessment: 1. Electrolytes: Sodium levels are higher than normal, trending upward despite the water flushes. Free water flush has been increased to 400 mL per tube Q4H starting 1215 today. Lasix 40mg  is also to be administered today for fluid overload. Potassium levels are low; received 40 mEq this morning and will receive another dose tonight 2200. Albumin levels are from yesterday and are low. Corrected calcium is 9.48 (WNL). Magnesium is higher than normal. Continue to monitor electrolytes in am lab. Will replace to achieve target potassium ~4 and magnesium ~2.   2. Glucose: Today's range: 105-142. Slight decrease since yesterday. Glucose-capillary monitored Q4H. Continue insulin aspart 0-15 units [place] Q4H and monitor glucose levels daily.    3. Constipation: Last BM date 01/21. Continue to monitor BM and add constipation-relief meds if constipation recurs.     Roanna Banning , Pharmacy Student Clinical Pharmacist 08/09/2019 11:43 AM

## 2019-08-09 NOTE — Progress Notes (Addendum)
Pharmacy Antibiotic Note  Antonio [name] is a morbidly obese 74 y.o. male admitted on 07/27/2019. EMS stated pt walked to truck, collapsed on stretcher, and became unresponsive. Pt was apneic, but still with pulse. Pt has AKI, severe respiratory distress, and acute respiratory failure. Pt is intubated on mechanical ventilation. Per am rounds, Zosyn was D/C'ed for its contribution to serum sodium increase. Ceftriaxone to replace Zosyn. Respiratory and blood cx are still pending. WBCs have increased since 01/19. Pt was febrile yesterday, but afebrile today. Pharmacy has been consulted for Ceftriaxone and Vancomycin dosing.  Plan: D/C Zosyn 3.375g IV Q8H, extended infusion  Initiate ceftriaxone 2g IV Q24H (starting today 1400) Continue vancomycin 1250mg  IV Q24H  Height: 5' 7.01" (170.2 cm) Weight: 237 lb 7 oz (107.7 kg) IBW/kg (Calculated) : 66.12  Temp (24hrs), Avg:99 F (37.2 C), Min:97.8 F (36.6 C), Max:100.6 F (38.1 C)  Recent Labs  Lab 08/04/19 0345 08/04/19 0345 08/05/19 [id number] 08/06/19 0732 08/07/19 0359 08/08/19 0927 08/09/19 0438  WBC 10.5  --  10.6* 12.3* 11.5*  --  13.7*  CREATININE 1.08   < > 1.08 1.10 1.01 1.16 0.97   < > = values in this interval not displayed.    Estimated Creatinine Clearance: 78.2 mL/min (by C-G formula based on SCr of 0.97 mg/dL).    Not on File  Antimicrobials this admission: Ciprofloxacin 400mg  IV 01/10 >> 01/11 Ceftriaxone 1g IV 01/11 >> 01/12 Unasyn 3g IV 01/13 >> 01/15 Zosyn 3.375g IV 01/20 >> 01/21 Vancomycin 2000mg  IV 01/20 >>  Ceftriaxone 2g IV 01/21 >>    Dose adjustments this admission: NA  Microbiology results: 01/20 BCx: No growth <24h, pending 01/20 BCx: No growth <24h, pending 01/08 BCx: No growth 5 days (finalized 01/13) 01/20 Respiratory Cx (tracheal aspirate): Rare WBC present, predominantly PMN, rare gram positive cocci in pairs, pending 01/11 Sputum: Consistent with normal respiratory flora (finalized 01/14) 01/09  UCx: No growth 01/08 UCx: No growth  01/20 MRSA PCR: Negative 01/09 MRSA PCR: Negative 01/08 SARS Coronavirus PCR: Negative 01/08 Influenza A/B: Negative   Thank you for allowing pharmacy to be a part of this patient's care.  Roanna Banning 08/09/2019 12:11 PM

## 2019-08-09 NOTE — Consult Note (Addendum)
Reason for Consult:AMS Referring Physician: Lanney Gins  CC: AMS  HPI: Antonio [name] is an 74 y.o. male who is intubated and altered and therefore unable to provide any history.  All history obtained from the chart.  Patient with a history of ETOH abuse and suspected COPD who presented on 1/8 after collapsing.  Patient walked out to truck, collapsed on stretcher and became unresponsive. Pt never lost pulse but became apneic. Patient was being bagged on EMS arrival.  Patient intubated.  Found to be in pulmonary edema with AKI, aspiration PNA and UTI.  Patient has remained intubated since that time.  Failed wean on 1/14 due to delirium.  Was felt to be in DT's at that time.  Went into afib/flutter on 1/17.  Has again failed attempts at weaning.  Slowly improving but not near baseline.  Last sedation 1/18.   Has been on Unasyn (1/13-15), Rocephin, non CNS doses, (1/11-19) and Cipro for 1-2 days.  Currently on Vancomycin and Zosyn since 1/20.  Despite has had low grade fevers.  Brain imaging shows no etiology for mental status.  EEG shows no epileptiform activity.   Lab work reveal hypernatremia, continued AKI with BUN of 60, elevated LFT's, elevated ammonia and elevated white blood cell count.  CXR shows evidence of LLL infiltrate.    Past Medical History:  Diagnosis Date  . ETOH abuse   . Smoker     Surgical history: Unable to obtain due to mental status  Family history: Unable to obtain due to mental status  Social History:  reports that he has been smoking cigarettes. He has a 27.50 pack-year smoking history. He has never used smokeless tobacco. He reports that he does not drink alcohol or use drugs.  Not on File  Medications:  I have reviewed the patient's current medications. Prior to Admission:  No medications prior to admission.   Scheduled: . amLODipine  10 mg Per Tube Daily  . chlorhexidine gluconate (MEDLINE KIT)  15 mL Mouth Rinse BID  . Chlorhexidine Gluconate Cloth  6 each  Topical Daily  . enoxaparin (LOVENOX) injection  40 mg Subcutaneous Q24H  . famotidine  20 mg Per Tube BID  . feeding supplement (PRO-STAT SUGAR FREE 64)  60 mL Per Tube BID  . feeding supplement (VITAL HIGH PROTEIN)  1,000 mL Per Tube Q24H  . folic acid  1 mg Per Tube Daily  . free water  400 mL Per Tube Q4H  . insulin aspart  0-15 Units Subcutaneous Q4H  . ipratropium-albuterol  3 mL Nebulization Q6H  . liver oil-zinc oxide   Topical BID  . mouth rinse  15 mL Mouth Rinse 10 times per day  . multivitamin  15 mL Oral Daily  . nitroGLYCERIN  0.5 inch Topical Q6H  . potassium chloride  40 mEq Oral BID  . thiamine  100 mg Per Tube Daily    ROS: Unable to obtain due to mental status  Physical Examination: Blood pressure 137/90, pulse 97, temperature 98.5 F (36.9 C), temperature source Axillary, resp. rate (!) 22, height 5' 7.01" (1.702 m), weight 107.7 kg, SpO2 96 %.  HEENT-  Normocephalic, no lesions, without obvious abnormality.  Normal external eye and conjunctiva.  Normal TM's bilaterally.  Normal auditory canals and external ears. Normal external nose, mucus membranes and septum.  Normal pharynx. Cardiovascular- S1, S2 normal, pulses palpable throughout   Lungs- chest clear, no wheezing, rales, normal symmetric air entry Abdomen- soft, non-tender; bowel sounds normal; no masses,  no  organomegaly Extremities- edematous throughout Lymph-no adenopathy palpable Musculoskeletal-no joint tenderness, deformity or swelling Skin-warm and dry, no hyperpigmentation, vitiligo, or suspicious lesions  Neurological Examination   Mental Status: Patient does not respond to verbal stimuli.  Grimaces to deep sternal rub.  Does not follow commands.  No verbalizations noted.  Initially with eyes closed but as he is continued to be stimulated, opens eyes and tracks Cranial Nerves: II: patient does not respond confrontation bilaterally, pupils right 3 mm, left 3 mm,and reactive  bilaterally III,IV,VI: Oculocephalic response present bilaterally.  V,VII: corneal reflex present bilaterally  VIII: patient does not respond to verbal stimuli IX,X: gag reflex unable to be tested, XI: trapezius strength unable to test bilaterally XII: tongue strength unable to test Motor: Movement noted in the lower extremities with stimulation.  Less movement noted in the upper extremities Sensory: Respond to noxious stimuli in all extremities. Deep Tendon Reflexes:  Symmetric throughout. Plantars: equivocal bilaterally Cerebellar: Unable to perform  Laboratory Studies:   Basic Metabolic Panel: Recent Labs  Lab 08/04/19 0345 08/04/19 0345 08/05/19 0258 08/05/19 5277 08/06/19 0732 08/06/19 0732 08/07/19 0359 08/08/19 0927 08/09/19 0438  NA 146*   < > 147*  --  149*  --  152* 153* 154*  K 3.5   < > 4.7  --  4.4  --  3.6 3.6 2.8*  CL 114*   < > 113*  --  115*  --  114* 115* 114*  CO2 25   < > 26  --  23  --  29 27 32  GLUCOSE 118*   < > 114*  --  147*  --  111* 140* 142*  BUN 51*   < > 56*  --  58*  --  54* 57* 60*  CREATININE 1.08   < > 1.08  --  1.10  --  1.01 1.16 0.97  CALCIUM 8.2*   < > 8.9   < > 9.0   < > 8.8* 8.6* 8.6*  MG 2.6*  --   --   --  2.6*  --  2.1 2.6*  --   PHOS 2.3*  --   --   --  3.3  --   --   --   --    < > = values in this interval not displayed.    Liver Function Tests: Recent Labs  Lab 08/08/19 0927  AST 87*  ALT 92*  ALKPHOS 55  BILITOT 0.8  PROT 6.6  ALBUMIN 2.9*   No results for input(s): LIPASE, AMYLASE in the last 168 hours. Recent Labs  Lab 08/08/19 1234  AMMONIA 64*    CBC: Recent Labs  Lab 08/04/19 0345 08/05/19 8242 08/06/19 0732 08/07/19 0359 08/09/19 0438  WBC 10.5 10.6* 12.3* 11.5* 13.7*  NEUTROABS 6.8  --  9.8* 8.6*  --   HGB 13.1 13.6 14.0 14.1 12.5*  HCT 42.5 44.3 43.9 43.0 40.7  MCV 101.9* 101.6* 98.9 97.7 102.3*  PLT 249 282 257 282 242    Cardiac Enzymes: No results for input(s): CKTOTAL, CKMB,  CKMBINDEX, TROPONINI in the last 168 hours.  BNP: Invalid input(s): POCBNP  CBG: Recent Labs  Lab 08/08/19 1935 08/09/19 0031 08/09/19 0411 08/09/19 0730 08/09/19 1128  GLUCAP 120* 118* 125* 105* 110*    Microbiology: Results for orders placed or performed during the hospital encounter of 07/27/19  Blood Culture (routine x 2)     Status: None   Collection Time: 07/27/19 10:45 PM   Specimen: BLOOD  Result Value Ref Range Status   Specimen Description BLOOD Blood Culture adequate volume  Final   Special Requests   Final    BOTTLES DRAWN AEROBIC AND ANAEROBIC LEFT ANTECUBITAL   Culture   Final    NO GROWTH 5 DAYS Performed at [facility], [address] [place], [place] [address]    Report Status 08/01/2019 FINAL  Final  Respiratory Panel by RT PCR (Flu A&B, Covid) - Nasopharyngeal Swab     Status: None   Collection Time: 07/27/19 10:45 PM   Specimen: Nasopharyngeal Swab  Result Value Ref Range Status   SARS Coronavirus 2 by RT PCR NEGATIVE NEGATIVE Final    Comment: (NOTE) SARS-CoV-2 target nucleic acids are NOT DETECTED. The SARS-CoV-2 RNA is generally detectable in upper respiratoy specimens during the acute phase of infection. The lowest concentration of SARS-CoV-2 viral copies this assay can detect is 131 copies/mL. A negative result does not preclude SARS-Cov-2 infection and should not be used as the sole basis for treatment or other patient management decisions. A negative result may occur with  improper specimen collection/handling, submission of specimen other than nasopharyngeal swab, presence of viral mutation(s) within the areas targeted by this assay, and inadequate number of viral copies (<131 copies/mL). A negative result must be combined with clinical observations, patient history, and epidemiological information. The expected result is Negative. Fact Sheet for Patients:  PinkCheek.be Fact Sheet for Healthcare  Providers:  GravelBags.it This test is not yet ap proved or cleared by the [place] FDA and  has been authorized for detection and/or diagnosis of SARS-CoV-2 by FDA under an Emergency Use Authorization (EUA). This EUA will remain  in effect (meaning this test can be used) for the duration of the COVID-19 declaration under Section 564(b)(1) of the Act, 21 U.S.C. section 360bbb-3(b)(1), unless the authorization is terminated or revoked sooner.    Influenza A by PCR NEGATIVE NEGATIVE Final   Influenza B by PCR NEGATIVE NEGATIVE Final    Comment: (NOTE) The Xpert Xpress SARS-CoV-2/FLU/RSV assay is intended as an aid in  the diagnosis of influenza from Nasopharyngeal swab specimens and  should not be used as a sole basis for treatment. Nasal washings and  aspirates are unacceptable for Xpert Xpress SARS-CoV-2/FLU/RSV  testing. Fact Sheet for Patients: PinkCheek.be Fact Sheet for Healthcare Providers: GravelBags.it This test is not yet approved or cleared by the [place] FDA and  has been authorized for detection and/or diagnosis of SARS-CoV-2 by  FDA under an Emergency Use Authorization (EUA). This EUA will remain  in effect (meaning this test can be used) for the duration of the  Covid-19 declaration under Section 564(b)(1) of the Act, 21  U.S.C. section 360bbb-3(b)(1), unless the authorization is  terminated or revoked. Performed at [facility], [address] [place], [place] [address]   Urine Culture     Status: None   Collection Time: 07/27/19 10:45 PM   Specimen: Urine, Random  Result Value Ref Range Status   Specimen Description   Final    URINE, RANDOM Performed at [facility], [address] [place], [place] [address]    Special Requests   Final    NONE Performed at [facility], [address] [place], [place] [address]    Culture   Final     NO GROWTH Performed at [facility], [place] [address], [place], [place] [address]    Report Status 07/29/2019 FINAL  Final  MRSA PCR Screening  Status: None   Collection Time: 07/28/19 11:07 AM   Specimen: Nasal Mucosa; Nasopharyngeal  Result Value Ref Range Status   MRSA by PCR NEGATIVE NEGATIVE Final    Comment:        The GeneXpert MRSA Assay (FDA approved for NASAL specimens only), is one component of a comprehensive MRSA colonization surveillance program. It is not intended to diagnose MRSA infection nor to guide or monitor treatment for MRSA infections. Performed at [facility], [address], [place], [place] [address]   Urine Culture     Status: None   Collection Time: 07/28/19  5:15 PM   Specimen: Urine, Random  Result Value Ref Range Status   Specimen Description   Final    URINE, RANDOM Performed at [facility], [address], [place], [place] [address]    Special Requests   Final    NONE Performed at [facility], [address], [place], [place] [address]    Culture   Final    NO GROWTH Performed at [facility], [place] [address], [place], [place] [address]    Report Status 07/30/2019 FINAL  Final  Culture, respiratory     Status: None   Collection Time: 07/30/19 11:50 AM   Specimen: SPU  Result Value Ref Range Status   Specimen Description   Final    SPUTUM Performed at [facility], [address] [place], [place] [address]    Special Requests   Final    NONE Performed at [facility], [place], [place] [address]    Gram Stain   Final    ABUNDANT WBC PRESENT,BOTH PMN AND MONONUCLEAR RARE GRAM POSITIVE COCCI RARE GRAM VARIABLE ROD    Culture   Final    RARE Consistent with normal respiratory flora. Performed at [facility], [place] [address], [place], [place] [address]    Report Status 08/02/2019 FINAL  Final  CULTURE, BLOOD (ROUTINE X 2) w Reflex to ID  Panel     Status: None (Preliminary result)   Collection Time: 08/08/19  9:27 AM   Specimen: BLOOD  Result Value Ref Range Status   Specimen Description BLOOD RIGHT FOOT  Final   Special Requests   Final    BOTTLES DRAWN AEROBIC AND ANAEROBIC Blood Culture adequate volume   Culture   Final    NO GROWTH < 24 HOURS Performed at [facility], [address], [place], [place] [address]    Report Status PENDING  Incomplete  CULTURE, BLOOD (ROUTINE X 2) w Reflex to ID Panel     Status: None (Preliminary result)   Collection Time: 08/08/19  9:27 AM   Specimen: BLOOD  Result Value Ref Range Status   Specimen Description BLOOD LEFT FOOT  Final   Special Requests   Final    BOTTLES DRAWN AEROBIC AND ANAEROBIC Blood Culture results may not be optimal due to an excessive volume of blood received in culture bottles   Culture   Final    NO GROWTH < 24 HOURS Performed at [facility], [address] [place], [place] [address]    Report Status PENDING  Incomplete  MRSA PCR Screening     Status: None   Collection Time: 08/08/19  9:43 AM   Specimen: Nasopharyngeal  Result Value Ref Range Status   MRSA by PCR NEGATIVE NEGATIVE Final    Comment:        The GeneXpert MRSA Assay (  FDA approved for NASAL specimens only), is one component of a comprehensive MRSA colonization surveillance program. It is not intended to diagnose MRSA infection nor to guide or monitor treatment for MRSA infections. Performed at [facility], [address] [place], [place] [address]   Culture, respiratory (non-expectorated)     Status: None (Preliminary result)   Collection Time: 08/08/19  5:01 PM   Specimen: Tracheal Aspirate; Respiratory  Result Value Ref Range Status   Specimen Description   Final    TRACHEAL ASPIRATE Performed at [facility], [address], [place], [place] [address]    Special Requests   Final    NONE Performed at [facility], [address] [place], [place] [address]    Gram Stain   Final    RARE WBC PRESENT, PREDOMINANTLY PMN RARE GRAM POSITIVE COCCI IN PAIRS Performed at [facility], [place] [address] [place], [place] [address]    Culture PENDING  Incomplete   Report Status PENDING  Incomplete    Coagulation Studies: Recent Labs    08/08/19 1234  LABPROT 17.4*  INR 1.4*    Urinalysis:  Recent Labs  Lab 08/08/19 0943  COLORURINE YELLOW*  LABSPEC 1.020  PHURINE 5.0  GLUCOSEU NEGATIVE  HGBUR NEGATIVE  BILIRUBINUR NEGATIVE  KETONESUR NEGATIVE  PROTEINUR NEGATIVE  NITRITE NEGATIVE  LEUKOCYTESUR NEGATIVE    Lipid Panel:  No results found for: CHOL, TRIG, HDL, CHOLHDL, VLDL, LDLCALC  HgbA1C:  Lab Results  Component Value Date   HGBA1C 4.9 08/01/2019    Urine Drug Screen:  No results found for: LABOPIA, COCAINSCRNUR, LABBENZ, AMPHETMU, THCU, LABBARB  Alcohol Level: No results for input(s): ETH in the last 168 hours.  Other results: EKG: (07/27/19) sinus tachycardia at 117 bpm.  Imaging: EEG  Result Date: 08/08/2019 Alexis Goodell, MD     08/08/2019  5:42 PM ELECTROENCEPHALOGRAM REPORT Patient: Kirklin Mcduffee       Room #: [facility] EEG No. ID: [id number] Age: 74 y.o.        Sex: male Referring Physician: Lanney Gins Report Date:  08/08/2019       Interpreting Physician: Alexis Goodell History: Arden Tinoco is an 74 y.o. male found down Medications: Norvasc, Insulin, Zosyn, Vancomycin, Thiamine Conditions of Recording:  This is a 21 channel routine scalp EEG performed with bipolar and monopolar montages arranged in accordance to the international 10/20 system of electrode placement. One channel was dedicated to EKG recording. The patient is in the intubated and unresponsive state. Description:  Artifact is prominent during the recording often obscuring the background rhythm. When able to be visualized the background is slow and poorly organized.  At times some poorly sustained alpha activity  can be identified but the predominant rhythms are theta and delta rhythms.  This activity is diffusely distributed and continuous.  No epileptiform activity is noted.  The patient is stimulated during the recording with no activation of the background rhythm noted.  Hyperventilation and intermittent photic stimulation were not performed. IMPRESSION: This is an abnormal EEG secondary to general background slowing.  This finding may be seen with a diffuse disturbance that is etiologically nonspecific, but may include a metabolic encephalopathy, among other possibilities.  No epileptiform activity is noted.  Alexis Goodell, MD Neurology [phone] 08/08/2019, 5:35 PM   MR BRAIN W WO CONTRAST  Result Date: 08/07/2019 CLINICAL DATA:  Found down EXAM: MRI HEAD WITHOUT AND WITH CONTRAST TECHNIQUE: Multiplanar, multiecho pulse sequences of the brain and surrounding structures were  obtained without and with intravenous contrast. CONTRAST:  25m GADAVIST GADOBUTROL 1 MMOL/ML IV SOLN COMPARISON:  None. FINDINGS: Motion artifact is present. Brain: There is no acute infarction or intracranial hemorrhage. There is a 1.2 x 0.5 cm enhancing lesion along the left aspect of the falx with mild susceptibility is compatible with a small meningioma. There is no mass effect, hydrocephalus, or extra-axial collection. Minimal patchy T2 hyperintensity in the supratentorial white is nonspecific but may reflect minor chronic microvascular ischemic changes. There is left temporal encephalomalacia/gliosis. Vascular: Major vessel flow voids at the skull base are preserved. Skull and upper cervical spine: Normal marrow signal. Sinuses/Orbits: Paranasal sinus mucosal thickening, with greatest involvement of the maxillary and left sphenoid sinuses. Other: Patchy mastoid fluid opacification.  Sella is unremarkable. IMPRESSION: No evidence of recent infarction or intracranial hemorrhage. 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 Electronically Signed   By: PMacy MisM.D.   On: 08/07/2019 14:57   DG Chest Port 1 View  Result Date: 08/08/2019 CLINICAL DATA:  PICC line placement EXAM: PORTABLE CHEST 1 VIEW COMPARISON:  08/08/2019, earlier same day FINDINGS: 1225 hours. Low volume film does not include the right lung base. The cardio pericardial silhouette is enlarged. Bibasilar atelectasis/infiltrate is similar to prior with small left pleural effusion less well demonstrated. New right PICC line tip projects in the region of the innominate vein confluence. Endotracheal tube tip is approximately 5.5 cm above the base of the carina. The NG tube passes into the stomach although the distal tip position is not included on the film. IMPRESSION: 1. New right PICC line tip projects in the region of the innominate vein confluence. 2. Otherwise stable exam. Electronically Signed   By: EMisty StanleyM.D.   On: 08/08/2019 12:39   DG Chest Port 1 View  Result Date: 08/08/2019 CLINICAL DATA:  Respiratory failure. Pulmonary infiltrates. EXAM: PORTABLE CHEST 1 VIEW COMPARISON:  Radiograph yesterday. FINDINGS: Endotracheal tube tip at the thoracic inlet. Enteric tube tip below the diaphragm not included in the field of view. Unchanged left lung base opacity likely combination of pleural fluid and airspace disease. Stable heart size and mediastinal contours. No pneumothorax. IMPRESSION: 1. Unchanged left lung base opacity likely combination of pleural fluid and airspace disease. 2. Stable support apparatus. Electronically Signed   By: MKeith RakeM.D.   On: 08/08/2019 06:01     Assessment/Plan: 74y.o. male who is intubated and altered and therefore unable to provide any history.  All history obtained from the chart.  Patient with a history of ETOH abuse and suspected COPD who presented on 1/8 after collapsing.  Patient walked out to truck, collapsed on stretcher and became unresponsive. Pt never lost pulse but became  apneic. Patient was being bagged on EMS arrival.  Patient intubated.  Found to be in pulmonary edema with AKI, aspiration PNA and UTI.  Patient has remained intubated since that time.  Failed wean on 1/14 due to delirium.  Was felt to be in DT's at that time.  Went into afib/flutter on 1/17.  Has again failed further attempts at weaning.  Slowly improving but not near baseline.  Last sedation 1/18.   Has been on Unasyn (1/13-15), Rocephin, non CNS doses, (1/11-19) and Cipro for 1-2 days.  Currently on Vancomycin and Zosyn since 1/20.  Despite has had low grade fevers.  MRI of the brain reviewed and shows an incidental small meningioma and left temporal encephalomalcia but no etiology for mental status.  EEG shows  slowing with no epileptiform activity.   Lab work reveal hypernatremia, continued AKI with BUN of 60, elevated LFT's, elevated ammonia and elevated white blood cell count.  CXR shows evidence of LLL infiltrate. Altered mental status likely multifactorial and related to multiorgan injury (renal and hepatic, both worsening), metabolic abnormalities including hypernatremia and hyperammonemia and infection as evidenced by abnormal CXR, elevated white blood cell count and fevers.   Although patient has been off sedation since 1/18 was on phenobarbital as well.  Due to end organ damage may be clearing these substances slower than expected.   With encephalomalacia, seizure is on the differential as well but EEG shows no evidence.  Continuous monitoring not available.   Although patient was felt to be experiencing DT's earlier in the hospitalization, he is currently outside of that window at this time, therefore do not feel this is causing his issues Possibility of CNS infection has been raised.  Patient has been on multiple antibiotics although some not at CNS dosage therefore there is a possibility of an inadequately treated meningitis.  With patient improving this is less of a possibility but if  infiltrate on CXR not felt to be source of fever and elevated wbc count, can look into further with LP.  Patient currently with elevated coags and on Lovenox therefore would not perform this procedure at bedside.    Recommendations: 1.  If infiltrate on CXR not felt to be source of fever and elevated wbc count, would recommend LP.  Lovenox will need to be held for at least 24 hours and coags rechecked.  Will likely benefit from LP under fluoro. 2. Agree with continuing to address multiple metabolic issues.       Alexis Goodell, MD Neurology [phone] 08/09/2019, 12:06 PM

## 2019-08-10 ENCOUNTER — Inpatient Hospital Stay: Payer: Medicare Other

## 2019-08-10 DIAGNOSIS — G9341 Metabolic encephalopathy: Secondary | ICD-10-CM

## 2019-08-10 LAB — CBC
HCT: 38.4 % — ABNORMAL LOW (ref 39.0–52.0)
Hemoglobin: 11.7 g/dL — ABNORMAL LOW (ref 13.0–17.0)
MCH: 31.4 pg (ref 26.0–34.0)
MCHC: 30.5 g/dL (ref 30.0–36.0)
MCV: 102.9 fL — ABNORMAL HIGH (ref 80.0–100.0)
Platelets: 254 10*3/uL (ref 150–400)
RBC: 3.73 MIL/uL — ABNORMAL LOW (ref 4.22–5.81)
RDW: 13.9 % (ref 11.5–15.5)
WBC: 10.4 10*3/uL (ref 4.0–10.5)
nRBC: 0 % (ref 0.0–0.2)

## 2019-08-10 LAB — BASIC METABOLIC PANEL
Anion gap: 8 (ref 5–15)
Anion gap: 8 (ref 5–15)
BUN: 51 mg/dL — ABNORMAL HIGH (ref 8–23)
BUN: 51 mg/dL — ABNORMAL HIGH (ref 8–23)
CO2: 31 mmol/L (ref 22–32)
CO2: 29 mmol/L (ref 22–32)
Calcium: 8.1 mg/dL — ABNORMAL LOW (ref 8.9–10.3)
Calcium: 8.2 mg/dL — ABNORMAL LOW (ref 8.9–10.3)
Chloride: 115 mmol/L — ABNORMAL HIGH (ref 98–111)
Chloride: 113 mmol/L — ABNORMAL HIGH (ref 98–111)
Creatinine, Ser: 0.95 mg/dL (ref 0.61–1.24)
Creatinine, Ser: 0.96 mg/dL (ref 0.61–1.24)
GFR calc Af Amer: >60 mL/min (ref >60)
GFR calc Af Amer: >60 mL/min (ref >60)
GFR calc non Af Amer: >60 mL/min (ref >60)
GFR calc non Af Amer: >60 mL/min (ref >60)
Glucose, Bld: 120 mg/dL — ABNORMAL HIGH (ref 70–99)
Glucose, Bld: 137 mg/dL — ABNORMAL HIGH (ref 70–99)
Potassium: 3 mmol/L — ABNORMAL LOW (ref 3.5–5.1)
Potassium: 3.6 mmol/L (ref 3.5–5.1)
Sodium: 154 mmol/L — ABNORMAL HIGH (ref 135–145)
Sodium: 150 mmol/L — ABNORMAL HIGH (ref 135–145)

## 2019-08-10 LAB — GLUCOSE, CAPILLARY
Glucose-Capillary: 105 mg/dL — ABNORMAL HIGH (ref 70–99)
Glucose-Capillary: 94 mg/dL (ref 70–99)
Glucose-Capillary: 97 mg/dL (ref 70–99)
Glucose-Capillary: 105 mg/dL — ABNORMAL HIGH (ref 70–99)
Glucose-Capillary: 108 mg/dL — ABNORMAL HIGH (ref 70–99)

## 2019-08-10 LAB — ANA W/REFLEX: Anti Nuclear Antibody (ANA): NEGATIVE

## 2019-08-10 LAB — PROCALCITONIN: Procalcitonin: 0.25 ng/mL

## 2019-08-10 IMAGING — DX DG CHEST 1V PORT
1 series · 1 of 1 positions shown · non-contrast
Comparison: [DATE]

CLINICAL DATA: Shortness of breath

EXAM:
PORTABLE CHEST 1 VIEW

[chest ap]
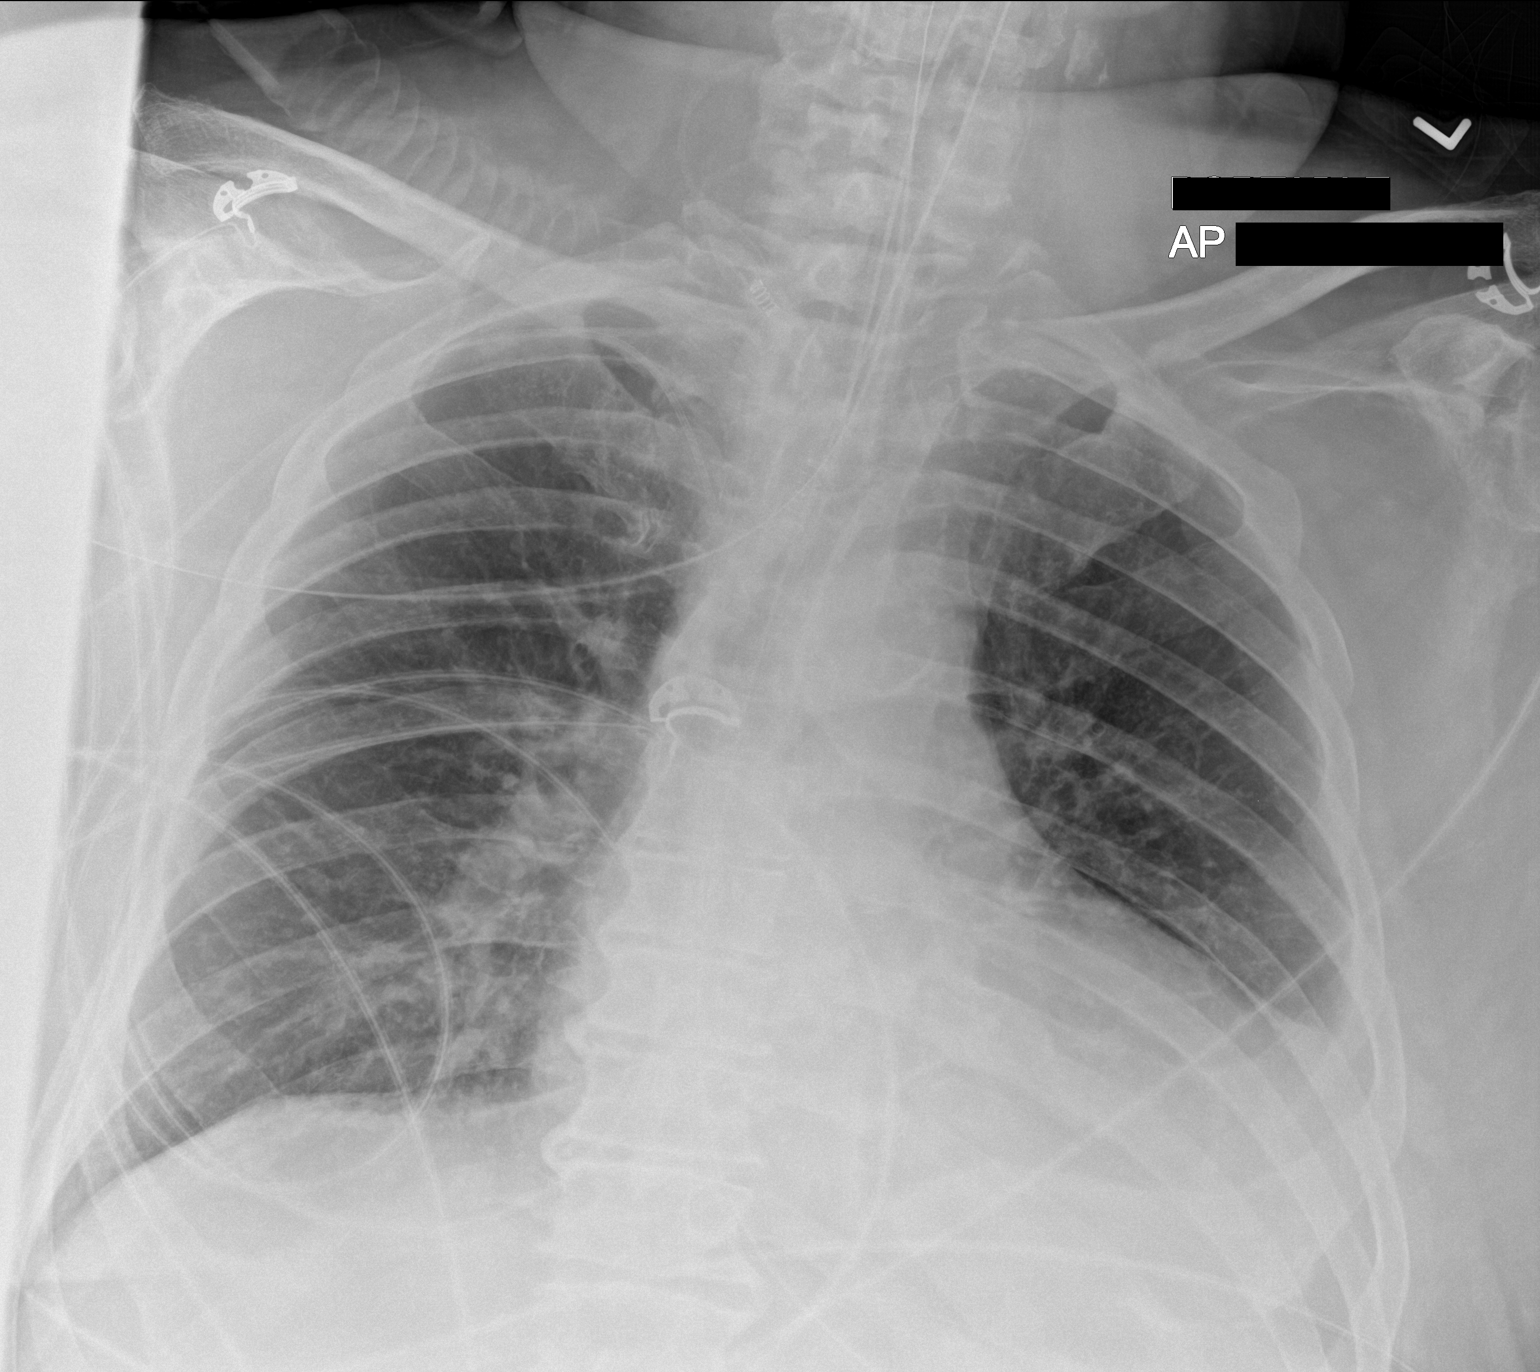

[1 of 1 positions shown; findings below may reference images not displayed]

FINDINGS: The right-sided PICC line tip terminates over the right
brachiocephalic vein. The endotracheal tube terminates above the
carina. The enteric tube extends below the left hemidiaphragm. There
is no pneumothorax. There may be a small left-sided pleural
effusion. Bibasilar atelectasis is suspected.
IMPRESSION: 1. The right-sided PICC line appears to have retracted slightly with
the tip now residing over the brachiocephalic vein.
2. The remaining lines and tubes are as above.
3. Otherwise, no significant interval change.

## 2019-08-10 MED ORDER — SODIUM CHLORIDE 0.9 % IV SOLN
2.0000 g | Freq: Two times a day (BID) | INTRAVENOUS | Status: DC
  Administered 2019-08-10 – 2019-08-13 (×7): 2 g via INTRAVENOUS
  Filled 2019-08-10: qty 2
  Filled 2019-08-10: qty 20
  Filled 2019-08-10 (×2): qty 2
  Filled 2019-08-10: qty 20
  Filled 2019-08-10: qty 2
  Filled 2019-08-10: qty 20
  Filled 2019-08-10: qty 2

## 2019-08-10 MED ORDER — POTASSIUM CHLORIDE 20 MEQ/15ML (10%) PO SOLN
40.0000 meq | Freq: Two times a day (BID) | ORAL | Status: AC
  Administered 2019-08-10 (×2): 40 meq
  Filled 2019-08-10 (×2): qty 30

## 2019-08-10 MED ORDER — FENTANYL CITRATE (PF) 100 MCG/2ML IJ SOLN: 50.0000 ug | Freq: Once | INTRAMUSCULAR | Status: AC

## 2019-08-10 MED ORDER — FENTANYL CITRATE (PF) 100 MCG/2ML IJ SOLN
INTRAMUSCULAR | Status: AC
  Administered 2019-08-10: 50 ug via INTRAVENOUS
  Filled 2019-08-10: qty 2

## 2019-08-10 MED ORDER — SODIUM CHLORIDE 0.9 % IV SOLN
20.0000 ug | Freq: Once | INTRAVENOUS | Status: AC
  Administered 2019-08-10: 20 ug via INTRAVENOUS
  Filled 2019-08-10: qty 5

## 2019-08-10 NOTE — Progress Notes (Signed)
RT assisted with bedside bronchoscopy to obtain BAL from RML for analysis. Patient tolerated well with no complications.

## 2019-08-10 NOTE — Progress Notes (Signed)
CRITICAL CARE PROGRESS NOTE    Name: Antonio [name] MRN: [id number] DOB: July 08, 1946     LOS: 91   SUBJECTIVE FINDINGS & SIGNIFICANT EVENTS   Patient description: Antonio [name] is a 74 yo male with a history of alcohol abuse, tobacco abuse and suspected COPD who presented to the ED on 07/27/2019 as a Antonio [name] after being found down by EMS.  He was admitted to the ICU in acute respiratory distress, intubated and sedated.  On further workup, he was found to have a grade I diastolic dysfunction, moderate aortic stenosis and AKI.     Lines / Drains: Midline single lumen left cephalic PIV right forearm NG/OG tube Urethral catheter Rectal tube ET tube x 9 days  Cultures / Sepsis markers: Respiratory culture 1/11 consistent with normal respiratory flora.  Negative for MRSA, Influenza A/B, Sars-cov-2.  Blood cultures 1/8 showed no growth at 5 days.    Patient remains afebrile at 98.3F.     Protocols / Consultants: CIWA protocol  Tests / Events: 1/9 severe resp failure acute sCHF, intubated 1/8 1/10 SAT/SBT pending, +UTI continue ABX 1/11 patient failed wean due to agitation and increased work of breathing 1/12 patient failed weaning trial 1/13 bradycardia at 48 bpm. No elevation in troponin. Family discussion-patient is DNR  1/14 failed SAT/SBT due to severe delirium and WOB 1/16 Slow to awaken but no asynchrony with sedation vacation 1/17 developed A. fib flutter with controlled ventricular response 1/18 patient remains in anasarca - now on lasix gtt 1/19 MRI brain with no acute CVA but + right temporal encephalomalacia and a meningioma found incidentally 1/20- patient continues to have altered mentation and fever this am. Repeating septic workup.  EEG today. MRI brain yesterday no acute findings. Neuro  consultation 1/21-patient mildly improved in terms of mentation, discussed case with neurology today.  Possible LP 1/22- patient is improving wbc count normalized, mentation improved.  Discussed with Dr Doy Mince. Hypernatremia is refractory to 350W push q4h via OGT, will do one time DDAVP challenge with repeat BMP    PAST MEDICAL HISTORY   Past Medical History:  Diagnosis Date  . ETOH abuse   . Smoker      SURGICAL HISTORY   History reviewed. No pertinent surgical history.   FAMILY HISTORY   History reviewed. No pertinent family history.   SOCIAL HISTORY   Social History   Tobacco Use  . Smoking status: Current Every Day Smoker    Packs/day: 0.50    Years: 55.00    Pack years: 27.50    Types: Cigarettes  . Smokeless tobacco: Never Used  Substance Use Topics  . Alcohol use: Never  . Drug use: Never     MEDICATIONS   Current Medication:  Current Facility-Administered Medications:  .  0.9 %  sodium chloride infusion, 250 mL, Intravenous, PRN, Flora Lipps, MD, Stopped at 08/09/19 0845 .  acetaminophen (TYLENOL) tablet 650 mg, 650 mg, Oral, Q4H PRN, Flora Lipps, MD, 650 mg at 08/08/19 2044 .  amLODipine (NORVASC) tablet 10 mg, 10 mg, Per Tube, Daily, Awilda Bill, NP, 10 mg at 08/09/19 0842 .  cefTRIAXone (ROCEPHIN) 2 g in sodium chloride 0.9 % 100 mL IVPB, 2 g, Intravenous, Q24H, Ottie Glazier, MD, Stopped at 08/09/19 1437 .  chlorhexidine gluconate (MEDLINE KIT) (PERIDEX) 0.12 % solution 15 mL, 15 mL, Mouth Rinse, BID, Kasa, Kurian, MD, 15 mL at 08/10/19 0752 .  Chlorhexidine Gluconate Cloth 2 % PADS 6 each, 6 each, Topical, Daily, Kasa, Kurian,  MD, 6 each at 08/09/19 2156 .  enoxaparin (LOVENOX) injection 40 mg, 40 mg, Subcutaneous, Q24H, Hallaji, Sheema M, RPH, 40 mg at 08/09/19 0842 .  famotidine (PEPCID) tablet 20 mg, 20 mg, Per Tube, BID, Flora Lipps, MD, 20 mg at 08/09/19 2154 .  feeding supplement (PRO-STAT SUGAR FREE 64) liquid 60 mL, 60 mL, Per  Tube, BID, Conley Pawling, MD, 60 mL at 08/09/19 2154 .  feeding supplement (VITAL HIGH PROTEIN) liquid 1,000 mL, 1,000 mL, Per Tube, Q24H, Savion Washam, MD, 1,000 mL at 08/09/19 1640 .  folic acid (FOLVITE) tablet 1 mg, 1 mg, Per Tube, Daily, Flora Lipps, MD, 1 mg at 08/09/19 0842 .  free water 400 mL, 400 mL, Per Tube, Q4H, Charlene Cowdrey, MD, 400 mL at 08/10/19 0754 .  hydrALAZINE (APRESOLINE) injection 20 mg, 20 mg, Intravenous, Q4H PRN, Tyler Pita, MD, 20 mg at 08/08/19 0304 .  insulin aspart (novoLOG) injection 0-15 Units, 0-15 Units, Subcutaneous, Q4H, Charlett Nose, RPH, 1 Units at 08/09/19 2337 .  ipratropium-albuterol (DUONEB) 0.5-2.5 (3) MG/3ML nebulizer solution 3 mL, 3 mL, Nebulization, Q6H, Lanney Gins, Thomasine Klutts, MD, 3 mL at 08/10/19 0808 .  liver oil-zinc oxide (DESITIN) 40 % ointment, , Topical, BID, Ottie Glazier, MD, Given at 08/09/19 2156 .  MEDLINE mouth rinse, 15 mL, Mouth Rinse, 10 times per day, Flora Lipps, MD, 15 mL at 08/10/19 0541 .  metoprolol tartrate (LOPRESSOR) injection 2.5-5 mg, 2.5-5 mg, Intravenous, Q4H PRN, Tyler Pita, MD, 5 mg at 08/08/19 0436 .  multivitamin liquid 15 mL, 15 mL, Oral, Daily, Flora Lipps, MD, 15 mL at 08/09/19 0843 .  nitroGLYCERIN (NITROGLYN) 2 % ointment 0.5 inch, 0.5 inch, Topical, Q6H, Blakeney, Dreama Saa, NP, 0.5 inch at 08/10/19 0541 .  nystatin (MYCOSTATIN) 100000 UNIT/ML suspension 500,000 Units, 5 mL, Oral, QID, Lanney Gins, Christan Defranco, MD, 500,000 Units at 08/09/19 2156 .  ondansetron (ZOFRAN) injection 4 mg, 4 mg, Intravenous, Q6H PRN, Mortimer Fries, Kurian, MD .  potassium chloride 20 MEQ/15ML (10%) solution 40 mEq, 40 mEq, Per Tube, BID, Darel Hong D, NP .  sodium chloride flush (NS) 0.9 % injection 10-40 mL, 10-40 mL, Intracatheter, PRN, Flora Lipps, MD, 10 mL at 08/03/19 0929 .  [COMPLETED] thiamine 556m in normal saline (559m IVPB, 500 mg, Intravenous, Daily, Last Rate: 100 mL/hr at 08/05/19 1033, 500 mg at 08/05/19  1033 **FOLLOWED BY** thiamine tablet 100 mg, 100 mg, Per Tube, Daily, KaFlora LippsMD, 100 mg at 08/09/19 0843 .  vancomycin (VANCOREADY) IVPB 1250 mg/250 mL, 1,250 mg, Intravenous, Q24H, AlOttie GlazierMD, Stopped at 08/09/19 1538    ALLERGIES   Patient has no allergy information on record.    REVIEW OF SYSTEMS    Unable to obtain due to severity of patient condition.  PHYSICAL EXAMINATION   Vital Signs: Temp:  [98.5 F (36.9 C)-99.8 F (37.7 C)] 99.2 F (37.3 C) (01/22 0755) Pulse Rate:  [81-112] 85 (01/22 0500) Resp:  [18-28] 20 (01/22 0500) BP: (118-161)/(44-90) 161/50 (01/22 0500) SpO2:  [91 %-98 %] 96 % (01/22 0500) FiO2 (%):  [30 %] 30 % (01/22 0808)  GENERAL:Patient is an ill-appearing obese man.  Intubated HEAD: Normocephalic, atraumatic.  EYES: Pupils equal, round, slowly reactive to light.  No scleral icterus.  MOUTH: Moist mucosal membrane. NECK: Supple. No thyromegaly. No nodules. No JVD.  PULMONARY: Wheezing and rhonchi present bilaterally diminished in bilateral lower lobes CARDIOVASCULAR: S1 and S2. Regular rate and rhythm. Distant heart sounds.  Systolic murmur at right upper  sternal border consistent with aortic stenosis. GASTROINTESTINAL: Soft, nontender, non-distended. No masses. Positive bowel sounds. No hepatosplenomegaly.  MUSCULOSKELETAL: Edema in bilateral fingers.   NEUROLOGIC: Obtunded.  Pupils slowly reactive. Corneal reflex sluggish.  Cough reflex intact.  Patient does not follow commands.  Makes non-purposeful movements of extremities.   SKIN:intact,warm,dry   PERTINENT DATA     Infusions: . sodium chloride Stopped (08/09/19 0845)  . cefTRIAXone (ROCEPHIN)  IV Stopped (08/09/19 1437)  . vancomycin Stopped (08/09/19 1538)   Scheduled Medications: . amLODipine  10 mg Per Tube Daily  . chlorhexidine gluconate (MEDLINE KIT)  15 mL Mouth Rinse BID  . Chlorhexidine Gluconate Cloth  6 each Topical Daily  . enoxaparin (LOVENOX) injection   40 mg Subcutaneous Q24H  . famotidine  20 mg Per Tube BID  . feeding supplement (PRO-STAT SUGAR FREE 64)  60 mL Per Tube BID  . feeding supplement (VITAL HIGH PROTEIN)  1,000 mL Per Tube Q24H  . folic acid  1 mg Per Tube Daily  . free water  400 mL Per Tube Q4H  . insulin aspart  0-15 Units Subcutaneous Q4H  . ipratropium-albuterol  3 mL Nebulization Q6H  . liver oil-zinc oxide   Topical BID  . mouth rinse  15 mL Mouth Rinse 10 times per day  . multivitamin  15 mL Oral Daily  . nitroGLYCERIN  0.5 inch Topical Q6H  . nystatin  5 mL Oral QID  . potassium chloride  40 mEq Per Tube BID  . thiamine  100 mg Per Tube Daily   PRN Medications: sodium chloride, acetaminophen, hydrALAZINE, metoprolol tartrate, ondansetron (ZOFRAN) IV, sodium chloride flush Hemodynamic parameters:   Intake/Output: 01/21 0701 - 01/22 0700 In: 1109.8 [I.V.:79.2; NG/GT:590; IV Piggyback:440.6] Out: 1990 [ZOXWR:6045; Stool:500]  Ventilator  Settings: Vent Mode: PRVC FiO2 (%):  [30 %] 30 % Set Rate:  [20 bmp] 20 bmp Vt Set:  [500 mL] 500 mL PEEP:  [5 cmH20] 5 cmH20 Plateau Pressure:  [14 cmH20-17 cmH20] 15 cmH20   LAB RESULTS:  Basic Metabolic Panel: Recent Labs  Lab 08/04/19 0345 08/05/19 4098 08/06/19 0732 08/06/19 0732 08/07/19 0359 08/07/19 0359 08/08/19 0927 08/08/19 0927 08/09/19 0438 08/10/19 0406  NA 146*   < > 149*  --  152*  --  153*  --  154* 154*  K 3.5   < > 4.4   < > 3.6   < > 3.6   < > 2.8* 3.0*  CL 114*   < > 115*  --  114*  --  115*  --  114* 115*  CO2 25   < > 23  --  29  --  27  --  32 31  GLUCOSE 118*   < > 147*  --  111*  --  140*  --  142* 120*  BUN 51*   < > 58*  --  54*  --  57*  --  60* 51*  CREATININE 1.08   < > 1.10  --  1.01  --  1.16  --  0.97 0.95  CALCIUM 8.2*   < > 9.0  --  8.8*  --  8.6*  --  8.6* 8.1*  MG 2.6*  --  2.6*  --  2.1  --  2.6*  --   --   --   PHOS 2.3*  --  3.3  --   --   --   --   --   --   --    < > =  values in this interval not displayed.    Liver Function Tests: Recent Labs  Lab 08/08/19 0927  AST 87*  ALT 92*  ALKPHOS 55  BILITOT 0.8  PROT 6.6  ALBUMIN 2.9*   No results for input(s): LIPASE, AMYLASE in the last 168 hours. Recent Labs  Lab 08/08/19 1234  AMMONIA 64*   CBC: Recent Labs  Lab 08/04/19 0345 08/04/19 0345 08/05/19 3329 08/06/19 0732 08/07/19 0359 08/09/19 0438 08/10/19 0406  WBC 10.5   < > 10.6* 12.3* 11.5* 13.7* 10.4  NEUTROABS 6.8  --   --  9.8* 8.6*  --   --   HGB 13.1   < > 13.6 14.0 14.1 12.5* 11.7*  HCT 42.5   < > 44.3 43.9 43.0 40.7 38.4*  MCV 101.9*   < > 101.6* 98.9 97.7 102.3* 102.9*  PLT 249   < > 282 257 282 242 254   < > = values in this interval not displayed.   Cardiac Enzymes: No results for input(s): CKTOTAL, CKMB, CKMBINDEX, TROPONINI in the last 168 hours. BNP: Invalid input(s): POCBNP CBG: Recent Labs  Lab 08/09/19 1511 08/09/19 1937 08/09/19 2331 08/10/19 0359 08/10/19 0724  GLUCAP 115* 100* 128* 105* 94     IMAGING RESULTS:  Imaging: EEG  Result Date: 08/08/2019 Alexis Goodell, MD     08/08/2019  5:42 PM ELECTROENCEPHALOGRAM REPORT Patient: Antonio [name]       Room #: [facility] EEG No. ID: [id number] Age: 73 y.o.        Sex: male Referring Physician: Lanney Gins Report Date:  08/08/2019       Interpreting Physician: Alexis Goodell History: Antonio [name] is an 74 y.o. male found down Medications: Norvasc, Insulin, Zosyn, Vancomycin, Thiamine Conditions of Recording:  This is a 21 channel routine scalp EEG performed with bipolar and monopolar montages arranged in accordance to the international 10/20 system of electrode placement. One channel was dedicated to EKG recording. The patient is in the intubated and unresponsive state. Description:  Artifact is prominent during the recording often obscuring the background rhythm. When able to be visualized the background is slow and poorly organized.  At times some poorly sustained alpha activity can be identified but  the predominant rhythms are theta and delta rhythms.  This activity is diffusely distributed and continuous.  No epileptiform activity is noted.  The patient is stimulated during the recording with no activation of the background rhythm noted.  Hyperventilation and intermittent photic stimulation were not performed. IMPRESSION: This is an abnormal EEG secondary to general background slowing.  This finding may be seen with a diffuse disturbance that is etiologically nonspecific, but may include a metabolic encephalopathy, among other possibilities.  No epileptiform activity is noted.  Alexis Goodell, MD Neurology [phone] 08/08/2019, 5:35 PM   DG Chest Port 1 View  Result Date: 08/10/2019 CLINICAL DATA:  Shortness of breath EXAM: PORTABLE CHEST 1 VIEW COMPARISON:  August 08, 2019 FINDINGS: The right-sided PICC line tip terminates over the right brachiocephalic vein. The endotracheal tube terminates above the carina. The enteric tube extends below the left hemidiaphragm. There is no pneumothorax. There may be a small left-sided pleural effusion. Bibasilar atelectasis is suspected. IMPRESSION: 1. The right-sided PICC line appears to have retracted slightly with the tip now residing over the brachiocephalic vein. 2. The remaining lines and tubes are as above. 3. Otherwise, no significant interval change. Electronically Signed   By: Constance Holster M.D.   On: 08/10/2019 02:07   DG Chest [facility]  1 View  Result Date: 08/08/2019 CLINICAL DATA:  PICC line placement EXAM: PORTABLE CHEST 1 VIEW COMPARISON:  08/08/2019, earlier same day FINDINGS: 1225 hours. Low volume film does not include the right lung base. The cardio pericardial silhouette is enlarged. Bibasilar atelectasis/infiltrate is similar to prior with small left pleural effusion less well demonstrated. New right PICC line tip projects in the region of the innominate vein confluence. Endotracheal tube tip is approximately 5.5 cm above the base of the  carina. The NG tube passes into the stomach although the distal tip position is not included on the film. IMPRESSION: 1. New right PICC line tip projects in the region of the innominate vein confluence. 2. Otherwise stable exam. Electronically Signed   By: Misty Stanley M.D.   On: 08/08/2019 12:39        ASSESSMENT AND PLAN    -Multidisciplinary rounds held today  Acute Hypoxic Respiratory Failure  -Due to acute decompensated systolic CHF as well as possible pneumonia since patient has been febrile with leukocytosis. -Has diuresed well now proximately 6 L net negative -continue Full MV support -continue Bronchodilator Therapy with budesonide and duoneb -Wean Fio2 and PEEP as tolerated -will perform SAT/SBT when respiratory parameters are met - stopping lasix today- due to signs of volume contraction now -Continue Zosyn   Altered mental status -patient is able to open eyes and is starting to visually track. -Discussed case with neurology-Dr. Reynolds-appreciate input -Myriad of metabolic causes have been evaluated and are thus far negative -Status post EEG -Status post MRI brain no acute findings -will perform ACTH stim test today\ -will obtain EEG today -TSH is low, subclinical hyperthyroidism due to acutely ill state    CARDIAC FAILURE with grade I diastolic dysfunction -oxygen as needed -Lasix as tolerated -follow up cardiac enzymes as indicated - Continue topical nitroglycerin ICU monitoring  Hypertension - BP continues to stay in the 175/37 mmHg range. - amlodipine 10 mg daily - Metoprolol  Q4H prn for hypertension - Clonidine 0.1 mg BID   Alcoholism Monitoring for signs of withdrawal - History of alcohol abuse - Continue thiamine 734 mg daily, folic acid 1 mg daily - Modified CIWA protocol with hydralazine    Hypernatremia  -Continue free water flushes increased to 350 every 4 -Continue Lasix -Switch Zosyn due to high sodium load to Rocephin -concern  for central diabetes insipidus - trial of DDAVP today with repeat BMP  NEUROLOGY - intubated and sedated with fentanyl 100 mcg - minimal sedation to achieve a RASS goal: -1 Wake up assessment pending - Patient remains obtunded after discontinuing sedation with sluggish pupils.  Is not following commands.  - Due to mental status, hypertension, and widened pulse pressure, consider MRI head.   GI/Nutrition GI PROPHYLAXIS as indicated DIET-->Continue TF's as tolerated Patient continues to have loose bowel movements- rectal tube in place.  Hold stool softeners.     ENDO - ICU hypoglycemic\Hyperglycemia protocol -check FSBS per protocol - Continue insulin sliding scale  Intertrigo in groin - Begin application of hydrocerin cream BID. - Will continue nursing wound care and bedding changes to reduce the risk of further breakdown.   Goals of care - Patient DNR status  - Patient has been intubated for 9 days.  Plan family meeting in the next couple of days to discuss patient prognosis and goals of care including PEG and trach placement if no clinical improvement.   ELECTROLYTES -follow labs as needed -replace as needed  -pharmacy consultation   DVT/GI PRX  ordered - Lovenox 40 mg QD for DVT prophylaxis TRANSFUSIONS AS NEEDED MONITOR FSBS ASSESS the need for LABS as needed   Critical care provider statement:    Critical care time (minutes):  34   Critical care time was exclusive of:  Separately billable procedures and treating other patients   Critical care was necessary to treat or prevent imminent or life-threatening deterioration of the following conditions:  Acute respiratory failure, Delerium tremens   Critical care was time spent personally by me on the following activities:  Development of treatment plan with patient or surrogate, discussions with consultants, evaluation of patient's response to treatment, examination of patient, obtaining history from patient or surrogate,  ordering and performing treatments and interventions, ordering and review of laboratory studies and re-evaluation of patient's condition.  I assumed direction of critical care for this patient from another provider in my specialty: no    Patient remains critically ill with multi-organ failure.  At risk of developing cardiac arrest and death. Patient currently DNR status.    Ottie Glazier, M.D.  Division of [place]

## 2019-08-10 NOTE — Progress Notes (Signed)
Subjective: Patient a little more alert today.    Objective: Current vital signs: BP (!) 161/50   Pulse 85   Temp 99.2 F (37.3 C) (Oral)   Resp 20   Ht 5' 7.01" (1.702 m)   Wt 107.7 kg   SpO2 96%   BMI 37.18 kg/m  Vital signs in last 24 hours: Temp:  [98.5 F (36.9 C)-99.8 F (37.7 C)] 99.2 F (37.3 C) (01/22 0755) Pulse Rate:  [81-112] 85 (01/22 0500) Resp:  [18-28] 20 (01/22 0500) BP: (118-161)/(44-90) 161/50 (01/22 0500) SpO2:  [91 %-98 %] 96 % (01/22 0500) FiO2 (%):  [30 %] 30 % (01/22 0808)  Intake/Output from previous day: 01/21 0701 - 01/22 0700 In: 1109.8 [I.V.:79.2; NG/GT:590; IV Piggyback:440.6] Out: 1990 [Urine:1490; Stool:500] Intake/Output this shift: Total I/O In: -  Out: 150 [Urine:150] Nutritional status:  Diet Order    None      Neurologic Exam: Mental Status: Eyes closed but opens eyes when name called.  Focuses on examiner.  Does not follow commands.  No attempts at communication.   Cranial Nerves: II: patient does not respond confrontation bilaterally, pupils right 3 mm, left 3 mm,and reactive bilaterally III,IV,VI: Oculocephalic response present bilaterally.  V,VII: corneal reflex present bilaterally  VIII: Grossly intact IX,X: gag reflex unable to be tested, XI: trapezius strength unable to test bilaterally XII: tongue strength unable to test Motor: Movement noted in the lower extremities with stimulation.   Sensory: Respond to noxious stimuli in all extremities.   Lab Results: Basic Metabolic Panel: Recent Labs  Lab 08/04/19 0345 08/05/19 9735 08/06/19 0732 08/06/19 0732 08/07/19 0359 08/07/19 0359 08/08/19 0927 08/09/19 0438 08/10/19 0406  NA 146*   < > 149*  --  152*  --  153* 154* 154*  K 3.5   < > 4.4  --  3.6  --  3.6 2.8* 3.0*  CL 114*   < > 115*  --  114*  --  115* 114* 115*  CO2 25   < > 23  --  29  --  27 32 31  GLUCOSE 118*   < > 147*  --  111*  --  140* 142* 120*  BUN 51*   < > 58*  --  54*  --  57* 60* 51*   CREATININE 1.08   < > 1.10  --  1.01  --  1.16 0.97 0.95  CALCIUM 8.2*   < > 9.0   < > 8.8*   < > 8.6* 8.6* 8.1*  MG 2.6*  --  2.6*  --  2.1  --  2.6*  --   --   PHOS 2.3*  --  3.3  --   --   --   --   --   --    < > = values in this interval not displayed.    Liver Function Tests: Recent Labs  Lab 08/08/19 0927  AST 87*  ALT 92*  ALKPHOS 55  BILITOT 0.8  PROT 6.6  ALBUMIN 2.9*   No results for input(s): LIPASE, AMYLASE in the last 168 hours. Recent Labs  Lab 08/08/19 1234  AMMONIA 64*    CBC: Recent Labs  Lab 08/04/19 0345 08/04/19 0345 08/05/19 3299 08/06/19 0732 08/07/19 0359 08/09/19 0438 08/10/19 0406  WBC 10.5   < > 10.6* 12.3* 11.5* 13.7* 10.4  NEUTROABS 6.8  --   --  9.8* 8.6*  --   --   HGB 13.1   < >  13.6 14.0 14.1 12.5* 11.7*  HCT 42.5   < > 44.3 43.9 43.0 40.7 38.4*  MCV 101.9*   < > 101.6* 98.9 97.7 102.3* 102.9*  PLT 249   < > 282 257 282 242 254   < > = values in this interval not displayed.    Cardiac Enzymes: No results for input(s): CKTOTAL, CKMB, CKMBINDEX, TROPONINI in the last 168 hours.  Lipid Panel: No results for input(s): CHOL, TRIG, HDL, CHOLHDL, VLDL, LDLCALC in the last 168 hours.  CBG: Recent Labs  Lab 08/09/19 1511 08/09/19 1937 08/09/19 2331 08/10/19 0359 08/10/19 0724  GLUCAP 115* 100* 128* 105* 94    Microbiology: Results for orders placed or performed during the hospital encounter of 07/27/19  Blood Culture (routine x 2)     Status: None   Collection Time: 07/27/19 10:45 PM   Specimen: BLOOD  Result Value Ref Range Status   Specimen Description BLOOD Blood Culture adequate volume  Final   Special Requests   Final    BOTTLES DRAWN AEROBIC AND ANAEROBIC LEFT ANTECUBITAL   Culture   Final    NO GROWTH 5 DAYS Performed at [facility], [address] [place], [place] [address]    Report Status 08/01/2019 FINAL  Final  Respiratory Panel by RT PCR (Flu A&B, Covid) - Nasopharyngeal Swab     Status: None    Collection Time: 07/27/19 10:45 PM   Specimen: Nasopharyngeal Swab  Result Value Ref Range Status   SARS Coronavirus 2 by RT PCR NEGATIVE NEGATIVE Final    Comment: (NOTE) SARS-CoV-2 target nucleic acids are NOT DETECTED. The SARS-CoV-2 RNA is generally detectable in upper respiratoy specimens during the acute phase of infection. The lowest concentration of SARS-CoV-2 viral copies this assay can detect is 131 copies/mL. A negative result does not preclude SARS-Cov-2 infection and should not be used as the sole basis for treatment or other patient management decisions. A negative result may occur with  improper specimen collection/handling, submission of specimen other than nasopharyngeal swab, presence of viral mutation(s) within the areas targeted by this assay, and inadequate number of viral copies (<131 copies/mL). A negative result must be combined with clinical observations, patient history, and epidemiological information. The expected result is Negative. Fact Sheet for Patients:  PinkCheek.be Fact Sheet for Healthcare Providers:  GravelBags.it This test is not yet ap proved or cleared by the [place] FDA and  has been authorized for detection and/or diagnosis of SARS-CoV-2 by FDA under an Emergency Use Authorization (EUA). This EUA will remain  in effect (meaning this test can be used) for the duration of the COVID-19 declaration under Section 564(b)(1) of the Act, 21 U.S.C. section 360bbb-3(b)(1), unless the authorization is terminated or revoked sooner.    Influenza A by PCR NEGATIVE NEGATIVE Final   Influenza B by PCR NEGATIVE NEGATIVE Final    Comment: (NOTE) The Xpert Xpress SARS-CoV-2/FLU/RSV assay is intended as an aid in  the diagnosis of influenza from Nasopharyngeal swab specimens and  should not be used as a sole basis for treatment. Nasal washings and  aspirates are unacceptable for Xpert Xpress  SARS-CoV-2/FLU/RSV  testing. Fact Sheet for Patients: PinkCheek.be Fact Sheet for Healthcare Providers: GravelBags.it This test is not yet approved or cleared by the [place] FDA and  has been authorized for detection and/or diagnosis of SARS-CoV-2 by  FDA under an Emergency Use Authorization (EUA). This EUA will remain  in effect (meaning this test can be used) for the  duration of the  Covid-19 declaration under Section 564(b)(1) of the Act, 21  U.S.C. section 360bbb-3(b)(1), unless the authorization is  terminated or revoked. Performed at [facility], [address], [place], [place] [address]   Urine Culture     Status: None   Collection Time: 07/27/19 10:45 PM   Specimen: Urine, Random  Result Value Ref Range Status   Specimen Description   Final    URINE, RANDOM Performed at [facility], [address], [place], [place] [address]    Special Requests   Final    NONE Performed at [facility], [address], [place], [place] [address]    Culture   Final    NO GROWTH Performed at [facility], [place] [address] [place], [place] [address]    Report Status 07/29/2019 FINAL  Final  MRSA PCR Screening     Status: None   Collection Time: 07/28/19 11:07 AM   Specimen: Nasal Mucosa; Nasopharyngeal  Result Value Ref Range Status   MRSA by PCR NEGATIVE NEGATIVE Final    Comment:        The GeneXpert MRSA Assay (FDA approved for NASAL specimens only), is one component of a comprehensive MRSA colonization surveillance program. It is not intended to diagnose MRSA infection nor to guide or monitor treatment for MRSA infections. Performed at [facility], [address] [place], [place] [address]   Urine Culture     Status: None   Collection Time: 07/28/19  5:15 PM   Specimen: Urine, Random  Result Value Ref Range Status   Specimen Description   Final    URINE,  RANDOM Performed at [facility], [address] [place], [place] [address]    Special Requests   Final    NONE Performed at [facility], [address], [place], [place] [address]    Culture   Final    NO GROWTH Performed at [facility], [place] [address] [place], [place] [address]    Report Status 07/30/2019 FINAL  Final  Culture, respiratory     Status: None   Collection Time: 07/30/19 11:50 AM   Specimen: SPU  Result Value Ref Range Status   Specimen Description   Final    SPUTUM Performed at [facility], [address], [place], [place] [address]    Special Requests   Final    NONE Performed at [facility], [place], [place] [address]    Gram Stain   Final    ABUNDANT WBC PRESENT,BOTH PMN AND MONONUCLEAR RARE GRAM POSITIVE COCCI RARE GRAM VARIABLE ROD    Culture   Final    RARE Consistent with normal respiratory flora. Performed at [facility], [place] [address], [place], [place] [address]    Report Status 08/02/2019 FINAL  Final  CULTURE, BLOOD (ROUTINE X 2) w Reflex to ID Panel     Status: None (Preliminary result)   Collection Time: 08/08/19  9:27 AM   Specimen: BLOOD  Result Value Ref Range Status   Specimen Description BLOOD RIGHT FOOT  Final   Special Requests   Final    BOTTLES DRAWN AEROBIC AND ANAEROBIC Blood Culture adequate volume   Culture   Final    NO GROWTH 2 DAYS Performed at [facility], [address] [place], [place] [address]    Report Status PENDING  Incomplete  CULTURE, BLOOD (ROUTINE X 2) w Reflex to ID  Panel     Status: None (Preliminary result)   Collection Time: 08/08/19  9:27 AM   Specimen: BLOOD  Result Value Ref Range Status   Specimen Description BLOOD LEFT FOOT  Final   Special Requests   Final    BOTTLES DRAWN AEROBIC AND ANAEROBIC Blood Culture results may not be optimal due to an excessive volume of blood received in culture bottles   Culture    Final    NO GROWTH 2 DAYS Performed at [facility], [address], [place], [place] [address]    Report Status PENDING  Incomplete  MRSA PCR Screening     Status: None   Collection Time: 08/08/19  9:43 AM   Specimen: Nasopharyngeal  Result Value Ref Range Status   MRSA by PCR NEGATIVE NEGATIVE Final    Comment:        The GeneXpert MRSA Assay (FDA approved for NASAL specimens only), is one component of a comprehensive MRSA colonization surveillance program. It is not intended to diagnose MRSA infection nor to guide or monitor treatment for MRSA infections. Performed at [facility], [address] [place], [place] [address]   Culture, respiratory (non-expectorated)     Status: None (Preliminary result)   Collection Time: 08/08/19  5:01 PM   Specimen: Tracheal Aspirate; Respiratory  Result Value Ref Range Status   Specimen Description   Final    TRACHEAL ASPIRATE Performed at [facility], [address], [place], [place] [address]    Special Requests   Final    NONE Performed at [facility], [address] [place], [place] [address]    Gram Stain   Final    RARE WBC PRESENT, PREDOMINANTLY PMN RARE GRAM POSITIVE COCCI IN PAIRS    Culture   Final    RARE STAPHYLOCOCCUS AUREUS SUSCEPTIBILITIES TO FOLLOW CULTURE REINCUBATED FOR BETTER GROWTH Performed at [facility], [place] [address] [place], [place] [address]    Report Status PENDING  Incomplete    Coagulation Studies: Recent Labs    08/08/19 1234  LABPROT 17.4*  INR 1.4*    Imaging: EEG  Result Date: 08/08/2019 Alexis Goodell, MD     08/08/2019  5:42 PM ELECTROENCEPHALOGRAM REPORT Patient: Antonio [name]       Room #: [facility] EEG No. ID: [id number] Age: 74 y.o.        Sex: male Referring Physician: Lanney Gins Report Date:  08/08/2019       Interpreting Physician: Alexis Goodell History: Antonio [name] is an 74 y.o. male found down Medications: Norvasc, Insulin,  Zosyn, Vancomycin, Thiamine Conditions of Recording:  This is a 21 channel routine scalp EEG performed with bipolar and monopolar montages arranged in accordance to the international 10/20 system of electrode placement. One channel was dedicated to EKG recording. The patient is in the intubated and unresponsive state. Description:  Artifact is prominent during the recording often obscuring the background rhythm. When able to be visualized the background is slow and poorly organized.  At times some poorly sustained alpha activity can be identified but the predominant rhythms are theta and delta rhythms.  This activity is diffusely distributed and continuous.  No epileptiform activity is noted.  The patient is stimulated during the recording with no activation of the background rhythm noted.  Hyperventilation and intermittent photic stimulation were not performed. IMPRESSION: This is an abnormal EEG secondary to general background slowing.  This finding may be seen with a diffuse disturbance that is etiologically nonspecific,  but may include a metabolic encephalopathy, among other possibilities.  No epileptiform activity is noted.  Alexis Goodell, MD Neurology [phone] 08/08/2019, 5:35 PM   DG Chest Port 1 View  Result Date: 08/10/2019 CLINICAL DATA:  Shortness of breath EXAM: PORTABLE CHEST 1 VIEW COMPARISON:  August 08, 2019 FINDINGS: The right-sided PICC line tip terminates over the right brachiocephalic vein. The endotracheal tube terminates above the carina. The enteric tube extends below the left hemidiaphragm. There is no pneumothorax. There may be a small left-sided pleural effusion. Bibasilar atelectasis is suspected. IMPRESSION: 1. The right-sided PICC line appears to have retracted slightly with the tip now residing over the brachiocephalic vein. 2. The remaining lines and tubes are as above. 3. Otherwise, no significant interval change. Electronically Signed   By: Constance Holster M.D.   On:  08/10/2019 02:07   DG Chest Port 1 View  Result Date: 08/08/2019 CLINICAL DATA:  PICC line placement EXAM: PORTABLE CHEST 1 VIEW COMPARISON:  08/08/2019, earlier same day FINDINGS: 1225 hours. Low volume film does not include the right lung base. The cardio pericardial silhouette is enlarged. Bibasilar atelectasis/infiltrate is similar to prior with small left pleural effusion less well demonstrated. New right PICC line tip projects in the region of the innominate vein confluence. Endotracheal tube tip is approximately 5.5 cm above the base of the carina. The NG tube passes into the stomach although the distal tip position is not included on the film. IMPRESSION: 1. New right PICC line tip projects in the region of the innominate vein confluence. 2. Otherwise stable exam. Electronically Signed   By: Misty Stanley M.D.   On: 08/08/2019 12:39    Medications:  I have reviewed the patient's current medications. Scheduled: . amLODipine  10 mg Per Tube Daily  . chlorhexidine gluconate (MEDLINE KIT)  15 mL Mouth Rinse BID  . Chlorhexidine Gluconate Cloth  6 each Topical Daily  . enoxaparin (LOVENOX) injection  40 mg Subcutaneous Q24H  . famotidine  20 mg Per Tube BID  . feeding supplement (PRO-STAT SUGAR FREE 64)  60 mL Per Tube BID  . feeding supplement (VITAL HIGH PROTEIN)  1,000 mL Per Tube Q24H  . folic acid  1 mg Per Tube Daily  . free water  400 mL Per Tube Q4H  . insulin aspart  0-15 Units Subcutaneous Q4H  . ipratropium-albuterol  3 mL Nebulization Q6H  . liver oil-zinc oxide   Topical BID  . mouth rinse  15 mL Mouth Rinse 10 times per day  . multivitamin  15 mL Oral Daily  . nitroGLYCERIN  0.5 inch Topical Q6H  . nystatin  5 mL Oral QID  . potassium chloride  40 mEq Per Tube BID  . thiamine  100 mg Per Tube Daily    Assessment/Plan: 74 y.o. male  with a history of ETOH abuse and suspected COPD who presented on 1/8 after collapsing.  Remains altered but slowly improving.  Low grade  temp overnight.  White blood cell count improving.  On Rocephin.    Recommendations: 1. Agree with continued addressing of infection and multiple metabolic issues. 2. With improvement in patient and indicators of infection would not proceed to LP at this time.  Case discussed with Dr. Lanney Gins.  Will continue to follow with you.     LOS: 13 days   Alexis Goodell, MD Neurology [phone] 08/10/2019  10:51 AM

## 2019-08-10 NOTE — Procedures (Signed)
FLEXIBLE BRONCHOSCOPY PROCEDURE NOTE    Flexible bronchoscopy was performed on 08/10/19  by : Lanney Gins  assistance by : 1)Shannot RT and 2) Joe RT and 3) Casey Burkitt RN    Indication for the procedure was :  Pre-procedural H&P. The following assessment was performed on the day of the procedure prior to initiating sedation History:    Examination Vital signs -reviewed as per nursing documentation today Cardiac    Murmurs: n  Rubs : n  Gallop: n Lungs Wheezing: n Rales : y Rhonchi :y  Other pertinent findings: sepsis   Pre-procedural assessment for Procedural Sedation included: Depth of sedation: As per anesthesia team  ASA Classification:  n Mallampati airway assessment: n    Medication list reviewed: n  The patient's interval history was taken and revealed: no new complaints The pre- procedure physical examination revealed: No new findings Refer to prior clinic note for details.  Informed Consent: Informed consent was obtained from:  patient after explanation of procedure and risks, benefits, as well as alternative procedures available.  Explanation of level of sedation and possible transfusion was also provided.    Procedural Preparation: Time out was performed and patient was identified by name and birthdate and procedure to be performed and side for sampling, if any, was specified. Pt was intubated by anesthesia.  The patient was appropriately draped.  Procedure Findings: Bronchoscope was inserted via ETT  without difficulty.  Posterior oropharynx, epiglottis, arytenoids, false cords and vocal cords were not visualized as these were bypassed by endotracheal tube.   The distal trachea was normal in circumference and appearance without mucosal, cartilaginous or branching abnormalities.  The main carina was splayed .   All right and left lobar airways were visualized to the Sub-segmental level.  Sub- sub segmental carinae were identified in all the distal  airways.   Secretions were visible in the following airways and appeared to be mucopurulent.  The mucosa was : mildly friable  Airways were notable for:        exophytic lesions :n       extrinsic compression in the following distributions: n.       Friable mucosa: n       Anthrocotic material /pigmentation: n   Pictorial documentation attached: n      Specimens obtained included:  Broncho-alveolar lavage site:RML  sent for microbiology                             23m volume infused 266mvolume returned with mucoid appearance   Immediate sampling complications included:n Epinephrine 86m73mas used topically  The bronchoscopy was terminated due to completion of the planned procedure and the bronchoscope was removed.   Total dosage of Lidocaine was 86mg74mtal fluoroscopy time was 0 minutes   Moderate sedation used: Fentanyl 586mc75m Estimated Blood loss: 0cc.  Complications included:  none     Follow up with Dr. AleskLanney Gins5 days for result discussion.   [facility] Claud[facility] Stapler[facility]

## 2019-08-10 NOTE — Consult Note (Signed)
PHARMACY CONSULT NOTE - FOLLOW UP  Pharmacy Consult for Electrolyte Monitoring and Replacement   Antonio [name] is a morbidly obese 74 y.o. male admitted on 07/27/2019. EMS stated that he collapsed on the stretcher and became unresponsive. Pt was apneic, but still had a pulse. Pt has AKI, severe respiratory distress, and acute respiratory failure. Pt is intubated on mechanical ventilation. Per am rounds, desmopressin will be initiated to reduce sodium levels, along with free water flushes. Pharmacy consulted to assist with replacing and monitoring electrolytes.   Recent Labs: Potassium (mmol/L)  Date Value  08/10/2019 3.0 (L)   Magnesium (mg/dL)  Date Value  08/08/2019 2.6 (H)   Calcium (mg/dL)  Date Value  08/10/2019 8.1 (L)   Albumin (g/dL)  Date Value  08/08/2019 2.9 (L)   Phosphorus (mg/dL)  Date Value  08/06/2019 3.3   Sodium (mmol/L)  Date Value  08/10/2019 154 (H)    Assessment: 1. Electrolytes: No changes in sodium levels since yesterday, despite the free water flushes. MD recommended desmopressin to help reduce sodium levels along with free water flushes. Sodium improved to 150, noted in pm labs today. Potassium levels have improved but are still low and will replace with 44mEq x 2 doses. Corrected calcium level is 8.98 (WNL). Continue monitoring electrolytes in am labs. Will replace to achieve goal potassium ~4, sodium ~140, and magnesium ~2.    2. Glucose: Today's range: 94-120. Glucose levels are lower than yesterday. Glucose-capillary monitored Q4H. Continue insulin aspart 0-15 [place] Q4H and monitor glucose daily.   3. Constipation: Last BM: 01/22. Continue monitoring BM and add constipation-relief meds if constipation recurs.    Roanna Banning ,Pharmacy Student Clinical Pharmacist 08/10/2019 11:55 AM

## 2019-08-10 NOTE — Progress Notes (Addendum)
Pharmacy Antibiotic Note  Antonio [name] is a morbidly obese 74 y.o. male admitted on 07/27/2019. EMS stated that he collapsed on the stretcher and became unresponsive. Pt was apneic, but still had a pulse. Pt has AKI, severe respiratory distress, and acute respiratory failure. Pt is intubated on mechanical ventilation. Pt has afebrile and WBCs are WNL. Ceftriaxone was started yesterday for possible pneumonia, but now need to increase the dose for CNS dosing for possible meningitis stated by neurologist. Pharmacy has been consulted for ceftriaxone and vancomyin dosing.  Plan: Change ceftriaxone dosing to 2g IV Q12H (for CNS coverage)  Continue vancomycin 1250mg  IV Q24H. Monitor renal function with am labs and adjust dose accordingly. Will plan for levels on Monday if vanc to continue. Will de-escalate per respiratory culture. Will dose vanc based on treatment for pnuemonia.   Height: 5' 7.01" (170.2 cm) Weight: 237 lb 7 oz (107.7 kg) IBW/kg (Calculated) : 66.12  Temp (24hrs), Avg:99.1 F (37.3 C), Min:98.5 F (36.9 C), Max:99.8 F (37.7 C)  Recent Labs  Lab 08/05/19 0632 08/05/19 [id number] 08/06/19 0732 08/07/19 0359 08/08/19 0927 08/09/19 0438 08/10/19 0406  WBC 10.6*  --  12.3* 11.5*  --  13.7* 10.4  CREATININE 1.08   < > 1.10 1.01 1.16 0.97 0.95   < > = values in this interval not displayed.    Estimated Creatinine Clearance: 79.8 mL/min (by C-G formula based on SCr of 0.95 mg/dL).    Not on File  Antimicrobials this admission: Ciprofloxacin 400mg  IV 01/10 >> 01/11 Ceftriaxone 1g IV 01/11 >> 01/12 Unasyn 3g IV 01/13 >> 01/15 Zosyn 3.375mg  IV 01/20 >> 01/21 Vancomycin 1250mg  IV (+loading dose on day 1) 01/20 >>  Ceftriaxone 2g IV 01/21 >>   Dose adjustments this admission: NA  Microbiology results: 01/20 BCx: No growth for 2 days 01/20 BCx: No growth for 2 days 01/20 Respiratory Cx: S. Aureus, susceptibilities to follow; cx reincubated for better growth   01/11 Sputum:  Consistent with normal respiratory flora  01/20 MRSA PCR: Negative 01/08 SARS Coronavirus PCR: Negative 01/08 Influenza A/B PCR: Negative   Thank you for allowing pharmacy to be a part of this patient's care.  Roanna Banning 08/10/2019 11:17 AM

## 2019-08-11 ENCOUNTER — Inpatient Hospital Stay: Payer: Medicare Other

## 2019-08-11 LAB — CBC WITH DIFFERENTIAL/PLATELET
Abs Immature Granulocytes: 0.08 10*3/uL — ABNORMAL HIGH (ref 0.00–0.07)
Basophils Absolute: 0.1 10*3/uL (ref 0.0–0.1)
Basophils Relative: 1 %
Eosinophils Absolute: 0.3 10*3/uL (ref 0.0–0.5)
Eosinophils Relative: 3 %
HCT: 37.9 % — ABNORMAL LOW (ref 39.0–52.0)
Hemoglobin: 11.7 g/dL — ABNORMAL LOW (ref 13.0–17.0)
Immature Granulocytes: 1 %
Lymphocytes Relative: 15 %
Lymphs Abs: 1.6 10*3/uL (ref 0.7–4.0)
MCH: 31.8 pg (ref 26.0–34.0)
MCHC: 30.9 g/dL (ref 30.0–36.0)
MCV: 103 fL — ABNORMAL HIGH (ref 80.0–100.0)
Monocytes Absolute: 0.7 10*3/uL (ref 0.1–1.0)
Monocytes Relative: 7 %
Neutro Abs: 7.6 10*3/uL (ref 1.7–7.7)
Neutrophils Relative %: 73 %
Platelets: 249 10*3/uL (ref 150–400)
RBC: 3.68 MIL/uL — ABNORMAL LOW (ref 4.22–5.81)
RDW: 13.8 % (ref 11.5–15.5)
WBC: 10.3 10*3/uL (ref 4.0–10.5)
nRBC: 0 % (ref 0.0–0.2)

## 2019-08-11 LAB — BASIC METABOLIC PANEL
Anion gap: 7 (ref 5–15)
Anion gap: 11 (ref 5–15)
BUN: 46 mg/dL — ABNORMAL HIGH (ref 8–23)
BUN: 40 mg/dL — ABNORMAL HIGH (ref 8–23)
CO2: 30 mmol/L (ref 22–32)
CO2: 26 mmol/L (ref 22–32)
Calcium: 8.5 mg/dL — ABNORMAL LOW (ref 8.9–10.3)
Calcium: 8.6 mg/dL — ABNORMAL LOW (ref 8.9–10.3)
Chloride: 118 mmol/L — ABNORMAL HIGH (ref 98–111)
Chloride: 115 mmol/L — ABNORMAL HIGH (ref 98–111)
Creatinine, Ser: 0.87 mg/dL (ref 0.61–1.24)
Creatinine, Ser: 0.91 mg/dL (ref 0.61–1.24)
GFR calc Af Amer: >60 mL/min (ref >60)
GFR calc Af Amer: >60 mL/min (ref >60)
GFR calc non Af Amer: >60 mL/min (ref >60)
GFR calc non Af Amer: >60 mL/min (ref >60)
Glucose, Bld: 125 mg/dL — ABNORMAL HIGH (ref 70–99)
Glucose, Bld: 106 mg/dL — ABNORMAL HIGH (ref 70–99)
Potassium: 3.4 mmol/L — ABNORMAL LOW (ref 3.5–5.1)
Potassium: 4.7 mmol/L (ref 3.5–5.1)
Sodium: 155 mmol/L — ABNORMAL HIGH (ref 135–145)
Sodium: 152 mmol/L — ABNORMAL HIGH (ref 135–145)

## 2019-08-11 LAB — GLUCOSE, CAPILLARY
Glucose-Capillary: 100 mg/dL — ABNORMAL HIGH (ref 70–99)
Glucose-Capillary: 101 mg/dL — ABNORMAL HIGH (ref 70–99)
Glucose-Capillary: 104 mg/dL — ABNORMAL HIGH (ref 70–99)
Glucose-Capillary: 86 mg/dL (ref 70–99)
Glucose-Capillary: 92 mg/dL (ref 70–99)
Glucose-Capillary: 95 mg/dL (ref 70–99)
Glucose-Capillary: 98 mg/dL (ref 70–99)

## 2019-08-11 LAB — MAGNESIUM: Magnesium: 2.6 mg/dL — ABNORMAL HIGH (ref 1.7–2.4)

## 2019-08-11 LAB — PHOSPHORUS: Phosphorus: 2.9 mg/dL (ref 2.5–4.6)

## 2019-08-11 LAB — SODIUM: Sodium: 152 mmol/L — ABNORMAL HIGH (ref 135–145)

## 2019-08-11 IMAGING — DX DG CHEST 1V PORT
1 series · 1 of 1 positions shown · non-contrast
Comparison: Single-view of the chest [DATE] and [DATE].

CLINICAL DATA: Respiratory distress. The patient was found down
[DATE].

EXAM:
PORTABLE CHEST 1 VIEW

[chest ap]
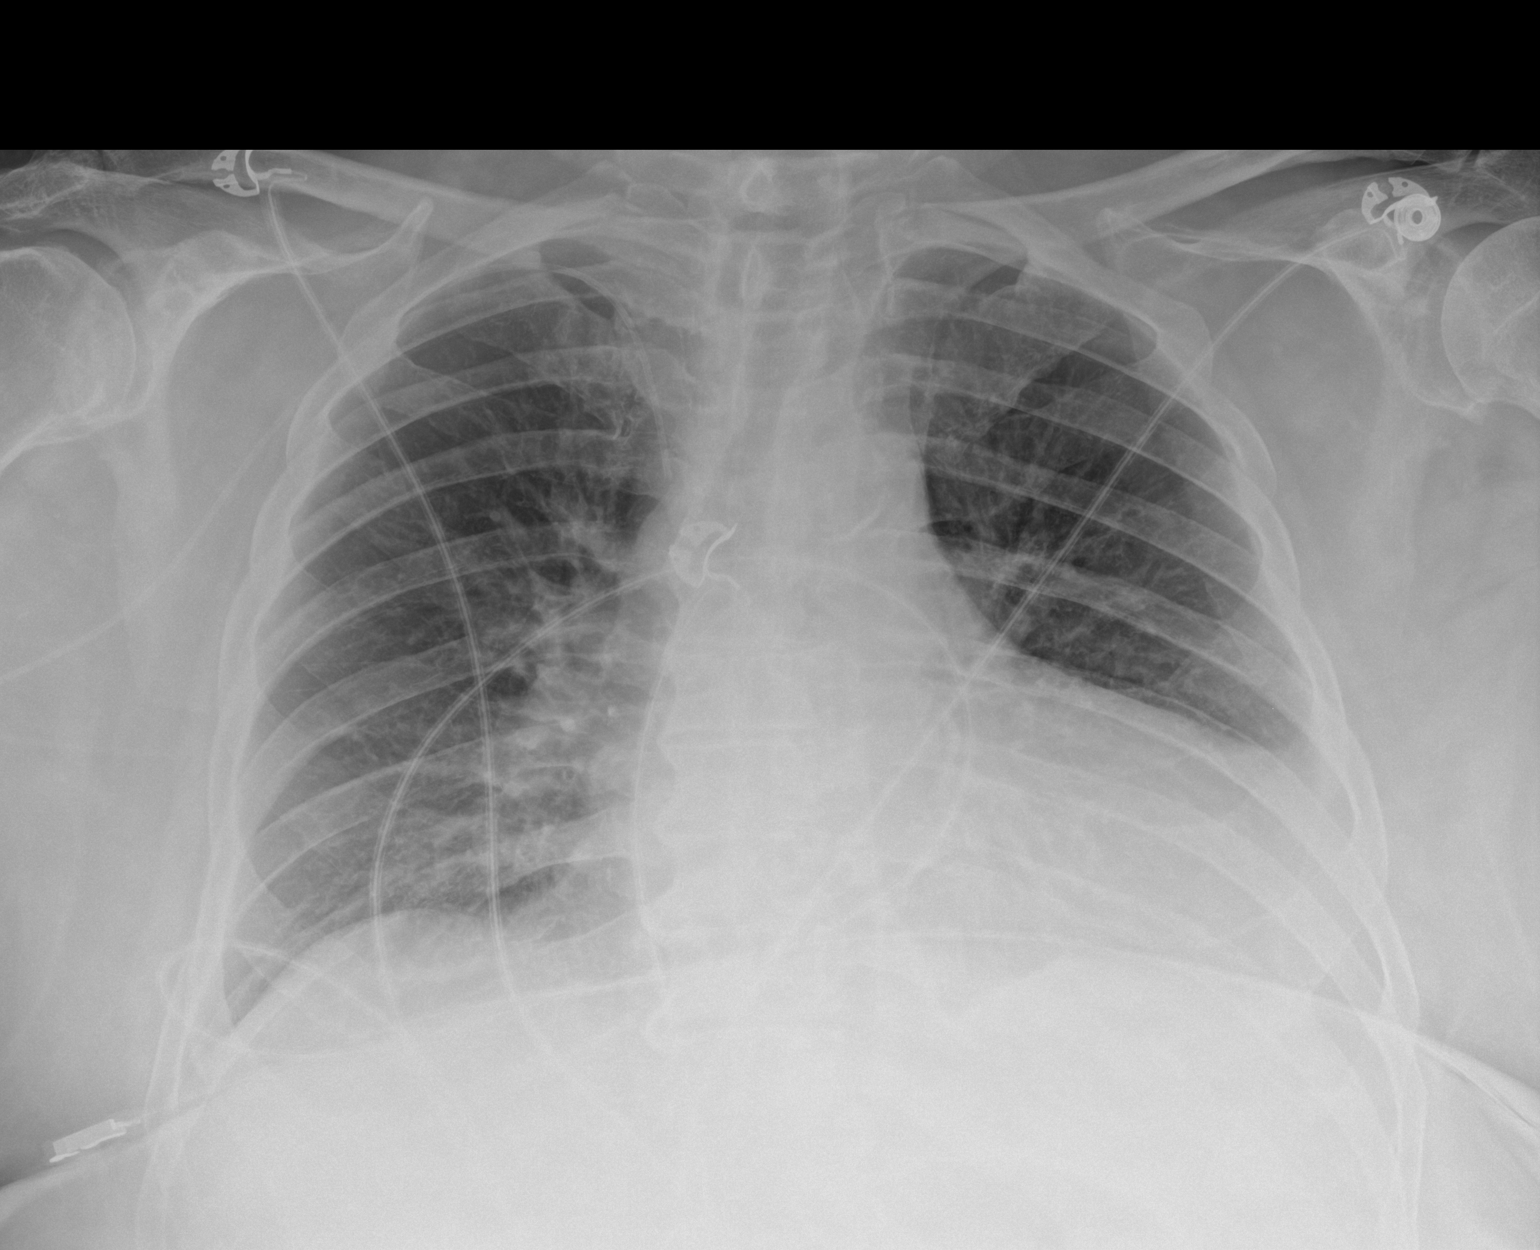

[1 of 1 positions shown; findings below may reference images not displayed]

FINDINGS: Endotracheal tube and NG tube have been removed. Right PICC is
unchanged. The right lung is clear. There may be a small left
pleural effusion and basilar atelectasis. No pneumothorax. Heart
size is upper normal. Atherosclerosis noted.
IMPRESSION: Status post extubation and NG tube removal.

Possible small left pleural effusion. No change in left basilar
atelectasis.

## 2019-08-11 MED ORDER — FENTANYL CITRATE (PF) 100 MCG/2ML IJ SOLN
INTRAMUSCULAR | Status: AC
  Filled 2019-08-11: qty 2

## 2019-08-11 MED ORDER — MAGNESIUM SULFATE 2 GM/50ML IV SOLN
2.0000 g | Freq: Once | INTRAVENOUS | Status: AC
  Administered 2019-08-11: 2 g via INTRAVENOUS
  Filled 2019-08-11: qty 50

## 2019-08-11 MED ORDER — FUROSEMIDE 10 MG/ML IJ SOLN
40.0000 mg | Freq: Two times a day (BID) | INTRAMUSCULAR | Status: DC
  Administered 2019-08-11: 09:00:00 40 mg via INTRAVENOUS
  Filled 2019-08-11: qty 4

## 2019-08-11 MED ORDER — VANCOMYCIN HCL 1500 MG/300ML IV SOLN
1500.0000 mg | INTRAVENOUS | Status: DC
  Administered 2019-08-11 – 2019-08-12 (×2): 1500 mg via INTRAVENOUS
  Filled 2019-08-11 (×3): qty 300

## 2019-08-11 MED ORDER — POTASSIUM CHLORIDE 20 MEQ PO PACK
40.0000 meq | PACK | Freq: Two times a day (BID) | ORAL | Status: DC
  Administered 2019-08-11: 10:00:00 40 meq via ORAL
  Filled 2019-08-11: qty 2

## 2019-08-11 MED ORDER — FENTANYL CITRATE (PF) 100 MCG/2ML IJ SOLN: 50.0000 ug | Freq: Once | INTRAMUSCULAR | Status: AC

## 2019-08-11 MED ORDER — FENTANYL CITRATE (PF) 100 MCG/2ML IJ SOLN
50.0000 ug | Freq: Once | INTRAMUSCULAR | Status: AC
  Administered 2019-08-11: 50 ug via INTRAVENOUS

## 2019-08-11 MED ORDER — POTASSIUM CHLORIDE 20 MEQ/15ML (10%) PO SOLN
20.0000 meq | ORAL | Status: AC
  Filled 2019-08-11 (×2): qty 15

## 2019-08-11 MED ORDER — FLUCONAZOLE 100MG IVPB
100.0000 mg | INTRAVENOUS | Status: DC
  Administered 2019-08-11 – 2019-08-12 (×2): 100 mg via INTRAVENOUS
  Filled 2019-08-11 (×3): qty 50

## 2019-08-11 MED ORDER — FENTANYL CITRATE (PF) 100 MCG/2ML IJ SOLN
INTRAMUSCULAR | Status: AC
  Administered 2019-08-11: 03:00:00 50 ug via INTRAVENOUS
  Filled 2019-08-11: qty 2

## 2019-08-11 MED ORDER — DESMOPRESSIN ACETATE 4 MCG/ML IJ SOLN
1.0000 ug | Freq: Two times a day (BID) | INTRAMUSCULAR | Status: DC
  Filled 2019-08-11 (×2): qty 1

## 2019-08-11 MED ORDER — FUROSEMIDE 10 MG/ML IJ SOLN
20.0000 mg | Freq: Every day | INTRAMUSCULAR | Status: DC
  Administered 2019-08-12: 12:00:00 20 mg via INTRAVENOUS
  Filled 2019-08-11: qty 2

## 2019-08-11 MED ORDER — DESMOPRESSIN ACETATE 4 MCG/ML IJ SOLN
0.2500 ug | Freq: Two times a day (BID) | INTRAMUSCULAR | Status: DC
  Administered 2019-08-11 (×2): 0.24 ug via INTRAVENOUS
  Filled 2019-08-11 (×3): qty 1

## 2019-08-11 NOTE — Progress Notes (Signed)
CRITICAL CARE PROGRESS NOTE    Name: Iker Nuttall MRN: [id number] DOB: 24-Mar-1946     LOS: 86   SUBJECTIVE FINDINGS & SIGNIFICANT EVENTS   Patient description: Mr. Deeg is a 74 yo male with a history of alcohol abuse, tobacco abuse and suspected COPD who presented to the ED on 07/27/2019 as a [name] after being found down by EMS.  He was admitted to the ICU in acute respiratory distress, intubated and sedated.  On further workup, he was found to have a grade I diastolic dysfunction, moderate aortic stenosis and AKI.     Lines / Drains: Midline single lumen left cephalic PIV right forearm NG/OG tube Urethral catheter Rectal tube ET tube x 9 days  Cultures / Sepsis markers: Respiratory culture 1/11 consistent with normal respiratory flora.  Negative for MRSA, Influenza A/B, Sars-cov-2.  Blood cultures 1/8 showed no growth at 5 days.    Patient remains afebrile at 98.57F.     Protocols / Consultants: CIWA protocol  Tests / Events: 1/9 severe resp failure acute sCHF, intubated 1/8 1/10 SAT/SBT pending, +UTI continue ABX 1/11 patient failed wean due to agitation and increased work of breathing 1/12 patient failed weaning trial 1/13 bradycardia at 48 bpm. No elevation in troponin. Family discussion-patient is DNR  1/14 failed SAT/SBT due to severe delirium and WOB 1/16 Slow to awaken but no asynchrony with sedation vacation 1/17 developed A. fib flutter with controlled ventricular response 1/18 patient remains in anasarca - now on lasix gtt 1/19 MRI brain with no acute CVA but + right temporal encephalomalacia and a meningioma found incidentally 1/20- patient continues to have altered mentation and fever this am. Repeating septic workup.  EEG today. MRI brain yesterday no acute findings. Neuro  consultation 1/21-patient mildly improved in terms of mentation, discussed case with neurology today.  Possible LP 1/22- patient is improving wbc count normalized, mentation improved.  Discussed with Dr Doy Mince. Hypernatremia is refractory to 350W push q4h via OGT, will do one time DDAVP challenge with repeat BMP 1/23-patient has passed weanig trial , mentation improved.  Successfully weaned from MV.     PAST MEDICAL HISTORY   Past Medical History:  Diagnosis Date  . ETOH abuse   . Smoker      SURGICAL HISTORY   History reviewed. No pertinent surgical history.   FAMILY HISTORY   History reviewed. No pertinent family history.   SOCIAL HISTORY   Social History   Tobacco Use  . Smoking status: Current Every Day Smoker    Packs/day: 0.50    Years: 55.00    Pack years: 27.50    Types: Cigarettes  . Smokeless tobacco: Never Used  Substance Use Topics  . Alcohol use: Never  . Drug use: Never     MEDICATIONS   Current Medication:  Current Facility-Administered Medications:  .  0.9 %  sodium chloride infusion, 250 mL, Intravenous, PRN, Flora Lipps, MD, Stopped at 08/09/19 0845 .  acetaminophen (TYLENOL) tablet 650 mg, 650 mg, Oral, Q4H PRN, Flora Lipps, MD, 650 mg at 08/08/19 2044 .  amLODipine (NORVASC) tablet 10 mg, 10 mg, Per Tube, Daily, Awilda Bill, NP, 10 mg at 08/11/19 1005 .  cefTRIAXone (ROCEPHIN) 2 g in sodium chloride 0.9 % 100 mL IVPB, 2 g, Intravenous, Q12H, Atsushi Yom, MD, Last Rate: 200 mL/hr at 08/11/19 1033, 2 g at 08/11/19 1033 .  chlorhexidine gluconate (MEDLINE KIT) (PERIDEX) 0.12 % solution 15 mL, 15 mL, Mouth Rinse, BID, Kasa, Kurian,  MD, 15 mL at 08/11/19 0800 .  Chlorhexidine Gluconate Cloth 2 % PADS 6 each, 6 each, Topical, Daily, Flora Lipps, MD, 6 each at 08/10/19 2300 .  desmopressin (DDAVP) injection 0.24 mcg, 0.24 mcg, Intravenous, BID, Oswald Hillock, RPH .  enoxaparin (LOVENOX) injection 40 mg, 40 mg, Subcutaneous, Q24H,  Hallaji, Sheema M, RPH, 40 mg at 08/11/19 1001 .  famotidine (PEPCID) tablet 20 mg, 20 mg, Per Tube, BID, Flora Lipps, MD, 20 mg at 08/11/19 1005 .  feeding supplement (PRO-STAT SUGAR FREE 64) liquid 60 mL, 60 mL, Per Tube, BID, Rhiana Morash, MD, 60 mL at 08/11/19 1006 .  feeding supplement (VITAL HIGH PROTEIN) liquid 1,000 mL, 1,000 mL, Per Tube, Q24H, Verlie Liotta, MD, 1,000 mL at 08/10/19 1625 .  folic acid (FOLVITE) tablet 1 mg, 1 mg, Per Tube, Daily, Kasa, Kurian, MD, 1 mg at 08/11/19 1005 .  free water 400 mL, 400 mL, Per Tube, Q4H, Ottie Glazier, MD, Stopped at 08/11/19 0805 .  furosemide (LASIX) injection 40 mg, 40 mg, Intravenous, BID, Lanney Gins, Ilee Randleman, MD, 40 mg at 08/11/19 0921 .  hydrALAZINE (APRESOLINE) injection 20 mg, 20 mg, Intravenous, Q4H PRN, Tyler Pita, MD, 20 mg at 08/08/19 0304 .  insulin aspart (novoLOG) injection 0-15 Units, 0-15 Units, Subcutaneous, Q4H, Charlett Nose, RPH, 1 Units at 08/09/19 2337 .  ipratropium-albuterol (DUONEB) 0.5-2.5 (3) MG/3ML nebulizer solution 3 mL, 3 mL, Nebulization, Q6H, Lanney Gins, Ellionna Buckbee, MD, 3 mL at 08/11/19 0740 .  liver oil-zinc oxide (DESITIN) 40 % ointment, , Topical, BID, Ottie Glazier, MD, Given at 08/10/19 2109 .  MEDLINE mouth rinse, 15 mL, Mouth Rinse, 10 times per day, Flora Lipps, MD, 15 mL at 08/11/19 1006 .  metoprolol tartrate (LOPRESSOR) injection 2.5-5 mg, 2.5-5 mg, Intravenous, Q4H PRN, Tyler Pita, MD, 5 mg at 08/08/19 0436 .  multivitamin liquid 15 mL, 15 mL, Oral, Daily, Kasa, Kurian, MD, 15 mL at 08/11/19 1006 .  nitroGLYCERIN (NITROGLYN) 2 % ointment 0.5 inch, 0.5 inch, Topical, Q6H, Blakeney, Dreama Saa, NP, 0.5 inch at 08/11/19 0520 .  nystatin (MYCOSTATIN) 100000 UNIT/ML suspension 500,000 Units, 5 mL, Oral, QID, Lanney Gins, Delecia Vastine, MD, 500,000 Units at 08/10/19 2109 .  ondansetron (ZOFRAN) injection 4 mg, 4 mg, Intravenous, Q6H PRN, Mortimer Fries, Kurian, MD .  potassium chloride (KLOR-CON) packet 40 mEq,  40 mEq, Oral, BID, Lanney Gins, Keyler Hoge, MD, 40 mEq at 08/11/19 1004 .  potassium chloride 20 MEQ/15ML (10%) solution 20 mEq, 20 mEq, Per Tube, Q4H, Blakeney, Dana G, NP .  sodium chloride flush (NS) 0.9 % injection 10-40 mL, 10-40 mL, Intracatheter, PRN, Flora Lipps, MD, 10 mL at 08/03/19 0929 .  [COMPLETED] thiamine 559m in normal saline (526m IVPB, 500 mg, Intravenous, Daily, Last Rate: 100 mL/hr at 08/05/19 1033, 500 mg at 08/05/19 1033 **FOLLOWED BY** thiamine tablet 100 mg, 100 mg, Per Tube, Daily, Kasa, Kurian, MD, 100 mg at 08/11/19 1005 .  vancomycin (VANCOREADY) IVPB 1500 mg/300 mL, 1,500 mg, Intravenous, Q24H, PaOswald HillockRPWausau Surgery Center  ALLERGIES   Patient has no allergy information on record.    REVIEW OF SYSTEMS    Unable to obtain due to severity of patient condition.  PHYSICAL EXAMINATION   Vital Signs: Temp:  [98.5 F (36.9 C)-99.1 F (37.3 C)] 99 F (37.2 C) (01/23 0800) Pulse Rate:  [65-92] 88 (01/23 1100) Resp:  [20-28] 26 (01/23 1100) BP: (123-161)/(33-95) 145/52 (01/23 1100) SpO2:  [90 %-100 %] 96 % (01/23 1100) FiO2 (%):  [28 %-  40 %] 28 % (01/23 0740) Weight:  [108.9 kg] 108.9 kg (01/23 0500)  GENERAL:Patient is an ill-appearing obese man.  Intubated HEAD: Normocephalic, atraumatic.  EYES: Pupils equal, round, slowly reactive to light.  No scleral icterus.  MOUTH: Moist mucosal membrane. NECK: Supple. No thyromegaly. No nodules. No JVD.  PULMONARY: Wheezing and rhonchi present bilaterally diminished in bilateral lower lobes CARDIOVASCULAR: S1 and S2. Regular rate and rhythm. Distant heart sounds.  Systolic murmur at right upper sternal border consistent with aortic stenosis. GASTROINTESTINAL: Soft, nontender, non-distended. No masses. Positive bowel sounds. No hepatosplenomegaly.  MUSCULOSKELETAL: Edema in bilateral fingers.   NEUROLOGIC: Obtunded.  Pupils slowly reactive. Corneal reflex sluggish.  Cough reflex intact.  Patient does not follow commands.   Makes non-purposeful movements of extremities.   SKIN:intact,warm,dry   PERTINENT DATA     Infusions: . sodium chloride Stopped (08/09/19 0845)  . cefTRIAXone (ROCEPHIN)  IV 2 g (08/11/19 1033)  . vancomycin     Scheduled Medications: . amLODipine  10 mg Per Tube Daily  . chlorhexidine gluconate (MEDLINE KIT)  15 mL Mouth Rinse BID  . Chlorhexidine Gluconate Cloth  6 each Topical Daily  . desmopressin  0.24 mcg Intravenous BID  . enoxaparin (LOVENOX) injection  40 mg Subcutaneous Q24H  . famotidine  20 mg Per Tube BID  . feeding supplement (PRO-STAT SUGAR FREE 64)  60 mL Per Tube BID  . feeding supplement (VITAL HIGH PROTEIN)  1,000 mL Per Tube Q24H  . folic acid  1 mg Per Tube Daily  . free water  400 mL Per Tube Q4H  . furosemide  40 mg Intravenous BID  . insulin aspart  0-15 Units Subcutaneous Q4H  . ipratropium-albuterol  3 mL Nebulization Q6H  . liver oil-zinc oxide   Topical BID  . mouth rinse  15 mL Mouth Rinse 10 times per day  . multivitamin  15 mL Oral Daily  . nitroGLYCERIN  0.5 inch Topical Q6H  . nystatin  5 mL Oral QID  . potassium chloride  40 mEq Oral BID  . potassium chloride  20 mEq Per Tube Q4H  . thiamine  100 mg Per Tube Daily   PRN Medications: sodium chloride, acetaminophen, hydrALAZINE, metoprolol tartrate, ondansetron (ZOFRAN) IV, sodium chloride flush Hemodynamic parameters:   Intake/Output: 01/22 0701 - 01/23 0700 In: 1430 [NG/GT:1080; IV Piggyback:350] Out: 1975 [NOIBB:0488; QBVQX:450]  Ventilator  Settings: Vent Mode: PSV;CPAP FiO2 (%):  [28 %-40 %] 28 % PEEP:  [5 cmH20] 5 cmH20 Pressure Support:  [10 cmH20] 10 cmH20 Plateau Pressure:  [12 cmH20] 12 cmH20   LAB RESULTS:  Basic Metabolic Panel: Recent Labs  Lab 08/06/19 0732 08/06/19 0732 08/07/19 0359 08/07/19 0359 08/08/19 0927 08/08/19 0927 08/09/19 0438 08/09/19 0438 08/10/19 0406 08/10/19 0406 08/10/19 1402 08/11/19 0406  NA 149*   < > 152*   < > 153*  --  154*  --   154*  --  150* 155*  K 4.4   < > 3.6   < > 3.6   < > 2.8*   < > 3.0*   < > 3.6 3.4*  CL 115*   < > 114*   < > 115*  --  114*  --  115*  --  113* 118*  CO2 23   < > 29   < > 27  --  32  --  31  --  29 30  GLUCOSE 147*   < > 111*   < > 140*  --  142*  --  120*  --  137* 125*  BUN 58*   < > 54*   < > 57*  --  60*  --  51*  --  51* 46*  CREATININE 1.10   < > 1.01   < > 1.16  --  0.97  --  0.95  --  0.96 0.87  CALCIUM 9.0   < > 8.8*   < > 8.6*  --  8.6*  --  8.1*  --  8.2* 8.5*  MG 2.6*  --  2.1  --  2.6*  --   --   --   --   --   --  2.6*  PHOS 3.3  --   --   --   --   --   --   --   --   --   --  2.9   < > = values in this interval not displayed.   Liver Function Tests: Recent Labs  Lab 08/08/19 0927  AST 87*  ALT 92*  ALKPHOS 55  BILITOT 0.8  PROT 6.6  ALBUMIN 2.9*   No results for input(s): LIPASE, AMYLASE in the last 168 hours. Recent Labs  Lab 08/08/19 1234  AMMONIA 64*   CBC: Recent Labs  Lab 08/06/19 0732 08/07/19 0359 08/09/19 0438 08/10/19 0406 08/11/19 0406  WBC 12.3* 11.5* 13.7* 10.4 10.3  NEUTROABS 9.8* 8.6*  --   --  7.6  HGB 14.0 14.1 12.5* 11.7* 11.7*  HCT 43.9 43.0 40.7 38.4* 37.9*  MCV 98.9 97.7 102.3* 102.9* 103.0*  PLT 257 282 242 254 249   Cardiac Enzymes: No results for input(s): CKTOTAL, CKMB, CKMBINDEX, TROPONINI in the last 168 hours. BNP: Invalid input(s): POCBNP CBG: Recent Labs  Lab 08/10/19 1955 08/10/19 2345 08/11/19 0338 08/11/19 0738 08/11/19 1130  GLUCAP 108* 98 104* 101* 100*     IMAGING RESULTS:  Imaging: DG Chest Port 1 View  Result Date: 08/10/2019 CLINICAL DATA:  Shortness of breath EXAM: PORTABLE CHEST 1 VIEW COMPARISON:  August 08, 2019 FINDINGS: The right-sided PICC line tip terminates over the right brachiocephalic vein. The endotracheal tube terminates above the carina. The enteric tube extends below the left hemidiaphragm. There is no pneumothorax. There may be a small left-sided pleural effusion. Bibasilar  atelectasis is suspected. IMPRESSION: 1. The right-sided PICC line appears to have retracted slightly with the tip now residing over the brachiocephalic vein. 2. The remaining lines and tubes are as above. 3. Otherwise, no significant interval change. Electronically Signed   By: Constance Holster M.D.   On: 08/10/2019 02:07        ASSESSMENT AND PLAN    -Multidisciplinary rounds held today  Acute Hypoxic Respiratory Failure  -Due to acute decompensated systolic CHF as well as possible pneumonia since patient has been febrile with leukocytosis. 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 - now on BID desmppressin     - q4h Na monitoring -Discussed case with neurology-Dr. Rick Duff input -Status post EEG -Status post MRI brain no acute findings -will perform ACTH stim test- normal steroid response -TSH is low, subclinical hyperthyroidism due to acutely ill state  Oral thrush   - refusing nystatin swish and swallow   - IV diflucan x 5d  CARDIAC  FAILURE with grade I diastolic dysfunction -oxygen as needed -Lasix as tolerated -follow up cardiac enzymes as indicated - Continue topical nitroglycerin ICU monitoring  Hypertension - BP continues to stay in the 175/37 mmHg range. 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  Is not following commands.  - Due to mental status, hypertension, and widened pulse pressure, consider MRI head.   GI/Nutrition GI PROPHYLAXIS as indicated DIET-->Continue TF's as tolerated Patient continues to have loose bowel movements- rectal tube in place.  Hold stool softeners.     ENDO - ICU hypoglycemic\Hyperglycemia protocol -check FSBS per protocol - Continue insulin sliding scale  Intertrigo in groin - Begin application of hydrocerin cream BID. - Will continue nursing wound care and bedding changes to reduce the risk of further breakdown.   Goals of care - Patient DNR status  - Patient has been intubated for 9 days.  Plan family meeting today to discuss any further intubation in future   ELECTROLYTES -follow labs as needed -replace as needed  -pharmacy consultation   DVT/GI PRX ordered - Lovenox 40 mg QD for DVT prophylaxis TRANSFUSIONS AS NEEDED MONITOR FSBS ASSESS the need for LABS as needed   Critical care provider statement:    Critical care time (minutes):  34   Critical care time was exclusive of:  Separately billable procedures and treating other patients   Critical care was necessary to treat or prevent imminent or life-threatening deterioration of the following conditions:  Acute respiratory failure, Delerium tremens   Critical care was time spent personally by me on the following activities:  Development of treatment plan with patient or surrogate, discussions with consultants, evaluation of patient's response to treatment, examination of patient, obtaining history from patient or surrogate, ordering and performing treatments and interventions, ordering and review of laboratory studies  and re-evaluation of patient's condition.  I assumed direction of critical care for this patient from another provider in my specialty: no    Patient remains critically ill with multi-organ failure.  At risk of developing cardiac arrest and death. Patient currently DNR status.    Ottie Glazier, M.D.  Division of [facility]

## 2019-08-11 NOTE — Progress Notes (Signed)
Patient failed swallow evaluation.  It can be repeated later if assessment warrants it.  (MD order Dr. Lanney Gins).   Phillis Knack, RN

## 2019-08-11 NOTE — Consult Note (Addendum)
PHARMACY CONSULT NOTE - FOLLOW UP  Pharmacy Consult for Electrolyte Monitoring and Replacement   Antonio [name] is a morbidly obese 74 y.o. male admitted on 07/27/2019. EMS stated that he collapsed on the stretcher and became unresponsive. Pt was apneic, but still had a pulse. Pt has AKI, severe respiratory distress, and acute respiratory failure. Pt is intubated on mechanical ventilation. Per am rounds, desmopressin will be initiated to reduce sodium levels, along with free water flushes. Pharmacy consulted to assist with replacing and monitoring electrolytes.   Recent Labs: Potassium (mmol/L)  Date Value  08/11/2019 3.4 (L)   Magnesium (mg/dL)  Date Value  08/11/2019 2.6 (H)   Calcium (mg/dL)  Date Value  08/11/2019 8.5 (L)   Albumin (g/dL)  Date Value  08/08/2019 2.9 (L)   Phosphorus (mg/dL)  Date Value  08/11/2019 2.9   Sodium (mmol/L)  Date Value  08/11/2019 155 (H)    Assessment: 1. Electrolytes: No changes in sodium levels since yesterday, despite the free water flushes. MD recommended desmopressin x 1 on 1/22 to help reduce sodium levels along with free water flushes. Starting DDAVP BID per Dr. Loni Muse. ADH lab ordered.  Potassium levels still low and will provider to replace with 22mEq x 2 doses. Continue monitoring electrolytes in am labs. Will replace to achieve goal potassium ~4, sodium ~140, and magnesium ~2.    2. Glucose: Today's range: 104-125. Glucose levels are lower than yesterday. Glucose-capillary monitored Q4H. Continue insulin aspart 0-15 [place] Q4H and monitor glucose daily.   3. Constipation: Last BM: 01/22. Continue monitoring BM and add constipation-relief meds if constipation recurs.    Oswald Hillock PharmD, BCPS Clinical Pharmacist 08/11/2019 9:43 AM

## 2019-08-11 NOTE — Progress Notes (Signed)
Patient became agitated and tried to climb out of bed 0300. On call provider notified and a 92mcg of 50 fentanyl given.  Patient now currently resting and seems less agitated. Will continue to monitor patient.

## 2019-08-11 NOTE — Progress Notes (Signed)
Patient extubated at 1217 by RT and RN.  Placed on 2Lpm O2 via [place].

## 2019-08-11 NOTE — Progress Notes (Addendum)
Patient refuses all oral care and his oral medication for his tongue.  MD was notified.  MD added an IV medicine.  Education was done.    Patient stated "Let me alone." (He spoke these words while RN washed his face and tried to do oral care).  June Leap Koren Bound, RN

## 2019-08-11 NOTE — Progress Notes (Signed)
Pharmacy Antibiotic Note  Antonio [name] is a morbidly obese 74 y.o. male admitted on 07/27/2019. EMS stated that he collapsed on the stretcher and became unresponsive. Pt was apneic, but still had a pulse. Pt has AKI, severe respiratory distress, and acute respiratory failure. Pt is intubated on mechanical ventilation. Pt has afebrile and WBCs are WNL. Ceftriaxone was started yesterday for possible pneumonia, but now need to increase the dose for CNS dosing for possible meningitis stated by neurologist. No plan for LP at this time per neuro. Pharmacy has been consulted for ceftriaxone and vancomyin dosing.  Plan: Change ceftriaxone dosing to 2g IV Q12H (for CNS coverage)  Increase vancomycin 1500mg  IV Q24H. Monitor renal function with am labs and adjust dose accordingly. Will plan for levels on Monday if vanc to continue. Will de-escalate per respiratory culture. Will dose vanc based on treatment for pnuemonia. Predicted AUC 442. With a goal AUC of 400-550. Used Vd coefficient of 0.5 due to BMI. Scr used 0.87.   Height: 5' 7.01" (170.2 cm) Weight: 240 lb 1.3 oz (108.9 kg) IBW/kg (Calculated) : 66.12  Temp (24hrs), Avg:98.9 F (37.2 C), Min:98.5 F (36.9 C), Max:99.1 F (37.3 C)  Recent Labs  Lab 08/06/19 0732 08/06/19 0732 08/07/19 0359 08/07/19 0359 08/08/19 0927 08/09/19 0438 08/10/19 0406 08/10/19 1402 08/11/19 0406  WBC 12.3*  --  11.5*  --   --  13.7* 10.4  --  10.3  CREATININE 1.10   < > 1.01   < > 1.16 0.97 0.95 0.96 0.87   < > = values in this interval not displayed.    Estimated Creatinine Clearance: 87.7 mL/min (by C-G formula based on SCr of 0.87 mg/dL).    Not on File  Antimicrobials this admission: Ciprofloxacin 400mg  IV 01/10 >> 01/11 Ceftriaxone 1g IV 01/11 >> 01/12 Unasyn 3g IV 01/13 >> 01/15 Zosyn 3.375mg  IV 01/20 >> 01/21 Vancomycin 1250mg  IV (+loading dose on day 1) 01/20 >>  Ceftriaxone 2g IV 01/21 >>   Dose adjustments this  admission: NA  Microbiology results: 01/20 BCx: No growth for 2 days 01/20 BCx: No growth for 2 days 01/20 Respiratory Cx: S. Aureus, susceptibilities to follow; cx reincubated for better growth   01/11 Sputum: Consistent with normal respiratory flora  01/20 MRSA PCR: Negative 01/08 SARS Coronavirus PCR: Negative 01/08 Influenza A/B PCR: Negative   Thank you for allowing pharmacy to be a part of this patient's care.  Oswald Hillock, PharmD, BCPS 08/11/2019 9:53 AM

## 2019-08-12 LAB — SODIUM
Sodium: 152 mmol/L — ABNORMAL HIGH (ref 135–145)
Sodium: 155 mmol/L — ABNORMAL HIGH (ref 135–145)
Sodium: 165 mmol/L (ref 135–145)
Sodium: 147 mmol/L — ABNORMAL HIGH (ref 135–145)
Sodium: 155 mmol/L — ABNORMAL HIGH (ref 135–145)

## 2019-08-12 LAB — BASIC METABOLIC PANEL
Anion gap: 6 (ref 5–15)
BUN: 37 mg/dL — ABNORMAL HIGH (ref 8–23)
CO2: 31 mmol/L (ref 22–32)
Calcium: 8.4 mg/dL — ABNORMAL LOW (ref 8.9–10.3)
Chloride: 116 mmol/L — ABNORMAL HIGH (ref 98–111)
Creatinine, Ser: 0.79 mg/dL (ref 0.61–1.24)
GFR calc Af Amer: >60 mL/min (ref >60)
GFR calc non Af Amer: >60 mL/min (ref >60)
Glucose, Bld: 92 mg/dL (ref 70–99)
Potassium: 3.8 mmol/L (ref 3.5–5.1)
Sodium: 153 mmol/L — ABNORMAL HIGH (ref 135–145)

## 2019-08-12 LAB — CBC WITH DIFFERENTIAL/PLATELET
Abs Immature Granulocytes: 0.08 10*3/uL — ABNORMAL HIGH (ref 0.00–0.07)
Basophils Absolute: 0.1 10*3/uL (ref 0.0–0.1)
Basophils Relative: 1 %
Eosinophils Absolute: 0.3 10*3/uL (ref 0.0–0.5)
Eosinophils Relative: 4 %
HCT: 39.2 % (ref 39.0–52.0)
Hemoglobin: 11.8 g/dL — ABNORMAL LOW (ref 13.0–17.0)
Immature Granulocytes: 1 %
Lymphocytes Relative: 15 %
Lymphs Abs: 1.3 10*3/uL (ref 0.7–4.0)
MCH: 31.3 pg (ref 26.0–34.0)
MCHC: 30.1 g/dL (ref 30.0–36.0)
MCV: 104 fL — ABNORMAL HIGH (ref 80.0–100.0)
Monocytes Absolute: 0.6 10*3/uL (ref 0.1–1.0)
Monocytes Relative: 7 %
Neutro Abs: 6 10*3/uL (ref 1.7–7.7)
Neutrophils Relative %: 72 %
Platelets: 262 10*3/uL (ref 150–400)
RBC: 3.77 MIL/uL — ABNORMAL LOW (ref 4.22–5.81)
RDW: 13.6 % (ref 11.5–15.5)
WBC: 8.3 10*3/uL (ref 4.0–10.5)
nRBC: 0 % (ref 0.0–0.2)

## 2019-08-12 LAB — CULTURE, RESPIRATORY W GRAM STAIN
Culture: NORMAL
Gram Stain: NONE SEEN

## 2019-08-12 LAB — GLUCOSE, CAPILLARY
Glucose-Capillary: 88 mg/dL (ref 70–99)
Glucose-Capillary: 88 mg/dL (ref 70–99)
Glucose-Capillary: 91 mg/dL (ref 70–99)
Glucose-Capillary: 93 mg/dL (ref 70–99)
Glucose-Capillary: 93 mg/dL (ref 70–99)
Glucose-Capillary: 98 mg/dL (ref 70–99)

## 2019-08-12 LAB — VANCOMYCIN, PEAK: Vancomycin Pk: 45 ug/mL — ABNORMAL HIGH (ref 30–40)

## 2019-08-12 MED ORDER — SODIUM CHLORIDE 0.9 % IV SOLN
20.0000 ug | Freq: Once | INTRAVENOUS | Status: AC
  Administered 2019-08-12: 14:00:00 20 ug via INTRAVENOUS
  Filled 2019-08-12: qty 5

## 2019-08-12 MED ORDER — MAGNESIUM SULFATE 2 GM/50ML IV SOLN
2.0000 g | Freq: Once | INTRAVENOUS | Status: AC
  Administered 2019-08-12: 2 g via INTRAVENOUS
  Filled 2019-08-12: qty 50

## 2019-08-12 MED ORDER — POTASSIUM CHLORIDE 20 MEQ PO PACK: 40.0000 meq | PACK | Freq: Once | ORAL | Status: DC

## 2019-08-12 MED ORDER — FUROSEMIDE 10 MG/ML IJ SOLN
40.0000 mg | Freq: Every day | INTRAMUSCULAR | Status: DC
  Administered 2019-08-13 – 2019-08-14 (×2): 40 mg via INTRAVENOUS
  Filled 2019-08-12 (×3): qty 4

## 2019-08-12 MED ORDER — DESMOPRESSIN ACETATE 4 MCG/ML IJ SOLN
0.5000 ug | Freq: Three times a day (TID) | INTRAMUSCULAR | Status: DC
  Administered 2019-08-12 (×3): 0.52 ug via INTRAVENOUS
  Filled 2019-08-12 (×7): qty 1

## 2019-08-12 NOTE — Consult Note (Signed)
PHARMACY CONSULT NOTE - FOLLOW UP  Pharmacy Consult for Electrolyte Monitoring and Replacement   Antonio [name] is a morbidly obese 74 y.o. male admitted on 07/27/2019. EMS stated that he collapsed on the stretcher and became unresponsive. Pt was apneic, but still had a pulse. Pt has AKI, severe respiratory distress, and acute respiratory failure. Pt is extubated and improving. Desmopressin will be initiated to reduce sodium levels, along with free water flushes. Pharmacy consulted to assist with replacing and monitoring electrolytes.   Recent Labs: Potassium (mmol/L)  Date Value  08/12/2019 3.8   Magnesium (mg/dL)  Date Value  08/11/2019 2.6 (H)   Calcium (mg/dL)  Date Value  08/12/2019 8.4 (L)   Albumin (g/dL)  Date Value  08/08/2019 2.9 (L)   Phosphorus (mg/dL)  Date Value  08/11/2019 2.9   Sodium (mmol/L)  Date Value  08/12/2019 155 (H)    Assessment: 1. Electrolytes: No changes in sodium levels since yesterday, despite the free water flushes. MD recommended desmopressin x 1 on 1/22 to help reduce sodium levels along with free water flushes. Starting DDAVP BID per Dr. Loni Muse on 1/23. ADH lab ordered - in process. Team plans to give pt DDAVP 20 mg x 1 on 1/24 and increased DDAVP to 0.52 mg TID.  Continue monitoring electrolytes in am labs. Will give Kcl 40 mEq x 1 PO. Will replace to achieve goal potassium ~4, sodium ~140, and magnesium ~2.   1/24: Na @ 0700 = 155            Na @ 1200 = 165           Na @ 1600 = 147         - notified Dr Lanney Gins of rapid drop in Na, he feels this reflects DDAVP 20 mcg given @ 1336.  Will recheck Na @ 2000.  If Na continues to drop, will hold DDAVP.   1/24: Na @ 2000 = 155    -  Na has bounced back to where it was this AM @ 0700 ; effectively, there has been a zero net change in Na in past 13 hrs.   Will continue with DDAVP and monitor Na Q4H.    2. Glucose: Today's range: 88-92. Glucose levels are lower than yesterday. Glucose-capillary  monitored Q4H. Continue insulin aspart 0-15 [place] Q4H and monitor glucose daily.   3. Constipation: Last BM: 01/23. Continue monitoring BM and add constipation-relief meds if constipation recurs.    Violeta Gelinas D PharmD, BCPS Clinical Pharmacist 08/12/2019 8:37 PM

## 2019-08-12 NOTE — Consult Note (Signed)
PHARMACY CONSULT NOTE - FOLLOW UP  Pharmacy Consult for Electrolyte Monitoring and Replacement   Antonio [name] is a morbidly obese 74 y.o. male admitted on 07/27/2019. EMS stated that he collapsed on the stretcher and became unresponsive. Pt was apneic, but still had a pulse. Pt has AKI, severe respiratory distress, and acute respiratory failure. Pt is intubated on mechanical ventilation. Per am rounds, desmopressin will be initiated to reduce sodium levels, along with free water flushes. Pharmacy consulted to assist with replacing and monitoring electrolytes.   Recent Labs: Potassium (mmol/L)  Date Value  08/11/2019 4.7   Magnesium (mg/dL)  Date Value  08/11/2019 2.6 (H)   Calcium (mg/dL)  Date Value  08/11/2019 8.6 (L)   Albumin (g/dL)  Date Value  08/08/2019 2.9 (L)   Phosphorus (mg/dL)  Date Value  08/11/2019 2.9   Sodium (mmol/L)  Date Value  08/11/2019 152 (H)    Assessment: 1. Electrolytes: No changes in sodium levels since yesterday, despite the free water flushes. MD recommended desmopressin x 1 on 1/22 to help reduce sodium levels along with free water flushes. Starting DDAVP BID per Dr. Loni Muse. ADH lab ordered.  Potassium levels still low and will provider to replace with 48mEq x 2 doses. Continue monitoring electrolytes in am labs. Will replace to achieve goal potassium ~4, sodium ~140, and magnesium ~2.    2. Glucose: Today's range: 104-125. Glucose levels are lower than yesterday. Glucose-capillary monitored Q4H. Continue insulin aspart 0-15  Q4H and monitor glucose daily.   3. Constipation: Last BM: 01/22. Continue monitoring BM and add constipation-relief meds if constipation recurs.   1/23 @ 2320, Na = 152 (no change from previous lab)  Hart Robinsons A PharmD Clinical Pharmacist 08/12/2019 12:08 AM

## 2019-08-12 NOTE — Progress Notes (Signed)
Pharmacy Antibiotic Note  Antonio [name] is a morbidly obese 74 y.o. male admitted on 07/27/2019. EMS stated that he collapsed on the stretcher and became unresponsive. Pt was apneic, but still had a pulse. Pt has AKI, severe respiratory distress, and acute respiratory failure. Pt is intubated on mechanical ventilation. Pt has afebrile and WBCs are WNL. Ceftriaxone was started yesterday for possible pneumonia, but now need to increase the dose for CNS dosing for possible meningitis stated by neurologist. No plan for LP at this time per neuro. Pharmacy has been consulted for ceftriaxone and vancomyin dosing.  Plan: Day 5 of abx. Change ceftriaxone dosing to 2g IV Q12H (for CNS coverage)  Increase vancomycin 1500mg  IV Q24H. Monitor renal function with am labs and adjust dose accordingly. Will plan for levels on Monday if vanc to continue. Will de-escalate per respiratory culture. Will dose vanc based on treatment for pnuemonia. Predicted AUC 442. With a goal AUC of 400-550. Used Vd coefficient of 0.5 due to BMI. Scr used 0.87. Tracheal aspirate growing rare staphylococcus aureus. Peak and trough levels ordered.   Height: 5' 7.01" (170.2 cm) Weight: 238 lb 1.6 oz (108 kg) IBW/kg (Calculated) : 66.12  Temp (24hrs), Avg:99 F (37.2 C), Min:98.6 F (37 C), Max:99.2 F (37.3 C)  Recent Labs  Lab 08/07/19 0359 08/08/19 0927 08/09/19 0438 08/09/19 0438 08/10/19 0406 08/10/19 1402 08/11/19 0406 08/11/19 1419 08/12/19 0307  WBC 11.5*  --  13.7*  --  10.4  --  10.3  --  8.3  CREATININE 1.01   < > 0.97   < > 0.95 0.96 0.87 0.91 0.79   < > = values in this interval not displayed.    Estimated Creatinine Clearance: 95 mL/min (by C-G formula based on SCr of 0.79 mg/dL).    Not on File  Antimicrobials this admission: Ciprofloxacin 400mg  IV 01/10 >> 01/11 Ceftriaxone 1g IV 01/11 >> 01/12 Unasyn 3g IV 01/13 >> 01/15 Zosyn 3.375mg  IV 01/20 >> 01/21 Vancomycin 1250mg  IV (+loading dose on day 1)  01/20 >>  Ceftriaxone 2g IV 01/21 >>   Dose adjustments this admission: NA  Microbiology results: 01/20 BCx: No growth for 2 days 01/20 BCx: No growth for 2 days 01/20 Respiratory Cx: S. Aureus, susceptibilities to follow; cx reincubated for better growth   01/11 Sputum: Consistent with normal respiratory flora  01/20 MRSA PCR: Negative 01/08 SARS Coronavirus PCR: Negative 01/08 Influenza A/B PCR: Negative   Thank you for allowing pharmacy to be a part of this patient's care.  Oswald Hillock, PharmD, BCPS 08/12/2019 7:23 AM

## 2019-08-12 NOTE — Progress Notes (Signed)
Dr. Lanney Gins ordered that if the sodium level drops below 147 to hold the next dose of the DDAVP.  This order is for nursing for night shift tonight.    Dr. Lanney Gins wants the next provider to be able to see the patient's progress (see this note).  This morning patient was not as responsive as yesterday, then his sodium was 165.  After an IVPB bag of DDAVP his sodium came down.    Patient responded to 2 questions from his son today. Son: is your throat hurting? Patient: No.  Son: Can you breathe okay?  Patient: Yes.  Patient and his son are watching a football game quietly.  Phillis Knack, RN

## 2019-08-12 NOTE — Progress Notes (Signed)
CRITICAL CARE PROGRESS NOTE    Name: Antonio [name] MRN: [id number] DOB: Sep 01, 1945     LOS: 52   SUBJECTIVE FINDINGS & SIGNIFICANT EVENTS   Patient description: Antonio [name] is a 74 yo male with a history of alcohol abuse, tobacco abuse and suspected COPD who presented to the ED on 07/27/2019 as a Antonio [name] after being found down by EMS.  He was admitted to the ICU in acute respiratory distress, intubated and sedated.  On further workup, he was found to have a grade I diastolic dysfunction, moderate aortic stenosis and AKI.     Lines / Drains: Midline single lumen left cephalic PIV right forearm NG/OG tube Urethral catheter Rectal tube ET tube x 9 days  Cultures / Sepsis markers: Respiratory culture 1/11 consistent with normal respiratory flora.  Negative for MRSA, Influenza A/B, Sars-cov-2.  Blood cultures 1/8 showed no growth at 5 days.    Patient remains afebrile at 98.73F.     Protocols / Consultants: CIWA protocol  Tests / Events: 1/9 severe resp failure acute sCHF, intubated 1/8 1/10 SAT/SBT pending, +UTI continue ABX 1/11 patient failed wean due to agitation and increased work of breathing 1/12 patient failed weaning trial 1/13 bradycardia at 48 bpm. No elevation in troponin. Family discussion-patient is DNR  1/14 failed SAT/SBT due to severe delirium and WOB 1/16 Slow to awaken but no asynchrony with sedation vacation 1/17 developed A. fib flutter with controlled ventricular response 1/18 patient remains in anasarca - now on lasix gtt 1/19 MRI brain with no acute CVA but + right temporal encephalomalacia and a meningioma found incidentally 1/20- patient continues to have altered mentation and fever this am. Repeating septic workup.  EEG today. MRI brain yesterday no acute findings. Neuro  consultation 1/21-patient mildly improved in terms of mentation, discussed case with neurology today.  Possible LP 1/22- patient is improving wbc count normalized, mentation improved.  Discussed with Dr Doy Mince. Hypernatremia is refractory to 350W push q4h via OGT, will do one time DDAVP challenge with repeat BMP 1/23-patient has passed weanig trial , mentation improved.  Successfully weaned from MV.  08/12/19 - patient responds well to desmopressin in terms of mentation, when NA incremented up he became confused again, after his DDAVP dose this morning he again became more lucid.    PAST MEDICAL HISTORY   Past Medical History:  Diagnosis Date  . ETOH abuse   . Smoker      SURGICAL HISTORY   History reviewed. No pertinent surgical history.   FAMILY HISTORY   History reviewed. No pertinent family history.   SOCIAL HISTORY   Social History   Tobacco Use  . Smoking status: Current Every Day Smoker    Packs/day: 0.50    Years: 55.00    Pack years: 27.50    Types: Cigarettes  . Smokeless tobacco: Never Used  Substance Use Topics  . Alcohol use: Never  . Drug use: Never     MEDICATIONS   Current Medication:  Current Facility-Administered Medications:  .  0.9 %  sodium chloride infusion, 250 mL, Intravenous, PRN, Flora Lipps, MD, Stopped at 08/09/19 0845 .  acetaminophen (TYLENOL) tablet 650 mg, 650 mg, Oral, Q4H PRN, Flora Lipps, MD, 650 mg at 08/08/19 2044 .  amLODipine (NORVASC) tablet 10 mg, 10 mg, Per Tube, Daily, Awilda Bill, NP, 10 mg at 08/11/19 1005 .  cefTRIAXone (ROCEPHIN) 2 g in sodium chloride 0.9 % 100 mL IVPB, 2 g, Intravenous, Q12H, Ottie Glazier, MD, Last  Rate: 200 mL/hr at 08/12/19 1151, 2 g at 08/12/19 1151 .  chlorhexidine gluconate (MEDLINE KIT) (PERIDEX) 0.12 % solution 15 mL, 15 mL, Mouth Rinse, BID, Kasa, Kurian, MD, 15 mL at 08/11/19 0800 .  Chlorhexidine Gluconate Cloth 2 % PADS 6 each, 6 each, Topical, Daily, Flora Lipps, MD, 6 each  at 08/11/19 2300 .  desmopressin (DDAVP) injection 0.52 mcg, 0.52 mcg, Intravenous, TID, Ottie Glazier, MD, 0.52 mcg at 08/12/19 0946 .  enoxaparin (LOVENOX) injection 40 mg, 40 mg, Subcutaneous, Q24H, Hallaji, Sheema M, RPH, 40 mg at 08/11/19 1001 .  famotidine (PEPCID) tablet 20 mg, 20 mg, Per Tube, BID, Flora Lipps, MD, 20 mg at 08/11/19 1005 .  fluconazole (DIFLUCAN) IVPB 100 mg, 100 mg, Intravenous, Q24H, Chelsa Stout, MD, Last Rate: 50 mL/hr at 08/11/19 1402, 100 mg at 08/11/19 1402 .  folic acid (FOLVITE) tablet 1 mg, 1 mg, Per Tube, Daily, Kasa, Kurian, MD, 1 mg at 08/11/19 1005 .  furosemide (LASIX) injection 20 mg, 20 mg, Intravenous, Daily, Saisha Hogue, MD, 20 mg at 08/12/19 1145 .  hydrALAZINE (APRESOLINE) injection 20 mg, 20 mg, Intravenous, Q4H PRN, Tyler Pita, MD, 20 mg at 08/08/19 0304 .  insulin aspart (novoLOG) injection 0-15 Units, 0-15 Units, Subcutaneous, Q4H, Charlett Nose, RPH, 1 Units at 08/09/19 2337 .  ipratropium-albuterol (DUONEB) 0.5-2.5 (3) MG/3ML nebulizer solution 3 mL, 3 mL, Nebulization, Q6H, Lanney Gins, Kailyn Vanderslice, MD, 3 mL at 08/12/19 0733 .  MEDLINE mouth rinse, 15 mL, Mouth Rinse, 10 times per day, Flora Lipps, MD, 15 mL at 08/11/19 1006 .  metoprolol tartrate (LOPRESSOR) injection 2.5-5 mg, 2.5-5 mg, Intravenous, Q4H PRN, Tyler Pita, MD, 5 mg at 08/08/19 0436 .  multivitamin liquid 15 mL, 15 mL, Oral, Daily, Kasa, Kurian, MD, 15 mL at 08/11/19 1006 .  nystatin (MYCOSTATIN) 100000 UNIT/ML suspension 500,000 Units, 5 mL, Oral, QID, Lanney Gins, Makhi Muzquiz, MD, 500,000 Units at 08/10/19 2109 .  ondansetron (ZOFRAN) injection 4 mg, 4 mg, Intravenous, Q6H PRN, Mortimer Fries, Kurian, MD .  potassium chloride (KLOR-CON) packet 40 mEq, 40 mEq, Oral, Once, Oswald Hillock, RPH .  sodium chloride flush (NS) 0.9 % injection 10-40 mL, 10-40 mL, Intracatheter, PRN, Flora Lipps, MD, 10 mL at 08/03/19 0929 .  [COMPLETED] thiamine '500mg'$  in normal saline (22m) IVPB,  500 mg, Intravenous, Daily, Last Rate: 100 mL/hr at 08/05/19 1033, 500 mg at 08/05/19 1033 **FOLLOWED BY** thiamine tablet 100 mg, 100 mg, Per Tube, Daily, Kasa, Kurian, MD, 100 mg at 08/11/19 1005 .  vancomycin (VANCOREADY) IVPB 1500 mg/300 mL, 1,500 mg, Intravenous, Q24H, POswald Hillock RPH, Last Rate: 150 mL/hr at 08/11/19 1520, 1,500 mg at 08/11/19 1520    ALLERGIES   Patient has no allergy information on record.    REVIEW OF SYSTEMS    Unable to obtain due to severity of patient condition.  PHYSICAL EXAMINATION   Vital Signs: Temp:  [98.6 F (37 C)-99.2 F (37.3 C)] 99.1 F (37.3 C) (01/24 0800) Pulse Rate:  [69-101] 77 (01/24 1100) Resp:  [17-30] 26 (01/24 1100) BP: (114-167)/(32-71) 129/50 (01/24 1100) SpO2:  [94 %-100 %] 98 % (01/24 1100) Weight:  [[144kg] 108 kg (01/24 0500)  GENERAL:Patient is an ill-appearing obese man.  Intubated HEAD: Normocephalic, atraumatic.  EYES: Pupils equal, round, slowly reactive to light.  No scleral icterus.  MOUTH: Moist mucosal membrane. NECK: Supple. No thyromegaly. No nodules. No JVD.  PULMONARY: decreased air entry bilaterally , mild crackles at bases b/l CARDIOVASCULAR: S1 and  S2. Regular rate and rhythm. Distant heart sounds.  Systolic murmur at right upper sternal border consistent with aortic stenosis. GASTROINTESTINAL: Soft, nontender, non-distended. No masses. Positive bowel sounds. No hepatosplenomegaly.  MUSCULOSKELETAL: Edema in bilateral fingers.   NEUROLOGIC: Obtunded.  Pupils slowly reactive. Corneal reflex sluggish.  Cough reflex intact.  Patient does not follow commands.  Makes non-purposeful movements of extremities.   SKIN:intact,warm,dry   PERTINENT DATA     Infusions: . sodium chloride Stopped (08/09/19 0845)  . cefTRIAXone (ROCEPHIN)  IV 2 g (08/12/19 1151)  . fluconazole (DIFLUCAN) IV 100 mg (08/11/19 1402)  . vancomycin 1,500 mg (08/11/19 1520)   Scheduled Medications: . amLODipine  10 mg Per Tube  Daily  . chlorhexidine gluconate (MEDLINE KIT)  15 mL Mouth Rinse BID  . Chlorhexidine Gluconate Cloth  6 each Topical Daily  . desmopressin  0.52 mcg Intravenous TID  . enoxaparin (LOVENOX) injection  40 mg Subcutaneous Q24H  . famotidine  20 mg Per Tube BID  . folic acid  1 mg Per Tube Daily  . furosemide  20 mg Intravenous Daily  . insulin aspart  0-15 Units Subcutaneous Q4H  . ipratropium-albuterol  3 mL Nebulization Q6H  . mouth rinse  15 mL Mouth Rinse 10 times per day  . multivitamin  15 mL Oral Daily  . nystatin  5 mL Oral QID  . potassium chloride  40 mEq Oral Once  . thiamine  100 mg Per Tube Daily   PRN Medications: sodium chloride, acetaminophen, hydrALAZINE, metoprolol tartrate, ondansetron (ZOFRAN) IV, sodium chloride flush Hemodynamic parameters:   Intake/Output: 01/23 0701 - 01/24 0700 In: 300 [IV [id number]:573] Out: 2202 [Urine:2250; Stool:200]  Ventilator  Settings:     LAB RESULTS:  Basic Metabolic Panel: Recent Labs  Lab 08/06/19 0732 08/06/19 0732 08/07/19 0359 08/07/19 0359 08/08/19 5427 08/09/19 0438 08/10/19 0406 08/10/19 0406 08/10/19 1402 08/10/19 1402 08/11/19 0406 08/11/19 0406 08/11/19 1419 08/11/19 1859 08/11/19 2320 08/12/19 0307 08/12/19 0703  NA 149*   < > 152*   < > 153*   < > 154*   < > 150*   < > 155*   < > 152* 152* 152* 153* 155*  K 4.4   < > 3.6   < > 3.6   < > 3.0*   < > 3.6   < > 3.4*   < > 4.7  --   --  3.8  --   CL 115*   < > 114*   < > 115*   < > 115*  --  113*  --  118*  --  115*  --   --  116*  --   CO2 23   < > 29   < > 27   < > 31  --  29  --  30  --  26  --   --  31  --   GLUCOSE 147*   < > 111*   < > 140*   < > 120*  --  137*  --  125*  --  106*  --   --  92  --   BUN 58*   < > 54*   < > 57*   < > 51*  --  51*  --  46*  --  40*  --   --  37*  --   CREATININE 1.10   < > 1.01   < > 1.16   < > 0.95  --  0.96  --  0.87  --  0.91  --   --  0.79  --   CALCIUM 9.0   < > 8.8*   < > 8.6*   < > 8.1*  --  8.2*  --  8.5*   --  8.6*  --   --  8.4*  --   MG 2.6*  --  2.1  --  2.6*  --   --   --   --   --  2.6*  --   --   --   --   --   --   PHOS 3.3  --   --   --   --   --   --   --   --   --  2.9  --   --   --   --   --   --    < > = values in this interval not displayed.   Liver Function Tests: Recent Labs  Lab 08/08/19 0927  AST 87*  ALT 92*  ALKPHOS 55  BILITOT 0.8  PROT 6.6  ALBUMIN 2.9*   No results for input(s): LIPASE, AMYLASE in the last 168 hours. Recent Labs  Lab 08/08/19 1234  AMMONIA 64*   CBC: Recent Labs  Lab 08/06/19 0732 08/06/19 0732 08/07/19 0359 08/09/19 0438 08/10/19 0406 08/11/19 0406 08/12/19 0307  WBC 12.3*   < > 11.5* 13.7* 10.4 10.3 8.3  NEUTROABS 9.8*  --  8.6*  --   --  7.6 6.0  HGB 14.0   < > 14.1 12.5* 11.7* 11.7* 11.8*  HCT 43.9   < > 43.0 40.7 38.4* 37.9* 39.2  MCV 98.9   < > 97.7 102.3* 102.9* 103.0* 104.0*  PLT 257   < > 282 242 254 249 262   < > = values in this interval not displayed.   Cardiac Enzymes: No results for input(s): CKTOTAL, CKMB, CKMBINDEX, TROPONINI in the last 168 hours. BNP: Invalid input(s): POCBNP CBG: Recent Labs  Lab 08/11/19 1933 08/11/19 2338 08/12/19 0346 08/12/19 0734 08/12/19 1157  GLUCAP 95 86 88 93 88     IMAGING RESULTS:  Imaging: DG Chest Port 1 View  Result Date: 08/11/2019 CLINICAL DATA:  Respiratory distress. The patient was found down 07/28/2019. EXAM: PORTABLE CHEST 1 VIEW COMPARISON:  Single-view of the chest 08/10/2019 and 08/08/2019. FINDINGS: Endotracheal tube and NG tube have been removed. Right PICC is unchanged. The right lung is clear. There may be a small left pleural effusion and basilar atelectasis. No pneumothorax. Heart size is upper normal. Atherosclerosis noted. IMPRESSION: Status post extubation and NG tube removal. Possible small left pleural effusion. No change in left basilar atelectasis. Electronically Signed   By: Inge Rise M.D.   On: 08/11/2019 14:09        ASSESSMENT AND  PLAN    -Multidisciplinary rounds held today  Acute Hypoxic Respiratory Failure  -Due to acute decompensated systolic CHF as well as possible pneumonia since patient has been febrile with leukocytosis. 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 - change desmopressin to 0.5 mcg TID     - q4h Na monitoring -Discussed case with neurology-Dr Zeylikman-appreciate input -Status post EEG-no specific findings -Status post MRI brain no acute findings -s/p ACTH stim test-  normal steroid response -TSH with subclinical hyperthyroidism due to acutely ill state  Oral thrush   - refusing nystatin swish and swallow   - IV diflucan x 5d  CARDIAC FAILURE with grade I diastolic dysfunction -oxygen as needed -Lasix as tolerated -follow up cardiac enzymes as indicated - Continue topical nitroglycerin ICU monitoring  Hypertension - BP continues to stay in the 175/37 mmHg range. - amlodipine 10 mg daily - Metoprolol  Q4H prn for hypertension - Clonidine 0.1 mg BID   Alcoholism Monitoring for signs of withdrawal - History of alcohol abuse - Continue thiamine 388 mg daily, folic acid 1 mg daily - Modified CIWA protocol with hydralazine    Hypernatremia  -refractory to 7 days of free water flushes even when increased to 350 every 4 -due to central DI -Switch Zosyn due to high sodium load to Rocephin 2g for BBB penetration empirically    GI/Nutrition GI PROPHYLAXIS as indicated DIET-->Continue TF's as tolerated Patient continues to have loose bowel movements- rectal tube in place.  Hold stool softeners.     ENDO - ICU hypoglycemic\Hyperglycemia protocol -check FSBS per protocol - Continue insulin sliding  scale  Intertrigo in groin - hydrocerin cream BID. - Will continue nursing wound care and bedding changes to reduce the risk of further breakdown.   Goals of care - Patient DNR status  - Patient has been intubated for 9 days.  Plan family meeting today to discuss any further intubation in future   ELECTROLYTES -follow labs as needed -replace as needed  -pharmacy consultation   DVT/GI PRX ordered - Lovenox 40 mg QD for DVT prophylaxis TRANSFUSIONS AS NEEDED MONITOR FSBS ASSESS the need for LABS as needed   Critical care provider statement:    Critical care time (minutes):  34   Critical care time was exclusive of:  Separately billable procedures and treating other patients   Critical care was necessary to treat or prevent imminent or life-threatening deterioration of the following conditions:  Acute respiratory failure, Delerium tremens   Critical care was time spent personally by me on the following activities:  Development of treatment plan with patient or surrogate, discussions with consultants, evaluation of patient's response to treatment, examination of patient, obtaining history from patient or surrogate, ordering and performing treatments and interventions, ordering and review of laboratory studies and re-evaluation of patient's condition.  I assumed direction of critical care for this patient from another provider in my specialty: no    Patient remains critically ill with multi-organ failure.  At risk of developing cardiac arrest and death. Patient currently DNR status.    Ottie Glazier, M.D.  [facility]

## 2019-08-12 NOTE — Consult Note (Signed)
PHARMACY CONSULT NOTE - FOLLOW UP  Pharmacy Consult for Electrolyte Monitoring and Replacement   Antonio [name] is a morbidly obese 74 y.o. male admitted on 07/27/2019. EMS stated that he collapsed on the stretcher and became unresponsive. Pt was apneic, but still had a pulse. Pt has AKI, severe respiratory distress, and acute respiratory failure. Pt is intubated on mechanical ventilation. Per am rounds, desmopressin will be initiated to reduce sodium levels, along with free water flushes. Pharmacy consulted to assist with replacing and monitoring electrolytes.   Recent Labs: Potassium (mmol/L)  Date Value  08/12/2019 3.8   Magnesium (mg/dL)  Date Value  08/11/2019 2.6 (H)   Calcium (mg/dL)  Date Value  08/12/2019 8.4 (L)   Albumin (g/dL)  Date Value  08/08/2019 2.9 (L)   Phosphorus (mg/dL)  Date Value  08/11/2019 2.9   Sodium (mmol/L)  Date Value  08/12/2019 153 (H)    Assessment: 1. Electrolytes: No changes in sodium levels since yesterday, despite the free water flushes. MD recommended desmopressin x 1 on 1/22 to help reduce sodium levels along with free water flushes. Starting DDAVP BID per Dr. Loni Muse. ADH lab ordered.  Potassium levels still low and will provider to replace with 44mEq x 2 doses. Continue monitoring electrolytes in am labs. Will replace to achieve goal potassium ~4, sodium ~140, and magnesium ~2.    2. Glucose: Today's range: 104-125. Glucose levels are lower than yesterday. Glucose-capillary monitored Q4H. Continue insulin aspart 0-15 [place] Q4H and monitor glucose daily.   3. Constipation: Last BM: 01/22. Continue monitoring BM and add constipation-relief meds if constipation recurs.   1/23 @ 2320, Na = 152 (no change from previous lab) 1/24 @ 0307, Na = 153, recheck in 4 hours per current lab orders  Hart Robinsons A PharmD Clinical Pharmacist 08/12/2019 5:06 AM

## 2019-08-12 NOTE — Consult Note (Addendum)
PHARMACY CONSULT NOTE - FOLLOW UP  Pharmacy Consult for Electrolyte Monitoring and Replacement   Antonio [name] is a morbidly obese 74 y.o. male admitted on 07/27/2019. EMS stated that he collapsed on the stretcher and became unresponsive. Pt was apneic, but still had a pulse. Pt has AKI, severe respiratory distress, and acute respiratory failure. Pt is extubated and improving. Desmopressin will be initiated to reduce sodium levels, along with free water flushes. Pharmacy consulted to assist with replacing and monitoring electrolytes.   Recent Labs: Potassium (mmol/L)  Date Value  08/12/2019 3.8   Magnesium (mg/dL)  Date Value  08/11/2019 2.6 (H)   Calcium (mg/dL)  Date Value  08/12/2019 8.4 (L)   Albumin (g/dL)  Date Value  08/08/2019 2.9 (L)   Phosphorus (mg/dL)  Date Value  08/11/2019 2.9   Sodium (mmol/L)  Date Value  08/12/2019 153 (H)    Assessment: 1. Electrolytes: No changes in sodium levels since yesterday, despite the free water flushes. MD recommended desmopressin x 1 on 1/22 to help reduce sodium levels along with free water flushes. Starting DDAVP BID per Dr. Loni [name] on 1/23. ADH lab ordered - in process. Team plans to give pt DDAVP 20 mg x 1 on 1/24 and increased DDAVP to 0.52 mg TID.  Continue monitoring electrolytes in am labs. Will give Kcl 40 mEq x 1 PO. Will replace to achieve goal potassium ~4, sodium ~140, and magnesium ~2.    2. Glucose: Today's range: 88-92. Glucose levels are lower than yesterday. Glucose-capillary monitored Q4H. Continue insulin aspart 0-15 [place] Q4H and monitor glucose daily.   3. Constipation: Last BM: 01/23. Continue monitoring BM and add constipation-relief meds if constipation recurs.    Antonio [name] PharmD, BCPS Clinical Pharmacist 08/12/2019 7:14 AM

## 2019-08-12 NOTE — Progress Notes (Signed)
Compatible with 5 % Dextrose Diflucan / fluconazole Magnesium sulfate vancomycin Rocephin / ceftriaxone  Patient has high sodium.  MD said it is better to use 5% Dextrose as his carrier fluid for IVs.  Micromedex was consulted and the majority of the Iv meds are compatible.  DDAVP needs normal saline.   Phillis Knack, RN

## 2019-08-12 NOTE — Progress Notes (Signed)
Pharmacy Antibiotic Note  Antonio [name] is a morbidly obese 74 y.o. male admitted on 07/27/2019. EMS stated that he collapsed on the stretcher and became unresponsive. Pt was apneic, but still had a pulse. Pt has AKI, severe respiratory distress, and acute respiratory failure. Pt is intubated on mechanical ventilation. Pt has afebrile and WBCs are WNL. Ceftriaxone was started yesterday for possible pneumonia, but now need to increase the dose for CNS dosing for possible meningitis stated by neurologist. No plan for LP at this time per neuro. Pharmacy has been consulted for ceftriaxone and vancomyin dosing.  Plan: Day 5 of abx. Change ceftriaxone dosing to 2g IV Q12H (for CNS coverage)  Increase vancomycin 1500mg  IV Q24H. Monitor renal function with am labs and adjust dose accordingly. Will plan for levels on Monday if vanc to continue. Will de-escalate per respiratory culture. Will dose vanc based on treatment for pnuemonia. Predicted AUC 442. With a goal AUC of 400-550. Used Vd coefficient of 0.5 due to BMI. Scr used 0.87. Tracheal aspirate growing rare staphylococcus aureus. Peak and trough levels ordered.   1/24:  Vanc peak @ 1700 = 45.   Vanc was hung @ 1400 so this value appears to be valid.  Height: 5' 7.01" (170.2 cm) Weight: 238 lb 1.6 oz (108 kg) IBW/kg (Calculated) : 66.12  Temp (24hrs), Avg:98.9 F (37.2 C), Min:98.4 F (36.9 C), Max:99.8 F (37.7 C)  Recent Labs  Lab 08/07/19 0359 08/08/19 0927 08/09/19 0438 08/09/19 0438 08/10/19 0406 08/10/19 1402 08/11/19 0406 08/11/19 1419 08/12/19 0307 08/12/19 1701  WBC 11.5*  --  13.7*  --  10.4  --  10.3  --  8.3  --   CREATININE 1.01   < > 0.97   < > 0.95 0.96 0.87 0.91 0.79  --   VANCOPEAK  --   --   --   --   --   --   --   --   --  45*   < > = values in this interval not displayed.    Estimated Creatinine Clearance: 95 mL/min (by C-G formula based on SCr of 0.79 mg/dL).    Not on File  Antimicrobials this  admission: Ciprofloxacin 400mg  IV 01/10 >> 01/11 Ceftriaxone 1g IV 01/11 >> 01/12 Unasyn 3g IV 01/13 >> 01/15 Zosyn 3.375mg  IV 01/20 >> 01/21 Vancomycin 1250mg  IV (+loading dose on day 1) 01/20 >>  Ceftriaxone 2g IV 01/21 >>   Dose adjustments this admission: NA  Microbiology results: 01/20 BCx: No growth for 2 days 01/20 BCx: No growth for 2 days 01/20 Respiratory Cx: S. Aureus, susceptibilities to follow; cx reincubated for better growth   01/11 Sputum: Consistent with normal respiratory flora  01/20 MRSA PCR: Negative 01/08 SARS Coronavirus PCR: Negative 01/08 Influenza A/B PCR: Negative   Thank you for allowing pharmacy to be a part of this patient's care.  Sayvion Vigen D, PharmD, BCPS 08/12/2019 8:41 PM

## 2019-08-12 NOTE — Consult Note (Signed)
PHARMACY CONSULT NOTE - FOLLOW UP  Pharmacy Consult for Electrolyte Monitoring and Replacement   Antonio [name] is a morbidly obese 74 y.o. male admitted on 07/27/2019. EMS stated that he collapsed on the stretcher and became unresponsive. Pt was apneic, but still had a pulse. Pt has AKI, severe respiratory distress, and acute respiratory failure. Pt is extubated and improving. Desmopressin will be initiated to reduce sodium levels, along with free water flushes. Pharmacy consulted to assist with replacing and monitoring electrolytes.   Recent Labs: Potassium (mmol/L)  Date Value  08/12/2019 3.8   Magnesium (mg/dL)  Date Value  08/11/2019 2.6 (H)   Calcium (mg/dL)  Date Value  08/12/2019 8.4 (L)   Albumin (g/dL)  Date Value  08/08/2019 2.9 (L)   Phosphorus (mg/dL)  Date Value  08/11/2019 2.9   Sodium (mmol/L)  Date Value  08/12/2019 147 (H)    Assessment: 1. Electrolytes: No changes in sodium levels since yesterday, despite the free water flushes. MD recommended desmopressin x 1 on 1/22 to help reduce sodium levels along with free water flushes. Starting DDAVP BID per Dr. Loni Muse on 1/23. ADH lab ordered - in process. Team plans to give pt DDAVP 20 mg x 1 on 1/24 and increased DDAVP to 0.52 mg TID.  Continue monitoring electrolytes in am labs. Will give Kcl 40 mEq x 1 PO. Will replace to achieve goal potassium ~4, sodium ~140, and magnesium ~2.   1/24:  Na @ 1200 = 165           Na @ 1600 = 147  - notified Dr Lanney Gins of rapid drop in Na, he feels this reflects DDAVP 20 mcg given @ 1336.  Will recheck Na @ 2000.  If Na continues to drop, will hold DDAVP.    2. Glucose: Today's range: 88-92. Glucose levels are lower than yesterday. Glucose-capillary monitored Q4H. Continue insulin aspart 0-15 [place] Q4H and monitor glucose daily.   3. Constipation: Last BM: 01/23. Continue monitoring BM and add constipation-relief meds if constipation recurs.    Violeta Gelinas D PharmD, BCPS Clinical  Pharmacist 08/12/2019 6:35 PM

## 2019-08-13 DIAGNOSIS — E870 Hyperosmolality and hypernatremia: Secondary | ICD-10-CM

## 2019-08-13 LAB — BASIC METABOLIC PANEL
Anion gap: 9 (ref 5–15)
BUN: 41 mg/dL — ABNORMAL HIGH (ref 8–23)
CO2: 28 mmol/L (ref 22–32)
Calcium: 8.5 mg/dL — ABNORMAL LOW (ref 8.9–10.3)
Chloride: 119 mmol/L — ABNORMAL HIGH (ref 98–111)
Creatinine, Ser: 1.02 mg/dL (ref 0.61–1.24)
GFR calc Af Amer: >60 mL/min (ref >60)
GFR calc non Af Amer: >60 mL/min (ref >60)
Glucose, Bld: 110 mg/dL — ABNORMAL HIGH (ref 70–99)
Potassium: 3.8 mmol/L (ref 3.5–5.1)
Sodium: 156 mmol/L — ABNORMAL HIGH (ref 135–145)

## 2019-08-13 LAB — SODIUM
Sodium: 155 mmol/L — ABNORMAL HIGH (ref 135–145)
Sodium: 155 mmol/L — ABNORMAL HIGH (ref 135–145)
Sodium: 155 mmol/L — ABNORMAL HIGH (ref 135–145)
Sodium: 158 mmol/L — ABNORMAL HIGH (ref 135–145)

## 2019-08-13 LAB — GLUCOSE, CAPILLARY
Glucose-Capillary: 100 mg/dL — ABNORMAL HIGH (ref 70–99)
Glucose-Capillary: 109 mg/dL — ABNORMAL HIGH (ref 70–99)
Glucose-Capillary: 113 mg/dL — ABNORMAL HIGH (ref 70–99)
Glucose-Capillary: 118 mg/dL — ABNORMAL HIGH (ref 70–99)
Glucose-Capillary: 139 mg/dL — ABNORMAL HIGH (ref 70–99)
Glucose-Capillary: 146 mg/dL — ABNORMAL HIGH (ref 70–99)
Glucose-Capillary: 90 mg/dL (ref 70–99)

## 2019-08-13 LAB — CULTURE, BLOOD (ROUTINE X 2)
Culture: NO GROWTH
Culture: NO GROWTH
Special Requests: ADEQUATE

## 2019-08-13 LAB — CBC
HCT: 39.5 % (ref 39.0–52.0)
Hemoglobin: 12 g/dL — ABNORMAL LOW (ref 13.0–17.0)
MCH: 31.5 pg (ref 26.0–34.0)
MCHC: 30.4 g/dL (ref 30.0–36.0)
MCV: 103.7 fL — ABNORMAL HIGH (ref 80.0–100.0)
Platelets: 283 10*3/uL (ref 150–400)
RBC: 3.81 MIL/uL — ABNORMAL LOW (ref 4.22–5.81)
RDW: 13.7 % (ref 11.5–15.5)
WBC: 9.3 10*3/uL (ref 4.0–10.5)
nRBC: 0 % (ref 0.0–0.2)

## 2019-08-13 LAB — SODIUM, URINE, RANDOM: Sodium, Ur: 162 mmol/L

## 2019-08-13 LAB — PHOSPHORUS: Phosphorus: 3.5 mg/dL (ref 2.5–4.6)

## 2019-08-13 LAB — MAGNESIUM: Magnesium: 3 mg/dL — ABNORMAL HIGH (ref 1.7–2.4)

## 2019-08-13 LAB — VANCOMYCIN, TROUGH: Vancomycin Tr: 10 ug/mL — ABNORMAL LOW (ref 15–20)

## 2019-08-13 MED ORDER — FAMOTIDINE IN NACL 20-0.9 MG/50ML-% IV SOLN
20.0000 mg | Freq: Two times a day (BID) | INTRAVENOUS | Status: DC
  Administered 2019-08-13 – 2019-08-14 (×3): 20 mg via INTRAVENOUS
  Filled 2019-08-13 (×3): qty 50

## 2019-08-13 MED ORDER — INSULIN ASPART 100 UNIT/ML ~~LOC~~ SOLN
0.0000 [IU] | Freq: Four times a day (QID) | SUBCUTANEOUS | Status: DC
  Administered 2019-08-13 – 2019-08-14 (×2): 2 [IU] via SUBCUTANEOUS
  Filled 2019-08-13 (×2): qty 1

## 2019-08-13 MED ORDER — DEXTROSE 5 % IV SOLN: INTRAVENOUS | Status: DC

## 2019-08-13 MED ORDER — AMLODIPINE BESYLATE 10 MG PO TABS
10.0000 mg | ORAL_TABLET | Freq: Every day | ORAL | Status: DC
  Administered 2019-08-14 – 2019-08-20 (×6): 10 mg via ORAL
  Filled 2019-08-13 (×6): qty 1

## 2019-08-13 MED ORDER — THIAMINE HCL 100 MG/ML IJ SOLN
100.0000 mg | Freq: Every day | INTRAMUSCULAR | Status: DC
  Administered 2019-08-13 – 2019-08-14 (×2): 100 mg via INTRAVENOUS
  Filled 2019-08-13 (×2): qty 2

## 2019-08-13 MED ORDER — FOLIC ACID 5 MG/ML IJ SOLN
1.0000 mg | Freq: Every day | INTRAMUSCULAR | Status: DC
  Administered 2019-08-13 – 2019-08-14 (×2): 1 mg via INTRAVENOUS
  Filled 2019-08-13 (×2): qty 0.2

## 2019-08-13 NOTE — Progress Notes (Addendum)
PROGRESS NOTE    Antonio [name]  BJY:[id number] DOB: 02-11-1946 DOA: 07/27/2019 PCP: Patient, No Pcp Per   Brief Narrative:  Antonio [name] is a 74 yo male with a history of alcohol abuse, tobacco abuse and suspected COPD who presented to the ED on 07/27/2019 after being found down by EMS. He was admitted to the ICU in acute respiratory distress, intubated and sedated. On further workup, he was found to have a grade I diastolic dysfunction, moderate aortic stenosis and AKI.  Extubated on 08/11/2019.  Did develop resistant hypernatremia which did not respond to free water initially, started on desmopressin.  Subjective: Patient was awake but quite encephalopathic, not following any command and remain nonverbal for me.  Assessment & Plan:   Active Problems:   Respiratory failure (HCC)  Acute hypoxic and hypercapnic respiratory failure.  Initially treated for COPD exacerbation.  He was initially treated with multiple antibiotics in ICU which include Zosyn, vancomycin, ceftriaxone and Unasyn.  Now off antibiotics.  No UDS in the system.  Saturating well on 2 L.  No wheezing. -Continue DuoNeb treatment.  Hypernatremia.  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 -Continue desmopressin. -Continue to monitor.  History of alcohol abuse.  Out of withdrawal now. -We will need counseling against alcohol once mental status improves.  Encephalopathy.  Most likely secondary to serious illness and hyponatremia. Imaging and EEG was negative for any acute changes. -Continue to monitor. -Supportive care. -Did swallow evaluation-was placed on dysphagia diet.  Acute diastolic heart failure.  Echo with grade 1 diastolic heart failure. -Continue Lasix 40 mg daily. -Monitor BMP. - intake and output. -Daily weight  Hypertension.  Pressure mildly elevated. -Continue amlodipine and metoprolol.  Objective: Vitals:   08/13/19 0800 08/13/19 0900 08/13/19 1000 08/13/19 1100  BP:  (!) 155/45 (!) 150/52 (!) 158/48 (!) 133/49  Pulse: 85 88 86 90  Resp: (!) 29 (!) 28 (!) 28 (!) 26  Temp: 98.1 F (36.7 C)     TempSrc:      SpO2: 100% 99% 99% 93%  Weight:      Height:        Intake/Output Summary (Last 24 hours) at 08/13/2019 1526 Last data filed at 08/13/2019 0865 Gross per 24 hour  Intake 794.8 ml  Output 1125 ml  Net -330.2 ml   Filed Weights   08/11/19 0500 08/12/19 0500 08/13/19 0425  Weight: 108.9 kg 108 kg 104 kg    Examination:  General exam: Chronically ill-appearing gentleman, appears older than the stated age.  In no acute distress. Respiratory system: Clear to auscultation. Respiratory effort normal. Cardiovascular system: S1 & S2 heard, RRR. No JVD, murmurs, rubs, gallops or clicks. Gastrointestinal system: Soft, nontender, nondistended, bowel sounds positive. Central nervous system: Alert but nonverbal and not following any command, unable to assess neurologic function.  Moving all extremities. Extremities: Bilateral upper extremity edema, no lower extremity edema, no cyanosis, pulses intact and symmetrical. Psychiatry: Judgement and insight appear impaired.  DVT prophylaxis: Lovenox Code Status: DNR Family Communication: No family at bedside. Disposition Plan: Pending improvement and PT evaluation for placement. Patient is critically ill with bad prognosis at this time.  High risk for deterioration.  Consultants:   PCCM  Cardiology  Neurology  Procedures:  Antimicrobials:   Data Reviewed: I have personally reviewed following labs and imaging studies  CBC: Recent Labs  Lab 08/07/19 0359 08/07/19 0359 08/09/19 0438 08/10/19 0406 08/11/19 0406 08/12/19 0307 08/13/19 0415  WBC 11.5*   < > 13.7* 10.4  10.3 8.3 9.3  NEUTROABS 8.6*  --   --   --  7.6 6.0  --   HGB 14.1   < > 12.5* 11.7* 11.7* 11.8* 12.0*  HCT 43.0   < > 40.7 38.4* 37.9* 39.2 39.5  MCV 97.7   < > 102.3* 102.9* 103.0* 104.0* 103.7*  PLT 282   < > 242 254 249 262  283   < > = values in this interval not displayed.   Basic Metabolic Panel: Recent Labs  Lab 08/07/19 0359 08/07/19 0359 08/08/19 0927 08/09/19 0438 08/10/19 1402 08/10/19 1402 08/11/19 0406 08/11/19 0406 08/11/19 1419 08/11/19 1859 08/12/19 0307 08/12/19 0703 08/12/19 2010 08/12/19 2347 08/13/19 0415 08/13/19 0732 08/13/19 1117  NA 152*   < > 153*   < > 150*   < > 155*   < > 152*   < > 153*   < > 155* 155* 156* 158* 155*  K 3.6   < > 3.6   < > 3.6  --  3.4*  --  4.7  --  3.8  --   --   --  3.8  --   --   CL 114*   < > 115*   < > 113*  --  118*  --  115*  --  116*  --   --   --  119*  --   --   CO2 29   < > 27   < > 29  --  30  --  26  --  31  --   --   --  28  --   --   GLUCOSE 111*   < > 140*   < > 137*  --  125*  --  106*  --  92  --   --   --  110*  --   --   BUN 54*   < > 57*   < > 51*  --  46*  --  40*  --  37*  --   --   --  41*  --   --   CREATININE 1.01   < > 1.16   < > 0.96  --  0.87  --  0.91  --  0.79  --   --   --  1.02  --   --   CALCIUM 8.8*   < > 8.6*   < > 8.2*  --  8.5*  --  8.6*  --  8.4*  --   --   --  8.5*  --   --   MG 2.1  --  2.6*  --   --   --  2.6*  --   --   --   --   --   --   --  3.0*  --   --   PHOS  --   --   --   --   --   --  2.9  --   --   --   --   --   --   --   --   --  3.5   < > = values in this interval not displayed.   GFR: Estimated Creatinine Clearance: 73.1 mL/min (by C-G formula based on SCr of 1.02 mg/dL). Liver Function Tests: Recent Labs  Lab 08/08/19 0927  AST 87*  ALT 92*  ALKPHOS 55  BILITOT 0.8  PROT 6.6  ALBUMIN 2.9*   No results  for input(s): LIPASE, AMYLASE in the last 168 hours. Recent Labs  Lab 08/08/19 1234  AMMONIA 64*   Coagulation Profile: Recent Labs  Lab 08/08/19 1234  INR 1.4*   Cardiac Enzymes: No results for input(s): CKTOTAL, CKMB, CKMBINDEX, TROPONINI in the last 168 hours. BNP (last 3 results) No results for input(s): PROBNP in the last 8760 hours. HbA1C: No results for input(s):  HGBA1C in the last 72 hours. CBG: Recent Labs  Lab 08/12/19 2337 08/13/19 0345 08/13/19 0715 08/13/19 1139 08/13/19 1431  GLUCAP 91 90 100* 109* 146*   Lipid Profile: No results for input(s): CHOL, HDL, LDLCALC, TRIG, CHOLHDL, LDLDIRECT in the last 72 hours. Thyroid Function Tests: No results for input(s): TSH, T4TOTAL, FREET4, T3FREE, THYROIDAB in the last 72 hours. Anemia Panel: No results for input(s): VITAMINB12, FOLATE, FERRITIN, TIBC, IRON, RETICCTPCT in the last 72 hours. Sepsis Labs: Recent Labs  Lab 08/08/19 0927 08/09/19 0438 08/10/19 0406  PROCALCITON 0.29 0.25 0.25    Recent Results (from the past 240 hour(s))  CULTURE, BLOOD (ROUTINE X 2) w Reflex to ID Panel     Status: None   Collection Time: 08/08/19  9:27 AM   Specimen: BLOOD  Result Value Ref Range Status   Specimen Description BLOOD RIGHT FOOT  Final   Special Requests   Final    BOTTLES DRAWN AEROBIC AND ANAEROBIC Blood Culture adequate volume   Culture   Final    NO GROWTH 5 DAYS Performed at [facility], [address] [place], [place] [address]    Report Status 08/13/2019 FINAL  Final  CULTURE, BLOOD (ROUTINE X 2) w Reflex to ID Panel     Status: None   Collection Time: 08/08/19  9:27 AM   Specimen: BLOOD  Result Value Ref Range Status   Specimen Description BLOOD LEFT FOOT  Final   Special Requests   Final    BOTTLES DRAWN AEROBIC AND ANAEROBIC Blood Culture results may not be optimal due to an excessive volume of blood received in culture bottles   Culture   Final    NO GROWTH 5 DAYS Performed at [facility], [address] [place], [place] [address]    Report Status 08/13/2019 FINAL  Final  MRSA PCR Screening     Status: None   Collection Time: 08/08/19  9:43 AM   Specimen: Nasopharyngeal  Result Value Ref Range Status   MRSA by PCR NEGATIVE NEGATIVE Final    Comment:        The GeneXpert MRSA Assay (FDA approved for NASAL specimens only), is one component of  a comprehensive MRSA colonization surveillance program. It is not intended to diagnose MRSA infection nor to guide or monitor treatment for MRSA infections. Performed at [facility], [address] [place], [place] [address]   Culture, respiratory (non-expectorated)     Status: None   Collection Time: 08/08/19  5:01 PM   Specimen: Tracheal Aspirate; Respiratory  Result Value Ref Range Status   Specimen Description   Final    TRACHEAL ASPIRATE Performed at [facility], [address] [place], [place] [address]    Special Requests   Final    NONE Performed at [facility], [address] [place], [place] [address]    Gram Stain   Final    RARE WBC PRESENT, PREDOMINANTLY PMN RARE GRAM POSITIVE COCCI IN PAIRS Performed at [facility], [place] [address] [place], [place] [address]    Culture RARE  STAPHYLOCOCCUS AUREUS  Final   Report Status 08/12/2019 FINAL  Final   Organism ID, Bacteria STAPHYLOCOCCUS AUREUS  Final      Susceptibility   Staphylococcus aureus - MIC*    CIPROFLOXACIN <=0.5 SENSITIVE Sensitive     ERYTHROMYCIN <=0.25 SENSITIVE Sensitive     GENTAMICIN <=0.5 SENSITIVE Sensitive     OXACILLIN 0.5 SENSITIVE Sensitive     TETRACYCLINE <=1 SENSITIVE Sensitive     VANCOMYCIN 1 SENSITIVE Sensitive     TRIMETH/SULFA <=10 SENSITIVE Sensitive     CLINDAMYCIN <=0.25 SENSITIVE Sensitive     RIFAMPIN <=0.5 SENSITIVE Sensitive     Inducible Clindamycin NEGATIVE Sensitive     * RARE STAPHYLOCOCCUS AUREUS  Culture, respiratory (non-expectorated)     Status: None   Collection Time: 08/10/19 10:04 AM   Specimen: Bronchoalveolar Lavage; Respiratory  Result Value Ref Range Status   Specimen Description   Final    BRONCHIAL ALVEOLAR LAVAGE Performed at [facility], [address], [place], [place] [address]    Special Requests   Final    NONE Performed at [facility], [address] [place], [place] [address]     Gram Stain NO WBC SEEN NO ORGANISMS SEEN   Final   Culture   Final    RARE Consistent with normal respiratory flora. Performed at [facility], [place] [address], [place], [place] [address]    Report Status 08/12/2019 FINAL  Final     Radiology Studies: No results found.  Scheduled Meds: . amLODipine  10 mg Per Tube Daily  . chlorhexidine gluconate (MEDLINE KIT)  15 mL Mouth Rinse BID  . Chlorhexidine Gluconate Cloth  6 each Topical Daily  . enoxaparin (LOVENOX) injection  40 mg Subcutaneous Q24H  . folic acid  1 mg Intravenous Daily  . furosemide  40 mg Intravenous Daily  . insulin aspart  0-15 Units Subcutaneous Q4H  . ipratropium-albuterol  3 mL Nebulization Q6H  . mouth rinse  15 mL Mouth Rinse 10 times per day  . nystatin  5 mL Oral QID  . thiamine injection  100 mg Intravenous Daily   Continuous Infusions: . sodium chloride Stopped (08/09/19 0845)  . dextrose 50 mL/hr at 08/13/19 1108  . famotidine (PEPCID) IV 20 mg (08/13/19 1109)     LOS: 16 days   Time spent: 50 minutes.  Lorella Nimrod, MD [facility] Pager [phone]  If 7PM-7AM, please contact night-coverage [email or web address] Password [facility] 08/13/2019, 3:26 PM   This record has been created using Dragon voice recognition software. Errors have been sought and corrected,but may not always be located. Such creation errors do not reflect on the standard of care.

## 2019-08-13 NOTE — Evaluation (Signed)
Clinical/Bedside Swallow Evaluation Patient Details  Name: Antonio [name] MRN: [id number] Date of Birth: 1945/08/05  Today's Date: 08/13/2019 Time: SLP Start Time (ACUTE ONLY): 84 SLP Stop Time (ACUTE ONLY): 1240 SLP Time Calculation (min) (ACUTE ONLY): 70 min  Past Medical History:  Past Medical History:  Diagnosis Date  . ETOH abuse   . Smoker    Past Surgical History: History reviewed. No pertinent surgical history.   HPI: Per admitting history and Physical 07/28/19....Marland Kitchen"Unknown Morbidly obese WM Per ems pt walked out to truck, collapsed on stretcher and became unresponsive. Pt never lost pulses but became apneic. Pt being bagged on ems arrival, sats 98%. HR 118 ST on monitor. Pt has unknown history".   Patient with severe resp distress Emergently intubated and sedated in ER     Assessment / Plan / Recommendation Clinical Impression  Pt is 74 YO male admitted with Severe ACUTE Hypoxic and Hypercapnic Respiratory Failure from acute pulmonary edema. Currently with compromised respiratory system and was on a ventilator for extended period, extubated 1/23. Bedside swallow evaluation today revealed Pt to be awake but distracted and unable to follow any verbal commands. Pt would not open his mouth for oral mech exam nor oral care but did open for bites and sips of intake. Pt tolerated bites of applesauce with no s/s of aspiration. Noted slow oral transit along with mild oral residue. No immediate s/s of aspiration after thin by tsp or nectar thick although oral transit was slow and delayed throat clearing indicating possible pharyngeal residue or laryngeal penetration. No solids tested secondary to fatigue and no teeth or dentures present. Rec Dysphagia 1 diet with HONEY thick liquids only to be fed when fully alert, strict aspiration precautions. Crushable med to be given in applesauce. Discontinue feeding of any s/s of aspiration or respiratory difficulty. MD and Nsg made aware of recs. ST  to follow and advance diet as indicated.  SLP Visit Diagnosis: Dysphagia, oropharyngeal phase (R13.12)    Aspiration Risk  Moderate aspiration risk    Diet Recommendation Dysphagia 1 (Puree);Honey-thick liquid   Medication Administration: Crushed with puree Supervision: Full supervision/cueing for compensatory strategies Compensations: Minimize environmental distractions;Slow rate;Small sips/bites;Other (Comment)(periods of rest during meals) Postural Changes: Seated upright at 90 degrees;Remain upright for at least 30 minutes after po intake    Other  Recommendations Oral Care Recommendations: Staff/trained caregiver to provide oral care   Follow up Recommendations        Frequency and Duration min 3x week  1 week       Prognosis Prognosis for Safe Diet Advancement: Fair Barriers to Reach Goals: Cognitive deficits      Swallow Study   General Date of Onset: 07/28/19 Type of Study: Bedside Swallow Evaluation Diet Prior to this Study: NPO Temperature Spikes Noted: No Respiratory Status: Room air;Other (comment);Nasal cannula(Nasal canula sitting on bed, placed on pt, set on 2 liters) History of Recent Intubation: Yes Length of Intubations (days): 14 days Date extubated: 08/11/19 Behavior/Cognition: Cooperative;Pleasant mood;Confused;Distractible;Requires cueing;Doesn't follow directions Oral Cavity Assessment: Dried secretions;Dry Oral Care Completed by SLP: Other (Comment)(Attempted but Pt pursed his lips tight) Oral Cavity - Dentition: Edentulous Vision: Impaired for self-feeding Self-Feeding Abilities: Total assist Patient Positioning: Upright in bed Baseline Vocal Quality: Breathy;Low vocal intensity Volitional Cough: Cognitively unable to elicit Volitional Swallow: Unable to elicit    Oral/Motor/Sensory Function Overall Oral Motor/Sensory Function: Moderate impairment   Ice Chips Ice chips: Impaired(Needed 2-3 to elicit a swallow) Presentation: Spoon Oral Phase  Impairments: Poor awareness  of bolus Oral Phase Functional Implications: Prolonged oral transit   Thin Liquid Thin Liquid: Impaired Presentation: Spoon Oral Phase Impairments: Poor awareness of bolus Pharyngeal  Phase Impairments: Throat Clearing - Delayed    Nectar Thick Nectar Thick Liquid: Within functional limits Presentation: Spoon;Cup Other Comments: (Noted delayed throat clear after a few boluses)   Honey Thick     Puree Puree: Impaired(Dealayed oral transit, fatigue) Presentation: Spoon Oral Phase Impairments: Poor awareness of bolus Oral Phase Functional Implications: Prolonged oral transit;Oral residue   Solid     Solid: Not tested      Lucila Maine 08/13/2019,12:41 PM

## 2019-08-13 NOTE — Progress Notes (Signed)
PT Cancellation Note  Patient Details Name: Antonio [name] MRN: [id number] DOB: 29-May-1946   Cancelled Treatment:    Reason Eval/Treat Not Completed: Patient's level of consciousness(Chart reviewed, RN/SLP consulted. SLP reports recently evaluating patient, mentaion quite limited, no following simple commands, not participating in a meaningful way at this time. WIll hold PT evalaution and attempt again at later date.)  2:42 PM, 08/13/19 Etta Grandchild, PT, DPT Physical Therapist - [facility]  [phone] ([place])    [place] C 08/13/2019, 2:42 PM

## 2019-08-13 NOTE — Progress Notes (Signed)
Nutrition Follow-up  DOCUMENTATION CODES:   Obesity unspecified  INTERVENTION:  Provide honey-thick Mighty Shake po TID with meals, each supplement provides 200 kcal and 7 grams of protein.  Provide Magic cup TID with meals, each supplement provides 290 kcal and 9 grams of protein.  NUTRITION DIAGNOSIS:   Inadequate oral intake related to inability to eat as evidenced by NPO status.  Resolving - diet advanced today.  GOAL:   Patient will meet greater than or equal to 90% of their needs  Progressing.  MONITOR:   PO intake, Supplement acceptance, Diet advancement, Labs, Weight trends, I & O's  REASON FOR ASSESSMENT:   Ventilator    ASSESSMENT:   74 year old male admitted with acute hypoxic and hypercapnic respiratory failure from acute systolic/diastolic heart failure requiring intubation on 1/8, also with sepsis and UTI.  Patient was extubated on 1/23. He was unable to provide any history today. Following SLP evaluation today diet was advanced to dysphagia 1 with honey-thick liquids. Will monitor for adequacy of intake.   Medications reviewed and include: folic acid 1 mg daily IV, Lasix 40 mg daily IV, Novolog 0-15 units Q4hrs, thiamine 100 mg daily IV, D5W at 50 mL/hr, famotidine.  Labs reviewed: CBG 109-146, Sodium 155.  Discussed with RN and on rounds.  Diet Order:   Diet Order            DIET - DYS 1 Room service appropriate? Yes; Fluid consistency: Honey Thick  Diet effective now             EDUCATION NEEDS:   No education needs have been identified at this time  Skin:  Skin Assessment: Skin Integrity Issues:(MSAD to groin)  Last BM:  08/12/2019 large type 7  Height:   Ht Readings from Last 1 Encounters:  08/08/19 5' 7.01" (1.702 m)   Weight:   Wt Readings from Last 1 Encounters:  08/13/19 104 kg   Ideal Body Weight:  67.3 kg  BMI:  Body mass index is 35.9 kg/m.  Estimated Nutritional Needs:   Kcal:  2200-2400  Protein:  110-120  grams  Fluid:  1.7-2 L/day  Jacklynn Barnacle, MS, RD, LDN Office: [phone] Pager: [phone] After Hours/Weekend Pager: [phone]

## 2019-08-13 NOTE — Progress Notes (Signed)
Pharmacy Antibiotic Note  Antonio [name] is a morbidly obese 74 y.o. male admitted on 07/27/2019. EMS stated that he collapsed on the stretcher and became unresponsive. Pt was apneic, but still had a pulse. Pt was extubated on 1/23. Vancomycin and ceftriaxone doses were increased and given for possible meningitis as stated by neurologist. No plan for LP at this time. Pt is afebrile and WBCs are WNL. Pharmacy has been consulted for vancomycin and ceftriaxone dosing.  Plan: D/C ceftriaxone 2g IV Q12H D/C vancomycin 1500mg  IV Q24H No longer suspicion for meningitis based on cultures and clinical presentation. Ceftriaxone and vancomycin were given for a total duration of 6 days.  D/C fluconazole 100mg  IV  Height: 5' 7.01" (170.2 cm) Weight: 229 lb 4.5 oz (104 kg) IBW/kg (Calculated) : 66.12  Temp (24hrs), Avg:99 F (37.2 C), Min:98.4 F (36.9 C), Max:99.8 F (37.7 C)  Recent Labs  Lab 08/09/19 0438 08/09/19 0438 08/10/19 0406 08/10/19 0406 08/10/19 1402 08/11/19 0406 08/11/19 1419 08/12/19 0307 08/12/19 1701 08/13/19 0415  WBC 13.7*  --  10.4  --   --  10.3  --  8.3  --  9.3  CREATININE 0.97   < > 0.95   < > 0.96 0.87 0.91 0.79  --  1.02  VANCOPEAK  --   --   --   --   --   --   --   --  45*  --    < > = values in this interval not displayed.    Estimated Creatinine Clearance: 73.1 mL/min (by C-G formula based on SCr of 1.02 mg/dL).    Not on File  Antimicrobials this admission: Ciprofloxacin 400mg  IV 01/10 >> 01/11 Ceftriaxone 1g IV 01/11 >> 01/12 Unasyn 3g IV 01/13 >> 01/15 Zosyn 3.375mg  IV 01/20 >> 01/21 Vancomycin 1250mg  IV (+loading dose on day 1) 01/20 >> 01/22 Vancomycin 1500mg  IV 01/23 >> 01/24 Ceftriaxone 2g IV 01/21 >> 1/25 Fluconazole 100mg  IV [id number] >> 01/24  Dose adjustments this admission: NA  Microbiology results: 01/20 BCx: No growth for 5 days  01/20 BCx: No growth for 5 days 01/20 Respiratory Culture (Tracheal Aspirate): Rare Staphylococcus Aureus   01/22 Respiratory Culture (Bronchial Alveolar Lavage): Rare consistent with normal respiratory flora 01/20 MRSA PCR: Negative 01/08 SARS Coronavirus by PCR: Negative 01/08 Influenza A/B by PCR: Negative  Thank you for allowing pharmacy to be a part of this patient's care.  Roanna Banning 08/13/2019 10:55 AM

## 2019-08-13 NOTE — Consult Note (Signed)
PHARMACY CONSULT NOTE - FOLLOW UP  Pharmacy Consult for Electrolyte Monitoring and Replacement   Antonio [name] is a morbidly obese 74 y.o. male admitted on 07/27/2019. EMS stated that he collapsed on the stretcher and became unresponsive. Pt was apneic, but still had a pulse. Pt has AKI, severe respiratory distress, and acute respiratory failure. Pt is extubated and improving. Desmopressin will be initiated to reduce sodium levels, along with free water flushes. Pharmacy consulted to assist with replacing and monitoring electrolytes.   Recent Labs: Potassium (mmol/L)  Date Value  08/13/2019 3.8   Magnesium (mg/dL)  Date Value  08/13/2019 3.0 (H)   Calcium (mg/dL)  Date Value  08/13/2019 8.5 (L)   Albumin (g/dL)  Date Value  08/08/2019 2.9 (L)   Phosphorus (mg/dL)  Date Value  08/11/2019 2.9   Sodium (mmol/L)  Date Value  08/13/2019 156 (H)    Assessment: 1. Electrolytes: No changes in sodium levels since yesterday, despite the free water flushes. MD recommended desmopressin x 1 on 1/22 to help reduce sodium levels along with free water flushes. Starting DDAVP BID per Dr. Loni Muse on 1/23. ADH lab ordered - in process. Team plans to give pt DDAVP 20 mg x 1 on 1/24 and increased DDAVP to 0.52 mg TID.  Continue monitoring electrolytes in am labs. Will give Kcl 40 mEq x 1 PO. Will replace to achieve goal potassium ~4, sodium ~140, and magnesium ~2.   1/24: Na @ 0700 = 155            Na @ 1200 = 165           Na @ 1600 = 147         - notified Dr Lanney Gins of rapid drop in Na, he feels this reflects DDAVP 20 mcg given @ 1336.  Will recheck Na @ 2000.  If Na continues to drop, will hold DDAVP.   1/24: Na @ 2000 = 155    -  Na has bounced back to where it was this AM @ 0700 ; effectively, there has been a zero net change in Na in past 13 hrs.   Will continue with DDAVP and monitor Na Q4H.  1/24: Na @ [id number] = 155, no change.  As above.   1/25: Na @ 0415 = 156, continue current plan, f/u  labs as ordered.   2. Glucose: Today's range: 88-92. Glucose levels are lower than yesterday. Glucose-capillary monitored Q4H. Continue insulin aspart 0-15 [place] Q4H and monitor glucose daily.   3. Constipation: Last BM: 01/23. Continue monitoring BM and add constipation-relief meds if constipation recurs.    Hart Robinsons A PharmD, BCPS Clinical Pharmacist 08/13/2019 4:43 AM

## 2019-08-13 NOTE — Consult Note (Signed)
PHARMACY CONSULT NOTE - FOLLOW UP  Pharmacy Consult for Electrolyte Monitoring and Replacement   Antonio Hornadayis amorbidlyobese73 y.o.maleadmittedon 07/27/2019.EMS stated that he collapsed on the stretcher and became unresponsive. Pt was apneic, but still had a pulse. Pt has AKI, severe respiratory distress, and acute respiratory failure. Pt is extubated and improving. Desmopressin was initiated to reduce sodium levels, but showed no improvements. D5W initated to help lower Na+ levels. Per am rounds, sodium remains elevated and pt is confused. Pharmacy consulted to assist with replacing and monitoring electrolytes.  Recent Labs: Potassium (mmol/L)  Date Value  08/13/2019 3.8   Magnesium (mg/dL)  Date Value  08/13/2019 3.0 (H)   Calcium (mg/dL)  Date Value  08/13/2019 8.5 (L)   Albumin (g/dL)  Date Value  08/08/2019 2.9 (L)   Phosphorus (mg/dL)  Date Value  08/11/2019 2.9   Sodium (mmol/L)  Date Value  08/13/2019 158 (H)     Assessment: 1. Electrolytes: Sodium levels have been consistent in the 150s range. There was a slight decrease to 147 yesterday, but the levels have increased today. Desmopressin has been stopped. D5W was initiated to help reduce sodium levels. Magnesium is higher than values reported on 01/23. Corrected calcium is 9.38 (WNL). Will continue to monitor electrolytes in am labs. Sodium levels monitored Q12H. Will replace to achieve goal potassium ~4, sodium ~140, and magnesium ~2.  2. Glucose: Today's range: 90-110. Glucose levels are slightly higher than yesterday. Glucose-capillary monitored Q4H. Pt is on insulin aspart 0-15 units [employer] Q6H. Continue insulin administrations and monitor glucose levels daily.   3. Constipation: Last BM: 01/24. Continue to monitor BM and add constipation-relief meds if constipation recurs.     Roanna Banning ,PharmD Clinical Pharmacist 08/13/2019 11:19 AM

## 2019-08-13 NOTE — Consult Note (Signed)
PHARMACY CONSULT NOTE - FOLLOW UP  Pharmacy Consult for Electrolyte Monitoring and Replacement   Antonio [name] is a morbidly obese 74 y.o. male admitted on 07/27/2019. EMS stated that he collapsed on the stretcher and became unresponsive. Pt was apneic, but still had a pulse. Pt has AKI, severe respiratory distress, and acute respiratory failure. Pt is extubated and improving. Desmopressin will be initiated to reduce sodium levels, along with free water flushes. Pharmacy consulted to assist with replacing and monitoring electrolytes.   Recent Labs: Potassium (mmol/L)  Date Value  08/12/2019 3.8   Magnesium (mg/dL)  Date Value  08/11/2019 2.6 (H)   Calcium (mg/dL)  Date Value  08/12/2019 8.4 (L)   Albumin (g/dL)  Date Value  08/08/2019 2.9 (L)   Phosphorus (mg/dL)  Date Value  08/11/2019 2.9   Sodium (mmol/L)  Date Value  08/12/2019 155 (H)    Assessment: 1. Electrolytes: No changes in sodium levels since yesterday, despite the free water flushes. MD recommended desmopressin x 1 on 1/22 to help reduce sodium levels along with free water flushes. Starting DDAVP BID per Dr. Loni Muse on 1/23. ADH lab ordered - in process. Team plans to give pt DDAVP 20 mg x 1 on 1/24 and increased DDAVP to 0.52 mg TID.  Continue monitoring electrolytes in am labs. Will give Kcl 40 mEq x 1 PO. Will replace to achieve goal potassium ~4, sodium ~140, and magnesium ~2.   1/24: Na @ 0700 = 155            Na @ 1200 = 165           Na @ 1600 = 147         - notified Dr Lanney Gins of rapid drop in Na, he feels this reflects DDAVP 20 mcg given @ 1336.  Will recheck Na @ 2000.  If Na continues to drop, will hold DDAVP.   1/24: Na @ 2000 = 155    -  Na has bounced back to where it was this AM @ 0700 ; effectively, there has been a zero net change in Na in past 13 hrs.   Will continue with DDAVP and monitor Na Q4H.  1/24: Na @ [id number] = 155, no change.  As above.     2. Glucose: Today's range: 88-92. Glucose levels  are lower than yesterday. Glucose-capillary monitored Q4H. Continue insulin aspart 0-15  Q4H and monitor glucose daily.   3. Constipation: Last BM: 01/23. Continue monitoring BM and add constipation-relief meds if constipation recurs.    Hart Robinsons A PharmD, BCPS Clinical Pharmacist 08/13/2019 12:35 AM

## 2019-08-13 NOTE — Progress Notes (Signed)
CRITICAL CARE NOTE  Patient description: Antonio [name] is a 74 yo male with a history of alcohol abuse, tobacco abuse and suspected COPD who presented to the ED on 07/27/2019 as a [name] after being found down by EMS.  He was admitted to the ICU in acute respiratory distress, intubated and sedated.  On further workup, he was found to have a grade I diastolic dysfunction, moderate aortic stenosis and AKI.     Lines / Drains: Midline single lumen left cephalic PIV right forearm NG/OG tube Urethral catheter Rectal tube ET tube x 9 days  Cultures / Sepsis markers: Respiratory culture 1/11 consistent with normal respiratory flora.  Negative for MRSA, Influenza A/B, Sars-cov-2.  Blood cultures 1/8 showed no growth at 5 days.    Patient remains afebrile at 98.20F.     Protocols / Consultants: CIWA protocol  Tests / Events: 1/9 severe resp failure acute sCHF, intubated 1/8 1/10 SAT/SBT pending, +UTI continue ABX 1/11 patient failed wean due to agitation and increased work of breathing 1/12 patient failed weaning trial 1/13 bradycardia at 48 bpm. No elevation in troponin. Family discussion-patient is DNR  1/14 failed SAT/SBT due to severe delirium and WOB 1/16 Slow to awaken but no asynchrony with sedation vacation 1/17developed A. fib flutter with controlled ventricular response 1/18 patient remains in anasarca - now on lasix gtt 1/19 MRI brain with no acute CVA but + right temporal encephalomalacia and a meningioma found incidentally 1/20- patient continues to have altered mentation and fever this am. Repeating septic workup.  EEG today. MRI brain yesterday no acute findings. Neuro consultation 1/21-patient mildly improved in terms of mentation, discussed case with neurology today.  Possible LP 1/22- patient is improving wbc count normalized, mentation improved.  Discussed with Dr Doy Mince. Hypernatremia is refractory to 350W push q4h via OGT, will do one time DDAVP challenge  with repeat BMP 1/23-patient has passed weanig trial , mentation improved.  Successfully weaned from MV.  08/12/19 - patient responds well to desmopressin in terms of mentation, when NA incremented up he became confused again, after his DDAVP dose this morning he again became more lucid.     CC  follow up respiratory failure  SUBJECTIVE Prognosis is guarded Extubated 1/23 Slight increased WOB Na is 156   CBC    Component Value Date/Time   WBC 9.3 08/13/2019 0415   RBC 3.81 (L) 08/13/2019 0415   HGB 12.0 (L) 08/13/2019 0415   HCT 39.5 08/13/2019 0415   PLT 283 08/13/2019 0415   MCV 103.7 (H) 08/13/2019 0415   MCH 31.5 08/13/2019 0415   MCHC 30.4 08/13/2019 0415   RDW 13.7 08/13/2019 0415   LYMPHSABS 1.3 08/12/2019 0307   MONOABS 0.6 08/12/2019 0307   EOSABS 0.3 08/12/2019 0307   BASOSABS 0.1 08/12/2019 0307   BMP Latest Ref Rng & Units 08/13/2019 08/12/2019 08/12/2019  Glucose 70 - 99 mg/dL 110(H) - -  BUN 8 - 23 mg/dL 41(H) - -  Creatinine 0.61 - 1.24 mg/dL 1.02 - -  Sodium 135 - 145 mmol/L 156(H) 155(H) 155(H)  Potassium 3.5 - 5.1 mmol/L 3.8 - -  Chloride 98 - 111 mmol/L 119(H) - -  CO2 22 - 32 mmol/L 28 - -  Calcium 8.9 - 10.3 mg/dL 8.5(L) - -      BP (!) 150/42   Pulse 84   Temp 99.3 F (37.4 C) (Axillary)   Resp (!) 29   Ht 5' 7.01" (1.702 m)   Wt 104 kg  SpO2 99%   BMI 35.90 kg/m    I/O last 3 completed shifts: In: 750 [IV Piggyback:750] Out: 2275 [Urine:2275] No intake/output data recorded.  SpO2: 99 % O2 Flow Rate (L/min): 2 L/min FiO2 (%): 28 %     REVIEW OF SYSTEMS  PATIENT IS UNABLE TO PROVIDE COMPLETE REVIEW OF SYSTEMS DUE TO SEVERE CRITICAL ILLNESS   PHYSICAL EXAMINATION:  GENERAL:critically ill appearing, HEAD: Normocephalic, atraumatic.  EYES: Pupils equal, round, reactive to light.  No scleral icterus.  MOUTH: Moist mucosal membrane. NECK: Supple.  PULMONARY: +rhonchi, CARDIOVASCULAR: S1 and S2. Regular rate and rhythm. No  murmurs, rubs, or gallops.  GASTROINTESTINAL: Soft, nontender, -distended.  Positive bowel sounds.   MUSCULOSKELETAL: No swelling, clubbing, or edema.  NEUROLOGIC: confused SKIN:intact,warm,dry  MEDICATIONS: I have reviewed all medications and confirmed regimen as documented   CULTURE RESULTS   Recent Results (from the past 240 hour(s))  CULTURE, BLOOD (ROUTINE X 2) w Reflex to ID Panel     Status: None (Preliminary result)   Collection Time: 08/08/19  9:27 AM   Specimen: BLOOD  Result Value Ref Range Status   Specimen Description BLOOD RIGHT FOOT  Final   Special Requests   Final    BOTTLES DRAWN AEROBIC AND ANAEROBIC Blood Culture adequate volume   Culture   Final    NO GROWTH 4 DAYS Performed at [facility], [address] [place], [place] [address]    Report Status PENDING  Incomplete  CULTURE, BLOOD (ROUTINE X 2) w Reflex to ID Panel     Status: None (Preliminary result)   Collection Time: 08/08/19  9:27 AM   Specimen: BLOOD  Result Value Ref Range Status   Specimen Description BLOOD LEFT FOOT  Final   Special Requests   Final    BOTTLES DRAWN AEROBIC AND ANAEROBIC Blood Culture results may not be optimal due to an excessive volume of blood received in culture bottles   Culture   Final    NO GROWTH 4 DAYS Performed at [facility], [address], [place], [place] [address]    Report Status PENDING  Incomplete  MRSA PCR Screening     Status: None   Collection Time: 08/08/19  9:43 AM   Specimen: Nasopharyngeal  Result Value Ref Range Status   MRSA by PCR NEGATIVE NEGATIVE Final    Comment:        The GeneXpert MRSA Assay (FDA approved for NASAL specimens only), is one component of a comprehensive MRSA colonization surveillance program. It is not intended to diagnose MRSA infection nor to guide or monitor treatment for MRSA infections. Performed at [facility], [address] [place], [place] [address]   Culture, respiratory  (non-expectorated)     Status: None   Collection Time: 08/08/19  5:01 PM   Specimen: Tracheal Aspirate; Respiratory  Result Value Ref Range Status   Specimen Description   Final    TRACHEAL ASPIRATE Performed at [facility], [address] [place], [place] [address]    Special Requests   Final    NONE Performed at [facility], [address] [place], [place] [address]    Gram Stain   Final    RARE WBC PRESENT, PREDOMINANTLY PMN RARE GRAM POSITIVE COCCI IN PAIRS Performed at [facility], [place] [address] [place], [place] [address]    Culture RARE STAPHYLOCOCCUS AUREUS  Final   Report Status 08/12/2019 FINAL  Final   Organism ID, Bacteria STAPHYLOCOCCUS AUREUS  Final      Susceptibility   Staphylococcus aureus - MIC*    CIPROFLOXACIN <=0.5 SENSITIVE Sensitive     ERYTHROMYCIN <=0.25 SENSITIVE Sensitive     GENTAMICIN <=0.5 SENSITIVE Sensitive     OXACILLIN 0.5 SENSITIVE Sensitive     TETRACYCLINE <=1 SENSITIVE Sensitive     VANCOMYCIN 1 SENSITIVE Sensitive     TRIMETH/SULFA <=10 SENSITIVE Sensitive     CLINDAMYCIN <=0.25 SENSITIVE Sensitive     RIFAMPIN <=0.5 SENSITIVE Sensitive     Inducible Clindamycin NEGATIVE Sensitive     * RARE STAPHYLOCOCCUS AUREUS  Culture, respiratory (non-expectorated)     Status: None   Collection Time: 08/10/19 10:04 AM   Specimen: Bronchoalveolar Lavage; Respiratory  Result Value Ref Range Status   Specimen Description   Final    BRONCHIAL ALVEOLAR LAVAGE Performed at [facility], [address], [place], [place] [address]    Special Requests   Final    NONE Performed at [facility], [address] [place], [place] [address]    Gram Stain NO WBC SEEN NO ORGANISMS SEEN   Final   Culture   Final    RARE Consistent with normal respiratory flora. Performed at [facility], [place] [address], [place], [place] [address]    Report Status 08/12/2019 FINAL  Final           ASSESSMENT  AND PLAN SYNOPSIS   Severe ACUTE Hypoxic and Hypercapnic Respiratory Failure COPD exacerbation, ETOH ABUSE Follow Resp status   ACUTE DIASTOLIC CARDIAC FAILURE-  -oxygen as needed -Lasix as tolerated   NEUROLOGY Confused Hypernatremia    CARDIAC ICU monitoring  ID -continue IV abx as prescibed -follow up cultures  GI GI PROPHYLAXIS as indicated  NUTRITIONAL STATUS DIET-->NPO for now Constipation protocol as indicated  ENDO - will use ICU hypoglycemic\Hyperglycemia protocol if indicated   ELECTROLYTES -follow labs as needed -replace as needed -pharmacy consultation and following   DVT/GI PRX ordered TRANSFUSIONS AS NEEDED MONITOR FSBS ASSESS the need for LABS as needed  PROGNOSIS IS POOR SD STATUS  DNR STATUS for now   Antonio [name], M.D.  [name]  Medical Director [place] Director [place] Department

## 2019-08-14 DIAGNOSIS — R918 Other nonspecific abnormal finding of lung field: Secondary | ICD-10-CM

## 2019-08-14 DIAGNOSIS — J984 Other disorders of lung: Secondary | ICD-10-CM

## 2019-08-14 LAB — CBC
HCT: 39.7 % (ref 39.0–52.0)
Hemoglobin: 12 g/dL — ABNORMAL LOW (ref 13.0–17.0)
MCH: 31.3 pg (ref 26.0–34.0)
MCHC: 30.2 g/dL (ref 30.0–36.0)
MCV: 103.7 fL — ABNORMAL HIGH (ref 80.0–100.0)
Platelets: 281 10*3/uL (ref 150–400)
RBC: 3.83 MIL/uL — ABNORMAL LOW (ref 4.22–5.81)
RDW: 13.9 % (ref 11.5–15.5)
WBC: 7.7 10*3/uL (ref 4.0–10.5)
nRBC: 0 % (ref 0.0–0.2)

## 2019-08-14 LAB — GLUCOSE, CAPILLARY
Glucose-Capillary: 126 mg/dL — ABNORMAL HIGH (ref 70–99)
Glucose-Capillary: 116 mg/dL — ABNORMAL HIGH (ref 70–99)
Glucose-Capillary: 123 mg/dL — ABNORMAL HIGH (ref 70–99)
Glucose-Capillary: 118 mg/dL — ABNORMAL HIGH (ref 70–99)
Glucose-Capillary: 119 mg/dL — ABNORMAL HIGH (ref 70–99)
Glucose-Capillary: 156 mg/dL — ABNORMAL HIGH (ref 70–99)
Glucose-Capillary: 133 mg/dL — ABNORMAL HIGH (ref 70–99)
Glucose-Capillary: 109 mg/dL — ABNORMAL HIGH (ref 70–99)

## 2019-08-14 LAB — RENAL FUNCTION PANEL
Albumin: 2.7 g/dL — ABNORMAL LOW (ref 3.5–5.0)
Anion gap: 5 (ref 5–15)
BUN: 41 mg/dL — ABNORMAL HIGH (ref 8–23)
CO2: 30 mmol/L (ref 22–32)
Calcium: 8.4 mg/dL — ABNORMAL LOW (ref 8.9–10.3)
Chloride: 118 mmol/L — ABNORMAL HIGH (ref 98–111)
Creatinine, Ser: 0.96 mg/dL (ref 0.61–1.24)
GFR calc Af Amer: >60 mL/min (ref >60)
GFR calc non Af Amer: >60 mL/min (ref >60)
Glucose, Bld: 128 mg/dL — ABNORMAL HIGH (ref 70–99)
Phosphorus: 3.7 mg/dL (ref 2.5–4.6)
Potassium: 3.2 mmol/L — ABNORMAL LOW (ref 3.5–5.1)
Sodium: 153 mmol/L — ABNORMAL HIGH (ref 135–145)

## 2019-08-14 LAB — AMMONIA: Ammonia: 15 umol/L (ref 9–35)

## 2019-08-14 LAB — MAGNESIUM: Magnesium: 2.8 mg/dL — ABNORMAL HIGH (ref 1.7–2.4)

## 2019-08-14 MED ORDER — INSULIN ASPART 100 UNIT/ML ~~LOC~~ SOLN: 0.0000 [IU] | Freq: Every day | SUBCUTANEOUS | Status: DC

## 2019-08-14 MED ORDER — FAMOTIDINE 20 MG PO TABS
20.0000 mg | ORAL_TABLET | Freq: Two times a day (BID) | ORAL | Status: DC
  Administered 2019-08-14 – 2019-08-20 (×7): 20 mg via ORAL
  Filled 2019-08-14 (×9): qty 1

## 2019-08-14 MED ORDER — FOLIC ACID 1 MG PO TABS
1.0000 mg | ORAL_TABLET | Freq: Every day | ORAL | Status: DC
  Administered 2019-08-16 – 2019-08-20 (×5): 1 mg via ORAL
  Filled 2019-08-14 (×5): qty 1

## 2019-08-14 MED ORDER — INSULIN ASPART 100 UNIT/ML ~~LOC~~ SOLN
0.0000 [IU] | Freq: Three times a day (TID) | SUBCUTANEOUS | Status: DC
  Administered 2019-08-14: 12:00:00 3 [IU] via SUBCUTANEOUS
  Administered 2019-08-14: 2 [IU] via SUBCUTANEOUS
  Filled 2019-08-14 (×2): qty 1

## 2019-08-14 MED ORDER — POTASSIUM CHLORIDE 10 MEQ/50ML IV SOLN
10.0000 meq | INTRAVENOUS | Status: DC
  Administered 2019-08-14: 09:00:00 10 meq via INTRAVENOUS
  Filled 2019-08-14 (×4): qty 50

## 2019-08-14 MED ORDER — POTASSIUM CHLORIDE 2 MEQ/ML IV SOLN
INTRAVENOUS | Status: DC
  Filled 2019-08-14 (×8): qty 1000

## 2019-08-14 MED ORDER — THIAMINE HCL 100 MG PO TABS
100.0000 mg | ORAL_TABLET | Freq: Every day | ORAL | Status: DC
  Administered 2019-08-16 – 2019-08-20 (×5): 100 mg via ORAL
  Filled 2019-08-14 (×5): qty 1

## 2019-08-14 NOTE — Consult Note (Signed)
PHARMACY CONSULT NOTE - FOLLOW UP  Pharmacy Consult for Electrolyte Monitoring and Replacement   Antonio [name] is a morbidly obese 74 y.o. male admitted on 07/27/2019. EMS stated that he collapsed on the stretcher and became unresponsive. Pt was apneic, but still had a pulse. Pt has AKI, severe respiratory distress and acute respiratory failure. Pt is extubated and improving. D5W with potassium infusion was started today. Per am rounds, Pt seems to be improving eating and drinking. Pharmacy consulted to assist with monitoring and replacing electrolytes.    Recent Labs: Potassium (mmol/L)  Date Value  08/14/2019 3.2 (L)   Magnesium (mg/dL)  Date Value  08/14/2019 2.8 (H)   Calcium (mg/dL)  Date Value  08/14/2019 8.4 (L)   Albumin (g/dL)  Date Value  08/14/2019 2.7 (L)   Phosphorus (mg/dL)  Date Value  08/14/2019 3.7   Sodium (mmol/L)  Date Value  08/14/2019 153 (H)     Assessment: 1. Electrolytes: Sodium levels have slightly improved, but still in 150s. Started D5W yesterday. Replaced that with D5W with potassium 40 mEq infusion today which will also help pt's low potassium levels. Pt was also given furosemide 40mg  IV daily. Also received 19mEq potassium IV x 1 dose today.   Corrected calcium is 9.44 (WNL). Magnesium has improved slightly. Continue to monitor electrolytes in am labs. Will replace to achieve goal for potassium ~4, sodium ~140, and magnesium ~2.   2. Glucose: Today's range: 116-128. Glucose levels are lower than yesterday. Pt is on insulin aspart 0-15 units [place] three times a day with meals and insulin aspart 0-5 units [place] daily at bedtime. Continue monitoring pt's eating/drinking habits, continue insulin administrations, and monitor glucose daily.   3. Constipation: Last BM: 1/25. Pt is not on any meds for constipation. Continue to monitor and add constipation-relief meds if constipation occurs.    Roanna Banning ,PharmD Candidate 08/14/2019 11:03 AM

## 2019-08-14 NOTE — Evaluation (Signed)
Physical Therapy Evaluation Patient Details Name: Antonio [name] MRN: [id number] DOB: 1946-07-13 Today's Date: 08/14/2019   History of Present Illness  Antonio [name] is a 74 yo male with a history of alcohol abuse, tobacco abuse and suspected COPD who presented to the ED on 07/27/2019 after being found down by EMS.  He was admitted to the ICU in acute respiratory distress, intubated and sedated.  On further workup, he was found to have a grade I diastolic dysfunction, moderate aortic stenosis and AKI.  Extubated on 08/11/2019. MRI of the brain reviewed and shows an incidental small meningioma and left temporal encephalomalcia but no etiology for mental status.  Clinical Impression  Pt admitted with above diagnosis. Pt currently with functional limitations due to the deficits listed below (see "PT Problem List"). Upon entry, pt in bed, awake, somewhat interactive, but rarely in a meaningful way. The pt is alert and oriented to self, pleasant, flat affect, and answers no history questioning. Pt received in soiled bed, codes yellow and brown combined, author assisted RN with linen change/pericare. Pt given extensive cues for rolling/limb movement, but he is weak and struggles to follow cues. Pt makes a handful of comments during process that illuminates other avenues of cognitive limitations, poor awareness of situation, deficits, poor problem solving. Functional mobility assessment demonstrates increased effort/time requirements, poor tolerance, and need for physical assistance, whereas the patient performed these at a higher level of independence PTA. Pt will benefit from skilled PT intervention to increase independence and safety with basic mobility in preparation for discharge to the venue listed below.       Follow Up Recommendations SNF;Supervision for mobility/OOB;Supervision - Intermittent    Equipment Recommendations  None recommended by PT    Recommendations for Other Services        Precautions / Restrictions Precautions Precautions: Fall Precaution Comments: RUE edematous, ? IV infiltration Restrictions Weight Bearing Restrictions: No      Mobility  Bed Mobility Overal bed mobility: Needs Assistance Bed Mobility: Rolling Rolling: +2 for safety/equipment;+2 for physical assistance         General bed mobility comments: Author assisted RN for pericare and linen changes; pt soiled bed. Very much too weak to attempt to EOB.  Transfers                    Ambulation/Gait                Stairs            Wheelchair Mobility    Modified Rankin (Stroke Patients Only)       Balance Overall balance assessment: Needs assistance Sitting-balance support: Bilateral upper extremity supported Sitting balance-Leahy Scale: Zero                                       Pertinent Vitals/Pain Pain Assessment: No/denies pain(appears comfortable.)    Home Living Family/patient expects to be discharged to:: (No home information availabel at this time, pt admitted as [facility], since confirmed identity by pt's son)                      Prior Function                 Hand Dominance        Extremity/Trunk Assessment   Upper Extremity Assessment Upper Extremity Assessment: Generalized weakness(minimal active movement of limbs, appears  to have partial ROM with gravity eliminated. Not following commands for testing.)    Lower Extremity Assessment Lower Extremity Assessment: Generalized weakness(minimal active movement of limbs, appears to have partial ROM with gravity eliminated. Not following commands for testing.)       Communication      Cognition     Overall Cognitive Status: Impaired/Different from baseline Area of Impairment: Orientation;Attention;Following commands;Safety/judgement;Problem solving                 Orientation Level: Person Current Attention Level: Sustained   Following  Commands: Follows one step commands inconsistently;Follows one step commands with increased time Safety/Judgement: Decreased awareness of safety;Decreased awareness of deficits   Problem Solving: Slow processing;Decreased initiation;Difficulty sequencing;Requires verbal cues;Requires tactile cues        General Comments      Exercises     Assessment/Plan    PT Assessment Patient needs continued PT services  PT Problem List Decreased strength;Decreased cognition;Decreased range of motion;Decreased activity tolerance;Decreased balance;Decreased mobility;Decreased coordination;Cardiopulmonary status limiting activity;Obesity       PT Treatment Interventions DME instruction;Balance training;Gait training;Neuromuscular re-education;Stair training;Cognitive remediation;Functional mobility training;Patient/family education;Therapeutic activities;Wheelchair mobility training;Therapeutic exercise;Manual techniques    PT Goals (Current goals can be found in the Care Plan section)  Acute Rehab PT Goals PT Goal Formulation: Patient unable to participate in goal setting Time For Goal Achievement: 09/04/19    Frequency Min 2X/week   Barriers to discharge Inaccessible home environment;Decreased caregiver support no known home information at this time.    Co-evaluation               AM-PAC PT "6 Clicks" Mobility  Outcome Measure Help needed turning from your back to your side while in a flat bed without using bedrails?: Total Help needed moving from lying on your back to sitting on the side of a flat bed without using bedrails?: Total Help needed moving to and from a bed to a chair (including a wheelchair)?: Total Help needed standing up from a chair using your arms (e.g., wheelchair or bedside chair)?: Total Help needed to walk in hospital room?: Total Help needed climbing 3-5 steps with a railing? : Total 6 Click Score: 6    End of Session   Activity Tolerance: Patient limited  by fatigue;Patient limited by lethargy Patient left: in bed;with nursing/sitter in room;with bed alarm set Nurse Communication: Mobility status PT Visit Diagnosis: Difficulty in walking, not elsewhere classified (R26.2);Other abnormalities of gait and mobility (R26.89);Muscle weakness (generalized) (M62.81)    Time: [id number] PT Time Calculation (min) (ACUTE ONLY): 18 min   Charges:   PT Evaluation $PT Eval Moderate Complexity: 1 Mod          1:39 PM, 08/14/19 Etta Grandchild, PT, DPT Physical Therapist - [facility]  [phone] ([place])   Keavon Sensing C 08/14/2019, 1:36 PM

## 2019-08-14 NOTE — Progress Notes (Signed)
Pt became more alert through the shift. Is able to follow commands, eat, and talk. Pt is only oriented to self but follows instructions. Refuses mouth wash and nystatin oral medication. Flexiseal and foley catheter removed. Pt is voiding incontinent. Family updated on POC. VSS, will continue to monitor.

## 2019-08-14 NOTE — Progress Notes (Addendum)
  Speech Language Pathology Treatment: Dysphagia  Patient Details Name: Antonio [name] MRN: [id number] DOB: [birth year] Today's Date: 08/14/2019 Time: [id number] SLP Time Calculation (min) (ACUTE ONLY): 40 min  Assessment / Plan / Recommendation Clinical Impression  Pt seen today for ongoing assessment of swallowing function; trials to upgrade diet consistency as indicated. Pt continues to present w/ encephalopathy; not following commands verbalizing consistently. Mod-Max verbal/tactile cues are required for follow through w/ tasks; and oral care(he bit on swabs). Noted decreased awareness for tasks overall which can impact safety w/ oral intake.  Pt was awake, distracted. Intermittently, pt verbalized and followed a command given verbal/tactile cues. He did not follow through w/ oral care w/ SLP but when po trials/straw were presented, pt opened mouth immediately to accept boluses/straw. Pt given trials of Honey and Nectar liquids via TSP, then sips of Nectar liquids via Straw. He demonstrated oral awareness during oral prep stage and accepted all trials. Oral phase c/b inconsistent oral hesitation and oral holding moreso as po trials continued -- unsure if d/t fatigue factor or losing interest in task. W/ gentle verbal cues and Time b/t trials, pt swallowed and cleared orally. Due to inconsistent, min oral residue b/t some trials of foods and drink supplements, trials of Puree and Nectar liquids were alternated to promote oral clearing as well. No overt clinical s/s of aspiration w/ trials of Nectar liquids via Straw, or Purees during session. No solids tested secondary to edentulous status.  Recommend a Dysphagia 1 (puree) diet with NECTAR consistency liquids; strict aspiration precautions including checking for oral clearing/strategies to promote oral clearing as posted; give po's when fully alert/attentive to task. Recommend Pills in Puree - Crushed. ST to continue to follow and advance diet as  indicated; objective study TBD. MD/NSG updated.        HPI  Pt is a 74 yo male with a history of alcohol abuse, tobacco abuse and suspected COPD who presented to the ED on 07/27/2019 after being found down by EMS.  He was admitted to the ICU for Acute Hypoxic and Hypercapnic Respiratory Failure from acute pulmonary edema; intubated and sedated.  On further workup, he was found to have a grade I diastolic dysfunction, moderate aortic stenosis and AKI.  Extubated on 08/11/2019. MRI of the brain reviewed and shows an incidental small meningioma and left temporal encephalomalcia but no etiology for mental status.       SLP Plan  Continue with current plan of care(f/u w/ MBSS TBD)       Recommendations  Diet recommendations: Dysphagia 1 (puree);Nectar-thick liquid Liquids provided via: Cup;Straw Medication Administration: Crushed with puree(for ease/safety of swallowing) Supervision: Staff to assist with self feeding;Full supervision/cueing for compensatory strategies Compensations: Minimize environmental distractions;Slow rate;Small sips/bites;Lingual sweep for clearance of pocketing;Multiple dry swallows after each bite/sip;Follow solids with liquid Postural Changes and/or Swallow Maneuvers: Seated upright 90 degrees;Upright 30-60 min after meal                General recommendations: (Dietician f/u) Oral Care Recommendations: Oral care BID;Oral care before and after PO;Staff/trained caregiver to provide oral care Follow up Recommendations: Skilled Nursing facility(TBD) SLP Visit Diagnosis: Dysphagia, oropharyngeal phase (R13.12)(Cognitive decline) Plan: Continue with current plan of care(f/u w/ MBSS TBD)       GO                 [name], MS, CCC-SLP [name] 08/14/2019, 2:31 PM

## 2019-08-14 NOTE — Progress Notes (Signed)
PHARMACIST - PHYSICIAN COMMUNICATION  CONCERNING: IV to Oral Route Change Policy  RECOMMENDATION: This patient is receiving famotidine, thiamine, and folic acid by the intravenous route.  Based on criteria approved by the Pharmacy and Therapeutics Committee, the intravenous medication(s) is/are being converted to the equivalent oral dose form(s).   DESCRIPTION: These criteria include:  The patient is eating (either orally or via tube) and/or has been taking other orally administered medications for a least 24 hours  The patient has no evidence of active gastrointestinal bleeding or impaired GI absorption (gastrectomy, short bowel, patient on TNA or NPO).  If you have questions about this conversion, please contact the Pharmacy Department at [phone].   Tova Vater L, RPH 08/14/2019 1:32 PM

## 2019-08-14 NOTE — Progress Notes (Signed)
PROGRESS NOTE    Antonio [name]  [id number] DOB: 08-07-1945 DOA: 07/27/2019 PCP: Patient, No Pcp Per   Brief Narrative:  Antonio [name] is a 74 yo male with a history of alcohol abuse, tobacco abuse and suspected COPD who presented to the ED on 07/27/2019 after being found down by EMS. He was admitted to the ICU in acute respiratory distress, intubated and sedated. On further workup, he was found to have a grade I diastolic dysfunction, moderate aortic stenosis and AKI.  Extubated on 08/11/2019.  Did develop resistant hypernatremia which did not respond to free water initially, started on desmopressin.  Subjective: Patient was little more responsive but not talking much.  Per nursing staff he talks whenever he wants and he was eating and drinking fine.  To become agitated at time.  Assessment & Plan:   Active Problems:   Respiratory failure (HCC)   Hypernatremia  Acute hypoxic and hypercapnic respiratory failure.  Initially treated for COPD exacerbation.  He was initially treated with multiple antibiotics in ICU which include Zosyn, vancomycin, ceftriaxone and Unasyn.  Now off antibiotics.  No UDS in the system.  Saturating well on RA.  No wheezing. -Continue DuoNeb treatment. -Can be transferred to [place]. -PT are recommending SNF placement.  Hypernatremia.  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  Did not responded well to desmopressin. -Started on D5 today. -Continue to monitor.  History of alcohol abuse.  Out of withdrawal now. -We will need counseling against alcohol once mental status improves. -Ativan as needed for agitation.  Encephalopathy.  Most likely secondary to serious illness and hypernatremia. Imaging and EEG was negative for any acute changes. Seems improving. -Continue to monitor. -Supportive care.  Acute diastolic heart failure.  Echo with grade 1 diastolic heart failure. -Continue Lasix 40 mg daily. -Monitor BMP. - intake and  output. -Daily weight  Hypertension.  Blood pressure within goal. -Continue amlodipine and metoprolol.  Objective: Vitals:   08/14/19 1144 08/14/19 1200 08/14/19 1300 08/14/19 1314  BP:  (!) 118/46 (!) 99/32 (!) 125/38  Pulse: 86 85 92 83  Resp: (!) 25 (!) 28 (!) 31 (!) 25  Temp: 99.3 F (37.4 C)     TempSrc: Axillary     SpO2: 97% 96% 96% 97%  Weight:      Height:        Intake/Output Summary (Last 24 hours) at 08/14/2019 1436 Last data filed at 08/14/2019 1400 Gross per 24 hour  Intake 1520.72 ml  Output 2625 ml  Net -1104.28 ml   Filed Weights   08/12/19 0500 08/13/19 0425 08/14/19 0500  Weight: 108 kg 104 kg 101.7 kg    Examination:  General exam: Chronically ill-appearing obese gentleman, appears older than the stated age.  In no acute distress. Respiratory system: Clear to auscultation. Respiratory effort normal. Cardiovascular system: S1 & S2 heard, RRR. No JVD, murmurs, rubs, gallops or clicks. Gastrointestinal system: Soft, nontender, nondistended, bowel sounds positive. Central nervous system: Alert, did not answer the orientation questions, following simple commands, no focal neurologic deficit.   Extremities: Bilateral upper extremity edema, no lower extremity edema, no cyanosis, pulses intact and symmetrical. Psychiatry: Judgement and insight appear impaired.  DVT prophylaxis: Lovenox Code Status: DNR Family Communication: No family at bedside. Disposition Plan: Pending improvement and SNF placement. Patient is critically ill with bad prognosis at this time.  High risk for deterioration.  Consultants:   PCCM  Cardiology  Neurology  Procedures:  Antimicrobials:   Data Reviewed: I have personally  reviewed following labs and imaging studies  CBC: Recent Labs  Lab 08/10/19 0406 08/11/19 0406 08/12/19 0307 08/13/19 0415 08/14/19 0500  WBC 10.4 10.3 8.3 9.3 7.7  NEUTROABS  --  7.6 6.0  --   --   HGB 11.7* 11.7* 11.8* 12.0* 12.0*  HCT 38.4*  37.9* 39.2 39.5 39.7  MCV 102.9* 103.0* 104.0* 103.7* 103.7*  PLT 254 249 262 283 818   Basic Metabolic Panel: Recent Labs  Lab 08/08/19 0927 08/09/19 0438 08/11/19 0406 08/11/19 0406 08/11/19 1419 08/11/19 1859 08/12/19 0307 08/12/19 0703 08/13/19 0415 08/13/19 0732 08/13/19 1117 08/13/19 1800 08/14/19 0500  NA 153*   < > 155*   < > 152*   < > 153*   < > 156* 158* 155* 155* 153*  K 3.6   < > 3.4*  --  4.7  --  3.8  --  3.8  --   --   --  3.2*  CL 115*   < > 118*  --  115*  --  116*  --  119*  --   --   --  118*  CO2 27   < > 30  --  26  --  31  --  28  --   --   --  30  GLUCOSE 140*   < > 125*  --  106*  --  92  --  110*  --   --   --  128*  BUN 57*   < > 46*  --  40*  --  37*  --  41*  --   --   --  41*  CREATININE 1.16   < > 0.87  --  0.91  --  0.79  --  1.02  --   --   --  0.96  CALCIUM 8.6*   < > 8.5*  --  8.6*  --  8.4*  --  8.5*  --   --   --  8.4*  MG 2.6*  --  2.6*  --   --   --   --   --  3.0*  --   --   --  2.8*  PHOS  --   --  2.9  --   --   --   --   --   --   --  3.5  --  3.7   < > = values in this interval not displayed.   GFR: Estimated Creatinine Clearance: 76.7 mL/min (by C-G formula based on SCr of 0.96 mg/dL). Liver Function Tests: Recent Labs  Lab 08/08/19 0927 08/14/19 0500  AST 87*  --   ALT 92*  --   ALKPHOS 55  --   BILITOT 0.8  --   PROT 6.6  --   ALBUMIN 2.9* 2.7*   No results for input(s): LIPASE, AMYLASE in the last 168 hours. Recent Labs  Lab 08/08/19 1234 08/14/19 1109  AMMONIA 64* 15   Coagulation Profile: Recent Labs  Lab 08/08/19 1234  INR 1.4*   Cardiac Enzymes: No results for input(s): CKTOTAL, CKMB, CKMBINDEX, TROPONINI in the last 168 hours. BNP (last 3 results) No results for input(s): PROBNP in the last 8760 hours. HbA1C: No results for input(s): HGBA1C in the last 72 hours. CBG: Recent Labs  Lab 08/14/19 0357 08/14/19 0605 08/14/19 0715 08/14/19 0803 08/14/19 1120  GLUCAP 116* 123* 118* 119* 156*    Lipid Profile: No results for input(s): CHOL, HDL, LDLCALC, TRIG, CHOLHDL,  LDLDIRECT in the last 72 hours. Thyroid Function Tests: No results for input(s): TSH, T4TOTAL, FREET4, T3FREE, THYROIDAB in the last 72 hours. Anemia Panel: No results for input(s): VITAMINB12, FOLATE, FERRITIN, TIBC, IRON, RETICCTPCT in the last 72 hours. Sepsis Labs: Recent Labs  Lab 08/08/19 0927 08/09/19 0438 08/10/19 0406  PROCALCITON 0.29 0.25 0.25    Recent Results (from the past 240 hour(s))  CULTURE, BLOOD (ROUTINE X 2) w Reflex to ID Panel     Status: None   Collection Time: 08/08/19  9:27 AM   Specimen: BLOOD  Result Value Ref Range Status   Specimen Description BLOOD RIGHT FOOT  Final   Special Requests   Final    BOTTLES DRAWN AEROBIC AND ANAEROBIC Blood Culture adequate volume   Culture   Final    NO GROWTH 5 DAYS Performed at [facility], [address] [place], [place] [address]    Report Status 08/13/2019 FINAL  Final  CULTURE, BLOOD (ROUTINE X 2) w Reflex to ID Panel     Status: None   Collection Time: 08/08/19  9:27 AM   Specimen: BLOOD  Result Value Ref Range Status   Specimen Description BLOOD LEFT FOOT  Final   Special Requests   Final    BOTTLES DRAWN AEROBIC AND ANAEROBIC Blood Culture results may not be optimal due to an excessive volume of blood received in culture bottles   Culture   Final    NO GROWTH 5 DAYS Performed at [facility], [address] [place], [place] [address]    Report Status 08/13/2019 FINAL  Final  MRSA PCR Screening     Status: None   Collection Time: 08/08/19  9:43 AM   Specimen: Nasopharyngeal  Result Value Ref Range Status   MRSA by PCR NEGATIVE NEGATIVE Final    Comment:        The GeneXpert MRSA Assay (FDA approved for NASAL specimens only), is one component of a comprehensive MRSA colonization surveillance program. It is not intended to diagnose MRSA infection nor to guide or monitor treatment for MRSA  infections. Performed at [facility], [address] [place], [place] [address]   Culture, respiratory (non-expectorated)     Status: None   Collection Time: 08/08/19  5:01 PM   Specimen: Tracheal Aspirate; Respiratory  Result Value Ref Range Status   Specimen Description   Final    TRACHEAL ASPIRATE Performed at [facility], [address], [place], [place] [address]    Special Requests   Final    NONE Performed at [facility], [address] [place], [place] [address]    Gram Stain   Final    RARE WBC PRESENT, PREDOMINANTLY PMN RARE GRAM POSITIVE COCCI IN PAIRS Performed at [facility], [place] [address] [place], [place] [address]    Culture RARE STAPHYLOCOCCUS AUREUS  Final   Report Status 08/12/2019 FINAL  Final   Organism ID, Bacteria STAPHYLOCOCCUS AUREUS  Final      Susceptibility   Staphylococcus aureus - MIC*    CIPROFLOXACIN <=0.5 SENSITIVE Sensitive     ERYTHROMYCIN <=0.25 SENSITIVE Sensitive     GENTAMICIN <=0.5 SENSITIVE Sensitive     OXACILLIN 0.5 SENSITIVE Sensitive     TETRACYCLINE <=1 SENSITIVE Sensitive     VANCOMYCIN 1 SENSITIVE Sensitive     TRIMETH/SULFA <=10 SENSITIVE Sensitive     CLINDAMYCIN <=0.25 SENSITIVE Sensitive     RIFAMPIN <=0.5 SENSITIVE Sensitive     Inducible  Clindamycin NEGATIVE Sensitive     * RARE STAPHYLOCOCCUS AUREUS  Culture, respiratory (non-expectorated)     Status: None   Collection Time: 08/10/19 10:04 AM   Specimen: Bronchoalveolar Lavage; Respiratory  Result Value Ref Range Status   Specimen Description   Final    BRONCHIAL ALVEOLAR LAVAGE Performed at [facility], [address], [place], [place] [address]    Special Requests   Final    NONE Performed at [facility], [address] [place], [place] [address]    Gram Stain NO WBC SEEN NO ORGANISMS SEEN   Final   Culture   Final    RARE Consistent with normal respiratory flora. Performed at [facility], [place] [address], [place], [place] [address]    Report Status 08/12/2019 FINAL  Final     Radiology Studies: No results found.  Scheduled Meds: . amLODipine  10 mg Oral Daily  . chlorhexidine gluconate (MEDLINE KIT)  15 mL Mouth Rinse BID  . Chlorhexidine Gluconate Cloth  6 each Topical Daily  . enoxaparin (LOVENOX) injection  40 mg Subcutaneous Q24H  . famotidine  20 mg Oral BID  . [START ON 1/91/6606] folic acid  1 mg Oral Daily  . furosemide  40 mg Intravenous Daily  . insulin aspart  0-15 Units Subcutaneous TID WC  . insulin aspart  0-5 Units Subcutaneous QHS  . ipratropium-albuterol  3 mL Nebulization Q6H  . mouth rinse  15 mL Mouth Rinse 10 times per day  . nystatin  5 mL Oral QID  . [START ON 08/15/2019] thiamine  100 mg Oral Daily   Continuous Infusions: . sodium chloride Stopped (08/09/19 0845)  . dextrose 5 % with kcl 50 mL/hr at 08/14/19 1115     LOS: 17 days   Time spent: 35 minutes.  Lorella Nimrod, MD [facility] Pager [phone]  If 7PM-7AM, please contact night-coverage [email or web address] Password [facility] 08/14/2019, 2:36 PM   This record has been created using Dragon voice recognition software. Errors have been sought and corrected,but may not always be located. Such creation errors do not reflect on the standard of care.

## 2019-08-15 LAB — CBC
HCT: 37.9 % — ABNORMAL LOW (ref 39.0–52.0)
Hemoglobin: 11.9 g/dL — ABNORMAL LOW (ref 13.0–17.0)
MCH: 31.8 pg (ref 26.0–34.0)
MCHC: 31.4 g/dL (ref 30.0–36.0)
MCV: 101.3 fL — ABNORMAL HIGH (ref 80.0–100.0)
Platelets: 263 10*3/uL (ref 150–400)
RBC: 3.74 MIL/uL — ABNORMAL LOW (ref 4.22–5.81)
RDW: 13.8 % (ref 11.5–15.5)
WBC: 7 10*3/uL (ref 4.0–10.5)
nRBC: 0 % (ref 0.0–0.2)

## 2019-08-15 LAB — MAGNESIUM: Magnesium: 2.4 mg/dL (ref 1.7–2.4)

## 2019-08-15 LAB — BASIC METABOLIC PANEL
Anion gap: 8 (ref 5–15)
BUN: 33 mg/dL — ABNORMAL HIGH (ref 8–23)
CO2: 28 mmol/L (ref 22–32)
Calcium: 8.4 mg/dL — ABNORMAL LOW (ref 8.9–10.3)
Chloride: 119 mmol/L — ABNORMAL HIGH (ref 98–111)
Creatinine, Ser: 0.93 mg/dL (ref 0.61–1.24)
GFR calc Af Amer: >60 mL/min (ref >60)
GFR calc non Af Amer: >60 mL/min (ref >60)
Glucose, Bld: 117 mg/dL — ABNORMAL HIGH (ref 70–99)
Potassium: 3.3 mmol/L — ABNORMAL LOW (ref 3.5–5.1)
Sodium: 155 mmol/L — ABNORMAL HIGH (ref 135–145)

## 2019-08-15 LAB — GLUCOSE, CAPILLARY
Glucose-Capillary: 112 mg/dL — ABNORMAL HIGH (ref 70–99)
Glucose-Capillary: 123 mg/dL — ABNORMAL HIGH (ref 70–99)
Glucose-Capillary: 100 mg/dL — ABNORMAL HIGH (ref 70–99)
Glucose-Capillary: 119 mg/dL — ABNORMAL HIGH (ref 70–99)
Glucose-Capillary: 110 mg/dL — ABNORMAL HIGH (ref 70–99)

## 2019-08-15 LAB — OSMOLALITY: Osmolality: 332 mOsm/kg (ref 275–295)

## 2019-08-15 MED ORDER — POTASSIUM CHLORIDE CRYS ER 20 MEQ PO TBCR
40.0000 meq | EXTENDED_RELEASE_TABLET | Freq: Once | ORAL | Status: DC
  Filled 2019-08-15 (×3): qty 2

## 2019-08-15 NOTE — Consult Note (Signed)
PHARMACY CONSULT NOTE - FOLLOW UP  Pharmacy Consult for Electrolyte Monitoring and Replacement   Antonio [name] is a morbidly obese 74 y.o. male admitted on 07/27/2019.  Pharmacy consulted to assist with monitoring and replacing electrolytes.    Recent Labs: Potassium (mmol/L)  Date Value  08/15/2019 3.3 (L)   Magnesium (mg/dL)  Date Value  08/15/2019 2.4   Calcium (mg/dL)  Date Value  08/15/2019 8.4 (L)   Albumin (g/dL)  Date Value  08/14/2019 2.7 (L)   Phosphorus (mg/dL)  Date Value  08/14/2019 3.7   Sodium (mmol/L)  Date Value  08/15/2019 155 (H)   Corrected Ca: 9.4 mg/dL  Assessment/Plan: 1. Electrolytes (goal: electrolytes wnl):   Sodium levels still in 150s: on D5W with potassium 40 mEq/L at 100 mL/hr   Potassium has slightly improved since yesterday on D540  Still receiving furosemide 40mg  IV daily  Replace potassium with an additional 40 mEq oral KCl  F/U electrolytes in am  2. Glucose:   Last 24h range: 109-156  On insulin aspart 0-15 units [place] three times a day with meals and insulin aspart 0-5 units [place] daily at bedtime.  Total SSI from 1/26: 5 units   Continue monitoring pt's eating/drinking habits, continue insulin administrations, and monitor glucose daily.   3. Constipation:   Last BM: 1/26  Not on any meds for constipation  Continue to monitor and add constipation-relief meds if constipation occurs.    Dallie Piles ,PharmD 08/15/2019 9:29 AM

## 2019-08-15 NOTE — NC FL2 (Signed)
[place] LEVEL OF CARE SCREENING TOOL     IDENTIFICATION  Patient Name: Antonio [name] Birthdate: 1945-10-25 Sex: male Admission Date (Current Location): 07/27/2019  [place] and [place] Number:  Engineering geologist and Address:  [facility], [address], [place], [place] [address]      Provider Number: [id number]  Attending Physician Name and Address:  Lorella Nimrod, MD  Relative Name and Phone Number:       Current Level of Care: Hospital Recommended Level of Care: [place] Prior Approval Number:    Date Approved/Denied:   PASRR Number: [id number]  Discharge Plan: SNF    Current Diagnoses: Patient Active Problem List   Diagnosis Date Noted  . Pulmonary infiltrates   . Pulmonary disease   . Hypernatremia   . Respiratory failure ([place]) 07/28/2019    Orientation RESPIRATION BLADDER Height & Weight     Self, Time  Normal Incontinent Weight: 101.7 kg Height:  5' 7.01" (170.2 cm)  BEHAVIORAL SYMPTOMS/MOOD NEUROLOGICAL BOWEL NUTRITION STATUS      Incontinent Diet(Dysphagia 1)  AMBULATORY STATUS COMMUNICATION OF NEEDS Skin   Extensive Assist Verbally Normal                       Personal Care Assistance Level of Assistance  Total care       Total Care Assistance: Maximum assistance   Functional Limitations Info             SPECIAL CARE FACTORS FREQUENCY  PT (By licensed PT), OT (By licensed OT)                    Contractures Contractures Info: Not present    Additional Factors Info  Code Status Code Status Info: DNR             Current Medications (08/15/2019):  This is the current hospital active medication list Current Facility-Administered Medications  Medication Dose Route Frequency Provider Last Rate Last Admin  . 0.9 %  sodium chloride infusion  250 mL Intravenous PRN Flora Lipps, MD   Stopped at 08/09/19 0845  . acetaminophen (TYLENOL) tablet 650 mg  650 mg Oral  Q4H PRN Flora Lipps, MD   650 mg at 08/08/19 2044  . amLODipine (NORVASC) tablet 10 mg  10 mg Oral Daily Lorella Nimrod, MD   10 mg at 08/14/19 1110  . chlorhexidine gluconate (MEDLINE KIT) (PERIDEX) 0.12 % solution 15 mL  15 mL Mouth Rinse BID Flora Lipps, MD   15 mL at 08/15/19 0850  . Chlorhexidine Gluconate Cloth 2 % PADS 6 each  6 each Topical Daily Flora Lipps, MD   6 each at 08/14/19 2359  . dextrose 5 % 1,000 mL with potassium chloride 40 mEq infusion   Intravenous Continuous Lorella Nimrod, MD 100 mL/hr at 08/15/19 0849 Rate Change at 08/15/19 0849  . enoxaparin (LOVENOX) injection 40 mg  40 mg Subcutaneous Q24H Hallaji, Sheema M, RPH   40 mg at 08/15/19 0914  . famotidine (PEPCID) tablet 20 mg  20 mg Oral BID Lorella Nimrod, MD   20 mg at 08/14/19 2032  . folic acid (FOLVITE) tablet 1 mg  1 mg Oral Daily Lorella Nimrod, MD      . furosemide (LASIX) injection 40 mg  40 mg Intravenous Daily Ottie Glazier, MD   40 mg at 08/14/19 0902  . hydrALAZINE (APRESOLINE) injection 20 mg  20 mg Intravenous Q4H PRN Tyler Pita,  MD   20 mg at 08/08/19 0304  . insulin aspart (novoLOG) injection 0-15 Units  0-15 Units Subcutaneous TID WC Lorella Nimrod, MD   2 Units at 08/14/19 1656  . insulin aspart (novoLOG) injection 0-5 Units  0-5 Units Subcutaneous QHS Amin, Soundra Pilon, MD      . ipratropium-albuterol (DUONEB) 0.5-2.5 (3) MG/3ML nebulizer solution 3 mL  3 mL Nebulization Q6H Ottie Glazier, MD   3 mL at 08/15/19 0738  . MEDLINE mouth rinse  15 mL Mouth Rinse 10 times per day Flora Lipps, MD   15 mL at 08/15/19 0913  . metoprolol tartrate (LOPRESSOR) injection 2.5-5 mg  2.5-5 mg Intravenous Q4H PRN Tyler Pita, MD   5 mg at 08/08/19 0436  . nystatin (MYCOSTATIN) 100000 UNIT/ML suspension 500,000 Units  5 mL Oral QID Ottie Glazier, MD   500,000 Units at 08/14/19 2032  . ondansetron (ZOFRAN) injection 4 mg  4 mg Intravenous Q6H PRN Flora Lipps, MD      . potassium chloride SA (KLOR-CON) CR  tablet 40 mEq  40 mEq Oral Once Dallie Piles, RPH      . sodium chloride flush (NS) 0.9 % injection 10-40 mL  10-40 mL Intracatheter PRN Flora Lipps, MD   10 mL at 08/03/19 0929  . thiamine tablet 100 mg  100 mg Oral Daily Lorella Nimrod, MD         Discharge Medications: Please see discharge summary for a list of discharge medications.  Relevant Imaging Results:  Relevant Lab Results:   Additional Information    Shelbie Ammons, RN

## 2019-08-15 NOTE — Care Management Important Message (Signed)
Important Message  Patient Details  Name: Antonio [name] MRN: [id number] Date of Birth: 1945/09/10   Medicare Important Message Given:  Yes     [name] 08/15/2019, 10:49 AM

## 2019-08-15 NOTE — TOC Progression Note (Signed)
Transition of Care [facility]) - Progression Note    Patient Details  Name: Antonio [name] MRN: [id number] Date of Birth: August 09, 1945  Transition of Care [facility]) CM/SW Contact  Shelbie Ammons, RN Phone Number: 08/15/2019, 11:23 AM  Clinical Narrative:   RNCM placed call to patient's son to discuss current recommendations for patient to go to SNF, son reports that his father has never been in a rehab or even seen an MD that he is aware of. Discussed patient's current improvement but that he still is not able to speak very much or verbalize needs. Son did give verbal ok for this CM to initiate bed search. PASSR number obtained and FL2 completed. Bed search initiated.          Expected Discharge Plan and Services                                                 Social Determinants of Health (SDOH) Interventions    Readmission Risk Interventions No flowsheet data found.

## 2019-08-15 NOTE — Progress Notes (Signed)
CRITICAL VALUE ALERT  Critical Value:  Blood osmolality 332  Date & Time Notied:  08/15/19 1447  Provider Notified: Yes  Orders Received/Actions taken: Per send urine for osmolality. In/Out cath in patient has not voided.   Fuller Mandril, RN

## 2019-08-15 NOTE — Progress Notes (Signed)
PROGRESS NOTE    Antonio [name]  [id number] DOB: [birth year] DOA: 07/27/2019 PCP: Patient, No Pcp Per   Brief Narrative:  Antonio [name] is a 74 yo male with a history of alcohol abuse, tobacco abuse and suspected COPD who presented to the ED on 07/27/2019 after being found down by EMS. He was admitted to the ICU in acute respiratory distress, intubated and sedated. On further workup, he was found to have a grade I diastolic dysfunction, moderate aortic stenosis and AKI.  Extubated on 08/11/2019.  Did develop resistant hypernatremia which did not respond to free water initially, started on desmopressin.  Subjective: Patient appears very lethargic and not responding to any commands.  Just open eyes.  Remains nonverbal.  Is also refusing diet today.  Assessment & Plan:   Active Problems:   Respiratory failure (HCC)   Hypernatremia   Pulmonary infiltrates   Pulmonary disease  Acute hypoxic and hypercapnic respiratory failure.  Resolved.  Now saturating well on room air.  Initially treated for COPD exacerbation.  He was initially treated with multiple antibiotics in ICU which include Zosyn, vancomycin, ceftriaxone and Unasyn.  Now off antibiotics.  No UDS in the system.  Saturating well on RA.  No wheezing. -Continue DuoNeb treatment. -PT are recommending SNF placement, social worker are working on it.  Hypernatremia.  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  Did not responded well to desmopressin.  Could not find labs in his chart.  ACTH stim test done on 08/08/2019 with baseline cortisol of 19 and appropriate response to ACTH.  Serum osmolality today was 332.  Free water deficit of 5.2 L. Has not voided yet for urine osmolality, sodium, potassium and creatinine levels-ordered in and out catheter to get urinary sample. -Increase rate of D5 100 mL/h. -Continue to monitor.  Hypokalemia.  Magnesium within normal limit. -Replete electrolyte and  monitor.  History of alcohol abuse.  Out of withdrawal now. -We will need counseling against alcohol once mental status improves. -Ativan as needed for agitation.  Encephalopathy.  Most likely secondary to serious illness and hypernatremia. Imaging and EEG was negative for any acute changes. -Continue to monitor. -Supportive care.  Acute diastolic heart failure.  Echo with grade 1 diastolic heart failure. -Continue Lasix 40 mg daily. -Monitor BMP. - intake and output. -Daily weight, approximately more than 25 pound loss since admission.  Hypertension.  Blood pressure within goal. -Continue amlodipine and metoprolol.  Objective: Vitals:   08/15/19 0553 08/15/19 0739 08/15/19 1300 08/15/19 1443  BP: (!) 132/59  (!) 125/50   Pulse: 80  62   Resp:   (!) 24   Temp:   98.2 F (36.8 C)   TempSrc:   Axillary   SpO2: 100% 95% 100% 100%  Weight:      Height:        Intake/Output Summary (Last 24 hours) at 08/15/2019 1458 Last data filed at 08/15/2019 1300 Gross per 24 hour  Intake 1443.29 ml  Output 0 ml  Net 1443.29 ml   Filed Weights   08/12/19 0500 08/13/19 0425 08/14/19 0500  Weight: 108 kg 104 kg 101.7 kg    Examination:  General exam: Chronically ill-appearing obese gentleman, appears older than the stated age.  In no acute distress. Respiratory system: Clear to auscultation. Respiratory effort normal. Cardiovascular system: S1 & S2 heard, RRR. No JVD, murmurs, rubs, gallops or clicks. Gastrointestinal system: Soft, nontender, nondistended, bowel sounds positive. Central nervous system: Lethargic and not following any commands. Extremities: Bilateral upper  extremity edema, no lower extremity edema, no cyanosis, pulses intact and symmetrical. Psychiatry: Judgement and insight appear impaired.  DVT prophylaxis: Lovenox Code Status: DNR Family Communication: No family at bedside. Disposition Plan: Pending improvement and SNF placement. Patient is critically ill with  bad prognosis at this time.  High risk for deterioration.  Consultants:   PCCM  Cardiology  Neurology  Procedures:  Antimicrobials:   Data Reviewed: I have personally reviewed following labs and imaging studies  CBC: Recent Labs  Lab 08/11/19 0406 08/12/19 0307 08/13/19 0415 08/14/19 0500 08/15/19 0613  WBC 10.3 8.3 9.3 7.7 7.0  NEUTROABS 7.6 6.0  --   --   --   HGB 11.7* 11.8* 12.0* 12.0* 11.9*  HCT 37.9* 39.2 39.5 39.7 37.9*  MCV 103.0* 104.0* 103.7* 103.7* 101.3*  PLT 249 262 283 281 701   Basic Metabolic Panel: Recent Labs  Lab 08/11/19 0406 08/11/19 0406 08/11/19 1419 08/11/19 1859 08/12/19 0307 08/12/19 0703 08/13/19 0415 08/13/19 0415 08/13/19 0732 08/13/19 1117 08/13/19 1800 08/14/19 0500 08/15/19 0613  NA 155*   < > 152*   < > 153*   < > 156*   < > 158* 155* 155* 153* 155*  K 3.4*   < > 4.7  --  3.8  --  3.8  --   --   --   --  3.2* 3.3*  CL 118*   < > 115*  --  116*  --  119*  --   --   --   --  118* 119*  CO2 30   < > 26  --  31  --  28  --   --   --   --  30 28  GLUCOSE 125*   < > 106*  --  92  --  110*  --   --   --   --  128* 117*  BUN 46*   < > 40*  --  37*  --  41*  --   --   --   --  41* 33*  CREATININE 0.87   < > 0.91  --  0.79  --  1.02  --   --   --   --  0.96 0.93  CALCIUM 8.5*   < > 8.6*  --  8.4*  --  8.5*  --   --   --   --  8.4* 8.4*  MG 2.6*  --   --   --   --   --  3.0*  --   --   --   --  2.8* 2.4  PHOS 2.9  --   --   --   --   --   --   --   --  3.5  --  3.7  --    < > = values in this interval not displayed.   GFR: Estimated Creatinine Clearance: 79.1 mL/min (by C-G formula based on SCr of 0.93 mg/dL). Liver Function Tests: Recent Labs  Lab 08/14/19 0500  ALBUMIN 2.7*   No results for input(s): LIPASE, AMYLASE in the last 168 hours. Recent Labs  Lab 08/14/19 1109  AMMONIA 15   Coagulation Profile: No results for input(s): INR, PROTIME in the last 168 hours. Cardiac Enzymes: No results for input(s): CKTOTAL, CKMB,  CKMBINDEX, TROPONINI in the last 168 hours. BNP (last 3 results) No results for input(s): PROBNP in the last 8760 hours. HbA1C: No results for input(s): HGBA1C in the last  72 hours. CBG: Recent Labs  Lab 08/14/19 1120 08/14/19 1643 08/14/19 2150 08/15/19 0750 08/15/19 1156  GLUCAP 156* 133* 109* 112* 123*   Lipid Profile: No results for input(s): CHOL, HDL, LDLCALC, TRIG, CHOLHDL, LDLDIRECT in the last 72 hours. Thyroid Function Tests: No results for input(s): TSH, T4TOTAL, FREET4, T3FREE, THYROIDAB in the last 72 hours. Anemia Panel: No results for input(s): VITAMINB12, FOLATE, FERRITIN, TIBC, IRON, RETICCTPCT in the last 72 hours. Sepsis Labs: Recent Labs  Lab 08/09/19 0438 08/10/19 0406  PROCALCITON 0.25 0.25    Recent Results (from the past 240 hour(s))  CULTURE, BLOOD (ROUTINE X 2) w Reflex to ID Panel     Status: None   Collection Time: 08/08/19  9:27 AM   Specimen: BLOOD  Result Value Ref Range Status   Specimen Description BLOOD RIGHT FOOT  Final   Special Requests   Final    BOTTLES DRAWN AEROBIC AND ANAEROBIC Blood Culture adequate volume   Culture   Final    NO GROWTH 5 DAYS Performed at [facility], [address] [place], [place] [address]    Report Status 08/13/2019 FINAL  Final  CULTURE, BLOOD (ROUTINE X 2) w Reflex to ID Panel     Status: None   Collection Time: 08/08/19  9:27 AM   Specimen: BLOOD  Result Value Ref Range Status   Specimen Description BLOOD LEFT FOOT  Final   Special Requests   Final    BOTTLES DRAWN AEROBIC AND ANAEROBIC Blood Culture results may not be optimal due to an excessive volume of blood received in culture bottles   Culture   Final    NO GROWTH 5 DAYS Performed at [facility], [address] [place], [place] [address]    Report Status 08/13/2019 FINAL  Final  MRSA PCR Screening     Status: None   Collection Time: 08/08/19  9:43 AM   Specimen: Nasopharyngeal  Result Value Ref Range Status    MRSA by PCR NEGATIVE NEGATIVE Final    Comment:        The GeneXpert MRSA Assay (FDA approved for NASAL specimens only), is one component of a comprehensive MRSA colonization surveillance program. It is not intended to diagnose MRSA infection nor to guide or monitor treatment for MRSA infections. Performed at [facility], [address] [place], [place] [address]   Culture, respiratory (non-expectorated)     Status: None   Collection Time: 08/08/19  5:01 PM   Specimen: Tracheal Aspirate; Respiratory  Result Value Ref Range Status   Specimen Description   Final    TRACHEAL ASPIRATE Performed at [facility], [address], [place], [place] [address]    Special Requests   Final    NONE Performed at [facility], [address] [place], [place] [address]    Gram Stain   Final    RARE WBC PRESENT, PREDOMINANTLY PMN RARE GRAM POSITIVE COCCI IN PAIRS Performed at [facility], [place] [address], [place], [place] [address]    Culture RARE STAPHYLOCOCCUS AUREUS  Final   Report Status 08/12/2019 FINAL  Final   Organism ID, Bacteria STAPHYLOCOCCUS AUREUS  Final      Susceptibility   Staphylococcus aureus - MIC*    CIPROFLOXACIN <=0.5 SENSITIVE Sensitive     ERYTHROMYCIN <=0.25 SENSITIVE Sensitive     GENTAMICIN <=0.5 SENSITIVE Sensitive     OXACILLIN 0.5 SENSITIVE Sensitive     TETRACYCLINE <=1 SENSITIVE Sensitive  VANCOMYCIN 1 SENSITIVE Sensitive     TRIMETH/SULFA <=10 SENSITIVE Sensitive     CLINDAMYCIN <=0.25 SENSITIVE Sensitive     RIFAMPIN <=0.5 SENSITIVE Sensitive     Inducible Clindamycin NEGATIVE Sensitive     * RARE STAPHYLOCOCCUS AUREUS  Culture, respiratory (non-expectorated)     Status: None   Collection Time: 08/10/19 10:04 AM   Specimen: Bronchoalveolar Lavage; Respiratory  Result Value Ref Range Status   Specimen Description   Final    BRONCHIAL ALVEOLAR LAVAGE Performed at [facility], [address]  [place], [place] [address]    Special Requests   Final    NONE Performed at [facility], [address] [place], [place] [address]    Gram Stain NO WBC SEEN NO ORGANISMS SEEN   Final   Culture   Final    RARE Consistent with normal respiratory flora. Performed at [facility], [place] [address], [place], [place] [address]    Report Status 08/12/2019 FINAL  Final     Radiology Studies: No results found.  Scheduled Meds: . amLODipine  10 mg Oral Daily  . chlorhexidine gluconate (MEDLINE KIT)  15 mL Mouth Rinse BID  . Chlorhexidine Gluconate Cloth  6 each Topical Daily  . enoxaparin (LOVENOX) injection  40 mg Subcutaneous Q24H  . famotidine  20 mg Oral BID  . folic acid  1 mg Oral Daily  . furosemide  40 mg Intravenous Daily  . insulin aspart  0-15 Units Subcutaneous TID WC  . insulin aspart  0-5 Units Subcutaneous QHS  . ipratropium-albuterol  3 mL Nebulization Q6H  . nystatin  5 mL Oral QID  . potassium chloride  40 mEq Oral Once  . thiamine  100 mg Oral Daily   Continuous Infusions: . dextrose 5 % with kcl 100 mL/hr at 08/15/19 1300     LOS: 18 days   Time spent: 35 minutes.  Lorella Nimrod, MD [facility] Pager [phone]  If 7PM-7AM, please contact night-coverage [email or web address] Password [facility] 08/15/2019, 2:58 PM   This record has been created using Dragon voice recognition software. Errors have been sought and corrected,but may not always be located. Such creation errors do not reflect on the standard of care.

## 2019-08-16 ENCOUNTER — Inpatient Hospital Stay: Payer: Medicare Other

## 2019-08-16 LAB — CBC
HCT: 34.6 % — ABNORMAL LOW (ref 39.0–52.0)
Hemoglobin: 10.9 g/dL — ABNORMAL LOW (ref 13.0–17.0)
MCH: 31.5 pg (ref 26.0–34.0)
MCHC: 31.5 g/dL (ref 30.0–36.0)
MCV: 100 fL (ref 80.0–100.0)
Platelets: 237 10*3/uL (ref 150–400)
RBC: 3.46 MIL/uL — ABNORMAL LOW (ref 4.22–5.81)
RDW: 13.6 % (ref 11.5–15.5)
WBC: 4.9 10*3/uL (ref 4.0–10.5)
nRBC: 0 % (ref 0.0–0.2)

## 2019-08-16 LAB — BASIC METABOLIC PANEL
Anion gap: 7 (ref 5–15)
BUN: 21 mg/dL (ref 8–23)
CO2: 25 mmol/L (ref 22–32)
Calcium: 8.2 mg/dL — ABNORMAL LOW (ref 8.9–10.3)
Chloride: 117 mmol/L — ABNORMAL HIGH (ref 98–111)
Creatinine, Ser: 0.88 mg/dL (ref 0.61–1.24)
GFR calc Af Amer: >60 mL/min (ref >60)
GFR calc non Af Amer: >60 mL/min (ref >60)
Glucose, Bld: 115 mg/dL — ABNORMAL HIGH (ref 70–99)
Potassium: 3.5 mmol/L (ref 3.5–5.1)
Sodium: 149 mmol/L — ABNORMAL HIGH (ref 135–145)

## 2019-08-16 LAB — COMPREHENSIVE METABOLIC PANEL
ALT: 94 U/L — ABNORMAL HIGH (ref 0–44)
AST: 57 U/L — ABNORMAL HIGH (ref 15–41)
Albumin: 2.5 g/dL — ABNORMAL LOW (ref 3.5–5.0)
Alkaline Phosphatase: 51 U/L (ref 38–126)
Anion gap: 8 (ref 5–15)
BUN: 17 mg/dL (ref 8–23)
CO2: 23 mmol/L (ref 22–32)
Calcium: 8.1 mg/dL — ABNORMAL LOW (ref 8.9–10.3)
Chloride: 116 mmol/L — ABNORMAL HIGH (ref 98–111)
Creatinine, Ser: 0.86 mg/dL (ref 0.61–1.24)
GFR calc Af Amer: >60 mL/min (ref >60)
GFR calc non Af Amer: >60 mL/min (ref >60)
Glucose, Bld: 109 mg/dL — ABNORMAL HIGH (ref 70–99)
Potassium: 3.7 mmol/L (ref 3.5–5.1)
Sodium: 147 mmol/L — ABNORMAL HIGH (ref 135–145)
Total Bilirubin: 0.7 mg/dL (ref 0.3–1.2)
Total Protein: 5.7 g/dL — ABNORMAL LOW (ref 6.5–8.1)

## 2019-08-16 LAB — GLUCOSE, CAPILLARY
Glucose-Capillary: 109 mg/dL — ABNORMAL HIGH (ref 70–99)
Glucose-Capillary: 104 mg/dL — ABNORMAL HIGH (ref 70–99)
Glucose-Capillary: 88 mg/dL (ref 70–99)
Glucose-Capillary: 104 mg/dL — ABNORMAL HIGH (ref 70–99)
Glucose-Capillary: 102 mg/dL — ABNORMAL HIGH (ref 70–99)

## 2019-08-16 LAB — BLOOD GAS, VENOUS
Acid-Base Excess: 1 mmol/L (ref 0.0–2.0)
Bicarbonate: 25.2 mmol/L (ref 20.0–28.0)
FIO2: 0.21
O2 Saturation: 76.7 %
Patient temperature: 37
pCO2, Ven: 38 mmHg — ABNORMAL LOW (ref 44.0–60.0)
pH, Ven: 7.43 (ref 7.250–7.430)
pO2, Ven: 40 mmHg (ref 32.0–45.0)

## 2019-08-16 LAB — AMMONIA: Ammonia: 15 umol/L (ref 9–35)

## 2019-08-16 LAB — BETA-HYDROXYBUTYRIC ACID: Beta-Hydroxybutyric Acid: 0.14 mmol/L (ref 0.05–0.27)

## 2019-08-16 LAB — NA AND K (SODIUM & POTASSIUM), RAND UR
Potassium Urine: 37 mmol/L
Sodium, Ur: 123 mmol/L

## 2019-08-16 LAB — LACTIC ACID, PLASMA: Lactic Acid, Venous: 1 mmol/L (ref 0.5–1.9)

## 2019-08-16 LAB — MAGNESIUM: Magnesium: 2.3 mg/dL (ref 1.7–2.4)

## 2019-08-16 LAB — OSMOLALITY, URINE: Osmolality, Ur: 861 mOsm/kg (ref 300–900)

## 2019-08-16 LAB — CREATININE, URINE, RANDOM: Creatinine, Urine: 179 mg/dL

## 2019-08-16 LAB — TROPONIN I (HIGH SENSITIVITY): Troponin I (High Sensitivity): 86 ng/L — ABNORMAL HIGH (ref <18)

## 2019-08-16 IMAGING — CT CT HEAD W/O CM
3 series · 15 of 47 positions shown, 18 images · non-contrast
Comparison: MRI [DATE]

CLINICAL DATA: Altered mental status, unclear cause

EXAM:
CT HEAD WITHOUT CONTRAST
TECHNIQUE: Contiguous axial images were obtained from the base of the skull
through the vertex without intravenous contrast.

[Series 2: head wo · axial · 0.43mm/px · z∈[+769,+899]mm · 9 of 32 slices shown, 12 images]
[im 3/32  brain]
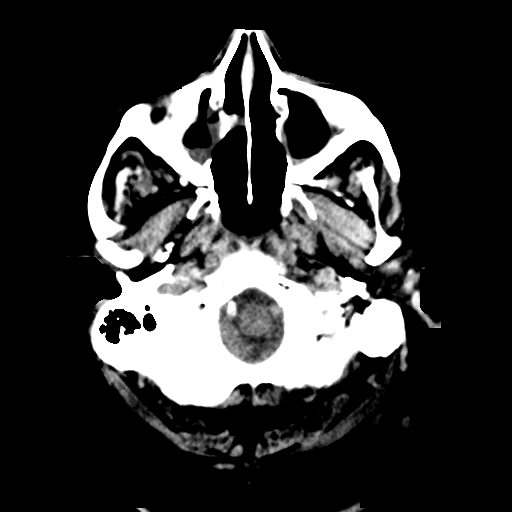
[im 3/32  bone]
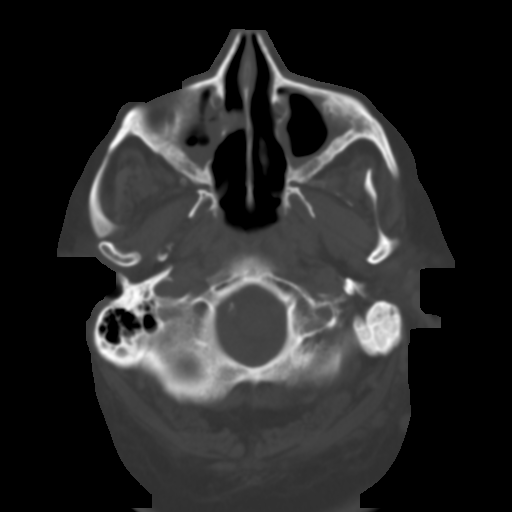
[im 6/32  brain]
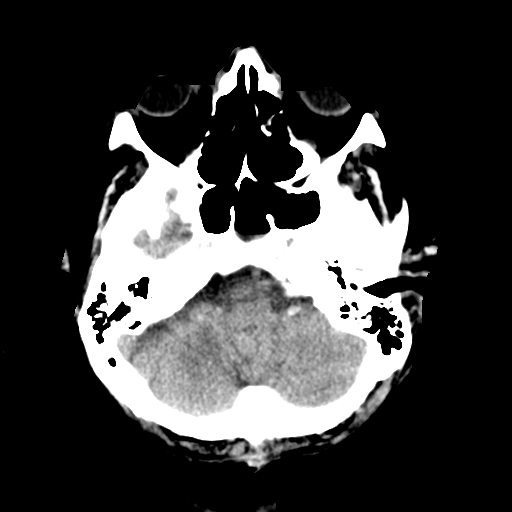
[im 9/32  brain]
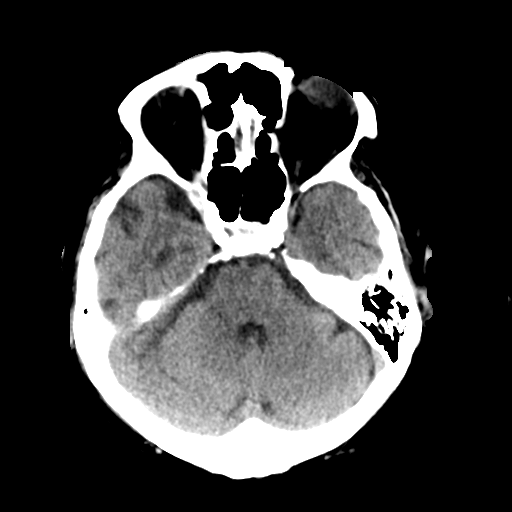
[im 12/32  brain]
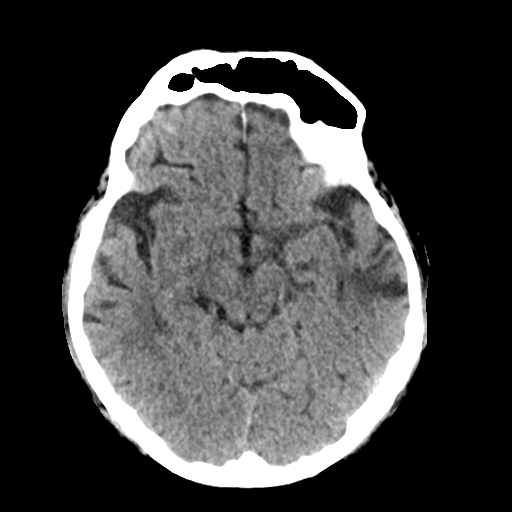
[im 17/32  brain]
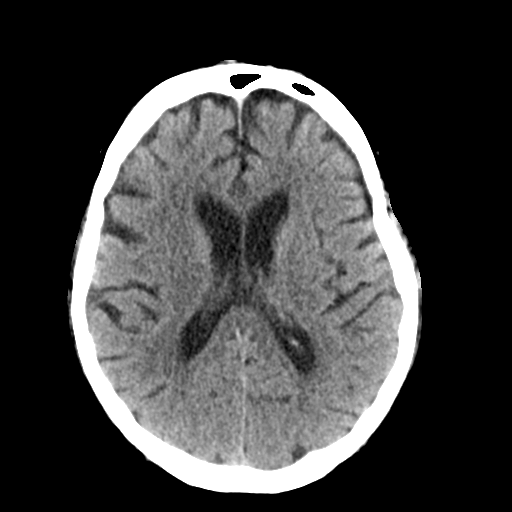
[im 17/32  bone]
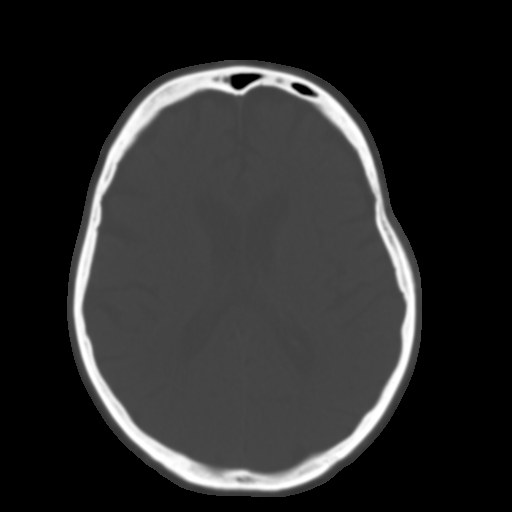
[im 20/32  brain]
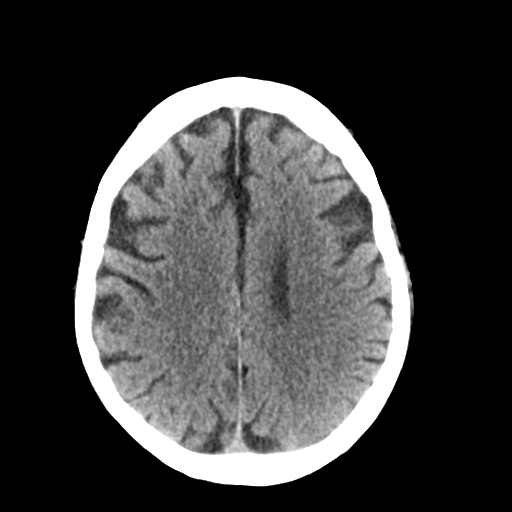
[im 23/32  brain]
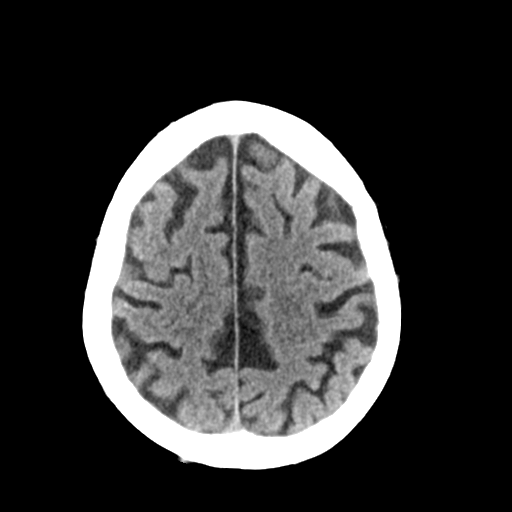
[im 26/32  brain]
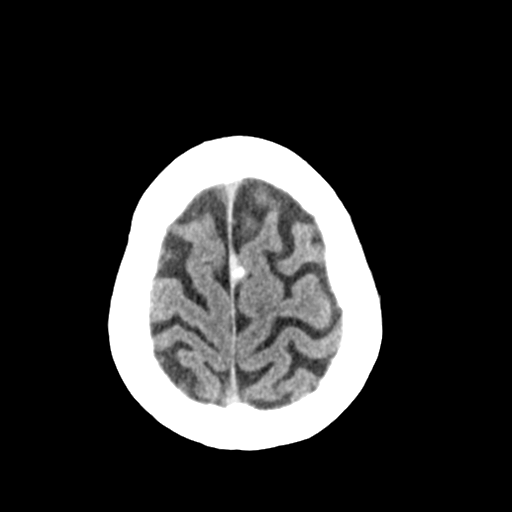
[im 29/32  brain]
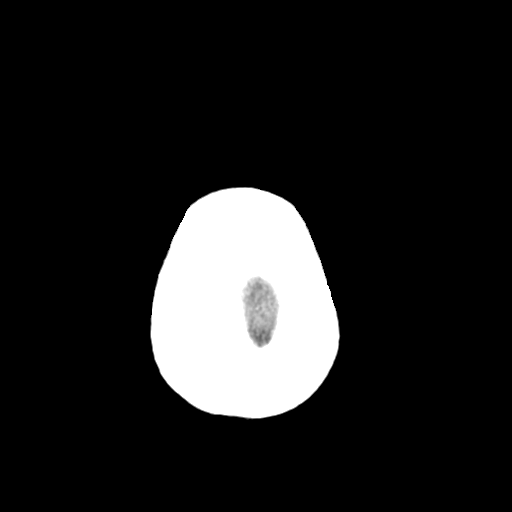
[im 29/32  bone]
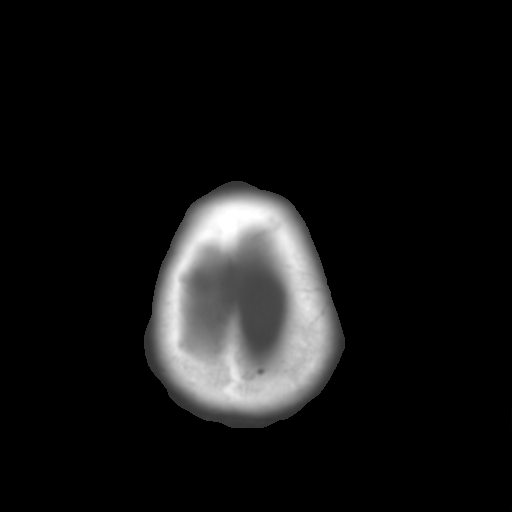

[Series 4: coronal soft tissue · coronal · 0.32mm/px · 3 of 63 slices shown]
[im 21/63  brain]
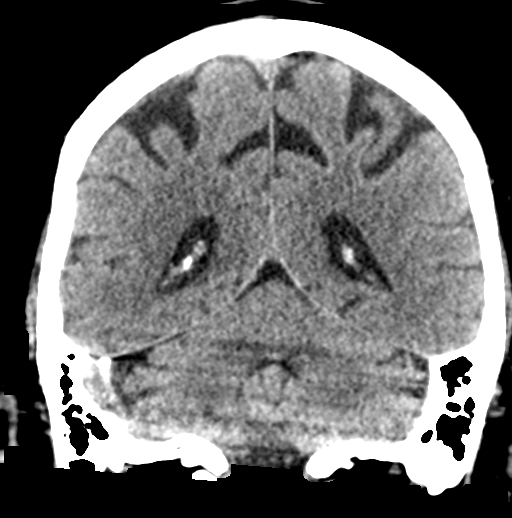
[im 28/63  brain]
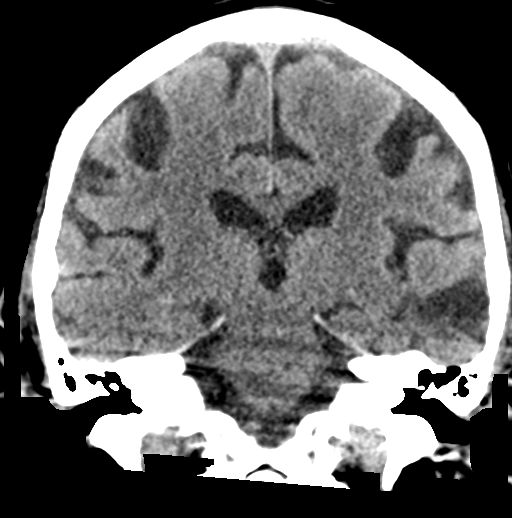
[im 35/63  brain]
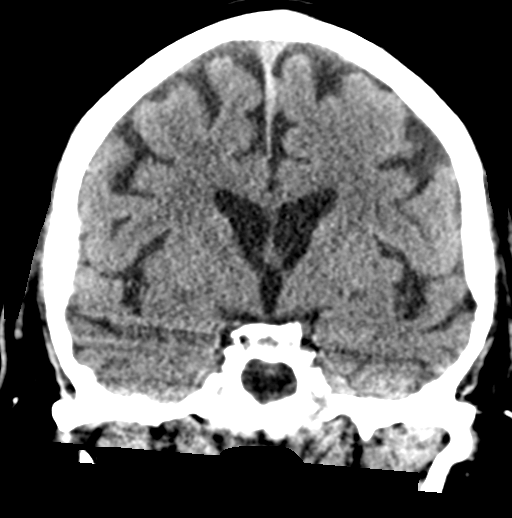

[Series 5: sagittal soft tissue · sagittal · 0.30mm/px · 3 of 55 slices shown]
[im 19/55  brain]
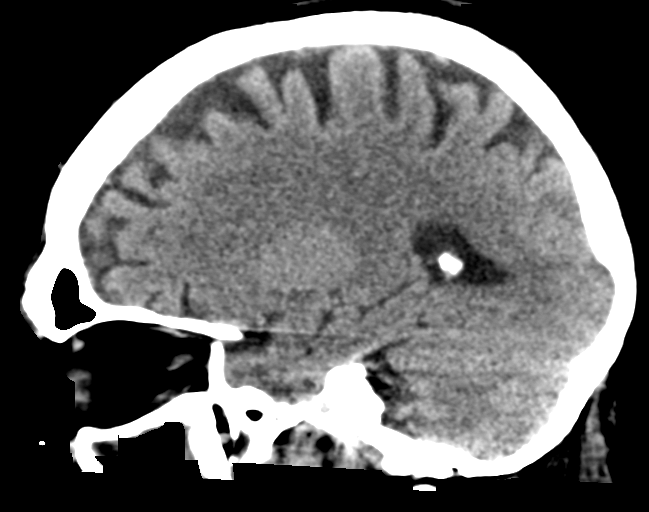
[im 28/55  brain]
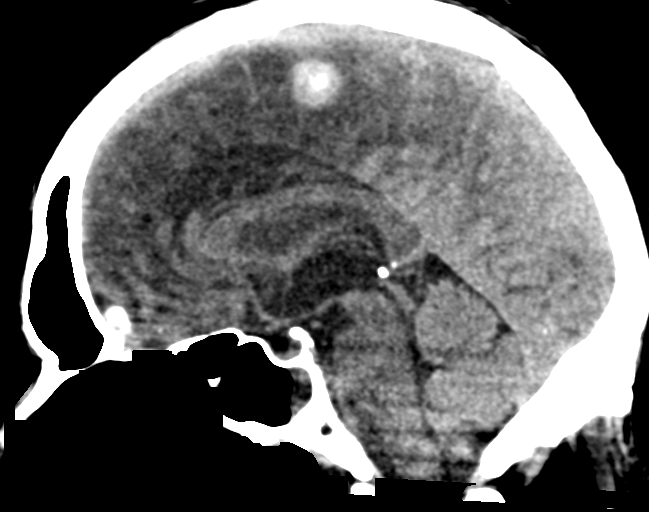
[im 37/55  brain]
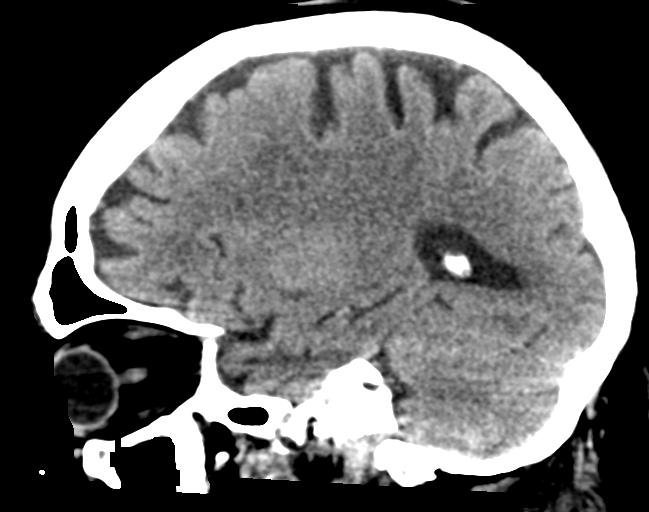

[15 of 47 positions shown; findings below may reference images not displayed]

FINDINGS: Brain: Hyperdense left parafalcine lesion with dural tails most
compatible meningioma as classified comparison MRI measuring 12 x
0.5 cm in size ([DATE]). No significant locoregional mass effect.
There is remote gliosis in the left temporal region which may
reflect sequela of prior infarct. No evidence of acute infarction,
hemorrhage, hydrocephalus, extra-axial collection or mass
lesion/mass effect. Symmetric prominence of the ventricles, cisterns
and sulci compatible with parenchymal volume loss. Patchy areas of
white matter hypoattenuation are most compatible with chronic
microvascular angiopathy.

Vascular: Atherosclerotic calcification of the carotid siphons and
intradural vertebral arteries. No hyperdense vessel.

Skull: No calvarial fracture or suspicious osseous lesion. No scalp
swelling or hematoma.

Sinuses/Orbits: Circumferential mucosal thickening within the
maxillary sinuses. Minimal thickening in the anterior ethmoids.
Pneumatized secretions noted sphenoid sinuses. There are
hypertrophic, hyperostotic changes throughout the paranasal sinuses.
Bilateral mastoid effusions are noted as well. Included orbital
structures are unremarkable.

Other: None
IMPRESSION: 1. No CT evidence of acute intracranial abnormality.
2. Hyperdense left parafalcine lesion with dural tails most
compatible with meningioma as classified as comparison MRI. No
significant locoregional mass effect.
3. Chronic microvascular angiopathy and parenchymal volume loss.
4. Features of acute on chronic sinusitis.  Correlate with symptoms.
5. Bilateral mastoid effusions.

## 2019-08-16 MED ORDER — LORAZEPAM 2 MG/ML IJ SOLN
1.0000 mg | Freq: Four times a day (QID) | INTRAMUSCULAR | Status: DC | PRN
  Administered 2019-08-16: 1 mg via INTRAVENOUS
  Filled 2019-08-16: qty 1

## 2019-08-16 MED ORDER — CHLORHEXIDINE GLUCONATE 0.12 % MT SOLN
15.0000 mL | Freq: Two times a day (BID) | OROMUCOSAL | Status: DC
  Administered 2019-08-17 – 2019-08-20 (×5): 15 mL via OROMUCOSAL
  Filled 2019-08-16 (×6): qty 15

## 2019-08-16 MED ORDER — IPRATROPIUM-ALBUTEROL 0.5-2.5 (3) MG/3ML IN SOLN
3.0000 mL | Freq: Three times a day (TID) | RESPIRATORY_TRACT | Status: DC
  Administered 2019-08-16 – 2019-08-20 (×12): 3 mL via RESPIRATORY_TRACT
  Filled 2019-08-16 (×12): qty 3

## 2019-08-16 NOTE — Consult Note (Signed)
PHARMACY CONSULT NOTE - FOLLOW UP  Pharmacy Consult for Electrolyte Monitoring and Replacement   Antonio [name] is a morbidly obese 74 y.o. male admitted on 07/27/2019.  Pharmacy consulted to assist with monitoring and replacing electrolytes.    Recent Labs: Potassium (mmol/L)  Date Value  08/16/2019 3.5   Magnesium (mg/dL)  Date Value  08/15/2019 2.4   Calcium (mg/dL)  Date Value  08/16/2019 8.2 (L)   Albumin (g/dL)  Date Value  08/14/2019 2.7 (L)   Phosphorus (mg/dL)  Date Value  08/14/2019 3.7   Sodium (mmol/L)  Date Value  08/16/2019 149 (H)   Corrected Ca: 9.2 mg/dL  Assessment/Plan: 1. Electrolytes (goal: electrolytes wnl):   Sodium levels still improving: on D5W with potassium 40 mEq/L at 100 mL/hr   Potassium has slightly improved since yesterday on D540  Still receiving furosemide 40mg  IV daily  Continue D540 at 137mL/hr  F/U electrolytes in am  2. Glucose:   Last 24h range: 100-123  On insulin aspart 0-15 units [place] three times a day with meals and insulin aspart 0-5 units [place] daily at bedtime.  Total SSI from 1/26: zero units   Continue monitoring pt's eating/drinking habits, continue insulin administrations, and monitor glucose daily.   3. Constipation:   Last BM: 1/27  Not on any meds for constipation  Continue to monitor and add constipation-relief meds if constipation occurs.    Dallie Piles ,PharmD 08/16/2019 10:50 AM

## 2019-08-16 NOTE — Progress Notes (Signed)
PT Cancellation Note  Patient Details Name: Antonio [name] MRN: [id number] DOB: April 01, 1946   Cancelled Treatment:    Reason Eval/Treat Not Completed: Fatigue/lethargy limiting ability to participate.  Chart reviewed.  Nurse reports pt recently given Ativan (per chart 1405).  Pt sleeping in bed upon PT arrival.  Able to wake pt briefly a few times (pt opened eyes) with sternal rub and vc's but each time pt quickly closed his eyes and fell back asleep.  Discussed with pt's nurse.  Will re-attempt PT treatment session at a later date/time.  Leitha Bleak, PT 08/16/19, 2:55 PM

## 2019-08-16 NOTE — Progress Notes (Signed)
SLP Cancellation Note  Patient Details Name: Rea Parchem MRN: [id number] DOB: 07-Dec-1945   Cancelled treatment:       Reason Eval/Treat Not Completed: Fatigue/lethargy limiting ability to participate;Patient not medically ready(chart reviewed; consulted NSG re: pt's status). Upon attempting to see pt for tx session today, pt did not fully arouse to Max verbal/tactile stim - he only opened his eyes briefly then fell asleep again. NSG reported having to give Ativan d/t agitation earlier this afternoon around Lunch time. Noted pt had not eaten his Lunch meal.  Per NSG, pt remains Confused and requires verbal/tactile cues for follow through.  Recommend holding any po's IF not fully alert/awake for safe oral intake. Recommend frequent oral care for hygiene and stimulation of swallowing. ST services will f/u w/ pt's status while admitted. NSG agreed.      [name], MS, CCC-SLP [facility]leigh Colclasure 08/16/2019, 4:31 PM

## 2019-08-16 NOTE — Progress Notes (Signed)
PROGRESS NOTE    Antonio [name]  UDJ:[id number] DOB: [birth year] DOA: 07/27/2019 PCP: Patient, No Pcp Per   Brief Narrative:  Antonio [name] is a 74 yo male with a history of alcohol abuse, tobacco abuse and suspected COPD who presented to the ED on 07/27/2019 after being found down by EMS. He was admitted to the ICU in acute respiratory distress, intubated and sedated. On further workup, he was found to have a grade I diastolic dysfunction, moderate aortic stenosis and AKI.  Extubated on 08/11/2019.  Did develop resistant hypernatremia which did not respond to free water initially, started on desmopressin.  Subjective: Patient appears very lethargic and not responding to any commands.  Just open eyes.  Remains nonverbal.  According to nursing staff patient talks appropriately at times.  Assessment & Plan:   Active Problems:   Respiratory failure (HCC)   Hypernatremia   Pulmonary infiltrates   Pulmonary disease  Acute hypoxic and hypercapnic respiratory failure.  Resolved.  Now saturating well on room air.  Initially treated for COPD exacerbation.  He was initially treated with multiple antibiotics in ICU which include Zosyn, vancomycin, ceftriaxone and Unasyn.  Now off antibiotics.  No UDS in the system.  Saturating well on RA.  No wheezing. -Continue DuoNeb treatment. -PT are recommending SNF placement, social worker are working on it.  Hypernatremia.  Resistant hypernatremia  Did not responded well to desmopressin. ACTH stim test done on 08/08/2019 with baseline cortisol of 19 and appropriate response to ACTH. Sodium improved to 148 today.  Serum osmolality today was 332.  Urine osmolality was 861.  These labs are more consistent with dehydration or nonrenal water loss.  Appropriate ADH response. Free water deficit improved to 2.9 as compared to 5.2 yesterday with D5 infusion. -Continue D5 at 100 mL/h. -Continue to monitor.  Hypokalemia.  Resolved.  Magnesium within normal  limit. -Continue to monitor, replete electrolyte as needed.  History of alcohol abuse.  Out of withdrawal now. -We will need counseling against alcohol once mental status improves. -Ativan as needed for agitation.  Encephalopathy.  Most likely secondary to serious illness and hypernatremia. Imaging and EEG was negative for any acute changes. -Continue to monitor. -Supportive care.  Acute diastolic heart failure.  Echo with grade 1 diastolic heart failure. -Holding Lasix 40 mg daily, as giving him free water for hypernatremia. -Lasix can be restarted if needed. -Monitor BMP. - intake and output. -Daily weight, approximately more than 25 pound loss since admission.  Hypertension.  Blood pressure within goal. -Continue amlodipine and metoprolol.  Objective: Vitals:   08/15/19 2026 08/16/19 0756 08/16/19 1207 08/16/19 1413  BP:   (!) 151/70   Pulse:   79   Resp:   18   Temp:   98.2 F (36.8 C)   TempSrc:      SpO2: 97% 100% 100% 98%  Weight:      Height:        Intake/Output Summary (Last 24 hours) at 08/16/2019 1553 Last data filed at 08/16/2019 1400 Gross per 24 hour  Intake 2780.74 ml  Output --  Net 2780.74 ml   Filed Weights   08/12/19 0500 08/13/19 0425 08/14/19 0500  Weight: 108 kg 104 kg 101.7 kg    Examination:  General exam: Chronically ill-appearing obese gentleman, appears older than the stated age.  In no acute distress. Respiratory system: Clear to auscultation. Respiratory effort normal. Cardiovascular system: S1 & S2 heard, RRR. No JVD, murmurs, rubs, gallops or clicks. Gastrointestinal system: Soft,  nontender, nondistended, bowel sounds positive. Central nervous system: Lethargic and not following any commands. Extremities: Bilateral upper extremity edema, no lower extremity edema, no cyanosis, pulses intact and symmetrical. Psychiatry: Judgement and insight appear impaired.  DVT prophylaxis: Lovenox Code Status: DNR Family Communication: No  family at bedside. Disposition Plan: Pending improvement and SNF placement. Patient is critically ill with bad prognosis at this time.  High risk for deterioration.  Consultants:   PCCM  Cardiology  Neurology  Procedures:  Antimicrobials:   Data Reviewed: I have personally reviewed following labs and imaging studies  CBC: Recent Labs  Lab 08/11/19 0406 08/12/19 0307 08/13/19 0415 08/14/19 0500 08/15/19 0613  WBC 10.3 8.3 9.3 7.7 7.0  NEUTROABS 7.6 6.0  --   --   --   HGB 11.7* 11.8* 12.0* 12.0* 11.9*  HCT 37.9* 39.2 39.5 39.7 37.9*  MCV 103.0* 104.0* 103.7* 103.7* 101.3*  PLT 249 262 283 281 177   Basic Metabolic Panel: Recent Labs  Lab 08/11/19 0406 08/11/19 1419 08/12/19 0307 08/12/19 0703 08/13/19 0415 08/13/19 0732 08/13/19 1117 08/13/19 1800 08/14/19 0500 08/15/19 0613 08/16/19 0753  NA 155*   < > 153*   < > 156*   < > 155* 155* 153* 155* 149*  K 3.4*   < > 3.8  --  3.8  --   --   --  3.2* 3.3* 3.5  CL 118*   < > 116*  --  119*  --   --   --  118* 119* 117*  CO2 30   < > 31  --  28  --   --   --  _0 GLUCOSE 125*   < > 92  --  110*  --   --   --  128* 117* 115*  BUN 46*   < > 37*  --  41*  --   --   --  41* 33* 21  CREATININE 0.87   < > 0.79  --  1.02  --   --   --  0.96 0.93 0.88  CALCIUM 8.5*   < > 8.4*  --  8.5*  --   --   --  8.4* 8.4* 8.2*  MG 2.6*  --   --   --  3.0*  --   --   --  2.8* 2.4  --   PHOS 2.9  --   --   --   --   --  3.5  --  3.7  --   --    < > = values in this interval not displayed.   GFR: Estimated Creatinine Clearance: 83.6 mL/min (by C-G formula based on SCr of 0.88 mg/dL). Liver Function Tests: Recent Labs  Lab 08/14/19 0500  ALBUMIN 2.7*   No results for input(s): LIPASE, AMYLASE in the last 168 hours. Recent Labs  Lab 08/14/19 1109  AMMONIA 15   Coagulation Profile: No results for input(s): INR, PROTIME in the last 168 hours. Cardiac Enzymes: No results for input(s): CKTOTAL, CKMB, CKMBINDEX, TROPONINI  in the last 168 hours. BNP (last 3 results) No results for input(s): PROBNP in the last 8760 hours. HbA1C: No results for input(s): HGBA1C in the last 72 hours. CBG: Recent Labs  Lab 08/15/19 1626 08/15/19 2110 08/15/19 2202 08/16/19 0740 08/16/19 1158  GLUCAP 100* 119* 110* 109* 104*   Lipid Profile: No results for input(s): CHOL, HDL, LDLCALC, TRIG, CHOLHDL, LDLDIRECT in the last 72 hours. Thyroid Function Tests:  No results for input(s): TSH, T4TOTAL, FREET4, T3FREE, THYROIDAB in the last 72 hours. Anemia Panel: No results for input(s): VITAMINB12, FOLATE, FERRITIN, TIBC, IRON, RETICCTPCT in the last 72 hours. Sepsis Labs: Recent Labs  Lab 08/10/19 0406  PROCALCITON 0.25    Recent Results (from the past 240 hour(s))  CULTURE, BLOOD (ROUTINE X 2) w Reflex to ID Panel     Status: None   Collection Time: 08/08/19  9:27 AM   Specimen: BLOOD  Result Value Ref Range Status   Specimen Description BLOOD RIGHT FOOT  Final   Special Requests   Final    BOTTLES DRAWN AEROBIC AND ANAEROBIC Blood Culture adequate volume   Culture   Final    NO GROWTH 5 DAYS Performed at [facility], [address] [place], [place] [address]    Report Status 08/13/2019 FINAL  Final  CULTURE, BLOOD (ROUTINE X 2) w Reflex to ID Panel     Status: None   Collection Time: 08/08/19  9:27 AM   Specimen: BLOOD  Result Value Ref Range Status   Specimen Description BLOOD LEFT FOOT  Final   Special Requests   Final    BOTTLES DRAWN AEROBIC AND ANAEROBIC Blood Culture results may not be optimal due to an excessive volume of blood received in culture bottles   Culture   Final    NO GROWTH 5 DAYS Performed at [facility], [address] [place], [place] [address]    Report Status 08/13/2019 FINAL  Final  MRSA PCR Screening     Status: None   Collection Time: 08/08/19  9:43 AM   Specimen: Nasopharyngeal  Result Value Ref Range Status   MRSA by PCR NEGATIVE NEGATIVE Final     Comment:        The GeneXpert MRSA Assay (FDA approved for NASAL specimens only), is one component of a comprehensive MRSA colonization surveillance program. It is not intended to diagnose MRSA infection nor to guide or monitor treatment for MRSA infections. Performed at [facility], [address] [place], [place] [address]   Culture, respiratory (non-expectorated)     Status: None   Collection Time: 08/08/19  5:01 PM   Specimen: Tracheal Aspirate; Respiratory  Result Value Ref Range Status   Specimen Description   Final    TRACHEAL ASPIRATE Performed at [facility], [address] [place], [place] [address]    Special Requests   Final    NONE Performed at [facility], [address] [place], [place] [address]    Gram Stain   Final    RARE WBC PRESENT, PREDOMINANTLY PMN RARE GRAM POSITIVE COCCI IN PAIRS Performed at [facility], [place] [address] [place], [place] [address]    Culture RARE STAPHYLOCOCCUS AUREUS  Final   Report Status 08/12/2019 FINAL  Final   Organism ID, Bacteria STAPHYLOCOCCUS AUREUS  Final      Susceptibility   Staphylococcus aureus - MIC*    CIPROFLOXACIN <=0.5 SENSITIVE Sensitive     ERYTHROMYCIN <=0.25 SENSITIVE Sensitive     GENTAMICIN <=0.5 SENSITIVE Sensitive     OXACILLIN 0.5 SENSITIVE Sensitive     TETRACYCLINE <=1 SENSITIVE Sensitive     VANCOMYCIN 1 SENSITIVE Sensitive     TRIMETH/SULFA <=10 SENSITIVE Sensitive     CLINDAMYCIN <=0.25 SENSITIVE Sensitive     RIFAMPIN <=0.5 SENSITIVE Sensitive     Inducible Clindamycin NEGATIVE Sensitive     * RARE STAPHYLOCOCCUS AUREUS  Culture, respiratory (non-expectorated)  Status: None   Collection Time: 08/10/19 10:04 AM   Specimen: Bronchoalveolar Lavage; Respiratory  Result Value Ref Range Status   Specimen Description   Final    BRONCHIAL ALVEOLAR LAVAGE Performed at [facility], [address] [place], [place] [address]    Special  Requests   Final    NONE Performed at [facility], [address] [place], [place] [address]    Gram Stain NO WBC SEEN NO ORGANISMS SEEN   Final   Culture   Final    RARE Consistent with normal respiratory flora. Performed at [facility], [place] [address], [place], [place] [address]    Report Status 08/12/2019 FINAL  Final     Radiology Studies: No results found.  Scheduled Meds: . amLODipine  10 mg Oral Daily  . chlorhexidine  15 mL Mouth/Throat BID  . Chlorhexidine Gluconate Cloth  6 each Topical Daily  . enoxaparin (LOVENOX) injection  40 mg Subcutaneous Q24H  . famotidine  20 mg Oral BID  . folic acid  1 mg Oral Daily  . furosemide  40 mg Intravenous Daily  . insulin aspart  0-15 Units Subcutaneous TID WC  . insulin aspart  0-5 Units Subcutaneous QHS  . ipratropium-albuterol  3 mL Nebulization Q6H  . nystatin  5 mL Oral QID  . potassium chloride  40 mEq Oral Once  . thiamine  100 mg Oral Daily   Continuous Infusions: . dextrose 5 % with kcl 100 mL/hr at 08/16/19 1400     LOS: 19 days   Time spent: 35 minutes.  Lorella Nimrod, MD [facility] Pager [phone]  If 7PM-7AM, please contact night-coverage [email or web address] Password [facility] 08/16/2019, 3:53 PM   This record has been created using Dragon voice recognition software. Errors have been sought and corrected,but may not always be located. Such creation errors do not reflect on the standard of care.

## 2019-08-16 NOTE — Progress Notes (Signed)
[place] responded to page to Pt's room. Ch stayed until RR team finished with the Pt, before being taken for a CT.   08/16/19 2148  Clinical Encounter Type  Visited With Patient;Health care provider  Visit Type Trauma

## 2019-08-17 ENCOUNTER — Inpatient Hospital Stay: Payer: Medicare Other

## 2019-08-17 DIAGNOSIS — G9341 Metabolic encephalopathy: Secondary | ICD-10-CM

## 2019-08-17 DIAGNOSIS — E870 Hyperosmolality and hypernatremia: Secondary | ICD-10-CM

## 2019-08-17 DIAGNOSIS — R4182 Altered mental status, unspecified: Secondary | ICD-10-CM

## 2019-08-17 DIAGNOSIS — I10 Essential (primary) hypertension: Secondary | ICD-10-CM

## 2019-08-17 LAB — BASIC METABOLIC PANEL
Anion gap: 9 (ref 5–15)
BUN: 17 mg/dL (ref 8–23)
CO2: 23 mmol/L (ref 22–32)
Calcium: 8.1 mg/dL — ABNORMAL LOW (ref 8.9–10.3)
Chloride: 116 mmol/L — ABNORMAL HIGH (ref 98–111)
Creatinine, Ser: 0.99 mg/dL (ref 0.61–1.24)
GFR calc Af Amer: >60 mL/min (ref >60)
GFR calc non Af Amer: >60 mL/min (ref >60)
Glucose, Bld: 97 mg/dL (ref 70–99)
Potassium: 4.1 mmol/L (ref 3.5–5.1)
Sodium: 148 mmol/L — ABNORMAL HIGH (ref 135–145)

## 2019-08-17 LAB — MISC LABCORP TEST (SEND OUT)
LabCorp test name: 10447
Labcorp test code: 10447

## 2019-08-17 LAB — RENAL FUNCTION PANEL
Albumin: 2.7 g/dL — ABNORMAL LOW (ref 3.5–5.0)
Anion gap: 6 (ref 5–15)
BUN: 15 mg/dL (ref 8–23)
CO2: 25 mmol/L (ref 22–32)
Calcium: 8.2 mg/dL — ABNORMAL LOW (ref 8.9–10.3)
Chloride: 113 mmol/L — ABNORMAL HIGH (ref 98–111)
Creatinine, Ser: 0.83 mg/dL (ref 0.61–1.24)
GFR calc Af Amer: >60 mL/min (ref >60)
GFR calc non Af Amer: >60 mL/min (ref >60)
Glucose, Bld: 107 mg/dL — ABNORMAL HIGH (ref 70–99)
Phosphorus: 3.3 mg/dL (ref 2.5–4.6)
Potassium: 4.2 mmol/L (ref 3.5–5.1)
Sodium: 144 mmol/L (ref 135–145)

## 2019-08-17 LAB — GLUCOSE, CAPILLARY
Glucose-Capillary: 94 mg/dL (ref 70–99)
Glucose-Capillary: 96 mg/dL (ref 70–99)
Glucose-Capillary: 96 mg/dL (ref 70–99)
Glucose-Capillary: 123 mg/dL — ABNORMAL HIGH (ref 70–99)
Glucose-Capillary: 95 mg/dL (ref 70–99)

## 2019-08-17 LAB — MAGNESIUM: Magnesium: 2.2 mg/dL (ref 1.7–2.4)

## 2019-08-17 LAB — PHOSPHORUS: Phosphorus: 3.2 mg/dL (ref 2.5–4.6)

## 2019-08-17 LAB — LIPID PANEL
Cholesterol: 131 mg/dL (ref 0–200)
HDL: 21 mg/dL — ABNORMAL LOW (ref >40)
LDL Cholesterol: 89 mg/dL (ref 0–99)
Total CHOL/HDL Ratio: 6.2 RATIO
Triglycerides: 106 mg/dL (ref <150)
VLDL: 21 mg/dL (ref 0–40)

## 2019-08-17 LAB — TSH: TSH: 2.152 u[IU]/mL (ref 0.350–4.500)

## 2019-08-17 LAB — TROPONIN I (HIGH SENSITIVITY)
Troponin I (High Sensitivity): 78 ng/L — ABNORMAL HIGH (ref <18)
Troponin I (High Sensitivity): 54 ng/L — ABNORMAL HIGH (ref <18)

## 2019-08-17 IMAGING — US US CAROTID DUPLEX BILAT
1 series · 13 of 24 positions shown · non-contrast
Comparison: None.

CLINICAL DATA: 73-year-old male with a history of TIA

EXAM:
BILATERAL CAROTID DUPLEX ULTRASOUND
TECHNIQUE: Gray scale imaging, color Doppler and duplex ultrasound were
performed of bilateral carotid and vertebral arteries in the neck.

[Series 1: us carotid duplex bilat · 13 of 66 slices shown]
[im 1/66]
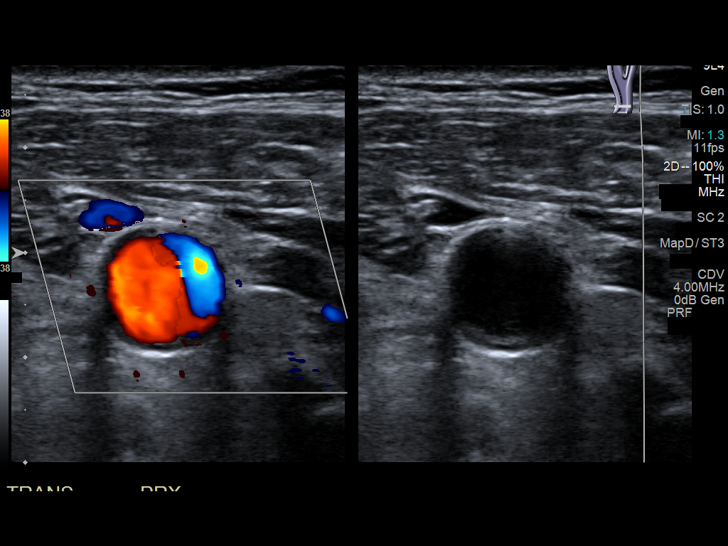
[im 6/66]
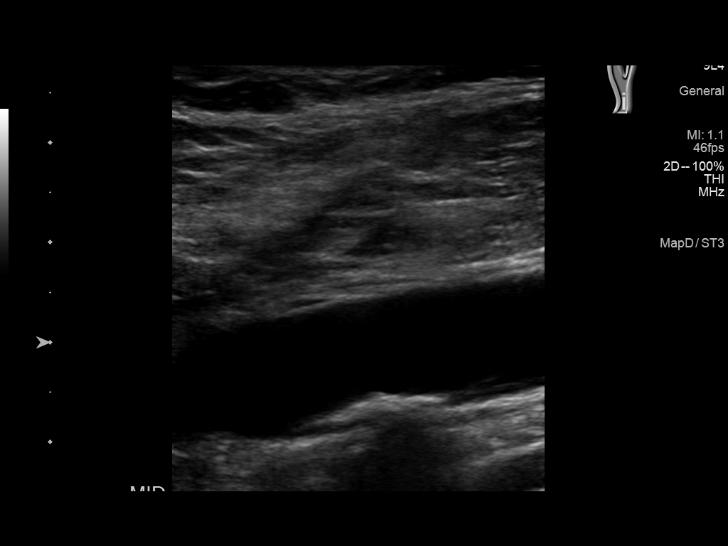
[im 12/66]
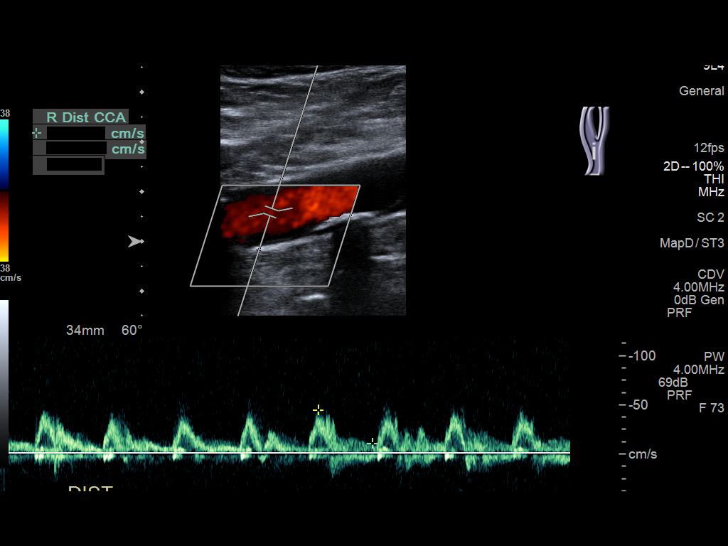
[im 17/66]
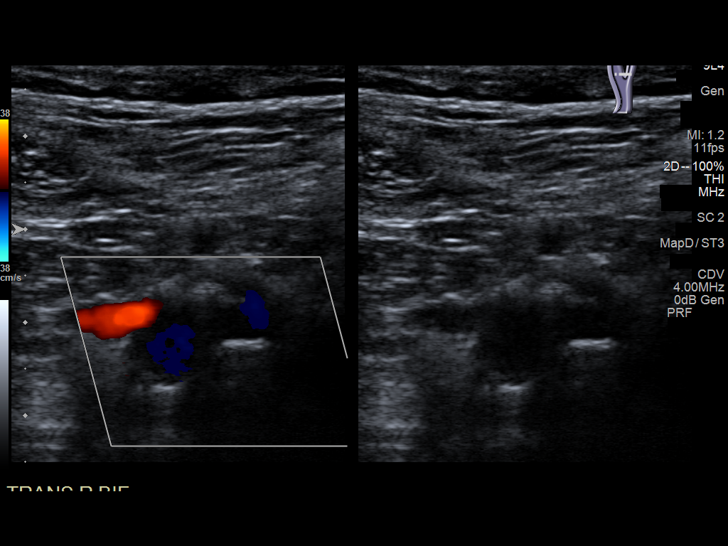
[im 23/66]
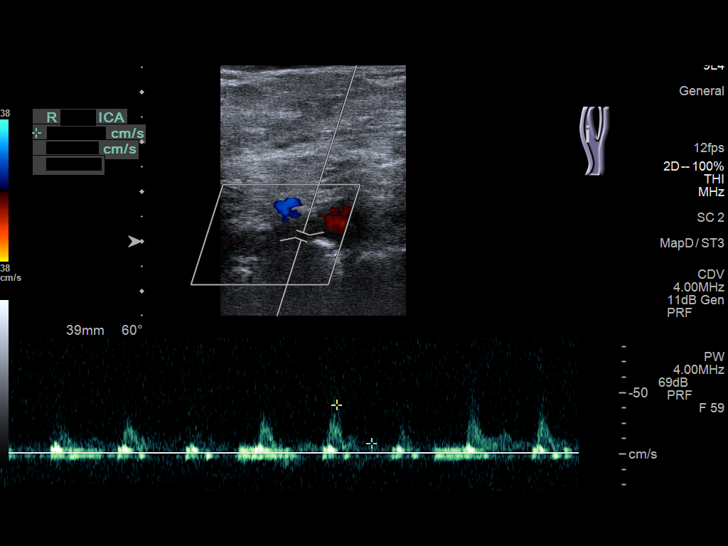
[im 29/66]
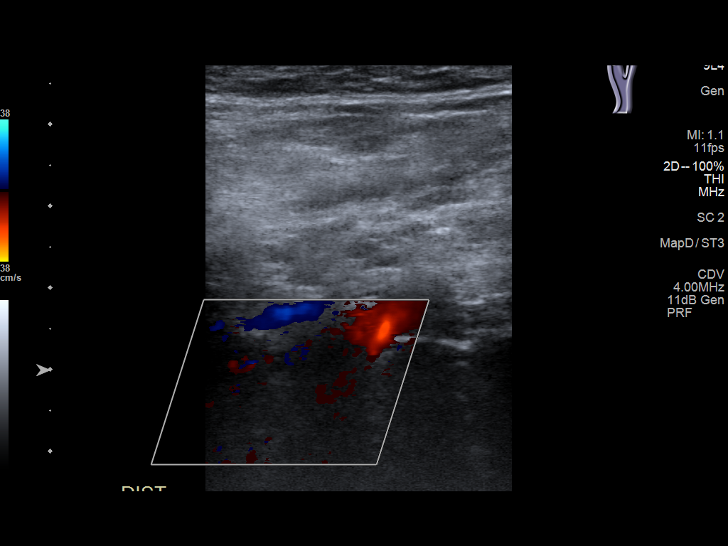
[im 34/66]
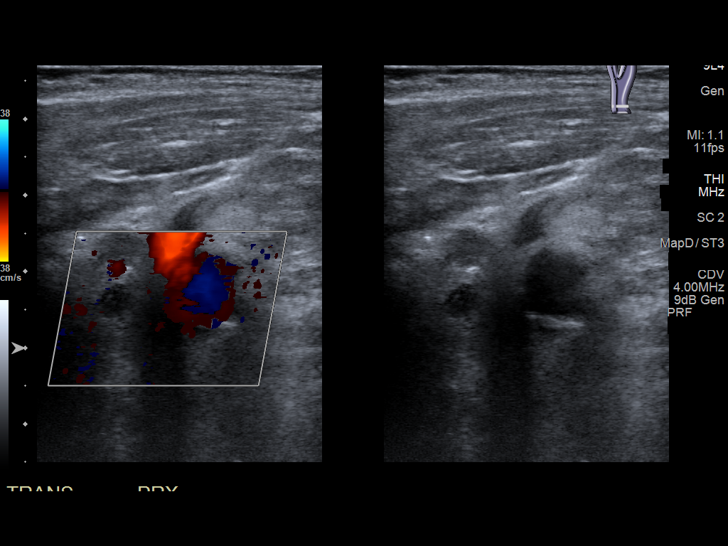
[im 37/66]
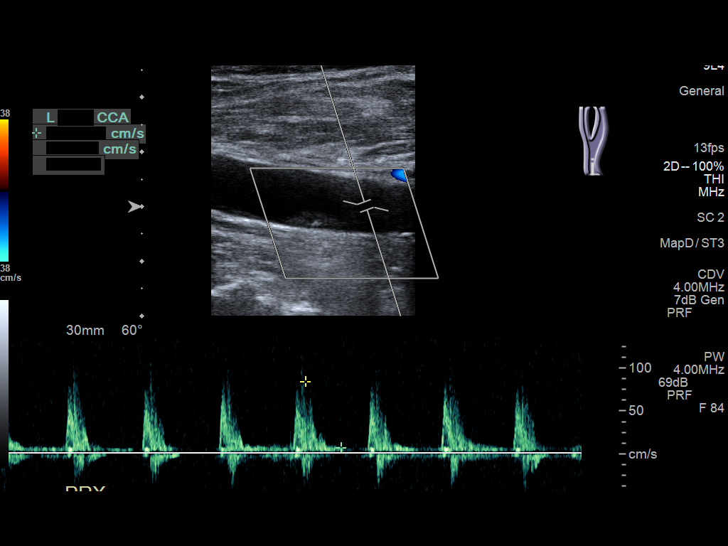
[im 43/66]
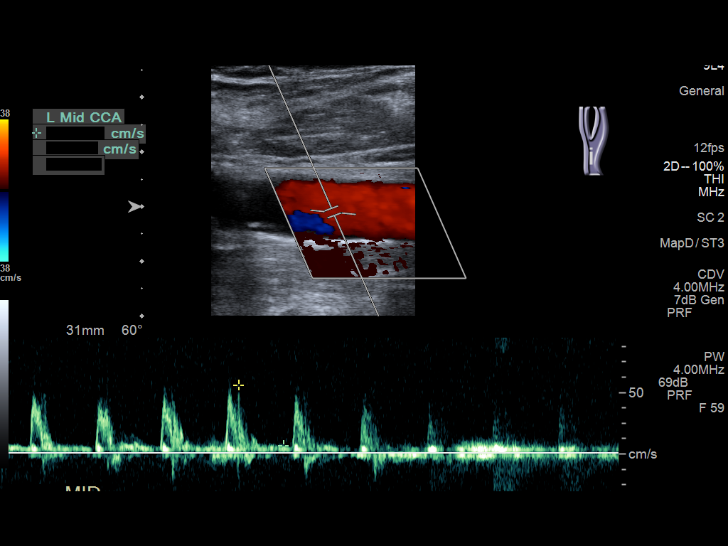
[im 49/66]
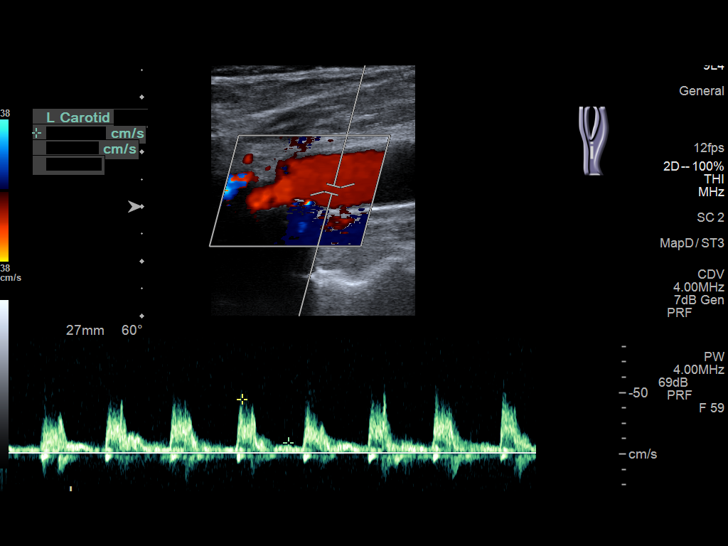
[im 54/66]
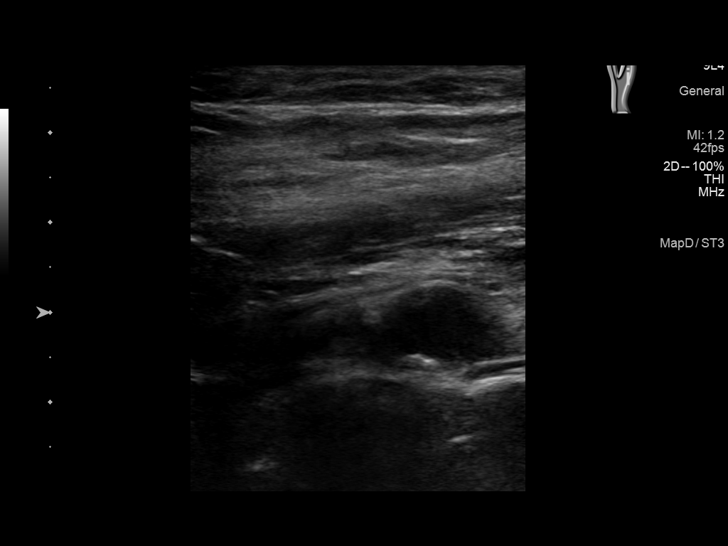
[im 60/66]
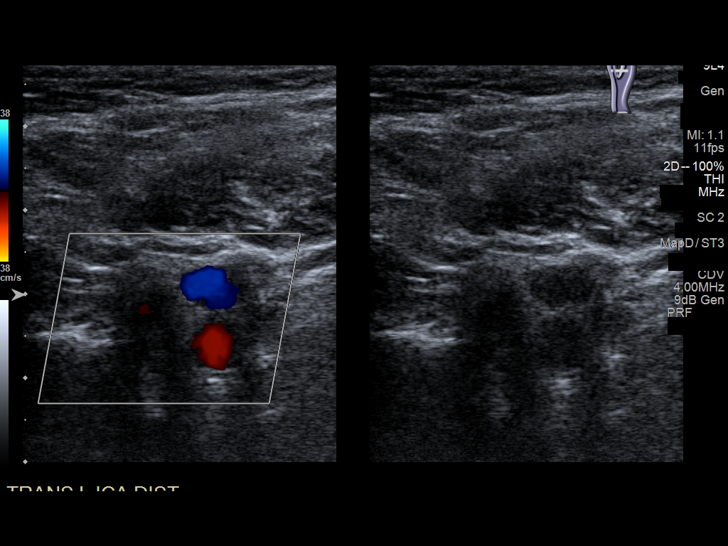
[im 66/66]
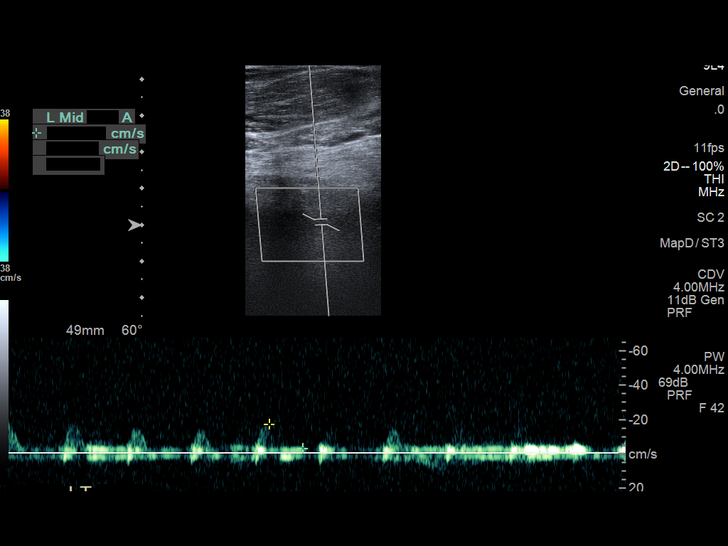

[13 of 24 positions shown; findings below may reference images not displayed]

FINDINGS: Criteria: Quantification of carotid stenosis is based on velocity
parameters that correlate the residual internal carotid diameter
with NASCET-based stenosis levels, using the diameter of the distal
internal carotid lumen as the denominator for stenosis measurement.

The following velocity measurements were obtained:

RIGHT

ICA:  Systolic 52 cm/sec, Diastolic 14 cm/sec

CCA:  82 cm/sec

SYSTOLIC ICA/CCA RATIO:

ECA:  119 cm/sec

LEFT

ICA:  Systolic 92 cm/sec, Diastolic 19 cm/sec

CCA:  56 cm/sec

SYSTOLIC ICA/CCA RATIO:

ECA:  147 cm/sec

Right Brachial SBP: Not acquired

Left Brachial SBP: Not acquired

RIGHT CAROTID ARTERY: No significant calcified disease of the right
common carotid artery. Intermediate waveform maintained.
Heterogeneous plaque without significant calcifications at the right
carotid bifurcation. Low resistance waveform of the right ICA. No
significant tortuosity.

RIGHT VERTEBRAL ARTERY: Antegrade flow with low resistance waveform.

LEFT CAROTID ARTERY: No significant calcified disease of the left
common carotid artery. Intermediate waveform maintained.
Heterogeneous plaque at the left carotid bifurcation without
significant calcifications. Low resistance waveform of the left ICA.

LEFT VERTEBRAL ARTERY:  Antegrade flow with low resistance waveform.
IMPRESSION: Color duplex indicates moderate heterogeneous plaque with no
hemodynamically significant stenosis by duplex criteria in the
extracranial cerebrovascular circulation.

## 2019-08-17 MED ORDER — DEXTROSE 5 % IV SOLN: INTRAVENOUS | Status: DC

## 2019-08-17 MED ORDER — CHLORHEXIDINE GLUCONATE CLOTH 2 % EX PADS
6.0000 | MEDICATED_PAD | Freq: Every day | CUTANEOUS | Status: DC
  Administered 2019-08-17 – 2019-08-18 (×2): 6 via TOPICAL

## 2019-08-17 MED ORDER — FLUTICASONE PROPIONATE 50 MCG/ACT NA SUSP
2.0000 | Freq: Every day | NASAL | Status: DC
  Administered 2019-08-17 – 2019-08-20 (×3): 2 via NASAL
  Filled 2019-08-17: qty 16

## 2019-08-17 MED ORDER — NEPRO/CARBSTEADY PO LIQD
237.0000 mL | Freq: Three times a day (TID) | ORAL | Status: DC
  Administered 2019-08-17 – 2019-08-20 (×5): 237 mL via ORAL

## 2019-08-17 NOTE — Care Management Important Message (Signed)
Important Message  Patient Details  Name: Antonio [name] MRN: [id number] Date of Birth: April 18, 1946   Medicare Important Message Given:  Yes     [name] 08/17/2019, 11:13 AM

## 2019-08-17 NOTE — Progress Notes (Signed)
Patient found nonresponsive. This was a sudden change from the patient's baseline of being pleasantly confused. VSS, CBG 102, Rapid Response Team called and NP came to the bedside. CT of the head ordered and labs (see orders). Will continue to monitor patient.

## 2019-08-17 NOTE — Progress Notes (Signed)
Called to bedside this evening for patient found by nurse to be non responsive when had been alert and appropriately conversive/oriented at baseline one hour prio.  Bedside assess , patient with eyes open, PERRL, no tracking, + dolls eyes, no true reaction to firm sternal pressure applied.  No extremity reaction to noxious stimuli applied. VSS . Sats 100 %, HR 74 BP 146/40 and blood surgar 102.  Respirations deep but non labored. Abdomen soft and non distended.  After approx 20 minutes patient di intermittently look sid to side but not to track or to command. He is withdrawing in all 4 extremities to noxious stimuli.  DDX initially: acute CVA. TIA, worsening hypernatremia, elevated ammonia, sequelae of continued metabolic encephalopathy, seizure, acute cardiac event  CT head w/o contrast  08/16/2019 "IMPRESSION: 1. No CT evidence of acute intracranial abnormality. 2. Hyperdense left parafalcine lesion with dural tails most compatible with meningioma as classified as comparison MRI. No significant locoregional mass effect. 3. Chronic microvascular angiopathy and parenchymal volume loss. 4. Features of acute on chronic sinusitis.  Correlate with symptoms. 5. Bilateral mastoid effusions." all chronic finds noted on previous imaging.   Echo 07-29-2019 1. Left ventricular ejection fraction, by visual estimation, is 55 to 60%. The left ventricle has normal function. There is mild to moderately increased left ventricular hypertrophy.  2. Left ventricular diastolic parameters are consistent with Grade I diastolic dysfunction (impaired relaxation).  3. Left ventricle with no regional wall motion abnormalities.  4. Global right ventricle has normal systolic function.The right ventricular size is normal. No increase in right ventricular wall thickness.  5. Left atrial size was normal.  6. The aortic valve is calcified, not well visualized. Moderate aortic valve stenosis.  7. The inferior vena cava is  normal in size with <50% respiratory variability, suggesting right atrial pressure of 8 mmHg.  8. TR signal is inadequate for assessing pulmonary artery systolic pressure.  9. Challenging images, definity used.  Troponin 86 and repeat non concerning at 78 EKG with NSR, non specific ST t wave changes Sodium improved, potassium normal and mag level with minor elevation   Lactic acid and CBC normal. amonia level and transaminases also noted to be normal.  EEG to rule out seizure activity ordered.  No neurology services available for onsite routine consult today but can f/u with tele neuro services if any acute findings that need further recommendation.  Ultrasound bilateral carotids ordered for TIA workup.  Previous Echo and MRI as noted.  Defer need for repeat MRI to further assess acute event to day team.   flonase started for acute on chronic sinusitis noted on CT.

## 2019-08-17 NOTE — Progress Notes (Signed)
Nutrition Follow Up Note   DOCUMENTATION CODES:   Obesity unspecified  INTERVENTION:   Nepro Shake po TID, each supplement provides 425 kcal and 19 grams protein  Magic cup TID with meals, each supplement provides 290 kcal and 9 grams of protein  Calorie count in progress- results will be calculated and available on Monday 2/1  NUTRITION DIAGNOSIS:   Inadequate oral intake related to inability to eat as evidenced by NPO status.  GOAL:   Patient will meet greater than or equal to 90% of their needs -not met   MONITOR:   PO intake, Supplement acceptance, Diet advancement, Labs, Weight trends, I & O's  ASSESSMENT:   74 yo male with a history of alcohol abuse, tobacco abuse and suspected COPD who presented to the ED on 07/27/2019 after being found down by EMS.  He was admitted to the ICU in acute respiratory distress, intubated and sedated.  On further workup, he was found to have a grade I diastolic dysfunction, moderate aortic stenosis and AKI.  Extubated on 08/11/2019.   Pt being followed by SLP; pt initiated on a dysphagia 1/honey thick diet 1/25 and then advanced to nectar thick on 1/26. MD concerned that patient may not be eating enough to meet his estimated needs. Plan is for 48 hour calorie count today; results on this will be reported on 2/1. RD will add nectar thick drink supplements; recommend continue Magic Cups.    Per chart, pt down ~20lbs since admit; RD unsure what pt's UBW is as there is no weight history in chart.   Medications reviewed and include: lovenox, pepcid, folic acid, insulin, KCl, thiamine, 5% dextrose '@100ml'$ /hr  Labs reviewed: Na 148(H), K 4.1 wnl, Mg 2.2 wnl Hgb 10.9(L), Hct 34.6(L)  Diet Order:   Diet Order            DIET - DYS 1 Room service appropriate? Yes with Assist; Fluid consistency: Nectar Thick  Diet effective now             EDUCATION NEEDS:   No education needs have been identified at this time  Skin:  Skin Assessment: Skin  Integrity Issues:(MSAD to groin)  Last BM:  1/28- type 6  Height:   Ht Readings from Last 1 Encounters:  08/08/19 5' 7.01" (1.702 m)   Weight:   Wt Readings from Last 1 Encounters:  08/17/19 106 kg   Ideal Body Weight:  67.3 kg  BMI:  Body mass index is 36.58 kg/m.  Estimated Nutritional Needs:   Kcal:  2200-2400  Protein:  110-120 grams  Fluid:  1.7-2 L/day  Koleen Distance MS, RD, LDN Pager #- [phone] Office#- [phone] After Hours Pager: [phone]

## 2019-08-17 NOTE — Progress Notes (Signed)
eeg completed ° °

## 2019-08-17 NOTE — Progress Notes (Signed)
Physical Therapy Treatment Patient Details Name: Antonio [name] MRN: [id number] DOB: [birth year] Today's Date: 08/17/2019    History of Present Illness Antonio [name] is a 74 yo male with a history of alcohol abuse, tobacco abuse and suspected COPD who presented to the ED on 07/27/2019 after being found down by EMS.  He was admitted to the ICU in acute respiratory distress, intubated and sedated.  On further workup, he was found to have a grade I diastolic dysfunction, moderate aortic stenosis and AKI.  Extubated on 08/11/2019. MRI of the brain reviewed and shows an incidental small meningioma and left temporal encephalomalcia but no etiology for mental status.    PT Comments    Pt resting in bed with eyes open upon PT arrival.  Pt occasionally talking with therapist and minimal conversation appearing appropriate, but pt laughed when therapist discussed assisting pt with sitting on edge of bed so therapist told pt she would get a 2nd assist to help.  With 2nd assist present, therapist encouraged pt to assist with bringing LE's towards edge of bed (no initiation of movement noted with max cueing so therapist provided total assist with bringing LE's towards edge of bed).  Therapist attempted to sit pt up but pt would not initiate any movement of UE's or trunk to assist with max cueing.  Pt just shook his head and did not initiate any movement to assist (therapist attempted to assist pt with movement but when therapist attempted to move B LE's or B UE's, limbs felt very heavy to lift and no initiation of movement noted).  Therapist educated pt on benefit of participating in therapy and attempting to sit up (and spent time encouraging pt to participate as much as he could) but pt did not initiate any movement to assist (and unable to sit pt up safely with 2 assist d/t significant assist levels required) so therapist assisted with repositioning in bed and with 2 assist pt boosted up in bed using bed sheet and  repositioned for comfort.  Will continue to focus on strengthening, ROM, and progressive functional mobility as able.   Follow Up Recommendations  SNF     Equipment Recommendations  3in1 (PT);Wheelchair (measurements PT);Wheelchair cushion (measurements PT)(hoyer lift)    Recommendations for Other Services       Precautions / Restrictions Precautions Precautions: Fall Precaution Comments: R UE edematous; R UE PICC line Restrictions Weight Bearing Restrictions: No    Mobility  Bed Mobility Overal bed mobility: Needs Assistance             General bed mobility comments: therapist assisted pt with LE's bringing towards edge of bed but pt did not initiate any movement to assist with LE's or trunk with max cueing so therapist assisted with pt's B LE's to bring back into bed and then 2 assist to boost pt up in bed to reposition  Transfers                 General transfer comment: Not appropriate at this time  Ambulation/Gait             General Gait Details: Not appropriate at this time   Stairs             Wheelchair Mobility    Modified Rankin (Stroke Patients Only)       Balance  Cognition Arousal/Alertness: Awake/alert Behavior During Therapy: Flat affect Overall Cognitive Status: No family/caregiver present to determine baseline cognitive functioning                                 General Comments: Pt occasionally talking with therapist; did not answer A&O questions      Exercises      General Comments   Nursing cleared pt for participation in physical therapy.      Pertinent Vitals/Pain Pain Assessment: Faces Faces Pain Scale: No hurt Pain Intervention(s): Limited activity within patient's tolerance;Monitored during session;Repositioned  Vitals (HR and O2 on room air) stable and WFL throughout treatment session.    Home Living                       Prior Function            PT Goals (current goals can now be found in the care plan section) Acute Rehab PT Goals PT Goal Formulation: Patient unable to participate in goal setting Time For Goal Achievement: 09/04/19 Progress towards PT goals: Progressing toward goals    Frequency    Min 2X/week      PT Plan Current plan remains appropriate    Co-evaluation              AM-PAC PT "6 Clicks" Mobility   Outcome Measure  Help needed turning from your back to your side while in a flat bed without using bedrails?: Total Help needed moving from lying on your back to sitting on the side of a flat bed without using bedrails?: Total Help needed moving to and from a bed to a chair (including a wheelchair)?: Total Help needed standing up from a chair using your arms (e.g., wheelchair or bedside chair)?: Total Help needed to walk in hospital room?: Total Help needed climbing 3-5 steps with a railing? : Total 6 Click Score: 6    End of Session   Activity Tolerance: Patient tolerated treatment well Patient left: in bed;with call bell/phone within reach;with bed alarm set;Other (comment)(B heels floating via pillow) Nurse Communication: Mobility status;Precautions PT Visit Diagnosis: Difficulty in walking, not elsewhere classified (R26.2);Other abnormalities of gait and mobility (R26.89);Muscle weakness (generalized) (M62.81)     Time: 1016-1040 PT Time Calculation (min) (ACUTE ONLY): 24 min  Charges:  $Therapeutic Activity: 23-37 mins                     Gertha Lichtenberg, PT 08/17/19, 11:05 AM

## 2019-08-17 NOTE — Procedures (Signed)
Patient Name: Antonio [name]  MRN: [id number]  Epilepsy Attending: Lora Havens  Referring Physician/Provider: Dr Lorella Nimrod Date: 08/17/2019 Duration: 26.02 mins  Patient history: 74 year old male with history of alcohol abuse and COPD who presented on 07/27/2019 after being found down by EMS.  Patient was extubated on 08/11/2019.  On 08/17/2019 he had an episode of worsening altered mental status.  EEG to evaluate for seizure  Level of alertness: awake   AEDs during EEG study: None  Technical aspects: This EEG study was done with scalp electrodes positioned according to the 10-20 International system of electrode placement. Electrical activity was acquired at a sampling rate of 500Hz  and reviewed with a high frequency filter of 70Hz  and a low frequency filter of 1Hz . EEG data were recorded continuously and digitally stored.   Description: During awake state, no clear posterior dominant rhythm was seen.  EEG showed continuous generalized 3 to 6 Hz theta-delta slowing.Hyperventilation and photic stimulation were not performed.  Abnormality - Continuous slow, generalized  IMPRESSION: This study is suggestive of mild to moderate diffuse encephalopathy, nonspecific to etiology. No seizures or epileptiform discharges were seen throughout the recording.  Osha Rane Barbra Sarks

## 2019-08-17 NOTE — Progress Notes (Signed)
PT Cancellation Note  Patient Details Name: Antonio [name] MRN: [id number] DOB: 1946-01-04   Cancelled Treatment:    Reason Eval/Treat Not Completed: Patient at procedure or test/unavailable.  Pt currently not in room.  Discussed pt with pt's nurse.  Rapid response noted yesterday.  Will re-attempt PT treatment session at a later date/time.   Leitha Bleak, PT 08/17/19, 10:03 AM

## 2019-08-17 NOTE — Progress Notes (Signed)
  Speech Language Pathology Treatment: Dysphagia  Patient Details Name: Antonio [name] MRN: [id number] DOB: March 01, 1946 Today's Date: 08/17/2019 Time: [id number] SLP Time Calculation (min) (ACUTE ONLY): 40 min  Assessment / Plan / Recommendation Clinical Impression  Patient seen for ongoing assessment of diet toleration.  Patient was ordered a dysphagia 1 diet 08/14/2019; intake has been poor.  The patient had a Rapid Response called 1 AM this morning.  The patient is now alert and verbally responsive, stating he wants steak and eggs.  The patient was given applesauce, portion of his puree meal, and nectar-thick liquids.  He was able to take  of the applesauce, 1/3 of his meal, and 4 ounces of thickened juice.  He had no overt indicators of aspiration and no observed oral residue.  The patient refused more food or liquid at that point.  Recommend continuing current diet.  The patient requires total assistance for eating and drinking as well as encouragement to increase oral intake.  Will continue to follow for diet toleration and potential upgrade as appropriate.   HPI        SLP Plan  Continue with current plan of care       Recommendations  Diet recommendations: Dysphagia 1 (puree);Nectar-thick liquid Liquids provided via: Cup;Straw Medication Administration: Crushed with puree Supervision: Staff to assist with self feeding;Full supervision/cueing for compensatory strategies Compensations: Minimize environmental distractions;Slow rate;Small sips/bites;Lingual sweep for clearance of pocketing;Multiple dry swallows after each bite/sip;Follow solids with liquid Postural Changes and/or Swallow Maneuvers: Seated upright 90 degrees;Upright 30-60 min after meal                Oral Care Recommendations: Oral care BID;Oral care before and after PO;Staff/trained caregiver to provide oral care Follow up Recommendations: Skilled Nursing facility SLP Visit Diagnosis: Dysphagia, oropharyngeal  phase (R13.12) Plan: Continue with current plan of care       GO               Leroy Sea, [place], Susie 08/17/2019, 12:55 PM

## 2019-08-17 NOTE — Progress Notes (Signed)
[facility] [facility] [place] NAME: Antonio [name]    MR#:  [id number]  DATE OF BIRTH:  1945/09/20  SUBJECTIVE:   Patient sitting upright in the bed. His wide awake. Wants egg and steak to eat. Does not like thickened liquid Some baseline confusion. Knows is name. Not oriented to place REVIEW OF SYSTEMS:   Review of Systems  Unable to perform ROS: Medical condition   Tolerating Diet:some Tolerating PT: rec SNF  DRUG ALLERGIES:  Not on File  VITALS:  Blood pressure (!) 161/45, pulse 84, temperature (!) 97.4 F (36.3 C), temperature source Axillary, resp. rate 16, height 5' 7.01" (1.702 m), weight 106 kg, SpO2 97 %.  PHYSICAL EXAMINATION:   Physical Exam  GENERAL:  74 y.o.-year-old patient lying in the bed with no acute distress. Obese EYES: Pupils equal, round, reactive to light and accommodation. No scleral icterus.   HEENT: Head atraumatic, normocephalic. Oropharynx and nasopharynx clear. Coated tongue NECK:  Supple, no jugular venous distention. No thyroid enlargement, no tenderness.  LUNGS: Normal breath sounds bilaterally, no wheezing, rales, rhonchi. No use of accessory muscles of respiration.  CARDIOVASCULAR: S1, S2 normal. No murmurs, rubs, or gallops.  ABDOMEN: Soft, nontender, nondistended. Bowel sounds present. No organomegaly or mass. abdominal obesity+ EXTREMITIES: No cyanosis, clubbing or edema b/l.   Puffy extremities NEUROLOGIC: Cranial nerves II through XII are intact. No focal Motor or sensory deficits b/l.   PSYCHIATRIC:  patient is alert but judgment and insight impaired SKIN: No obvious rash, lesion, or ulcer.  LABORATORY PANEL:  CBC Recent Labs  Lab 08/16/19 2212  WBC 4.9  HGB 10.9*  HCT 34.6*  PLT 237    Chemistries  Recent Labs  Lab 08/16/19 2211 08/16/19 2211 08/17/19 0229 08/17/19 0229 08/17/19 1133  NA 147*   < > 148*   < > 144  K 3.7   < > 4.1   < > 4.2  CL 116*   < > 116*   < > 113*  CO2 23    < > 23   < > 25  GLUCOSE 109*   < > 97   < > 107*  BUN 17   < > 17   < > 15  CREATININE 0.86   < > 0.99   < > 0.83  CALCIUM 8.1*   < > 8.1*   < > 8.2*  MG 2.3   < > 2.2  --   --   AST 57*  --   --   --   --   ALT 94*  --   --   --   --   ALKPHOS 51  --   --   --   --   BILITOT 0.7  --   --   --   --    < > = values in this interval not displayed.   Cardiac Enzymes No results for input(s): TROPONINI in the last 168 hours. RADIOLOGY:  EEG  Result Date: 08/17/2019 Lora Havens, MD     08/17/2019 10:29 AM Patient Name: Antonio [name] MRN: [id number] Epilepsy Attending: Lora Havens Referring Physician/Provider: Dr Lorella Nimrod Date: 08/17/2019 Duration: 26.02 mins Patient history: 74 year old male with history of alcohol abuse and COPD who presented on 07/27/2019 after being found down by EMS.  Patient was extubated on 08/11/2019.  On 08/17/2019 he had an episode of worsening altered mental status.  EEG to evaluate for seizure Level of alertness:  awake AEDs during EEG study: None Technical aspects: This EEG study was done with scalp electrodes positioned according to the 10-20 International system of electrode placement. Electrical activity was acquired at a sampling rate of 500Hz  and reviewed with a high frequency filter of 70Hz  and a low frequency filter of 1Hz . EEG data were recorded continuously and digitally stored. Description: During awake state, no clear posterior dominant rhythm was seen.  EEG showed continuous generalized 3 to 6 Hz theta-delta slowing.Hyperventilation and photic stimulation were not performed. Abnormality - Continuous slow, generalized IMPRESSION: This study is suggestive of mild to moderate diffuse encephalopathy, nonspecific to etiology. No seizures or epileptiform discharges were seen throughout the recording. Lora Havens   CT HEAD WO CONTRAST  Result Date: 08/16/2019 CLINICAL DATA:  Altered mental status, unclear cause EXAM: CT HEAD WITHOUT CONTRAST TECHNIQUE:  Contiguous axial images were obtained from the base of the skull through the vertex without intravenous contrast. COMPARISON:  MRI 08/07/2019 FINDINGS: Brain: Hyperdense left parafalcine lesion with dural tails most compatible meningioma as classified comparison MRI measuring 12 x 0.5 cm in size (2/26). No significant locoregional mass effect. There is remote gliosis in the left temporal region which may reflect sequela of prior infarct. No evidence of acute infarction, hemorrhage, hydrocephalus, extra-axial collection or mass lesion/mass effect. Symmetric prominence of the ventricles, cisterns and sulci compatible with parenchymal volume loss. Patchy areas of white matter hypoattenuation are most compatible with chronic microvascular angiopathy. Vascular: Atherosclerotic calcification of the carotid siphons and intradural vertebral arteries. No hyperdense vessel. Skull: No calvarial fracture or suspicious osseous lesion. No scalp swelling or hematoma. Sinuses/Orbits: Circumferential mucosal thickening within the maxillary sinuses. Minimal thickening in the anterior ethmoids. Pneumatized secretions noted sphenoid sinuses. There are hypertrophic, hyperostotic changes throughout the paranasal sinuses. Bilateral mastoid effusions are noted as well. Included orbital structures are unremarkable. Other: None IMPRESSION: 1. No CT evidence of acute intracranial abnormality. 2. Hyperdense left parafalcine lesion with dural tails most compatible with meningioma as classified as comparison MRI. No significant locoregional mass effect. 3. Chronic microvascular angiopathy and parenchymal volume loss. 4. Features of acute on chronic sinusitis.  Correlate with symptoms. 5. Bilateral mastoid effusions. Electronically Signed   By: Lovena Le M.D.   On: 08/16/2019 23:00   US Carotid Bilateral  Result Date: 08/17/2019 CLINICAL DATA:  74 year old male with a history of TIA EXAM: BILATERAL CAROTID DUPLEX ULTRASOUND TECHNIQUE: Pearline Cables  scale imaging, color Doppler and duplex ultrasound were performed of bilateral carotid and vertebral arteries in the neck. COMPARISON:  None. FINDINGS: Criteria: Quantification of carotid stenosis is based on velocity parameters that correlate the residual internal carotid diameter with NASCET-based stenosis levels, using the diameter of the distal internal carotid lumen as the denominator for stenosis measurement. The following velocity measurements were obtained: RIGHT ICA:  Systolic 52 cm/sec, Diastolic 14 cm/sec CCA:  82 cm/sec SYSTOLIC ICA/CCA RATIO:  0.6 ECA:  119 cm/sec LEFT ICA:  Systolic 92 cm/sec, Diastolic 19 cm/sec CCA:  56 cm/sec SYSTOLIC ICA/CCA RATIO:  1.6 ECA:  147 cm/sec Right Brachial SBP: Not acquired Left Brachial SBP: Not acquired RIGHT CAROTID ARTERY: No significant calcified disease of the right common carotid artery. Intermediate waveform maintained. Heterogeneous plaque without significant calcifications at the right carotid bifurcation. Low resistance waveform of the right ICA. No significant tortuosity. RIGHT VERTEBRAL ARTERY: Antegrade flow with low resistance waveform. LEFT CAROTID ARTERY: No significant calcified disease of the left common carotid artery. Intermediate waveform maintained. Heterogeneous plaque at the left carotid bifurcation  without significant calcifications. Low resistance waveform of the left ICA. LEFT VERTEBRAL ARTERY:  Antegrade flow with low resistance waveform. IMPRESSION: Color duplex indicates moderate heterogeneous plaque with no hemodynamically significant stenosis by duplex criteria in the extracranial cerebrovascular circulation. Signed, Dulcy Fanny. Dellia Nims, RPVI [facility] Radiology Electronically Signed   By: Corrie Mckusick D.O.   On: 08/17/2019 08:53   ASSESSMENT AND PLAN:  Antonio [name] is a 74 yo male with a history of alcohol abuse, tobacco abuse and suspected COPD who presented to the ED on 07/27/2019  after being found down by EMS. He was admitted to the ICU in acute respiratory distress, intubated and sedated. On further workup, he was found to have a grade I diastolic dysfunction, moderate aortic stenosis and AKI.  Extubated on 08/11/2019.   Acute hypoxic and hypercapnic respiratory failure. -  Resolved.  Now saturating well on room air.  -Initially treated for COPD exacerbation.  He was initially treated with multiple antibiotics in ICU which include Zosyn, vancomycin, ceftriaxone and Unasyn.  Now off antibiotics.  - No UDS in the system.   -Saturating well on RA.  No wheezing. -Continue DuoNeb treatment. -PT are recommending SNF placement   Severe Hypernatremia.   -  Did not responded well to desmopressin. ACTH stim test done on 08/08/2019 with baseline cortisol of 19 and appropriate response to ACTH. -Sodium improved to 144 today. -Serum osmolality today was 332.  Urine osmolality was 861.  These labs are more consistent with dehydration or nonrenal water loss.  Appropriate ADH response. -cont with one more day of D5 infusion. -Continue D5 at75 mL/h.  Hypokalemia.  Resolved.  Magnesium within normal limit. -Continue to monitor, replete electrolyte as needed.  History of alcohol abuse.  Out of withdrawal now. -We will need counseling against alcohol once mental status improves. -Ativan as needed for agitation.  Metabolic Encephalopathy.  - Most likely secondary to critical illness, infection, hypoxia and hypernatremia. -Imaging and EEG was negative for any acute changes. -Supportive care.  Acute diastolic heart failure--now resolved - Echo with grade 1 diastolic heart failure. -Holding Lasix 40 mg daily, as giving him free water for hypernatremia. -Lasix can be restarted if needed. - intake and output.  Hypertension.  Blood pressure within goal. -Continue amlodipine and metoprolol.  Nutrition--calorie count started since pt dislikes current consistency food. He had  tube feeding till 08/13/2019 (ICU)  Family communication : son Yemen Consults :ICU, ST,PT Discharge Disposition : rehab CODE STATUS: DNR DVT Prophylaxis :enoxaparin Barriers to discharge: calorie count started given poor po intake. Anticipate d/c Monday if continues to eat better and labs remain stable.  TOTAL TIME TAKING CARE OF THIS PATIENT: *30* minutes.  >50% time spent on counselling and coordination of care  Note: This dictation was prepared with Dragon dictation along with smaller phrase technology. Any transcriptional errors that result from this process are unintentional.  Fritzi Mandes M.D    [facility]   CC: Primary care physician; Patient, No Pcp PerPatient ID: Antonio [name], male   DOB: 15-Jul-1946, 74 y.o.   MRN: [id number]

## 2019-08-17 NOTE — Consult Note (Signed)
PHARMACY CONSULT NOTE - FOLLOW UP  Pharmacy Consult for Electrolyte Monitoring and Replacement   Antonio [name] is a morbidly obese 74 y.o. male admitted on 07/27/2019.  Pharmacy consulted to assist with monitoring and replacing electrolytes.    Recent Labs: Potassium (mmol/L)  Date Value  08/17/2019 4.1   Magnesium (mg/dL)  Date Value  08/17/2019 2.2   Calcium (mg/dL)  Date Value  08/17/2019 8.1 (L)   Albumin (g/dL)  Date Value  08/16/2019 2.5 (L)   Phosphorus (mg/dL)  Date Value  08/14/2019 3.7   Sodium (mmol/L)  Date Value  08/17/2019 148 (H)   Corrected Ca: 9.3 mg/dL  Assessment/Plan: 1. Electrolytes (goal: electrolytes wnl):   Sodium levels: on D5W with potassium 40 mEq/L at 100 mL/hr    2. Glucose:   Last 24h range: 94-97  On insulin aspart 0-15 units [name] three times a day with meals and insulin aspart 0-5 units [name] daily at bedtime.  Total SSI since 1/27: zero units   Continue monitoring pt's eating/drinking habits, continue insulin administrations, and monitor glucose daily.   3. Constipation:   Last BM: 1/27  Not on any meds for constipation  Continue to monitor and add constipation-relief meds if constipation occurs.    As consults originated in ICU, will sign off at this time.    [name] ,PharmD 08/17/2019 9:55 AM

## 2019-08-18 ENCOUNTER — Inpatient Hospital Stay: Payer: Medicare Other

## 2019-08-18 LAB — GLUCOSE, CAPILLARY
Glucose-Capillary: 88 mg/dL (ref 70–99)
Glucose-Capillary: 105 mg/dL — ABNORMAL HIGH (ref 70–99)
Glucose-Capillary: 102 mg/dL — ABNORMAL HIGH (ref 70–99)
Glucose-Capillary: 117 mg/dL — ABNORMAL HIGH (ref 70–99)

## 2019-08-18 IMAGING — US US EXTREM  UP VENOUS*R*
1 series · 13 of 24 positions shown · non-contrast
Comparison: Chest radiograph-[DATE]

CLINICAL DATA: History of PICC line placement, now with right upper
extremity edema. Evaluate for DVT.



[Series 1: us extrem up venous*right* · 13 of 37 slices shown]
[im 1/37]
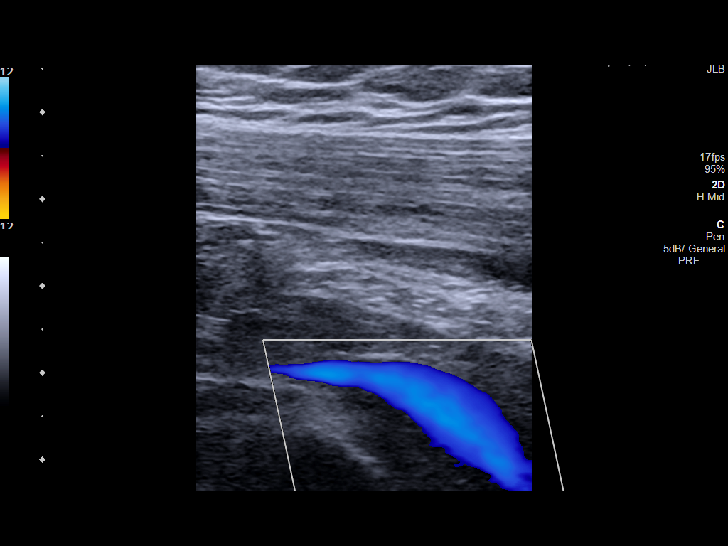
[im 4/37]
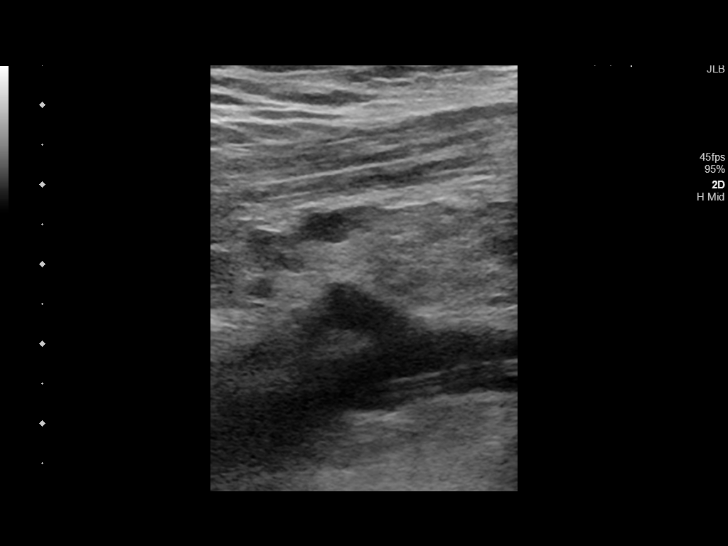
[im 7/37]
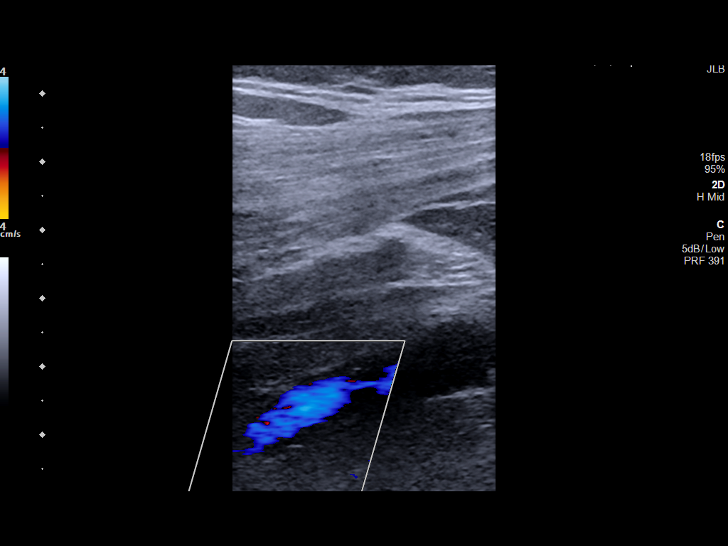
[im 10/37]
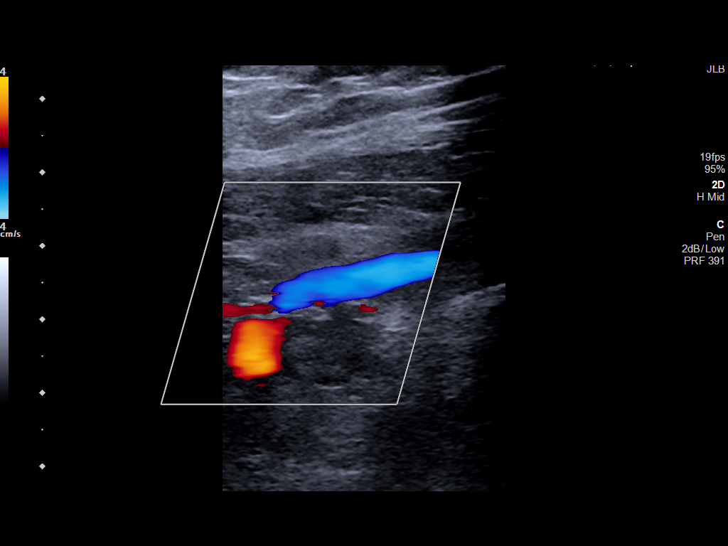
[im 13/37]
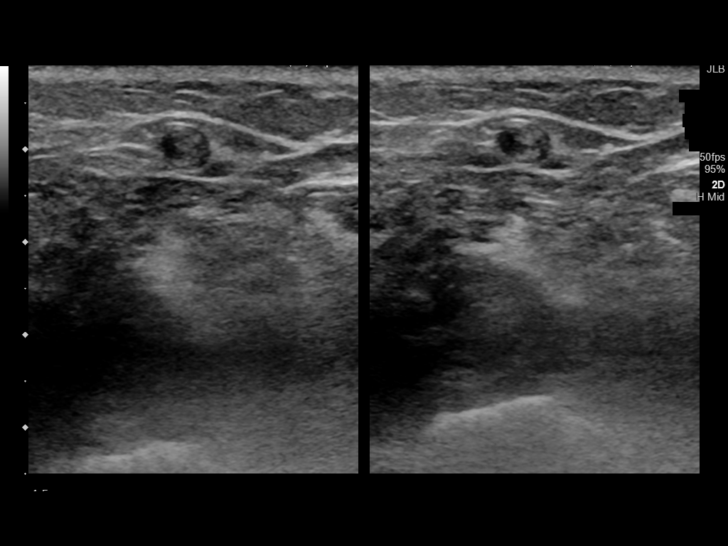
[im 16/37]
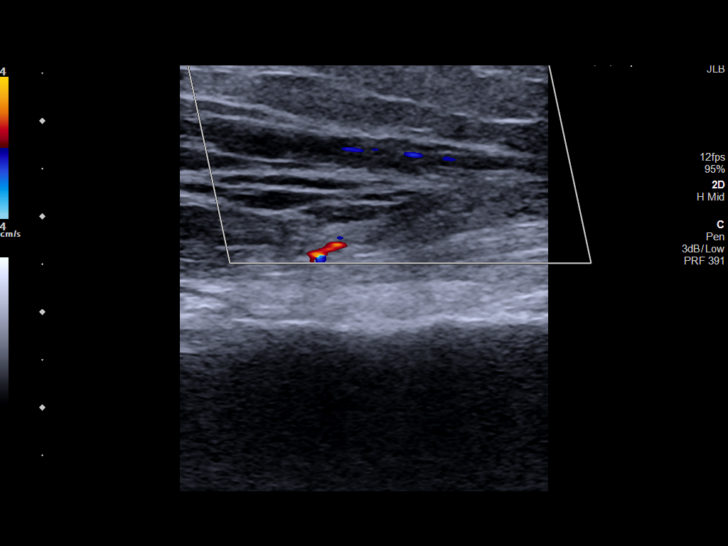
[im 19/37]
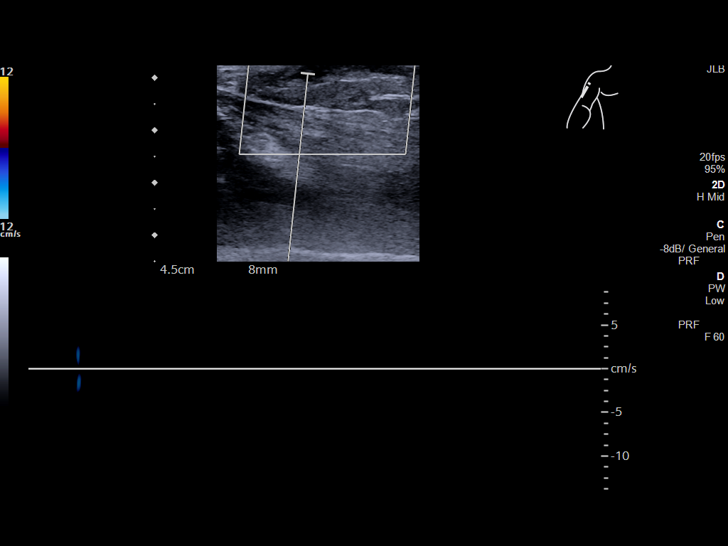
[im 21/37]
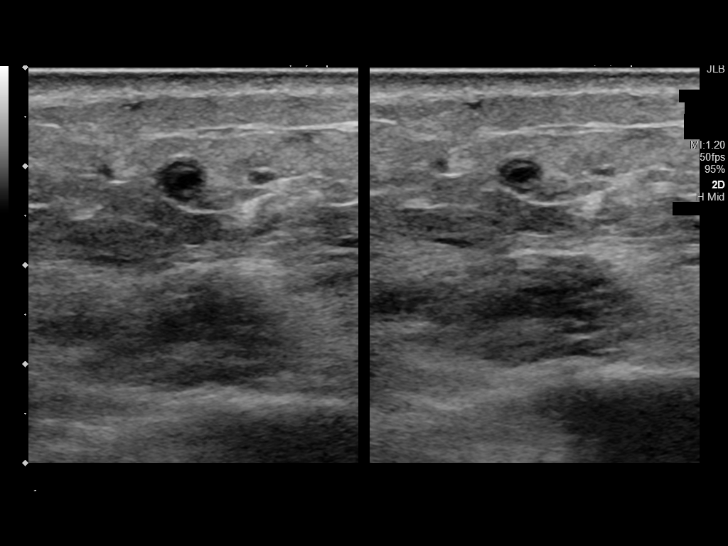
[im 24/37]
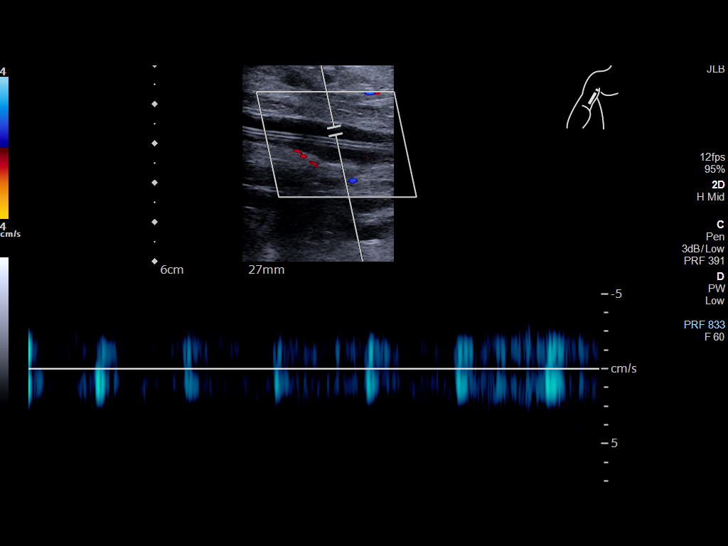
[im 27/37]
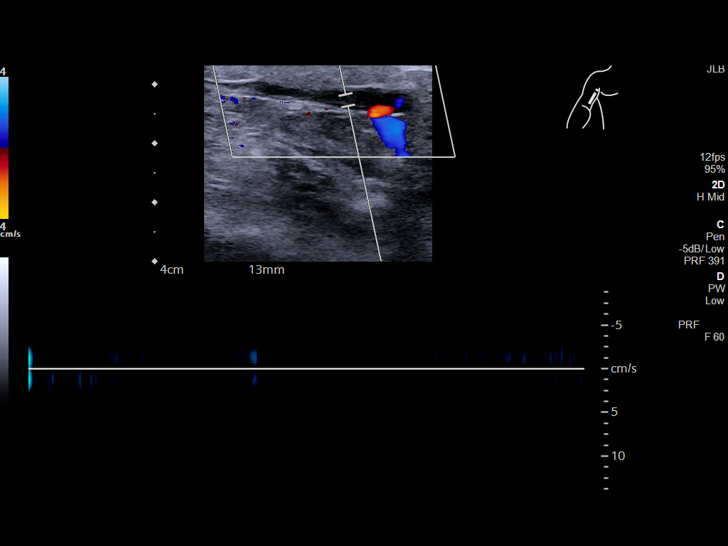
[im 30/37]
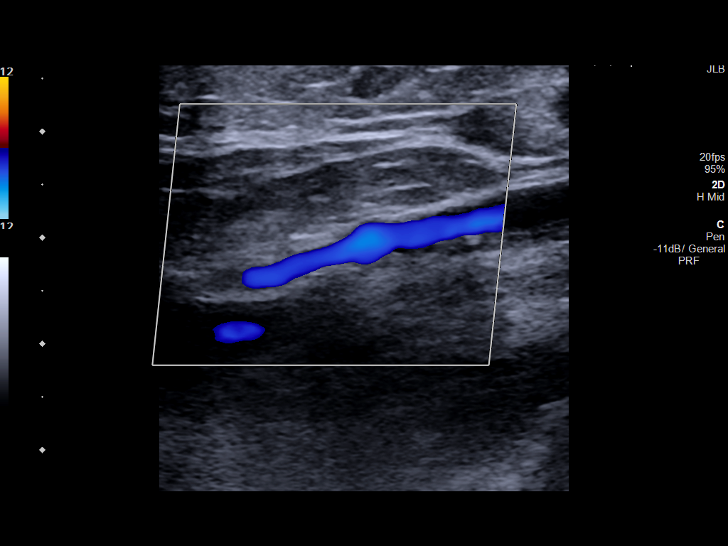
[im 33/37]
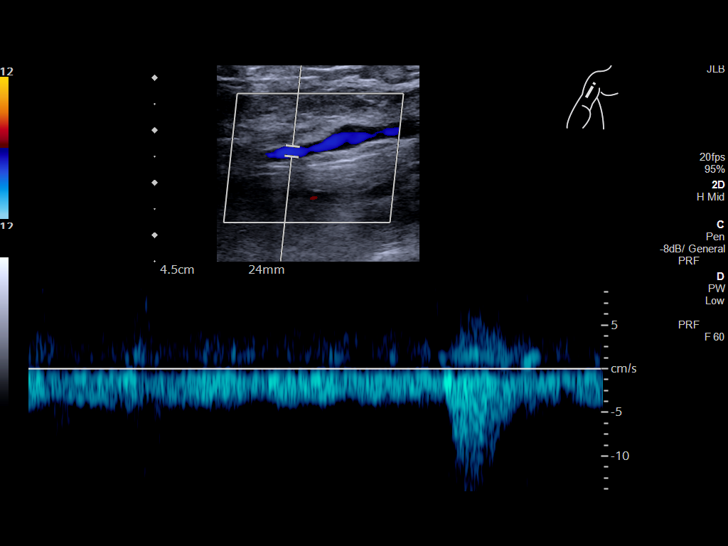
[im 37/37]
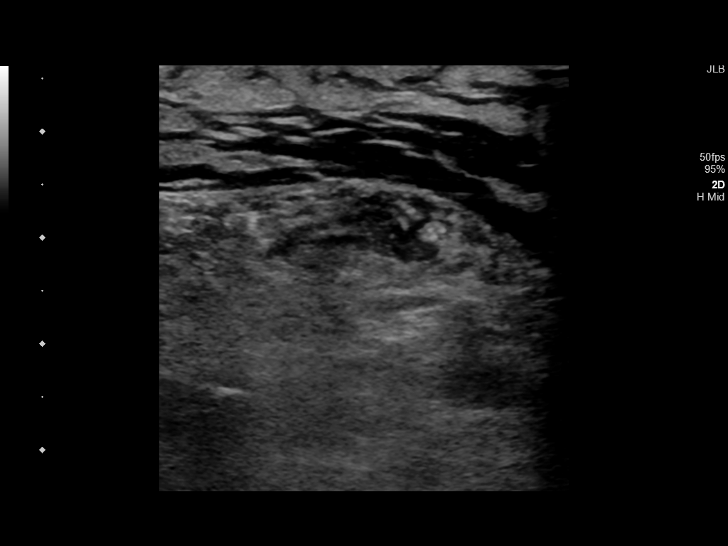

[13 of 24 positions shown; findings below may reference images not displayed]

FINDINGS: Contralateral Subclavian Vein: Respiratory phasicity is normal and
symmetric with the symptomatic side. No evidence of thrombus. Normal
compressibility.

Internal Jugular Vein: No evidence of thrombus. Normal
compressibility, respiratory phasicity and response to augmentation.

Subclavian Vein: No evidence of thrombus. Specifically, the segment
of the right subclavian vein which contains a portion of the PICC
line is noted to be widely patent. Normal compressibility,
respiratory phasicity and response to augmentation.

Axillary Vein: No evidence of thrombus. Normal compressibility,
respiratory phasicity and response to augmentation.

Cephalic Vein: There is mixed echogenic occlusive thrombus involving
the proximal (image 13 and 14) aspects of the cephalic vein. The
cephalic vein appears patent distally (image 15).

Basilic Vein: There is hypoechoic occlusive thrombus within the
basilic vein surrounding the right basilic vein approach PICC line
(images 21 through 28).

Brachial Veins: No evidence of thrombus. Normal compressibility,
respiratory phasicity and response to augmentation.

Radial Veins: No evidence of thrombus. Normal compressibility,
respiratory phasicity and response to augmentation.

Ulnar Veins: Not well visualized

Venous Reflux:  None visualized.

Other Findings: Moderate amount of edema seen at the level of the
right forearm and wrist.
IMPRESSION: 1. No evidence of DVT within the right upper extremity.
2. Examination is positive for occlusive superficial
thrombophlebitis involving the right upper extremity PICC line
containing basilic vein as well as the cephalic vein. There is no
extension of this occlusive SVT to the deep venous system of right
upper extremity. Consideration for PICC line removal could be
performed as indicated

## 2019-08-18 MED ORDER — LORAZEPAM 2 MG/ML IJ SOLN: 0.5000 mg | Freq: Three times a day (TID) | INTRAMUSCULAR | Status: DC | PRN

## 2019-08-18 MED ORDER — MIRTAZAPINE 15 MG PO TABS
7.5000 mg | ORAL_TABLET | Freq: Every day | ORAL | Status: DC
  Filled 2019-08-18: qty 1

## 2019-08-18 MED ORDER — HYDRALAZINE HCL 20 MG/ML IJ SOLN: 10.0000 mg | Freq: Four times a day (QID) | INTRAMUSCULAR | Status: DC | PRN

## 2019-08-18 NOTE — Progress Notes (Signed)
08/18/2019 1600  Noticed swelling in patient's entire right arm and hand.  Had IV team check PICC line and was informed PICC was functioning properly.  IV team RN suggested ulstrasound to rule out DVT.  Notified Dr. Posey Pronto who ordered ultrasound.  Ultrasound identified thrombophlebitis and Dr. Posey Pronto ordered PICC to be removed.  According to Dr. Posey Pronto, patient may go without IV access at this time.  Dola Argyle, RN

## 2019-08-18 NOTE — Progress Notes (Signed)
[facility] [place] NAME: Antonio [name]    MR#:  [id number]  DATE OF BIRTH:  [birth year]  SUBJECTIVE:   Patient sitting upright in the bed. He is wide awake.  Does not like thickened liquid. Ate a few bites. CNA tried to feed magi cup--pt refuses Some baseline confusion. Knows his name. Not oriented to place REVIEW OF SYSTEMS:   Review of Systems  Unable to perform ROS: Medical condition   Tolerating Diet:some Tolerating PT: rec SNF  DRUG ALLERGIES:  Not on File  VITALS:  Blood pressure 119/70, pulse 96, temperature 97.9 F (36.6 C), temperature source Oral, resp. rate 18, height 5' 7.01" (1.702 m), weight (!) 148.3 kg, SpO2 100 %.  PHYSICAL EXAMINATION:   Physical Exam  GENERAL:  74 y.o.-year-old patient lying in the bed with no acute distress. Obese EYES: Pupils equal, round, reactive to light and accommodation. No scleral icterus.   HEENT: Head atraumatic, normocephalic. Oropharynx and nasopharynx clear. Coated tongue NECK:  Supple, no jugular venous distention. No thyroid enlargement, no tenderness.  LUNGS: Normal breath sounds bilaterally, no wheezing, rales, rhonchi. No use of accessory muscles of respiration.  CARDIOVASCULAR: S1, S2 normal. No murmurs, rubs, or gallops.  ABDOMEN: Soft, nontender, nondistended. Bowel sounds present. No organomegaly or mass. abdominal obesity+ EXTREMITIES: No cyanosis, clubbing or edema b/l.   Puffy extremities NEUROLOGIC: Cranial nerves II through XII are intact. No focal Motor or sensory deficits b/l.   PSYCHIATRIC:  patient is alert but judgment and insight impaired SKIN: No obvious rash, lesion, or ulcer.  LABORATORY PANEL:  CBC Recent Labs  Lab 08/16/19 2212  WBC 4.9  HGB 10.9*  HCT 34.6*  PLT 237    Chemistries  Recent Labs  Lab 08/16/19 2211 08/16/19 2211 08/17/19 0229 08/17/19 0229 08/17/19 1133  NA 147*   < > 148*   < > 144  K 3.7   < > 4.1   < > 4.2  CL 116*   <  > 116*   < > 113*  CO2 23   < > 23   < > 25  GLUCOSE 109*   < > 97   < > 107*  BUN 17   < > 17   < > 15  CREATININE 0.86   < > 0.99   < > 0.83  CALCIUM 8.1*   < > 8.1*   < > 8.2*  MG 2.3   < > 2.2  --   --   AST 57*  --   --   --   --   ALT 94*  --   --   --   --   ALKPHOS 51  --   --   --   --   BILITOT 0.7  --   --   --   --    < > = values in this interval not displayed.   Cardiac Enzymes No results for input(s): TROPONINI in the last 168 hours. RADIOLOGY:  EEG  Result Date: 08/17/2019 Lora Havens, MD     08/17/2019 10:29 AM Patient Name: Antonio [name] MRN: [id number] Epilepsy Attending: Lora Havens Referring Physician/Provider: Dr Lorella Nimrod Date: 08/17/2019 Duration: 26.02 mins Patient history: 74 year old male with history of alcohol abuse and COPD who presented on 07/27/2019 after being found down by EMS.  Patient was extubated on 08/11/2019.  On 08/17/2019 he had an episode of worsening altered mental status.  EEG to  evaluate for seizure Level of alertness: awake AEDs during EEG study: None Technical aspects: This EEG study was done with scalp electrodes positioned according to the 10-20 International system of electrode placement. Electrical activity was acquired at a sampling rate of 500Hz  and reviewed with a high frequency filter of 70Hz  and a low frequency filter of 1Hz . EEG data were recorded continuously and digitally stored. Description: During awake state, no clear posterior dominant rhythm was seen.  EEG showed continuous generalized 3 to 6 Hz theta-delta slowing.Hyperventilation and photic stimulation were not performed. Abnormality - Continuous slow, generalized IMPRESSION: This study is suggestive of mild to moderate diffuse encephalopathy, nonspecific to etiology. No seizures or epileptiform discharges were seen throughout the recording. Lora Havens   CT HEAD WO CONTRAST  Result Date: 08/16/2019 CLINICAL DATA:  Altered mental status, unclear cause EXAM: CT HEAD  WITHOUT CONTRAST TECHNIQUE: Contiguous axial images were obtained from the base of the skull through the vertex without intravenous contrast. COMPARISON:  MRI 08/07/2019 FINDINGS: Brain: Hyperdense left parafalcine lesion with dural tails most compatible meningioma as classified comparison MRI measuring 12 x 0.5 cm in size (2/26). No significant locoregional mass effect. There is remote gliosis in the left temporal region which may reflect sequela of prior infarct. No evidence of acute infarction, hemorrhage, hydrocephalus, extra-axial collection or mass lesion/mass effect. Symmetric prominence of the ventricles, cisterns and sulci compatible with parenchymal volume loss. Patchy areas of white matter hypoattenuation are most compatible with chronic microvascular angiopathy. Vascular: Atherosclerotic calcification of the carotid siphons and intradural vertebral arteries. No hyperdense vessel. Skull: No calvarial fracture or suspicious osseous lesion. No scalp swelling or hematoma. Sinuses/Orbits: Circumferential mucosal thickening within the maxillary sinuses. Minimal thickening in the anterior ethmoids. Pneumatized secretions noted sphenoid sinuses. There are hypertrophic, hyperostotic changes throughout the paranasal sinuses. Bilateral mastoid effusions are noted as well. Included orbital structures are unremarkable. Other: None IMPRESSION: 1. No CT evidence of acute intracranial abnormality. 2. Hyperdense left parafalcine lesion with dural tails most compatible with meningioma as classified as comparison MRI. No significant locoregional mass effect. 3. Chronic microvascular angiopathy and parenchymal volume loss. 4. Features of acute on chronic sinusitis.  Correlate with symptoms. 5. Bilateral mastoid effusions. Electronically Signed   By: Lovena Le M.D.   On: 08/16/2019 23:00   US Carotid Bilateral  Result Date: 08/17/2019 CLINICAL DATA:  74 year old male with a history of TIA EXAM: BILATERAL CAROTID  DUPLEX ULTRASOUND TECHNIQUE: Pearline Cables scale imaging, color Doppler and duplex ultrasound were performed of bilateral carotid and vertebral arteries in the neck. COMPARISON:  None. FINDINGS: Criteria: Quantification of carotid stenosis is based on velocity parameters that correlate the residual internal carotid diameter with NASCET-based stenosis levels, using the diameter of the distal internal carotid lumen as the denominator for stenosis measurement. The following velocity measurements were obtained: RIGHT ICA:  Systolic 52 cm/sec, Diastolic 14 cm/sec CCA:  82 cm/sec SYSTOLIC ICA/CCA RATIO:  0.6 ECA:  119 cm/sec LEFT ICA:  Systolic 92 cm/sec, Diastolic 19 cm/sec CCA:  56 cm/sec SYSTOLIC ICA/CCA RATIO:  1.6 ECA:  147 cm/sec Right Brachial SBP: Not acquired Left Brachial SBP: Not acquired RIGHT CAROTID ARTERY: No significant calcified disease of the right common carotid artery. Intermediate waveform maintained. Heterogeneous plaque without significant calcifications at the right carotid bifurcation. Low resistance waveform of the right ICA. No significant tortuosity. RIGHT VERTEBRAL ARTERY: Antegrade flow with low resistance waveform. LEFT CAROTID ARTERY: No significant calcified disease of the left common carotid artery. Intermediate waveform maintained. Heterogeneous  plaque at the left carotid bifurcation without significant calcifications. Low resistance waveform of the left ICA. LEFT VERTEBRAL ARTERY:  Antegrade flow with low resistance waveform. IMPRESSION: Color duplex indicates moderate heterogeneous plaque with no hemodynamically significant stenosis by duplex criteria in the extracranial cerebrovascular circulation. Signed, Dulcy Fanny. Dellia Nims, RPVI [facility] Electronically Signed   By: Corrie Mckusick D.O.   On: 08/17/2019 08:53   ASSESSMENT AND PLAN:  Mr. Summerson is a 74 yo male with a history of alcohol abuse, tobacco abuse and suspected COPD who  presented to the ED on 07/27/2019 after being found down by EMS. He was admitted to the ICU in acute respiratory distress, intubated and sedated. On further workup, he was found to have a grade I diastolic dysfunction, moderate aortic stenosis and AKI.  Extubated on 08/11/2019.   Acute hypoxic and hypercapnic respiratory failure. -  Resolved.  Now saturating well on room air.  -Initially treated for COPD exacerbation.  He was initially treated with multiple antibiotics in ICU which include Zosyn, vancomycin, ceftriaxone and Unasyn.  Now off antibiotics.  - No UDS in the system.   -Saturating well on RA.  No wheezing. -Continue DuoNeb treatment. -PT  recommending SNF placement   Severe Hypernatremia.   -  Did not responded well to desmopressin. ACTH stim test done on 08/08/2019 with baseline cortisol of 19 and appropriate response to ACTH. -Sodium improved to 144  -Serum osmolality today was 332.  Urine osmolality was 861.  These labs are more consistent with dehydration or nonrenal water loss.  Appropriate ADH response. -cont with one more day of D5 infusion. -Continue D5 at75 mL/h.  Hypokalemia.  Resolved.  Magnesium within normal limit. -Continue to monitor, replete electrolyte as needed.  History of alcohol abuse.   -We will need counseling against alcohol once mental status improves.  Metabolic Encephalopathy.  - Most likely secondary to critical illness, infection, hypoxia and hypernatremia. -Imaging and EEG was negative for any acute changes. -Supportive care.  Acute diastolic heart failure--now resolved - Echo with grade 1 diastolic heart failure. -Holding Lasix 40 mg daily, as giving him free water for hypernatremia. -Lasix can be restarted if needed. - intake and output.  Hypertension.  Blood pressure within goal. -Continue amlodipine and metoprolol.  Nutrition--calorie count started since pt dislikes current consistency food. He had tube feeding till 08/13/2019  (ICU) -encourage to take po food--d/w son Edd Arbour to feed him today when he comes for visit  Family communication : son Coralee Pesa on the phone Consults :ICU, ST,PT Discharge Disposition : rehab CODE STATUS: DNR DVT Prophylaxis :enoxaparin Barriers to discharge: calorie count started given poor po intake. Anticipate d/c Monday if continues to eat better and labs remain stable.  TOTAL TIME TAKING CARE OF THIS PATIENT: *25* minutes.  >50% time spent on counselling and coordination of care  Note: This dictation was prepared with Dragon dictation along with smaller phrase technology. Any transcriptional errors that result from this process are unintentional.  Fritzi Mandes M.D    [facility]   CC: Primary care physician; Patient, No Pcp PerPatient ID: Frutoso Chase, male   DOB: 1946/01/09, 74 y.o.   MRN: [id number]

## 2019-08-18 NOTE — Progress Notes (Signed)
Received a consult from floor RN to assess patient's arm with the PICC line. Right arm is swollen, good pulses, skin warm with good color. One port infusing, other two ports of PICC flush with good blood return. Recommended RN to let the MD know and they may want to do an ultrasound to rule out a blood clot. PICC is still ok to use at this time. RN will continue to monitor.

## 2019-08-19 LAB — GLUCOSE, CAPILLARY
Glucose-Capillary: 93 mg/dL (ref 70–99)
Glucose-Capillary: 108 mg/dL — ABNORMAL HIGH (ref 70–99)
Glucose-Capillary: 120 mg/dL — ABNORMAL HIGH (ref 70–99)
Glucose-Capillary: 123 mg/dL — ABNORMAL HIGH (ref 70–99)

## 2019-08-19 MED ORDER — HALOPERIDOL 2 MG PO TABS: 2.0000 mg | ORAL_TABLET | Freq: Once | ORAL | Status: DC

## 2019-08-19 MED ORDER — MORPHINE SULFATE (PF) 2 MG/ML IV SOLN: 1.0000 mg | Freq: Once | INTRAVENOUS | Status: DC

## 2019-08-19 MED ORDER — HALOPERIDOL LACTATE 5 MG/ML IJ SOLN
2.0000 mg | Freq: Once | INTRAMUSCULAR | Status: AC
  Administered 2019-08-19: 2 mg via INTRAMUSCULAR
  Filled 2019-08-19: qty 1

## 2019-08-19 MED ORDER — MORPHINE SULFATE (PF) 2 MG/ML IV SOLN
1.0000 mg | Freq: Once | INTRAVENOUS | Status: AC
  Administered 2019-08-20: 1 mg via SUBCUTANEOUS
  Filled 2019-08-19: qty 1

## 2019-08-19 NOTE — Progress Notes (Signed)
[place] [facility] [place] NAME: Antonio [name]    MR#:  [id number]  DATE OF BIRTH:  Mar 09, 1946  SUBJECTIVE:   Patient sitting upright in the bed. He is  awake.  Does not like thickened liquid.  Ate whole magic cup when fed by me Continues with baseline confusion.  PICC removed yday due to swelling of right arm No IV at present Vitals stable REVIEW OF SYSTEMS:   Review of Systems  Unable to perform ROS: Medical condition   Tolerating Diet:some Tolerating PT: rec SNF  DRUG ALLERGIES:  Not on File  VITALS:  Blood pressure (!) 153/49, pulse 80, temperature 98.1 F (36.7 C), temperature source Oral, resp. rate 20, height 5' 7.01" (1.702 m), weight (!) 148.3 kg, SpO2 94 %.  PHYSICAL EXAMINATION:   Physical Exam  GENERAL:  73 y.o.-year-old patient lying in the bed with no acute distress. Obese EYES: Pupils equal, round, reactive to light and accommodation. No scleral icterus.   HEENT: Head atraumatic, normocephalic. Oropharynx and nasopharynx clear. Coated tongue NECK:  Supple, no jugular venous distention. No thyroid enlargement, no tenderness.  LUNGS: Normal breath sounds bilaterally, no wheezing, rales, rhonchi. No use of accessory muscles of respiration.  CARDIOVASCULAR: S1, S2 normal. No murmurs, rubs, or gallops.  ABDOMEN: Soft, nontender, nondistended. Bowel sounds present. No organomegaly or mass. abdominal obesity+ EXTREMITIES: No cyanosis, clubbing or edema b/l.   Puffy extremities Right arm swelling + no s/o cellulitis NEUROLOGIC:moves all extremities well. No focal Motor or sensory deficits b/l.   PSYCHIATRIC:  patient is alert but judgment and insight impaired SKIN: No obvious rash, lesion, or ulcer.  LABORATORY PANEL:  CBC Recent Labs  Lab 08/16/19 2212  WBC 4.9  HGB 10.9*  HCT 34.6*  PLT 237    Chemistries  Recent Labs  Lab 08/16/19 2211 08/16/19 2211 08/17/19 0229 08/17/19 0229 08/17/19 1133  NA 147*   <  > 148*   < > 144  K 3.7   < > 4.1   < > 4.2  CL 116*   < > 116*   < > 113*  CO2 23   < > 23   < > 25  GLUCOSE 109*   < > 97   < > 107*  BUN 17   < > 17   < > 15  CREATININE 0.86   < > 0.99   < > 0.83  CALCIUM 8.1*   < > 8.1*   < > 8.2*  MG 2.3   < > 2.2  --   --   AST 57*  --   --   --   --   ALT 94*  --   --   --   --   ALKPHOS 51  --   --   --   --   BILITOT 0.7  --   --   --   --    < > = values in this interval not displayed.   Cardiac Enzymes No results for input(s): TROPONINI in the last 168 hours. RADIOLOGY:  US Venous Img Upper Uni Right(DVT)  Result Date: 08/18/2019 CLINICAL DATA:  History of PICC line placement, now with right upper extremity edema. Evaluate for DVT. EXAM: RIGHT UPPER EXTREMITY VENOUS DOPPLER ULTRASOUND TECHNIQUE: Gray-scale sonography with graded compression, as well as color Doppler and duplex ultrasound were performed to evaluate the upper extremity deep venous system from the level of the subclavian vein and including  the jugular, axillary, basilic, radial, ulnar and upper cephalic vein. Spectral Doppler was utilized to evaluate flow at rest and with distal augmentation maneuvers. COMPARISON:  Chest radiograph-08/11/2019 FINDINGS: Contralateral Subclavian Vein: Respiratory phasicity is normal and symmetric with the symptomatic side. No evidence of thrombus. Normal compressibility. Internal Jugular Vein: No evidence of thrombus. Normal compressibility, respiratory phasicity and response to augmentation. Subclavian Vein: No evidence of thrombus. Specifically, the segment of the right subclavian vein which contains a portion of the PICC line is noted to be widely patent. Normal compressibility, respiratory phasicity and response to augmentation. Axillary Vein: No evidence of thrombus. Normal compressibility, respiratory phasicity and response to augmentation. Cephalic Vein: There is mixed echogenic occlusive thrombus involving the proximal (image 13 and 14) aspects of  the cephalic vein. The cephalic vein appears patent distally (image 15). Basilic Vein: There is hypoechoic occlusive thrombus within the basilic vein surrounding the right basilic vein approach PICC line (images 21 through 28). Brachial Veins: No evidence of thrombus. Normal compressibility, respiratory phasicity and response to augmentation. Radial Veins: No evidence of thrombus. Normal compressibility, respiratory phasicity and response to augmentation. Ulnar Veins: Not well visualized Venous Reflux:  None visualized. Other Findings: Moderate amount of edema seen at the level of the right forearm and wrist. IMPRESSION: 1. No evidence of DVT within the right upper extremity. 2. Examination is positive for occlusive superficial thrombophlebitis involving the right upper extremity PICC line containing basilic vein as well as the cephalic vein. There is no extension of this occlusive SVT to the deep venous system of right upper extremity. Consideration for PICC line removal could be performed as indicated Electronically Signed   By: Sandi Mariscal M.D.   On: 08/18/2019 17:40   ASSESSMENT AND PLAN:  [name] is a 74 yo male with a history of alcohol abuse, tobacco abuse and suspected COPD who presented to the ED on 07/27/2019 after being found down by EMS. He was admitted to the ICU in acute respiratory distress, intubated and sedated. On further workup, he was found to have a grade I diastolic dysfunction, moderate aortic stenosis and AKI.  Extubated on 08/11/2019.   Acute hypoxic and hypercapnic respiratory failure. -  Resolved.  Now saturating well on room air.  -\treated for COPD exacerbation.  He was initially treated with multiple antibiotics in ICU which include Zosyn, vancomycin, ceftriaxone and Unasyn.  Now off antibiotics.  - No UDS in the system.   -Saturating well on RA.  No wheezing. -Continue DuoNeb treatment. -PT  recommending SNF placement   Severe Hypernatremia.   - Did not responded  well to desmopressin. ACTH stim test done on 08/08/2019 with baseline cortisol of 19 and appropriate response to ACTH. -Sodium improved to 144  -Serum osmolality today was 332.  Urine osmolality was 861.  These labs are more consistent with dehydration or nonrenal water loss.  Appropriate ADH response. -d/c IVF -pt is at high risk of dehyrdration given his poor po intake.  Right arm Thrombophlebitis at PICC site per [place] results -no DVT -supportive care -removed PICC -pt currently does not have IV access.  History of alcohol abuse.   -no s/o WD  Metabolic Encephalopathy.  - Most likely secondary to critical illness, infection, hypoxia and hypernatremia -Imaging and EEG was negative for any acute changes. -Supportive care.  Acute diastolic heart failure--now resolved - Echo with grade 1 diastolic heart failure. -Holding Lasix 40 mg daily, as giving him free water for hypernatremia. -Lasix can be restarted if  needed. - intake and output.  Hypertension.  Blood pressure within goal. -Continue amlodipine and metoprolol.  Nutrition--calorie count started since pt dislikes current consistency food. He had tube feeding till 08/13/2019 (ICU) -encourage to take po food--d/w son Edd Arbour   Family communication : son Coralee Pesa on the phone on 1/30 Consults :ICU, ST,PT Discharge Disposition : rehab CODE STATUS: DNR DVT Prophylaxis :enoxaparin Barriers to discharge:  Anticipate d/c Monday  TOTAL TIME TAKING CARE OF THIS PATIENT: *25* minutes.  >50% time spent on counselling and coordination of care  Note: This dictation was prepared with Dragon dictation along with smaller phrase technology. Any transcriptional errors that result from this process are unintentional.  Fritzi Mandes M.D    [facility]   CC: Primary care physician; Patient, No Pcp PerPatient ID: Antonio [name], male   DOB: [birth year], 74 y.o.   MRN: [id number]

## 2019-08-20 DIAGNOSIS — I5031 Acute diastolic (congestive) heart failure: Secondary | ICD-10-CM

## 2019-08-20 LAB — RESPIRATORY PANEL BY RT PCR (FLU A&B, COVID)
Influenza A by PCR: NEGATIVE
Influenza B by PCR: NEGATIVE
SARS Coronavirus 2 by RT PCR: NEGATIVE

## 2019-08-20 LAB — GLUCOSE, CAPILLARY
Glucose-Capillary: 120 mg/dL — ABNORMAL HIGH (ref 70–99)
Glucose-Capillary: 103 mg/dL — ABNORMAL HIGH (ref 70–99)

## 2019-08-20 MED ORDER — MIRTAZAPINE 15 MG PO TABS: 15.0000 mg | ORAL_TABLET | Freq: Every day | ORAL | 0 refills | Status: DC

## 2019-08-20 MED ORDER — AMLODIPINE BESYLATE 10 MG PO TABS: 10.0000 mg | ORAL_TABLET | Freq: Every day | ORAL | 0 refills | Status: DC

## 2019-08-20 MED ORDER — ENSURE ENLIVE PO LIQD: 237.0000 mL | Freq: Three times a day (TID) | ORAL | 12 refills | Status: DC

## 2019-08-20 MED ORDER — ENSURE ENLIVE PO LIQD
237.0000 mL | Freq: Three times a day (TID) | ORAL | Status: DC
  Administered 2019-08-20: 237 mL via ORAL

## 2019-08-20 MED ORDER — FAMOTIDINE 20 MG PO TABS: 20.0000 mg | ORAL_TABLET | Freq: Every day | ORAL | 0 refills | Status: DC

## 2019-08-20 MED ORDER — MULTI-VITAMIN/MINERALS PO TABS: 1.0000 | ORAL_TABLET | Freq: Every day | ORAL | 2 refills | Status: DC

## 2019-08-20 MED ORDER — NEPRO/CARBSTEADY PO LIQD: 237.0000 mL | Freq: Three times a day (TID) | ORAL | 0 refills | Status: DC

## 2019-08-20 NOTE — Plan of Care (Signed)
The patient has been discharged to [facility]. No Ivs. Report provided to SNF. Transferred via EMS. Problem: Education: Goal: Knowledge of General Education information will improve Description: Including pain rating scale, medication(s)/side effects and non-pharmacologic comfort measures Outcome: Completed/Met   Problem: Health Behavior/Discharge Planning: Goal: Ability to manage health-related needs will improve Outcome: Completed/Met   Problem: Clinical Measurements: Goal: Ability to maintain clinical measurements within normal limits will improve Outcome: Completed/Met Goal: Will remain free from infection Outcome: Completed/Met Goal: Diagnostic test results will improve Outcome: Completed/Met Goal: Respiratory complications will improve Outcome: Completed/Met Goal: Cardiovascular complication will be avoided Outcome: Completed/Met   Problem: Nutrition: Goal: Adequate nutrition will be maintained Outcome: Completed/Met   Problem: Coping: Goal: Level of anxiety will decrease Outcome: Completed/Met   Problem: Elimination: Goal: Will not experience complications related to bowel motility Outcome: Completed/Met Goal: Will not experience complications related to urinary retention Outcome: Completed/Met   Problem: Inadequate Intake (NI-2.1) Goal: Food and/or nutrient delivery Description: Individualized approach for food/nutrient provision. Outcome: Completed/Met   Problem: SLP Dysphagia Goals Goal: Misc Dysphagia Goal Description: Pt will tolerate least restrictive diet considered safe without s/s of aspiration with 100% Outcome: Completed/Met

## 2019-08-20 NOTE — Progress Notes (Signed)
72 Hour Calorie Count  Estimated Nutritional Needs:  Kcal:  2200-2400 Protein:  110-120 grams Fluid:  1.7-2 L/day  Lunch 1/29: 50% puree pork, 50% broccoli, 50% pureed mac & cheese  Total- 200kcal, 12g protein  Dinner 1/29: 0% of meal  Total- 200kcal, 12g protein   Breakfast 1/30: 0% of meal  Lunch 1/30: 50% pureed carrots, 50% pureed [place], 50% mashed potatoes, 100% ice tea  Total- 280kcal, 12g protein   Dinner 1/30: 20% pureed beef, 25% pureed cream of potato soup, 50% pureed peaches, 50% greek yogurt, 50% nectar thick sweet tea  Total- 183kcal, 12g protein  Breakfast 1/31: 4 spoonfuls Magic Cup, 2% of total meal  Total- 20kcal, 1g protein  Lunch 1/31: 100% Magic Cup- refused all other food  Total- 290kcal, 9g protein   Dinner 1/31: few bites mashed potatoes, 100% pureed peaches, 100% tea  Total- 150kcal, 0g protein  Breakfast 2/1: 100% Magic Cup  Total- 290kcal, 9g protein   Supplements: pt refusing Nepro supplements  Total Intake:    Day 1: 680kcal (30% estimated needs), 36g protein (33% estimated needs)  Day 2: 483kcal (22% estimated needs), 25g protein (23% estimated needs)  Day 3: 730kcal (33% estimated needs), 18g protein (16% estimated needs)  Koleen Distance MS, RD, LDN Pager #- [phone] Office#- [phone] After Hours Pager: [phone]

## 2019-08-20 NOTE — Discharge Summary (Addendum)
[facility] NAME: Antonio [name]    MR#:  [id number]  DATE OF BIRTH:  1945-07-22  DATE OF ADMISSION:  07/27/2019 ADMITTING PHYSICIAN: No admitting provider for patient encounter.  DATE OF DISCHARGE: 08/20/2019  PRIMARY CARE PHYSICIAN: Patient, No Pcp Per    ADMISSION DIAGNOSIS:  Respiratory failure (HCC) [J96.90] Acute respiratory failure with hypoxia and hypercapnia (HCC) [J96.01, J96.02]  DISCHARGE DIAGNOSIS:  Acute hypoxic/hypercapneic Respiratory failure secondary to COPD and CHF exacerbation Severe hypernatremia-- corrected Right upper extremity thrombophlebitis at PICC site acute metabolic encephalopathy -multifactorial acute diastolic heart failure hypertension SECONDARY DIAGNOSIS:   Past Medical History:  Diagnosis Date  . ETOH abuse   . Smoker     HOSPITAL COURSE:  Antonio [name] is a 74 yo male with a history of alcohol abuse, tobacco abuse and suspected COPD who presented to the ED on 07/27/2019 after being found down by EMS. He was admitted to the ICU in acute respiratory distress, intubated and sedated. On further workup, he was found to have a grade I diastolic dysfunction, moderate aortic stenosis and AKI.  Extubated on 08/11/2019.   Acute hypoxic and hypercapnic respiratory failure due to COPD and CHF (new) - Resolved. Now saturating well on room air. -treated for COPD /CHF exacerbation. He was initially treated with multiple antibiotics in ICU which include Zosyn, vancomycin, ceftriaxone and Unasyn. Now off antibiotics.  -No UDS in the system.  -Saturating well on RA. No wheezing. -Continue DuoNeb treatment. -PT  recommending SNF placement   Severe Hypernatremia. - Did not responded well to desmopressin. ACTH stim test done on 08/08/2019 with baseline cortisol of 19 and appropriate response to ACTH. -Sodium improved to 144  -Serum osmolality today was 332. Urine osmolality was 861.These labs  are more consistent with dehydration or nonrenal water loss. Appropriate ADH response. -d/c IVF -pt is at high risk of dehyrdration given his poor po intake.  Right arm Thrombophlebitis at PICC site per [place] results -no DVT -supportive care -removed PICC -pt currently does not have IV access.  History of alcohol abuse. -no s/o WD  Metabolic Encephalopathy. -Most likely secondary to critical illness, infection, hypoxia, ?alcoholic dementia and hypernatremia -Per Neurology--he may not return to his previous baseline -Imaging and EEG was negative for any acute changes. -Supportive care.  Acute diastolic heart failure--now resolved -Echo with grade 1 diastolic heart failure. -HoldingLasix 40 mg daily,as giving him free water for hypernatremia. -Lasix can be restarted if needed. -appears well compensated clinically. Once mobilizes at Rehab hoping puffiness of extremities will improve  Hypertension. Blood pressure within goal. -Continue amlodipine and metoprolol.  Nutrition--calorie count started since pt dislikes current consistency food. He had tube feeding till 08/13/2019 (ICU) -encourage to take po food--d/w son Antonio [name]  -dietitian and ST  to follow at the facility -ST rec for 08/20/2019:Dysphagia level 2(Minced foods w/ gravies to moisten); Thin liquids VIA CUP. Aspiration precautions; Pills in PUREE; Tray Setup at meals  Pt best at baseline. D/c to [facility] communication : son Antonio [name] on the phone on 1/30 Consults :ICU, ST,PT Discharge Disposition : rehab--[facility] CODE STATUS: DNR DVT Prophylaxis :enoxaparin Barriers to discharge:  none  CONSULTS OBTAINED:  Treatment Team:  Catarina Hartshorn, MD Enzo Bi, MD  DRUG ALLERGIES:  Not on File  DISCHARGE MEDICATIONS:   Allergies as of 08/20/2019   Not on File     Medication List    TAKE these medications   amLODipine 10 MG  tablet Commonly known as: NORVASC Take 1 tablet (10 mg total) by  mouth daily.   famotidine 20 MG tablet Commonly known as: PEPCID Take 1 tablet (20 mg total) by mouth daily.   feeding supplement (NEPRO CARB STEADY) Liqd Take 237 mLs by mouth 3 (three) times daily between meals.   feeding supplement (ENSURE ENLIVE) Liqd Take 237 mLs by mouth 3 (three) times daily between meals.   mirtazapine 15 MG tablet Commonly known as: REMERON Take 1 tablet (15 mg total) by mouth at bedtime.   multivitamin with minerals tablet Take 1 tablet by mouth daily.       If you experience worsening of your admission symptoms, develop shortness of breath, life threatening emergency, suicidal or homicidal thoughts you must seek medical attention immediately by calling 911 or calling your MD immediately  if symptoms less severe.  You Must read complete instructions/literature along with all the possible adverse reactions/side effects for all the Medicines you take and that have been prescribed to you. Take any new Medicines after you have completely understood and accept all the possible adverse reactions/side effects.   Please note  You were cared for by a hospitalist during your hospital stay. If you have any questions about your discharge medications or the care you received while you were in the hospital after you are discharged, you can call the unit and asked to speak with the hospitalist on call if the hospitalist that took care of you is not available. Once you are discharged, your primary care physician will handle any further medical issues. Please note that NO REFILLS for any discharge medications will be authorized once you are discharged, as it is imperative that you return to your primary care physician (or establish a relationship with a primary care physician if you do not have one) for your aftercare needs so that they can reassess your need for medications and monitor your lab values. Today   SUBJECTIVE  I want some water   VITAL SIGNS:  Blood pressure  (!) 113/48, pulse 96, temperature 98.3 F (36.8 C), temperature source Oral, resp. rate 18, height 5' 7.01" (1.702 m), weight (!) 148.3 kg, SpO2 94 %.  I/O:    Intake/Output Summary (Last 24 hours) at 08/20/2019 1609 Last data filed at 08/20/2019 0655 Gross per 24 hour  Intake 540 ml  Output 750 ml  Net -210 ml    PHYSICAL EXAMINATION:   GENERAL:  74 y.o.-year-old patient lying in the bed with no acute distress. Obese EYES: Pupils equal, round, reactive to light and accommodation. No scleral icterus.   HEENT: Head atraumatic, normocephalic. Oropharynx and nasopharynx clear. Coated tongue NECK:  Supple, no jugular venous distention. No thyroid enlargement, no tenderness.  LUNGS: Normal breath sounds bilaterally, no wheezing, rales, rhonchi. No use of accessory muscles of respiration.  CARDIOVASCULAR: S1, S2 normal. No murmurs, rubs, or gallops.  ABDOMEN: Soft, nontender, nondistended. Bowel sounds present. No organomegaly or mass. abdominal obesity+ EXTREMITIES: No cyanosis, clubbing or edema b/l.   Puffy extremities Right arm swelling + no s/o cellulitis NEUROLOGIC:moves all extremities well. No focal Motor or sensory deficits b/l.   PSYCHIATRIC:  patient is alert but judgment and insight impaired SKIN: No obvious rash, lesion, or ulcer.  DATA REVIEW:   CBC  Recent Labs  Lab 08/16/19 2212  WBC 4.9  HGB 10.9*  HCT 34.6*  PLT 237    Chemistries  Recent Labs  Lab 08/16/19 2211 08/16/19 2211 08/17/19 0229 08/17/19 0229 08/17/19 1133  NA 147*   < > 148*   < > 144  K 3.7   < > 4.1   < > 4.2  CL 116*   < > 116*   < > 113*  CO2 23   < > 23   < > 25  GLUCOSE 109*   < > 97   < > 107*  BUN 17   < > 17   < > 15  CREATININE 0.86   < > 0.99   < > 0.83  CALCIUM 8.1*   < > 8.1*   < > 8.2*  MG 2.3   < > 2.2  --   --   AST 57*  --   --   --   --   ALT 94*  --   --   --   --   ALKPHOS 51  --   --   --   --   BILITOT 0.7  --   --   --   --    < > = values in this interval not  displayed.    Microbiology Results   Recent Results (from the past 240 hour(s))  Respiratory Panel by RT PCR (Flu A&B, Covid) - Nasopharyngeal Swab     Status: None   Collection Time: 08/20/19 11:26 AM   Specimen: Nasopharyngeal Swab  Result Value Ref Range Status   SARS Coronavirus 2 by RT PCR NEGATIVE NEGATIVE Final    Comment: (NOTE) SARS-CoV-2 target nucleic acids are NOT DETECTED. The SARS-CoV-2 RNA is generally detectable in upper respiratoy specimens during the acute phase of infection. The lowest concentration of SARS-CoV-2 viral copies this assay can detect is 131 copies/mL. A negative result does not preclude SARS-Cov-2 infection and should not be used as the sole basis for treatment or other patient management decisions. A negative result may occur with  improper specimen collection/handling, submission of specimen other than nasopharyngeal swab, presence of viral mutation(s) within the areas targeted by this assay, and inadequate number of viral copies (<131 copies/mL). A negative result must be combined with clinical observations, patient history, and epidemiological information. The expected result is Negative. Fact Sheet for Patients:  PinkCheek.be Fact Sheet for Healthcare Providers:  GravelBags.it This test is not yet ap proved or cleared by the [place] FDA and  has been authorized for detection and/or diagnosis of SARS-CoV-2 by FDA under an Emergency Use Authorization (EUA). This EUA will remain  in effect (meaning this test can be used) for the duration of the COVID-19 declaration under Section 564(b)(1) of the Act, 21 U.S.C. section 360bbb-3(b)(1), unless the authorization is terminated or revoked sooner.    Influenza A by PCR NEGATIVE NEGATIVE Final   Influenza B by PCR NEGATIVE NEGATIVE Final    Comment: (NOTE) The Xpert Xpress SARS-CoV-2/FLU/RSV assay is intended as an aid in  the diagnosis  of influenza from Nasopharyngeal swab specimens and  should not be used as a sole basis for treatment. Nasal washings and  aspirates are unacceptable for Xpert Xpress SARS-CoV-2/FLU/RSV  testing. Fact Sheet for Patients: PinkCheek.be Fact Sheet for Healthcare Providers: GravelBags.it This test is not yet approved or cleared by the [place] FDA and  has been authorized for detection and/or diagnosis of SARS-CoV-2 by  FDA under an Emergency Use Authorization (EUA). This EUA will remain  in effect (meaning this test can be used) for the duration of the  Covid-19 declaration under Section 564(b)(1) of the Act, 21  U.S.C. section  360bbb-3(b)(1), unless the authorization is  terminated or revoked. Performed at [facility], [address] [place], [place] [address]     RADIOLOGY:  US Venous Img Upper Uni Right(DVT)  Result Date: 08/18/2019 CLINICAL DATA:  History of PICC line placement, now with right upper extremity edema. Evaluate for DVT. EXAM: RIGHT UPPER EXTREMITY VENOUS DOPPLER ULTRASOUND TECHNIQUE: Gray-scale sonography with graded compression, as well as color Doppler and duplex ultrasound were performed to evaluate the upper extremity deep venous system from the level of the subclavian vein and including the jugular, axillary, basilic, radial, ulnar and upper cephalic vein. Spectral Doppler was utilized to evaluate flow at rest and with distal augmentation maneuvers. COMPARISON:  Chest radiograph-08/11/2019 FINDINGS: Contralateral Subclavian Vein: Respiratory phasicity is normal and symmetric with the symptomatic side. No evidence of thrombus. Normal compressibility. Internal Jugular Vein: No evidence of thrombus. Normal compressibility, respiratory phasicity and response to augmentation. Subclavian Vein: No evidence of thrombus. Specifically, the segment of the right subclavian vein which contains a portion of the  PICC line is noted to be widely patent. Normal compressibility, respiratory phasicity and response to augmentation. Axillary Vein: No evidence of thrombus. Normal compressibility, respiratory phasicity and response to augmentation. Cephalic Vein: There is mixed echogenic occlusive thrombus involving the proximal (image 13 and 14) aspects of the cephalic vein. The cephalic vein appears patent distally (image 15). Basilic Vein: There is hypoechoic occlusive thrombus within the basilic vein surrounding the right basilic vein approach PICC line (images 21 through 28). Brachial Veins: No evidence of thrombus. Normal compressibility, respiratory phasicity and response to augmentation. Radial Veins: No evidence of thrombus. Normal compressibility, respiratory phasicity and response to augmentation. Ulnar Veins: Not well visualized Venous Reflux:  None visualized. Other Findings: Moderate amount of edema seen at the level of the right forearm and wrist. IMPRESSION: 1. No evidence of DVT within the right upper extremity. 2. Examination is positive for occlusive superficial thrombophlebitis involving the right upper extremity PICC line containing basilic vein as well as the cephalic vein. There is no extension of this occlusive SVT to the deep venous system of right upper extremity. Consideration for PICC line removal could be performed as indicated Electronically Signed   By: Sandi Mariscal M.D.   On: 08/18/2019 17:40     CODE STATUS:     Code Status Orders  (From admission, onward)         Start     Ordered   08/01/19 1959  Do not attempt resuscitation (DNR)  Continuous    Question Answer Comment  In the event of cardiac or respiratory ARREST Do not call a "code blue"   In the event of cardiac or respiratory ARREST Do not perform Intubation, CPR, defibrillation or ACLS   In the event of cardiac or respiratory ARREST Use medication by any route, position, wound care, and other measures to relive pain and  suffering. May use oxygen, suction and manual treatment of airway obstruction as needed for comfort.      08/01/19 1959        Code Status History    Date Active Date Inactive Code Status Order ID Comments User Context   07/28/2019 0042 08/01/2019 1959 Full Code [id number]  Flora Lipps, MD ED   Advance Care Planning Activity       TOTAL TIME TAKING CARE OF THIS PATIENT: *40** minutes.    Fritzi Mandes M.D  [facility]    CC: Primary care physician; Patient, No Pcp Per

## 2019-08-20 NOTE — Progress Notes (Signed)
Patient restless, trying to get out bed and go home. Patient yelling out to"  call Mortimer Fries right now". Mortimer Fries was called and he did speak with patient, patient telling him to come and pick him up. Patient not easily redirected.  Haldol given as ordered. Terrial Rhodes

## 2019-08-20 NOTE — Progress Notes (Signed)
Patient resting quietly , eyes closed. Antonio [name]

## 2019-08-20 NOTE — TOC Transition Note (Signed)
Transition of Care [facility]) - CM/SW Discharge Note   Patient Details  Name: Antonio [name] MRN: [id number] Date of Birth: 1945/08/22  [facility] CM/SW Contact:  Beverly Sessions, RN Phone Number: 08/20/2019, 2:12 PM   Clinical Narrative:    Patient to discharge to [facility] place today Son notified DC info sent in the [facility]  EMS packet and DNR on chart.  Bedside RN notified   Final next level of care: Skilled Nursing Facility Barriers to Discharge: No Barriers Identified   Patient Goals and CMS Choice        Discharge Placement              Patient [facility] bed at: [facility] Patient to be transferred to facility by: EMS Name of family member notified: son Patient and family notified of of transfer: 08/20/19  Discharge Plan and Services                                     Social Determinants of Health (SDOH) Interventions     Readmission Risk Interventions No flowsheet data found.

## 2019-08-20 NOTE — Progress Notes (Signed)
Nutrition Follow Up Note   DOCUMENTATION CODES:   Obesity unspecified  INTERVENTION:   Ensure Enlive po TID, each supplement provides 350 kcal and 20 grams of protein  Magic cup TID with meals, each supplement provides 290 kcal and 9 grams of protein  Vital Cuisine TID, each supplement provides 520kcal and 22g of protein.   Recommend MVI daily   NUTRITION DIAGNOSIS:   Inadequate oral intake related to inability to eat as evidenced by NPO status. -pt initiated on diet but continues to have poor po  GOAL:   Patient will meet greater than or equal to 90% of their needs -progressing   MONITOR:   PO intake, Supplement acceptance, Diet advancement, Labs, Weight trends, I & O's  ASSESSMENT:   74 yo male with a history of alcohol abuse, tobacco abuse and suspected COPD who presented to the ED on 07/27/2019 after being found down by EMS.  He was admitted to the ICU in acute respiratory distress, intubated and sedated.  On further workup, he was found to have a grade I diastolic dysfunction, moderate aortic stenosis and AKI.  Extubated on 08/11/2019.   72 hour calorie count completed; pt meeting 22-33% of his estimated calorie needs and 16- 33% of his estimated protein needs. Pt seen by SLP today and upgraded to a dysphagia 2/thin liquid diet. RD will change Nepro to Ensure and add Vital Cuisine to meal trays. Pt to discharge today. Per chart, pt down ~20lbs since admit.   Medications reviewed and include: lovenox, pepcid, remeron, KCl, thiamine   Labs reviewed: cbgs 93, 108, 120, 123, 120 x 24 hrs  Diet Order:   Diet Order            DIET DYS 2 Room service appropriate? Yes with Assist; Fluid consistency: Thin  Diet effective now        Diet - low sodium heart healthy             EDUCATION NEEDS:   No education needs have been identified at this time  Skin:  Skin Assessment: Skin Integrity Issues:(MSAD to groin)  Last BM:  1/31- type 5  Height:   Ht Readings from  Last 1 Encounters:  08/08/19 5' 7.01" (1.702 m)   Weight:   Wt Readings from Last 1 Encounters:  08/18/19 (!) 148.3 kg   Ideal Body Weight:  67.3 kg  BMI:  Body mass index is 51.2 kg/m.  Estimated Nutritional Needs:   Kcal:  2200-2400  Protein:  110-120 grams  Fluid:  1.7-2 L/day  Koleen Distance MS, RD, LDN Pager #- [phone] Office#- [phone] After Hours Pager: [phone]

## 2019-08-20 NOTE — Discharge Instructions (Addendum)
Speech therapy and Dietitian to follow at the facility

## 2019-08-20 NOTE — Progress Notes (Signed)
Subjective: Patient has continued to improve.  Awake and alert.  Follows commands.    Objective: Current vital signs: BP (!) 127/59 (BP Location: Left Wrist)   Pulse 89   Temp 98.5 F (36.9 C) (Oral)   Resp 18   Ht 5' 7.01" (1.702 m)   Wt (!) 148.3 kg   SpO2 97%   BMI 51.20 kg/m  Vital signs in last 24 hours: Temp:  [98.2 F (36.8 C)-98.5 F (36.9 C)] 98.5 F (36.9 C) (02/01 2706) Pulse Rate:  [83-108] 89 (02/01 0611) Resp:  [18] 18 (02/01 0611) BP: (114-137)/(53-70) 127/59 (02/01 0611) SpO2:  [95 %-100 %] 97 % (02/01 0735)  Intake/Output from previous day: 01/31 0701 - 02/01 0700 In: 908 [P.O.:908] Out: 750 [Urine:750] Intake/Output this shift: No intake/output data recorded. Nutritional status:  Diet Order            Diet - low sodium heart healthy        DIET - DYS 1 Room service appropriate? Yes with Assist; Fluid consistency: Nectar Thick  Diet effective now              Neurologic Exam: Mental Status: Alert, oriented to name and President/VP.  Will not answer questions otherwise related to orientation and avoids the questions.  Speech fluent without evidence of aphasia.  Able to follow commands without difficulty. Cranial Nerves: II: Visual fields grossly normal, pupils equal, round, reactive to light and accommodation III,IV, VI: Extra-ocular motions intact bilaterally V,VII: smile symmetric, facial light touch sensation normal bilaterally VIII: hearing normal bilaterally IX,X: gag reflex present XI: bilateral shoulder shrug XII: midline tongue extension Motor: 5/5 throughout Sensory: Pinprick and light touch intact throughout, bilaterally   Lab Results: Basic Metabolic Panel: Recent Labs  Lab 08/13/19 1117 08/13/19 1800 08/14/19 0500 08/14/19 0500 08/15/19 0613 08/15/19 2376 08/16/19 0753 08/16/19 0753 08/16/19 2211 08/17/19 0229 08/17/19 1133  NA 155*   < > 153*   < > 155*  --  149*  --  147* 148* 144  K  --   --  3.2*   < > 3.3*  --   3.5  --  3.7 4.1 4.2  CL  --   --  118*   < > 119*  --  117*  --  116* 116* 113*  CO2  --   --  30   < > 28  --  25  --  _0 GLUCOSE  --   --  128*   < > 117*  --  115*  --  109* 97 107*  BUN  --   --  41*   < > 33*  --  21  --  _1 CREATININE  --   --  0.96   < > 0.93  --  0.88  --  0.86 0.99 0.83  CALCIUM  --   --  8.4*   < > 8.4*   < > 8.2*   < > 8.1* 8.1* 8.2*  MG  --   --  2.8*  --  2.4  --   --   --  2.3 2.2  --   PHOS 3.5  --  3.7  --   --   --   --   --   --   --  3.2  3.3   < > = values in this interval not displayed.    Liver Function Tests: Recent Labs  Lab 08/14/19 0500 08/16/19  2211 08/17/19 1133  AST  --  57*  --   ALT  --  94*  --   ALKPHOS  --  51  --   BILITOT  --  0.7  --   PROT  --  5.7*  --   ALBUMIN 2.7* 2.5* 2.7*   No results for input(s): LIPASE, AMYLASE in the last 168 hours. Recent Labs  Lab 08/14/19 1109 08/16/19 2211  AMMONIA 15 15    CBC: Recent Labs  Lab 08/14/19 0500 08/15/19 0613 08/16/19 2212  WBC 7.7 7.0 4.9  HGB 12.0* 11.9* 10.9*  HCT 39.7 37.9* 34.6*  MCV 103.7* 101.3* 100.0  PLT 281 263 237    Cardiac Enzymes: No results for input(s): CKTOTAL, CKMB, CKMBINDEX, TROPONINI in the last 168 hours.  Lipid Panel: Recent Labs  Lab 08/17/19 1133  CHOL 131  TRIG 106  HDL 21*  CHOLHDL 6.2  VLDL 21  LDLCALC 89    CBG: Recent Labs  Lab 08/19/19 0805 08/19/19 1223 08/19/19 1654 08/19/19 2137 08/20/19 0728  GLUCAP 93 108* 120* 123* 120*    Microbiology: Results for orders placed or performed during the hospital encounter of 07/27/19  Blood Culture (routine x 2)     Status: None   Collection Time: 07/27/19 10:45 PM   Specimen: BLOOD  Result Value Ref Range Status   Specimen Description BLOOD Blood Culture adequate volume  Final   Special Requests   Final    BOTTLES DRAWN AEROBIC AND ANAEROBIC LEFT ANTECUBITAL   Culture   Final    NO GROWTH 5 DAYS Performed at [facility], [address] [place], [place] [address]    Report Status 08/01/2019 FINAL  Final  Respiratory Panel by RT PCR (Flu A&B, Covid) - Nasopharyngeal Swab     Status: None   Collection Time: 07/27/19 10:45 PM   Specimen: Nasopharyngeal Swab  Result Value Ref Range Status   SARS Coronavirus 2 by RT PCR NEGATIVE NEGATIVE Final    Comment: (NOTE) SARS-CoV-2 target nucleic acids are NOT DETECTED. The SARS-CoV-2 RNA is generally detectable in upper respiratoy specimens during the acute phase of infection. The lowest concentration of SARS-CoV-2 viral copies this assay can detect is 131 copies/mL. A negative result does not preclude SARS-Cov-2 infection and should not be used as the sole basis for treatment or other patient management decisions. A negative result may occur with  improper specimen collection/handling, submission of specimen other than nasopharyngeal swab, presence of viral mutation(s) within the areas targeted by this assay, and inadequate number of viral copies (<131 copies/mL). A negative result must be combined with clinical observations, patient history, and epidemiological information. The expected result is Negative. Fact Sheet for Patients:  PinkCheek.be Fact Sheet for Healthcare Providers:  GravelBags.it This test is not yet ap proved or cleared by the [place] FDA and  has been authorized for detection and/or diagnosis of SARS-CoV-2 by FDA under an Emergency Use Authorization (EUA). This EUA will remain  in effect (meaning this test can be used) for the duration of the COVID-19 declaration under Section 564(b)(1) of the Act, 21 U.S.C. section 360bbb-3(b)(1), unless the authorization is terminated or revoked sooner.    Influenza A by PCR NEGATIVE NEGATIVE Final   Influenza B by PCR NEGATIVE NEGATIVE Final    Comment: (NOTE) The Xpert Xpress SARS-CoV-2/FLU/RSV assay is intended as an aid in  the diagnosis of influenza  from Nasopharyngeal swab specimens and  should not be used as a  sole basis for treatment. Nasal washings and  aspirates are unacceptable for Xpert Xpress SARS-CoV-2/FLU/RSV  testing. Fact Sheet for Patients: PinkCheek.be Fact Sheet for Healthcare Providers: GravelBags.it This test is not yet approved or cleared by the [place] FDA and  has been authorized for detection and/or diagnosis of SARS-CoV-2 by  FDA under an Emergency Use Authorization (EUA). This EUA will remain  in effect (meaning this test can be used) for the duration of the  Covid-19 declaration under Section 564(b)(1) of the Act, 21  U.S.C. section 360bbb-3(b)(1), unless the authorization is  terminated or revoked. Performed at [facility], [address] [place], [place] [address]   Urine Culture     Status: None   Collection Time: 07/27/19 10:45 PM   Specimen: Urine, Random  Result Value Ref Range Status   Specimen Description   Final    URINE, RANDOM Performed at [facility], [address] [place], [place] [address]    Special Requests   Final    NONE Performed at [facility], [address] [place], [place] [address]    Culture   Final    NO GROWTH Performed at [facility], [place] [address], [place], [place] [address]    Report Status 07/29/2019 FINAL  Final  MRSA PCR Screening     Status: None   Collection Time: 07/28/19 11:07 AM   Specimen: Nasal Mucosa; Nasopharyngeal  Result Value Ref Range Status   MRSA by PCR NEGATIVE NEGATIVE Final    Comment:        The GeneXpert MRSA Assay (FDA approved for NASAL specimens only), is one component of a comprehensive MRSA colonization surveillance program. It is not intended to diagnose MRSA infection nor to guide or monitor treatment for MRSA infections. Performed at [facility], [address], [place], [place] [address]   Urine Culture      Status: None   Collection Time: 07/28/19  5:15 PM   Specimen: Urine, Random  Result Value Ref Range Status   Specimen Description   Final    URINE, RANDOM Performed at [facility], [address] [place], [place] [address]    Special Requests   Final    NONE Performed at [facility], [address] [place], [place] [address]    Culture   Final    NO GROWTH Performed at [facility], [place] [address] [place], [place] [address]    Report Status 07/30/2019 FINAL  Final  Culture, respiratory     Status: None   Collection Time: 07/30/19 11:50 AM   Specimen: SPU  Result Value Ref Range Status   Specimen Description   Final    SPUTUM Performed at [facility], [address] [place], [place] [address]    Special Requests   Final    NONE Performed at [facility], [place], [place] [address]    Gram Stain   Final    ABUNDANT WBC PRESENT,BOTH PMN AND MONONUCLEAR RARE GRAM POSITIVE COCCI RARE GRAM VARIABLE ROD    Culture   Final    RARE Consistent with normal respiratory flora. Performed at [facility], [place] [address], [place],  [address]    Report Status 08/02/2019 FINAL  Final  CULTURE, BLOOD (ROUTINE X 2) w Reflex to ID Panel     Status: None   Collection Time: 08/08/19  9:27 AM   Specimen: BLOOD  Result Value  Ref Range Status   Specimen Description BLOOD RIGHT FOOT  Final   Special Requests   Final    BOTTLES DRAWN AEROBIC AND ANAEROBIC Blood Culture adequate volume   Culture   Final    NO GROWTH 5 DAYS Performed at [facility], [address] [place], [place] [address]    Report Status 08/13/2019 FINAL  Final  CULTURE, BLOOD (ROUTINE X 2) w Reflex to ID Panel     Status: None   Collection Time: 08/08/19  9:27 AM   Specimen: BLOOD  Result Value Ref Range Status   Specimen Description BLOOD LEFT FOOT  Final   Special Requests   Final    BOTTLES DRAWN AEROBIC AND ANAEROBIC Blood  Culture results may not be optimal due to an excessive volume of blood received in culture bottles   Culture   Final    NO GROWTH 5 DAYS Performed at [facility], [address] [place], [place] [address]    Report Status 08/13/2019 FINAL  Final  MRSA PCR Screening     Status: None   Collection Time: 08/08/19  9:43 AM   Specimen: Nasopharyngeal  Result Value Ref Range Status   MRSA by PCR NEGATIVE NEGATIVE Final    Comment:        The GeneXpert MRSA Assay (FDA approved for NASAL specimens only), is one component of a comprehensive MRSA colonization surveillance program. It is not intended to diagnose MRSA infection nor to guide or monitor treatment for MRSA infections. Performed at [facility], [address] [place], [place] [address]   Culture, respiratory (non-expectorated)     Status: None   Collection Time: 08/08/19  5:01 PM   Specimen: Tracheal Aspirate; Respiratory  Result Value Ref Range Status   Specimen Description   Final    TRACHEAL ASPIRATE Performed at [facility], [address], [place], [place] [address]    Special Requests   Final    NONE Performed at [facility], [address] [place], [place] [address]    Gram Stain   Final    RARE WBC PRESENT, PREDOMINANTLY PMN RARE GRAM POSITIVE COCCI IN PAIRS Performed at [facility], [place] [address] [place], [place] [address]    Culture RARE STAPHYLOCOCCUS AUREUS  Final   Report Status 08/12/2019 FINAL  Final   Organism ID, Bacteria STAPHYLOCOCCUS AUREUS  Final      Susceptibility   Staphylococcus aureus - MIC*    CIPROFLOXACIN <=0.5 SENSITIVE Sensitive     ERYTHROMYCIN <=0.25 SENSITIVE Sensitive     GENTAMICIN <=0.5 SENSITIVE Sensitive     OXACILLIN 0.5 SENSITIVE Sensitive     TETRACYCLINE <=1 SENSITIVE Sensitive     VANCOMYCIN 1 SENSITIVE Sensitive     TRIMETH/SULFA <=10 SENSITIVE Sensitive     CLINDAMYCIN <=0.25 SENSITIVE Sensitive     RIFAMPIN <=0.5  SENSITIVE Sensitive     Inducible Clindamycin NEGATIVE Sensitive     * RARE STAPHYLOCOCCUS AUREUS  Culture, respiratory (non-expectorated)     Status: None   Collection Time: 08/10/19 10:04 AM   Specimen: Bronchoalveolar Lavage; Respiratory  Result Value Ref Range Status   Specimen Description   Final    BRONCHIAL ALVEOLAR LAVAGE Performed at [facility], [address] [place], [place] [address]    Special Requests   Final    NONE Performed at [facility], [place],  [address]    Gram Stain NO WBC SEEN NO  ORGANISMS SEEN   Final   Culture   Final    RARE Consistent with normal respiratory flora. Performed at [facility], [place] [address] [place], [place] [address]    Report Status 08/12/2019 FINAL  Final    Coagulation Studies: No results for input(s): LABPROT, INR in the last 72 hours.  Imaging: US Venous Img Upper Uni Right(DVT)  Result Date: 08/18/2019 CLINICAL DATA:  History of PICC line placement, now with right upper extremity edema. Evaluate for DVT. EXAM: RIGHT UPPER EXTREMITY VENOUS DOPPLER ULTRASOUND TECHNIQUE: Gray-scale sonography with graded compression, as well as color Doppler and duplex ultrasound were performed to evaluate the upper extremity deep venous system from the level of the subclavian vein and including the jugular, axillary, basilic, radial, ulnar and upper cephalic vein. Spectral Doppler was utilized to evaluate flow at rest and with distal augmentation maneuvers. COMPARISON:  Chest radiograph-08/11/2019 FINDINGS: Contralateral Subclavian Vein: Respiratory phasicity is normal and symmetric with the symptomatic side. No evidence of thrombus. Normal compressibility. Internal Jugular Vein: No evidence of thrombus. Normal compressibility, respiratory phasicity and response to augmentation. Subclavian Vein: No evidence of thrombus. Specifically, the segment of the right subclavian vein which contains a portion of  the PICC line is noted to be widely patent. Normal compressibility, respiratory phasicity and response to augmentation. Axillary Vein: No evidence of thrombus. Normal compressibility, respiratory phasicity and response to augmentation. Cephalic Vein: There is mixed echogenic occlusive thrombus involving the proximal (image 13 and 14) aspects of the cephalic vein. The cephalic vein appears patent distally (image 15). Basilic Vein: There is hypoechoic occlusive thrombus within the basilic vein surrounding the right basilic vein approach PICC line (images 21 through 28). Brachial Veins: No evidence of thrombus. Normal compressibility, respiratory phasicity and response to augmentation. Radial Veins: No evidence of thrombus. Normal compressibility, respiratory phasicity and response to augmentation. Ulnar Veins: Not well visualized Venous Reflux:  None visualized. Other Findings: Moderate amount of edema seen at the level of the right forearm and wrist. IMPRESSION: 1. No evidence of DVT within the right upper extremity. 2. Examination is positive for occlusive superficial thrombophlebitis involving the right upper extremity PICC line containing basilic vein as well as the cephalic vein. There is no extension of this occlusive SVT to the deep venous system of right upper extremity. Consideration for PICC line removal could be performed as indicated Electronically Signed   By: Sandi Mariscal M.D.   On: 08/18/2019 17:40    Medications:  I have reviewed the patient's current medications. Scheduled: . amLODipine  10 mg Oral Daily  . chlorhexidine  15 mL Mouth/Throat BID  . Chlorhexidine Gluconate Cloth  6 each Topical Daily  . enoxaparin (LOVENOX) injection  40 mg Subcutaneous Q24H  . famotidine  20 mg Oral BID  . feeding supplement (NEPRO CARB STEADY)  237 mL Oral TID BM  . fluticasone  2 spray Each Nare Daily  . folic acid  1 mg Oral Daily  . ipratropium-albuterol  3 mL Nebulization TID  . mirtazapine  7.5 mg  Oral QHS  . nystatin  5 mL Oral QID  . potassium chloride  40 mEq Oral Once  . thiamine  100 mg Oral Daily    Assessment/Plan: 73 y.o.male with a history of ETOH abuse and suspected COPD who presented on 1/8 after collapsing.  Was initially intubated and sedated.  Now extubated.  Mental status has continued to improve.  Mental status not completely back to what I suspect was his  baseline but with his complications during this hospitalization, including DT's may likely never return to baseline.  If so, suspect a prolonged period of time for this to happen.    Recommendations: 1. No further neurologic intervention is recommended at this time.  If further questions arise, please call or page at that time.  Thank you for allowing neurology to participate in the care of this patient.   LOS: 23 days   Alexis Goodell, MD Neurology [phone] 08/20/2019  9:05 AM

## 2019-08-20 NOTE — Progress Notes (Signed)
  Speech Language Pathology Treatment: Dysphagia  Patient Details Name: Antonio [name] MRN: [id number] DOB: 07-02-1946 Today's Date: 08/20/2019 Time: [id number] SLP Time Calculation (min) (ACUTE ONLY): 42 min  Assessment / Plan / Recommendation Clinical Impression  Pt seen today for ongoing assessment of swallowing function; trials to upgrade diet consistency as indicated. Pt continues to present w/ some Confusion during his verbalizations and engagement; he does not follow commands verbalizing consistently and Pragmatic deficits noted. Min-Mod verbal/tactile cues were required for follow through w/ tasks - this can impact safety w/ oral intake. Pt is Edentulous at baseline. 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 This can impact swallowing comfort and desire for swallowing/intake in general.   Pt consumed trials of Minced foods moistened in puree, purees, and Nectar and thin liquids via Cup w/ no overt clinical s/s of aspiration noted; no overt coughing or decline in vocal quality or respiratory effort noted during/post trials. Pt was instructed on taking Small Sips Slowly via Cup -- NO STRAWS. Pt was pleased to have Water to drink. Oral phase appeared grossly [facility] w/ bolus management of all trial consistencies -- min increased mashing/gumming time w/ minced/softened solids. Moistening the minced foods appeared to aid in mashing, clearing. Encouraged pt to alternate foods/liquids when eating; small sips when drinking liquids via Cup. Distractions were reduced in room during the meal.   Pt appears to adequately tolerate trial consistencies for a Dysphagia level 2 (Minced foods w/ gravies) w/ Thin liquid diet w/ reduced risk for aspiration when following general aspiration and when given support setup, positioning, and monitoring at meals for needs/redirection to tasks. Recommend continue general aspiration precautions; Pills in a Puree for safer swallowing --  crushed vs whole. NSG updated.     HPI   Pt is a 74 yo male with a history of alcohol abuse, tobacco abuse and suspected COPD who presented to the ED on 07/27/2019 after being found down by EMS. He was admitted to the ICU for Acute Hypoxic and Hypercapnic Respiratory Failure from acute pulmonary edema; intubated and sedated. On further workup, he was found to have a grade I diastolic dysfunction, moderate aortic stenosis and AKI. Extubated on 08/11/2019. MRI of the brain reviewed and shows an incidental small meningioma and left temporal encephalomalcia but no etiology for mental status.       SLP Plan  Continue with current plan of care       Recommendations  Diet recommendations: Dysphagia 2 (fine chop);Thin liquid Liquids provided via: Cup;No straw Medication Administration: Whole meds with puree(vs Crushed  w/ puree as needed d/t Cognitive decline) Supervision: Staff to assist with self feeding;Full supervision/cueing for compensatory strategies;Patient able to self feed Compensations: Minimize environmental distractions;Slow rate;Small sips/bites;Lingual sweep for clearance of pocketing;Multiple dry swallows after each bite/sip;Follow solids with liquid Postural Changes and/or Swallow Maneuvers: Seated upright 90 degrees;Upright 30-60 min after meal                General recommendations: (Dietician f/u) Oral Care Recommendations: Oral care BID;Oral care before and after PO;Staff/trained caregiver to provide oral care Follow up Recommendations: Skilled Nursing facility SLP Visit Diagnosis: Dysphagia, oropharyngeal phase (R13.12) Plan: Continue with current plan of care       [place], [place], CCC-SLP Shandra Szymborski 08/20/2019, 11:26 AM

## 2019-08-20 NOTE — Care Management Important Message (Signed)
Important Message  Patient Details  Name: Antonio [name] MRN: [id number] Date of Birth: [birth year]   Medicare Important Message Given:  Yes     [name] 08/20/2019, 11:34 AM

## 2019-08-21 ENCOUNTER — Emergency Department (HOSPITAL_COMMUNITY): Payer: Self-pay

## 2019-08-21 ENCOUNTER — Encounter (HOSPITAL_COMMUNITY): Payer: Self-pay

## 2019-08-21 ENCOUNTER — Emergency Department (HOSPITAL_COMMUNITY)
Admission: EM | Admit: 2019-08-21 | Discharge: 2019-08-29 | Disposition: A | Discharge status: Skilled Nursing Facility | Payer: Self-pay | Attending: Emergency Medicine | Admitting: Emergency Medicine

## 2019-08-21 ENCOUNTER — Other Ambulatory Visit: Payer: Self-pay

## 2019-08-21 DIAGNOSIS — F17210 Nicotine dependence, cigarettes, uncomplicated: Secondary | ICD-10-CM | POA: Insufficient documentation

## 2019-08-21 DIAGNOSIS — J449 Chronic obstructive pulmonary disease, unspecified: Secondary | ICD-10-CM | POA: Insufficient documentation

## 2019-08-21 DIAGNOSIS — I10 Essential (primary) hypertension: Secondary | ICD-10-CM | POA: Insufficient documentation

## 2019-08-21 DIAGNOSIS — R4182 Altered mental status, unspecified: Secondary | ICD-10-CM

## 2019-08-21 DIAGNOSIS — Z20822 Contact with and (suspected) exposure to covid-19: Secondary | ICD-10-CM | POA: Insufficient documentation

## 2019-08-21 DIAGNOSIS — Z79899 Other long term (current) drug therapy: Secondary | ICD-10-CM | POA: Insufficient documentation

## 2019-08-21 HISTORY — DX: Chronic obstructive pulmonary disease, unspecified: J44.9

## 2019-08-21 LAB — CBC WITH DIFFERENTIAL/PLATELET
Abs Immature Granulocytes: 0.04 10*3/uL (ref 0.00–0.07)
Basophils Absolute: 0 10*3/uL (ref 0.0–0.1)
Basophils Relative: 1 %
Eosinophils Absolute: 0.1 10*3/uL (ref 0.0–0.5)
Eosinophils Relative: 2 %
HCT: 33.4 % — ABNORMAL LOW (ref 39.0–52.0)
Hemoglobin: 10.8 g/dL — ABNORMAL LOW (ref 13.0–17.0)
Immature Granulocytes: 1 %
Lymphocytes Relative: 15 %
Lymphs Abs: 0.8 10*3/uL (ref 0.7–4.0)
MCH: 31.9 pg (ref 26.0–34.0)
MCHC: 32.3 g/dL (ref 30.0–36.0)
MCV: 98.5 fL (ref 80.0–100.0)
Monocytes Absolute: 0.5 10*3/uL (ref 0.1–1.0)
Monocytes Relative: 10 %
Neutro Abs: 3.7 10*3/uL (ref 1.7–7.7)
Neutrophils Relative %: 71 %
Platelets: 256 10*3/uL (ref 150–400)
RBC: 3.39 MIL/uL — ABNORMAL LOW (ref 4.22–5.81)
RDW: 14.3 % (ref 11.5–15.5)
WBC: 5.2 10*3/uL (ref 4.0–10.5)
nRBC: 0 % (ref 0.0–0.2)

## 2019-08-21 LAB — URINALYSIS, ROUTINE W REFLEX MICROSCOPIC
Bacteria, UA: NONE SEEN
Bilirubin Urine: NEGATIVE
Glucose, UA: NEGATIVE mg/dL
Hgb urine dipstick: NEGATIVE
Ketones, ur: 5 mg/dL — AB
Leukocytes,Ua: NEGATIVE
Nitrite: NEGATIVE
Protein, ur: 30 mg/dL — AB
Specific Gravity, Urine: 1.018 (ref 1.005–1.030)
pH: 5 (ref 5.0–8.0)

## 2019-08-21 LAB — COMPREHENSIVE METABOLIC PANEL
ALT: 104 U/L — ABNORMAL HIGH (ref 0–44)
AST: 52 U/L — ABNORMAL HIGH (ref 15–41)
Albumin: 3.4 g/dL — ABNORMAL LOW (ref 3.5–5.0)
Alkaline Phosphatase: 68 U/L (ref 38–126)
Anion gap: 9 (ref 5–15)
BUN: 10 mg/dL (ref 8–23)
CO2: 24 mmol/L (ref 22–32)
Calcium: 9 mg/dL (ref 8.9–10.3)
Chloride: 107 mmol/L (ref 98–111)
Creatinine, Ser: 1.49 mg/dL — ABNORMAL HIGH (ref 0.61–1.24)
GFR calc Af Amer: 53 mL/min — ABNORMAL LOW (ref >60)
GFR calc non Af Amer: 46 mL/min — ABNORMAL LOW (ref >60)
Glucose, Bld: 103 mg/dL — ABNORMAL HIGH (ref 70–99)
Potassium: 3.3 mmol/L — ABNORMAL LOW (ref 3.5–5.1)
Sodium: 140 mmol/L (ref 135–145)
Total Bilirubin: 1 mg/dL (ref 0.3–1.2)
Total Protein: 6.5 g/dL (ref 6.5–8.1)

## 2019-08-21 LAB — ETHANOL: Alcohol, Ethyl (B): <10 mg/dL (ref <10)

## 2019-08-21 LAB — CBG MONITORING, ED: Glucose-Capillary: 84 mg/dL (ref 70–99)

## 2019-08-21 IMAGING — CR DG CHEST 2V
2 series · 2 of 2 positions shown · non-contrast
Comparison: [DATE]

CLINICAL DATA: Altered mental status.

EXAM:
CHEST - 2 VIEW

[w chest lat]
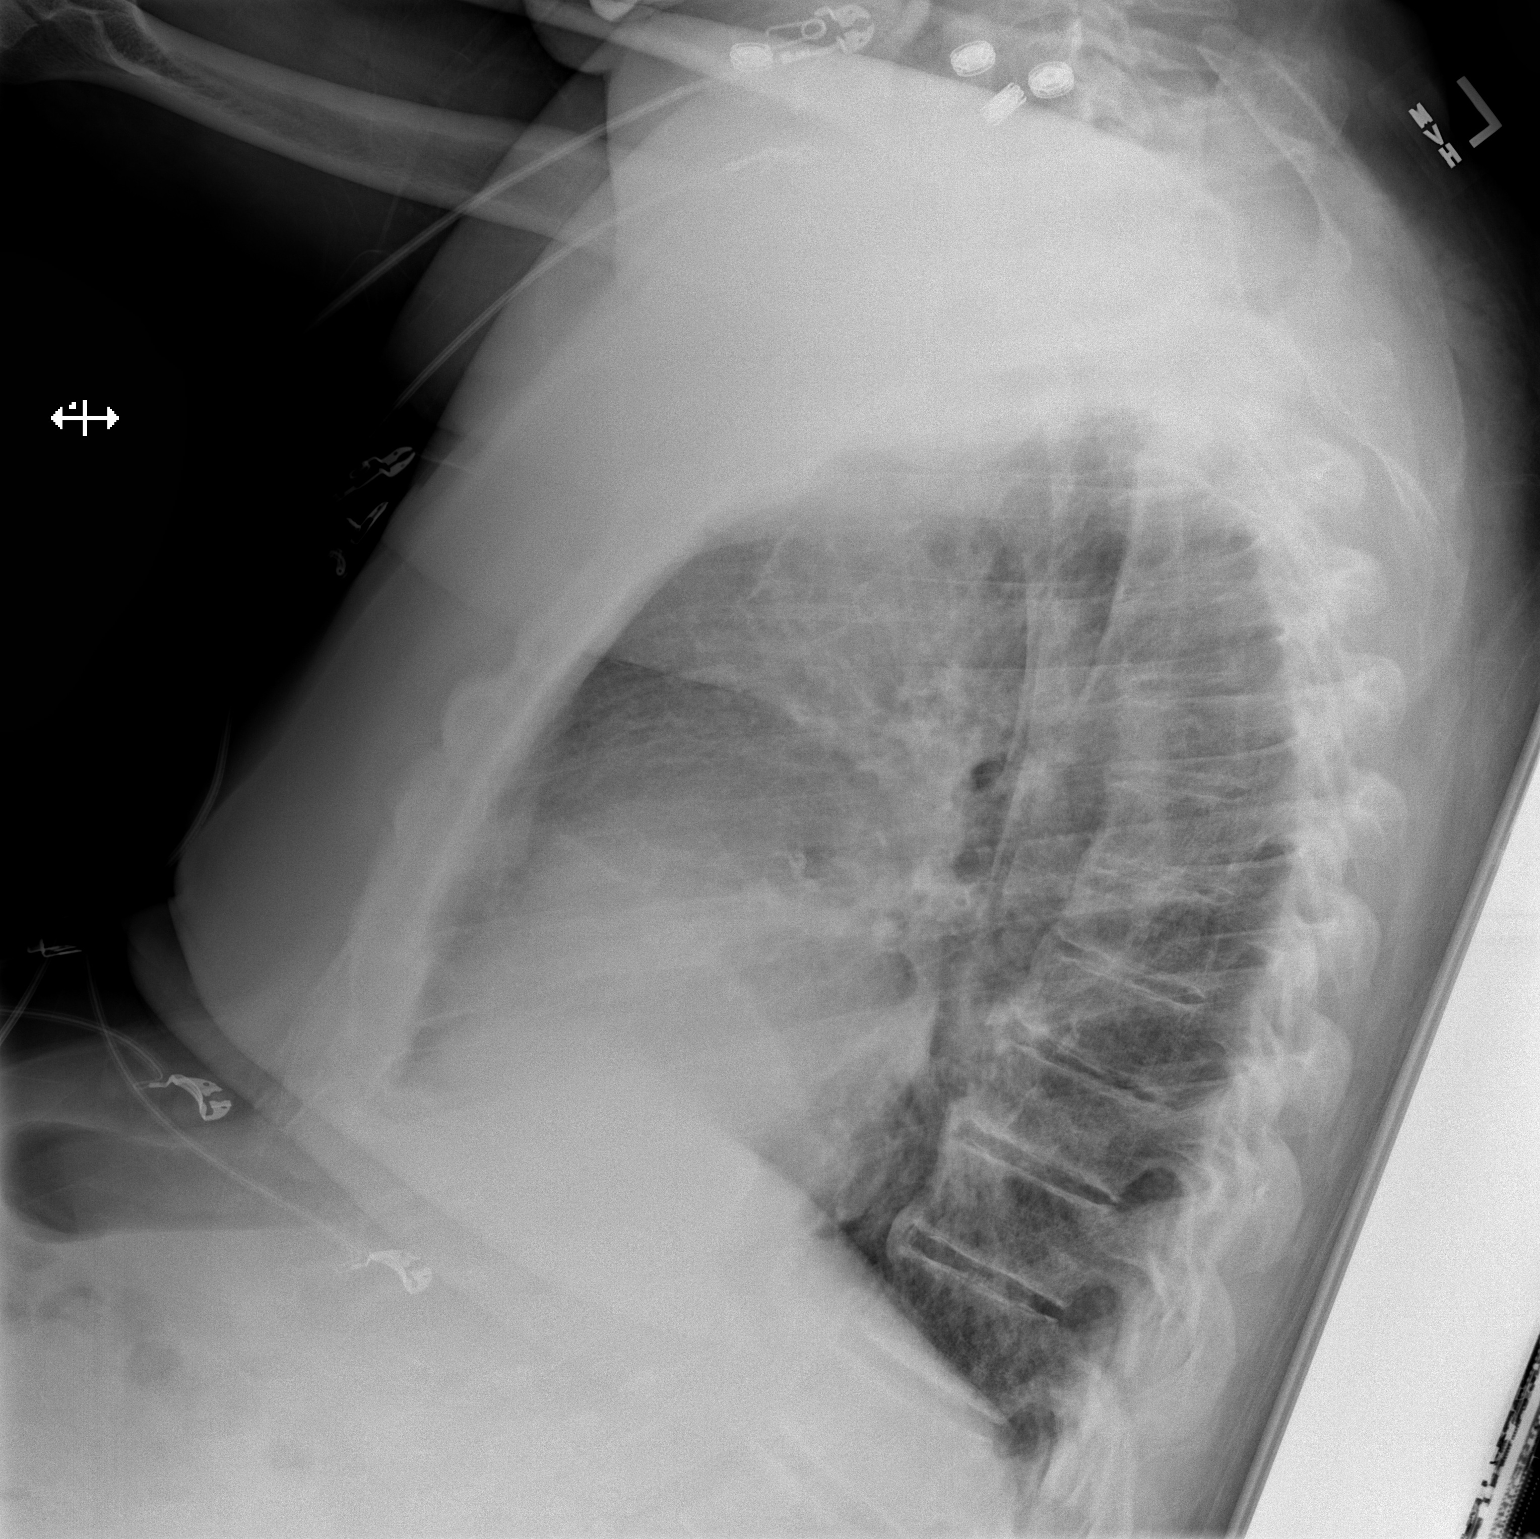

[x chest ap]
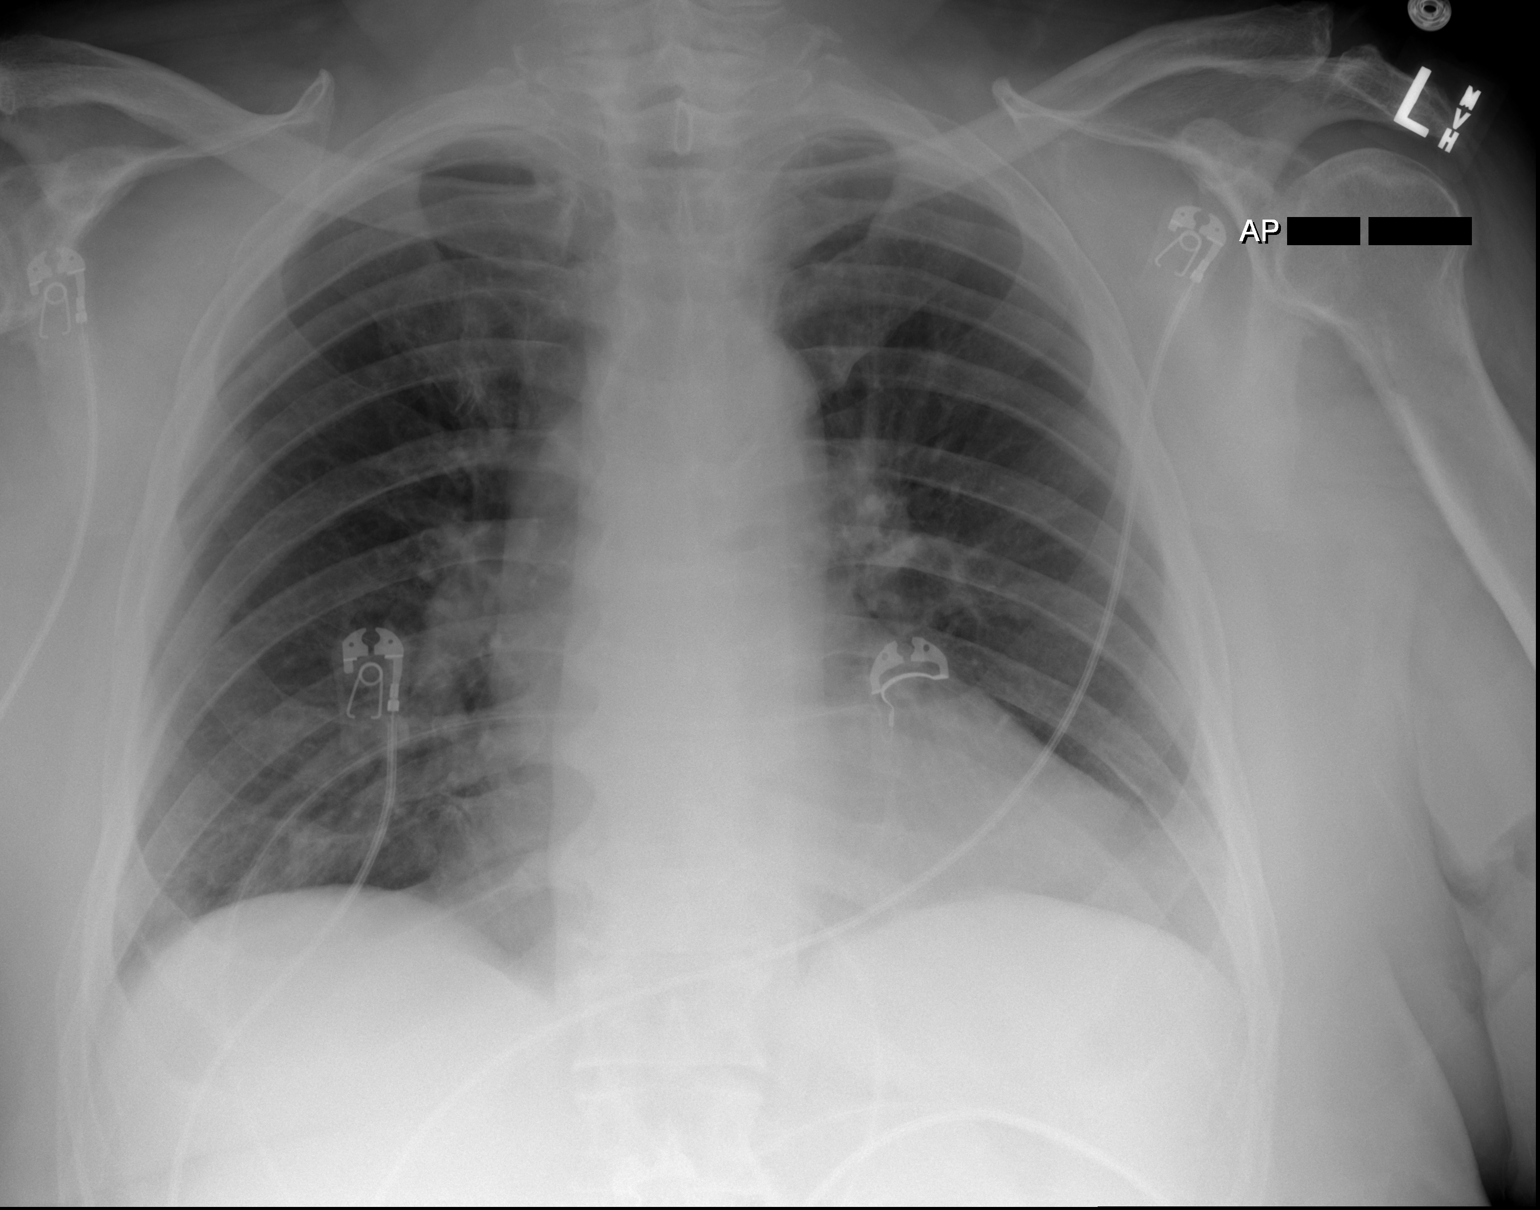

[2 of 2 positions shown; findings below may reference images not displayed]

FINDINGS: Very mild atelectasis and/or early infiltrate is seen within the
right lung base. There is no evidence of a pleural effusion or
pneumothorax. The heart size and mediastinal contours are within
normal limits. Multilevel degenerative changes seen throughout the
thoracic spine.
IMPRESSION: 1. Very mild right basilar atelectasis and/or early infiltrate.

## 2019-08-21 IMAGING — CT CT HEAD W/O CM
3 series · 14 of 47 positions shown, 16 images · non-contrast
Comparison: [DATE]

CLINICAL DATA: Altered mental status.

EXAM:
CT HEAD WITHOUT CONTRAST
TECHNIQUE: Contiguous axial images were obtained from the base of the skull
through the vertex without intravenous contrast.

[Series 2: head wo · axial · 0.47mm/px · z∈[+1266,+1401]mm · 8 of 33 slices shown, 10 images]
[im 3/33  brain]
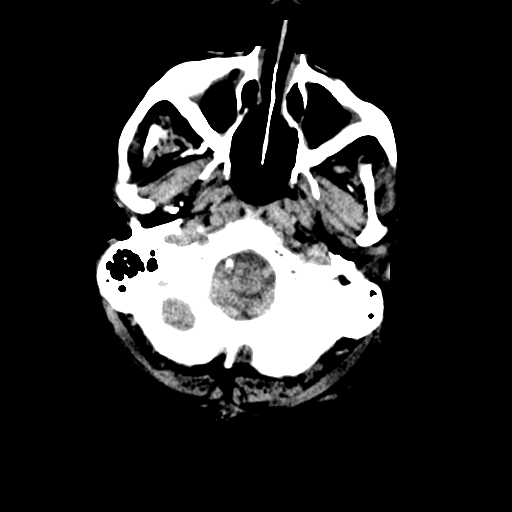
[im 3/33  bone]
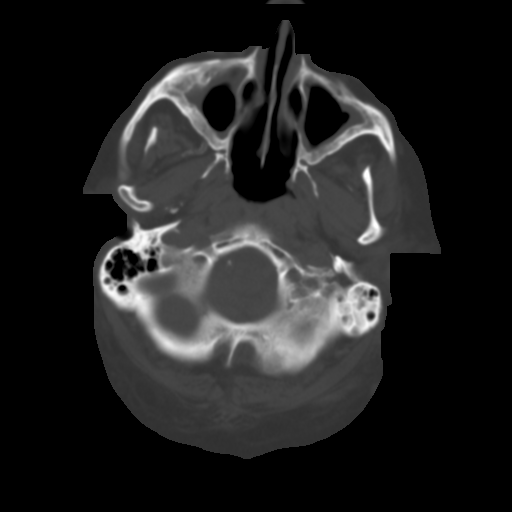
[im 7/33  brain]
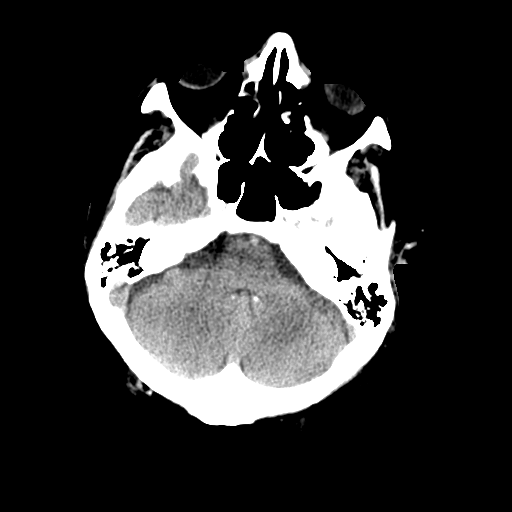
[im 10/33  brain]
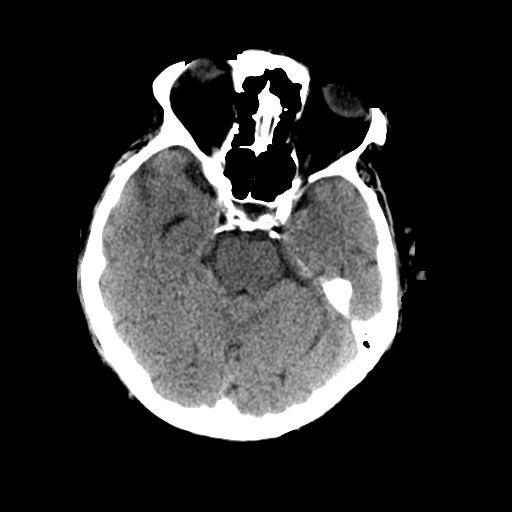
[im 15/33  brain]
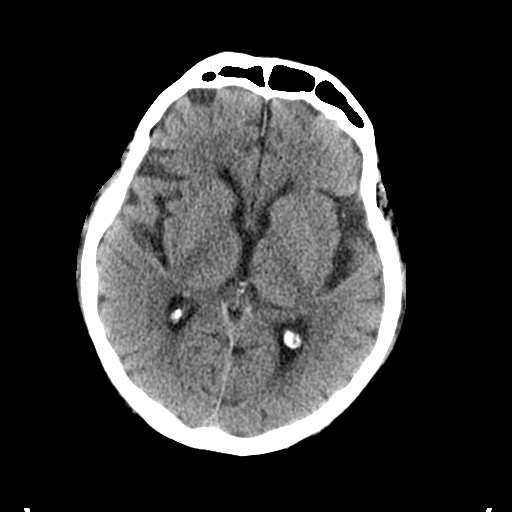
[im 18/33  brain]
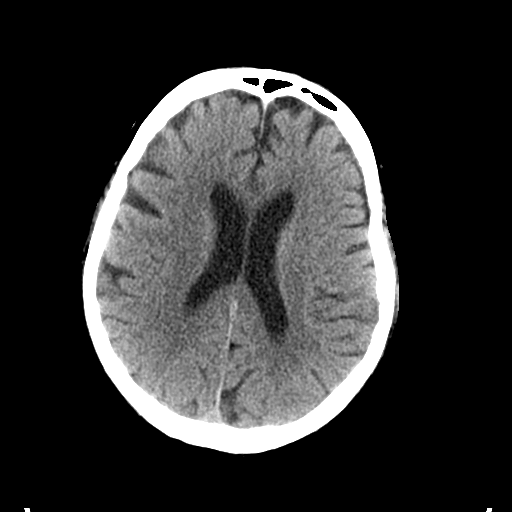
[im 18/33  bone]
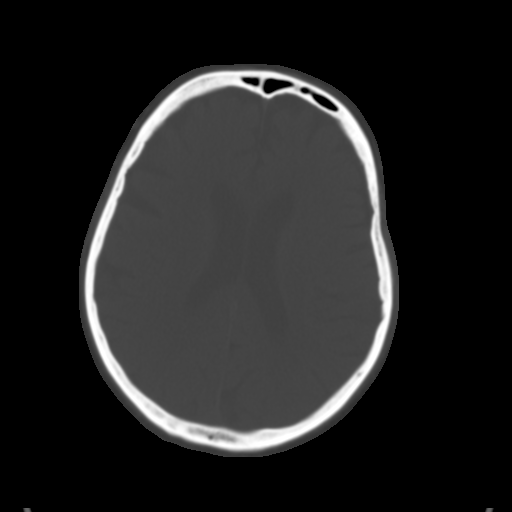
[im 23/33  brain]
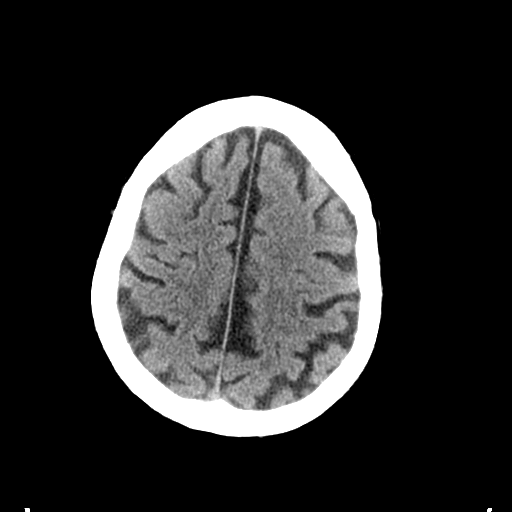
[im 26/33  brain]
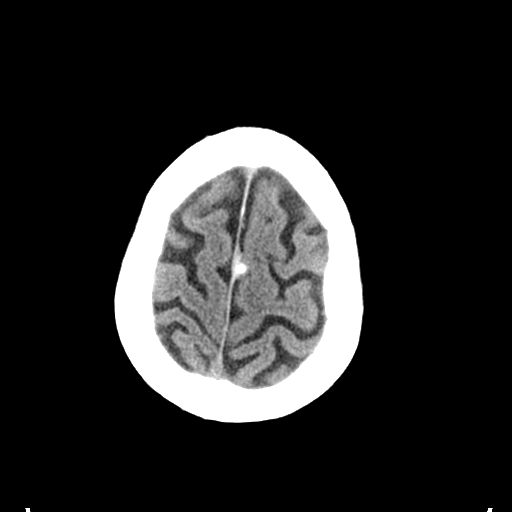
[im 30/33  brain]
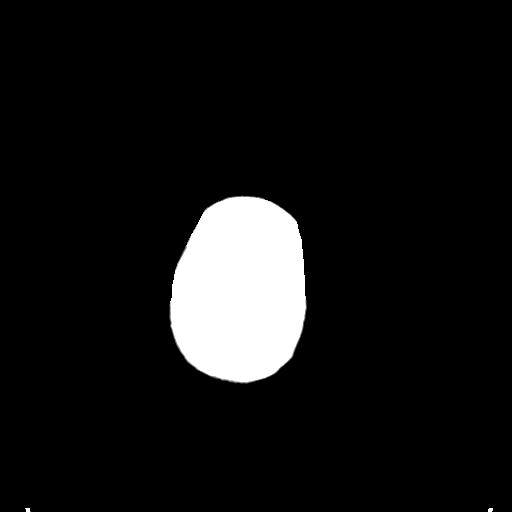

[Series 4: coronal soft tissue · coronal · 0.40mm/px · 3 of 65 slices shown]
[im 22/65  brain]
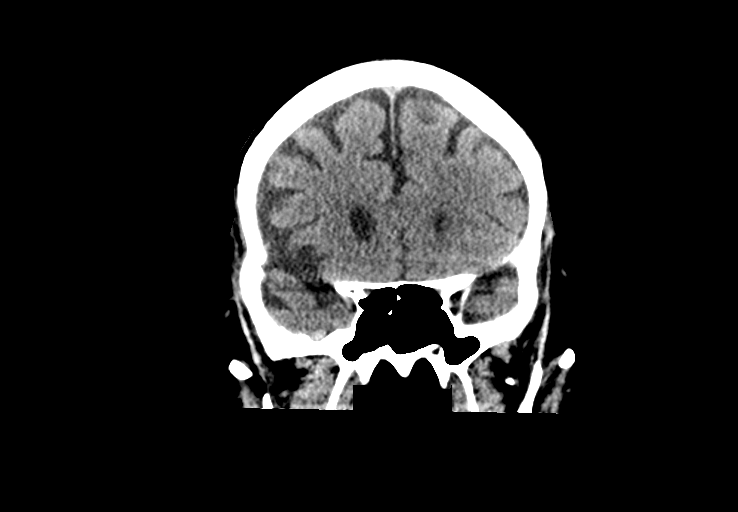
[im 29/65  brain]
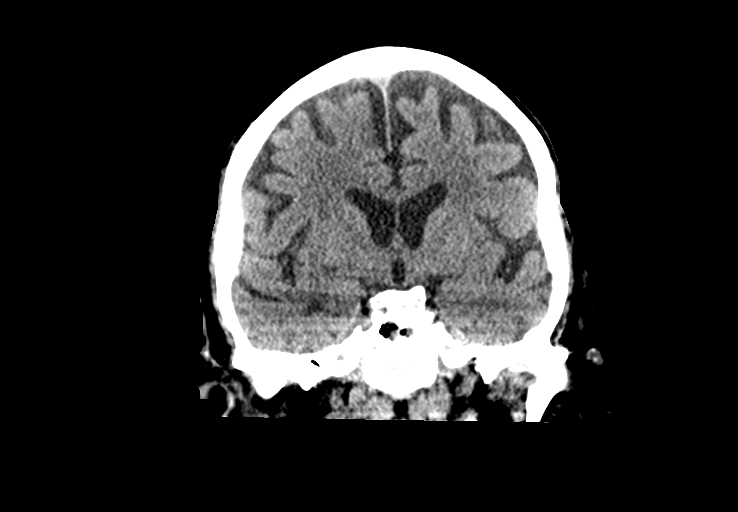
[im 36/65  brain]
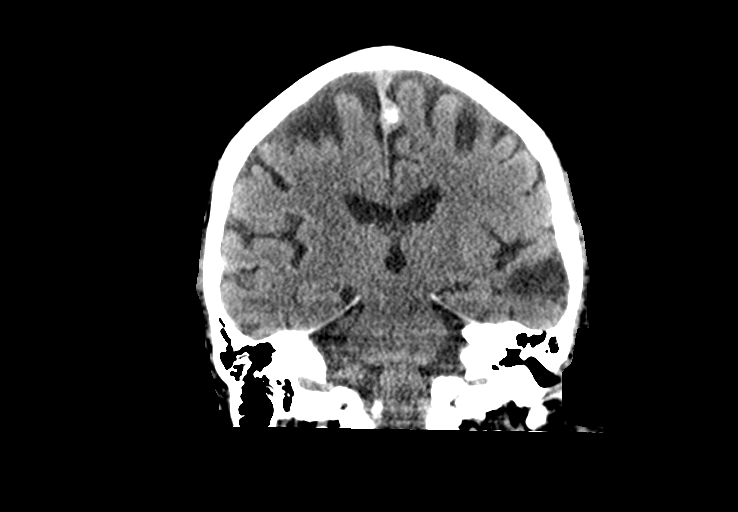

[Series 5: sagittal soft tissue · sagittal · 0.40mm/px · 3 of 53 slices shown]
[im 18/53  brain]
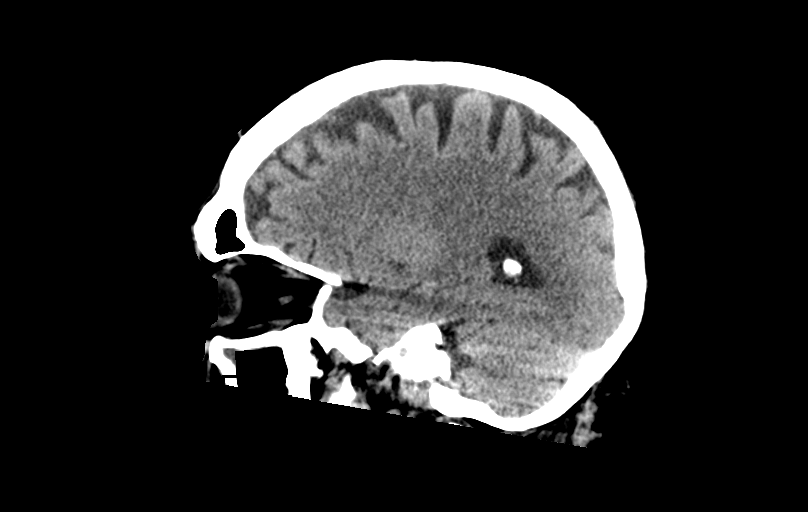
[im 27/53  brain]
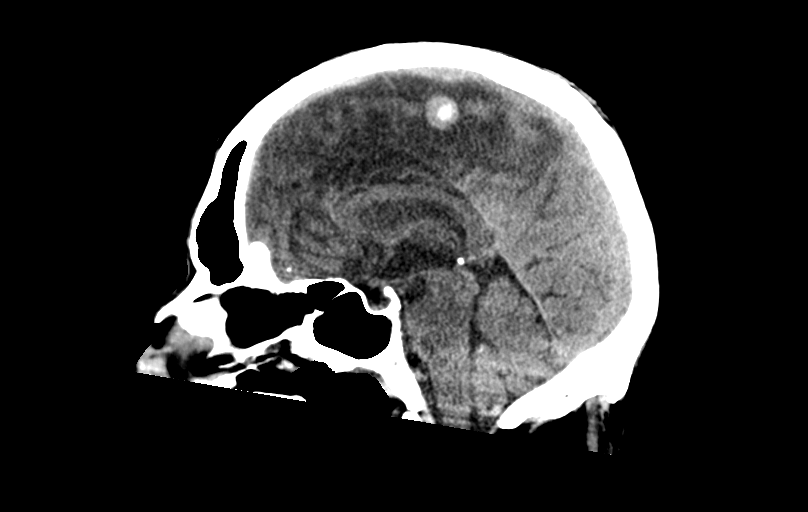
[im 35/53  brain]
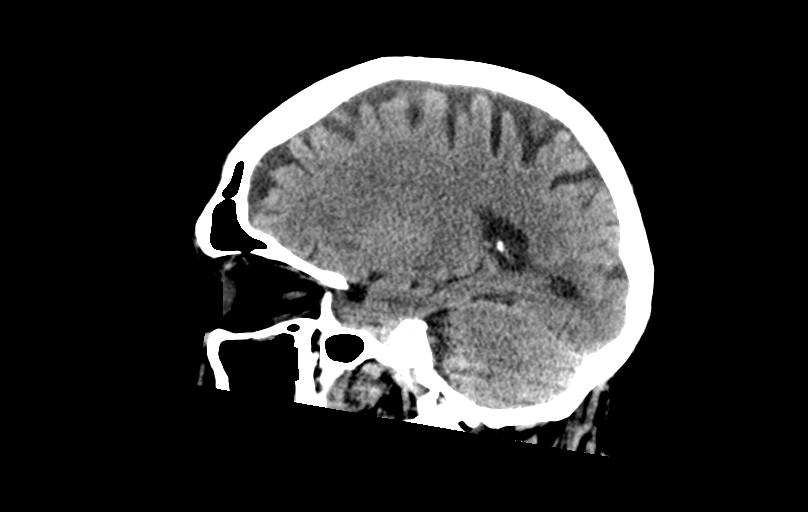

[14 of 47 positions shown; findings below may reference images not displayed]

FINDINGS: Brain: There is mild cerebral atrophy with widening of the
extra-axial spaces and ventricular dilatation.
There are areas of decreased attenuation within the white matter
tracts of the supratentorial brain, consistent with microvascular
disease changes.

A small chronic area of cortical encephalomalacia, with adjacent
chronic white matter low attenuation is seen within the left
temporoparietal region. This is present on the prior exam.

A stable 5 mm left parafalcine hyperdensity is seen likely
consistent with a stable meningioma.

Vascular: No hyperdense vessel or unexpected calcification.

Skull: Normal. Negative for fracture or focal lesion.

Sinuses/Orbits: No acute finding.

Other: None.
IMPRESSION: 1. Generalized cerebral atrophy.
2. No acute intracranial abnormality.
3. Chronic left frontoparietal infarct.

## 2019-08-21 MED ORDER — POTASSIUM CHLORIDE CRYS ER 20 MEQ PO TBCR
40.0000 meq | EXTENDED_RELEASE_TABLET | Freq: Once | ORAL | Status: AC
  Administered 2019-08-21: 23:00:00 40 meq via ORAL
  Filled 2019-08-21: qty 2

## 2019-08-21 MED ORDER — AMLODIPINE BESYLATE 5 MG PO TABS
10.0000 mg | ORAL_TABLET | Freq: Every day | ORAL | Status: DC
  Administered 2019-08-23 – 2019-08-29 (×7): 10 mg via ORAL
  Filled 2019-08-21 (×6): qty 2

## 2019-08-21 MED ORDER — SODIUM CHLORIDE 0.9 % IV BOLUS: 1000.0000 mL | Freq: Once | INTRAVENOUS | Status: DC

## 2019-08-21 MED ORDER — FAMOTIDINE 20 MG PO TABS
20.0000 mg | ORAL_TABLET | Freq: Every day | ORAL | Status: DC
  Administered 2019-08-23 – 2019-08-29 (×7): 20 mg via ORAL
  Filled 2019-08-21 (×6): qty 1

## 2019-08-21 MED ORDER — MIRTAZAPINE 7.5 MG PO TABS
15.0000 mg | ORAL_TABLET | Freq: Every day | ORAL | Status: DC
  Administered 2019-08-22 – 2019-08-27 (×6): 15 mg via ORAL
  Filled 2019-08-21 (×8): qty 2

## 2019-08-21 NOTE — Progress Notes (Signed)
CSW spoke to the EDP who states that pt was in the hospital for a month, is not safe to go back home and live by himself as he did before.  CSW will continue to follow for D/C needs.  Antonio [name]. Zariyah Stephens, LCSW, LCAS, CSI Transitions of Care Clinical Social Worker Care Coordination Department Ph: [phone]

## 2019-08-21 NOTE — Progress Notes (Addendum)
Called DON at [facility] after 11:30 pm explaining pt had calmed and DON refused to allow the pt to come back tonight and who after some discussion stated that a possibility for a plan to return would be to see 48 hours of documented good behavior with some type of explanation as to why pt was behavioral at [facility] and a reasonable tx proposed that would give the facility good cause believe why pt would be more appropriate for return than before the pt D/C'd.  EPD stated per imaging pt exhibited no acute stroke as was the pt's son's concern earlier.  EPD updated.  CSW will continue to follow for D/C needs.  Alphonse Guild. Zakari Couchman, LCSW, LCAS, CSI Transitions of Care Clinical Social Worker Care Coordination Department Ph: [phone]

## 2019-08-21 NOTE — TOC Initial Note (Signed)
Transition of Care [facility]) - Initial/Assessment Note    Patient Details  Name: Antonio [name] MRN: [id number] Date of Birth: Mar 15, 1946  Transition of Care [facility]) CM/SW Contact:    Claudine Mouton, LCSW Phone Number: 08/21/2019, 11:45 PM  Clinical Narrative:        Pt's son Mortimer Fries at ph: [phone] and his brother Edd Arbour want to come to the [facility] ED on 08/22/19 to counsel the pt and convince the pt to reach acceptance with coming to [facility] in an acceptable manner. CSW tried to prepare pt's brother for eventual options should this not happen and became agitated at the "system" and some at some length about he feels about the "system".  Pt and pt's brother would like to come the [facility] ED on 2/3 and speak to the pt and want [facility] ED to know the family is involved, fully supportive and willing to assist in getting the pt back to [facility].   Pt's son was hoping the rules could be "bent" so that the pt's sons could each speak to the pt to convince him rather than just one son be "designated" as the family member allowed to speak to the pt.  CSW voiced understanding.           Expected Discharge Plan: Skilled Nursing Facility Barriers to Discharge: Other (comment)([facility] stating pt is discharged)   Patient Goals and CMS Choice Patient states their goals for this hospitalization and ongoing recovery are:: Go home      Expected Discharge Plan and Services Expected Discharge Plan: [place]       Living arrangements for the past 2 months: ([place] then [facility])                                      Prior Living Arrangements/Services Living arrangements for the past 2 months: ([place] then [employer] [facility] [employer]) Lives with:: Facility Resident   Do you feel safe going back to the place where you live?: No      Need for Family Participation in Patient Care: Yes (Comment) Care giver support system in place?: No (comment)      Activities of Daily Living       Permission Sought/Granted Permission sought to share information with : Facility Art therapist granted to share information with : Yes, Verbal Permission Granted              Emotional Assessment       Orientation: : Oriented to Self, Oriented to Place      Admission diagnosis:  confused Patient Active Problem List   Diagnosis Date Noted  . Acute diastolic CHF (congestive heart failure) ([place])   . Essential hypertension   . Acute metabolic encephalopathy   . Pulmonary infiltrates   . Pulmonary disease   . Hypernatremia   . Respiratory failure ([place]) 07/28/2019   PCP:  Patient, No Pcp Per Pharmacy:  No Pharmacies Listed    Social Determinants of Health (SDOH) Interventions    Readmission Risk Interventions No flowsheet data found.

## 2019-08-21 NOTE — Progress Notes (Addendum)
Pt's son Mortimer Fries at ph: [phone] and his brother Edd Arbour want to come to the [facility] ED on 08/22/19 to counsel the pt and convince the pt to reach acceptance with coming to [facility] in an acceptable manner. CSW tried to prepare pt's brother for eventual options should this not happen and became agitated at the "system" and some at some length about he feels about the "system".  Pt and pt's brother would like to come the [facility] ED on 2/3 and speak to the pt and want [facility] ED to know the family is involved, fully supportive and willing to assist in getting the pt back to [facility].   Pt's son was hoping the rules could be "bent" so that the pt's sons could each speak to the pt to convince him rather than just one son be "designated" as the family member allowed to speak to the pt.  CSW voiced understanding.  CSW will continue to follow for D/C needs.  Alphonse Guild. Malka Bocek, LCSW, LCAS, CSI Transitions of Care Clinical Social Worker Care Coordination Department Ph: [phone]

## 2019-08-21 NOTE — ED Provider Notes (Signed)
[place] DEPT Provider Note   CSN: [id number] Arrival date & time: 08/21/19  1529     History Chief Complaint  Patient presents with  . Altered Mental Status    Antonio [name] is a 74 y.o. male with a past medical history of COPD, alcohol abuse, tobacco abuse with recent admission from 07/27/2019-08/20/19 for respiratory failure secondary to COPD and CHF, hypernatremia, metabolic encephalopathy secondary to critical illness presenting to the ED from nursing facility for altered mental status.  History is limited as patient appears confused.  EMS states that they were called out due to worsening confusion, agitation since he presented to [facility] yesterday after being discharged from the hospital.  Son states that they were concerned that he was "throwing himself on the ground, covered in feces, does not want anyone to take care of him."  Patient states that he is not having any pain in his chest, abdomen or head.  He is stating "just do not send me back there, I want to go back there."  He denies any injuries or falls, numbness in arms or legs, vision changes, shortness of breath.  Of note he did had a negative Covid test yesterday prior to being discharged from hospital stay.  HPI     Past Medical History:  Diagnosis Date  . COPD (chronic obstructive pulmonary disease) ([place])   . ETOH abuse   . Smoker     Patient Active Problem List   Diagnosis Date Noted  . Acute diastolic CHF (congestive heart failure) ([place])   . Essential hypertension   . Acute metabolic encephalopathy   . Pulmonary infiltrates   . Pulmonary disease   . Hypernatremia   . Respiratory failure ([place]) 07/28/2019    No past surgical history on file.     No family history on file.  Social History   Tobacco Use  . Smoking status: Current Every Day Smoker    Packs/day: 0.50    Years: 55.00    Pack years: 27.50    Types: Cigarettes  . Smokeless tobacco: Never Used    Substance Use Topics  . Alcohol use: Never  . Drug use: Never    Home Medications Prior to Admission medications   Medication Sig Start Date End Date Taking? Authorizing Provider  amLODipine (NORVASC) 10 MG tablet Take 1 tablet (10 mg total) by mouth daily. 08/20/19  Yes Fritzi Mandes, MD  famotidine (PEPCID) 20 MG tablet Take 1 tablet (20 mg total) by mouth daily. 08/20/19  Yes Fritzi Mandes, MD  mirtazapine (REMERON) 15 MG tablet Take 1 tablet (15 mg total) by mouth at bedtime. 08/20/19  Yes Fritzi Mandes, MD  Multiple Vitamins-Minerals (MULTIVITAMIN WITH MINERALS) tablet Take 1 tablet by mouth daily. 08/20/19 08/19/20 Yes Fritzi Mandes, MD  Nutritional Supplements (FEEDING SUPPLEMENT, NEPRO CARB STEADY,) LIQD Take 237 mLs by mouth 3 (three) times daily between meals. 08/20/19  Yes Fritzi Mandes, MD  feeding supplement, ENSURE ENLIVE, (ENSURE ENLIVE) LIQD Take 237 mLs by mouth 3 (three) times daily between meals. Patient not taking: Reported on 08/21/2019 08/20/19   Fritzi Mandes, MD    Allergies    Patient has no known allergies.  Review of Systems   Review of Systems  Unable to perform ROS: Mental status change  Respiratory: Negative for cough and shortness of breath.   Cardiovascular: Negative for chest pain.  Gastrointestinal: Negative for abdominal pain.  Musculoskeletal: Negative for myalgias.  Neurological: Negative for headaches.    Physical Exam  Updated Vital Signs BP (!) 160/91   Pulse 94   Temp 98.7 F (37.1 C) (Oral)   Resp (!) 23   SpO2 98%   Physical Exam Vitals and nursing note reviewed.  Constitutional:      General: He is not in acute distress.    Appearance: He is well-developed. He is obese.     Comments: Speaking in complete sentences without difficulty.  No signs of respiratory distress.  HENT:     Head: Normocephalic and atraumatic.     Nose: Nose normal.  Eyes:     General: No scleral icterus.       Right eye: No discharge.        Left eye: No discharge.      Conjunctiva/sclera: Conjunctivae normal.     Pupils: Pupils are equal, round, and reactive to light.  Cardiovascular:     Rate and Rhythm: Normal rate and regular rhythm.     Heart sounds: Normal heart sounds. No murmur. No friction rub. No gallop.   Pulmonary:     Effort: Pulmonary effort is normal. No respiratory distress.     Breath sounds: Normal breath sounds.  Abdominal:     General: Bowel sounds are normal. There is no distension.     Palpations: Abdomen is soft.     Tenderness: There is no abdominal tenderness. There is no guarding.     Comments: Abdomen is soft, nontender nondistended.  Musculoskeletal:        General: Normal range of motion.     Cervical back: Normal range of motion and neck supple.  Skin:    General: Skin is warm and dry.     Findings: No rash.  Neurological:     Mental Status: He is alert.     Motor: No abnormal muscle tone.     Coordination: Coordination normal.     Comments: Alert, oriented to self and place.  Believes it is Aug 31, 1945.  He knows today is Tuesday.  He is able to name his children.  No facial asymmetry noted. Pupils reactive. No facial asymmetry noted. Cranial nerves appear grossly intact. Sensation intact to light touch on face, BUE and BLE. Strength 5/5 in BUE and BLE.      ED Results / Procedures / Treatments   Labs (all labs ordered are listed, but only abnormal results are displayed) Labs Reviewed  COMPREHENSIVE METABOLIC PANEL - Abnormal; Notable for the following components:      Result Value   Potassium 3.3 (*)    Glucose, Bld 103 (*)    Creatinine, Ser 1.49 (*)    Albumin 3.4 (*)    AST 52 (*)    ALT 104 (*)    GFR calc non Af Amer 46 (*)    GFR calc Af Amer 53 (*)    All other components within normal limits  CBC WITH DIFFERENTIAL/PLATELET - Abnormal; Notable for the following components:   RBC 3.39 (*)    Hemoglobin 10.8 (*)    HCT 33.4 (*)    All other components within normal limits  URINALYSIS, ROUTINE W  REFLEX MICROSCOPIC - Abnormal; Notable for the following components:   Color, Urine AMBER (*)    Ketones, ur 5 (*)    Protein, ur 30 (*)    All other components within normal limits  URINE CULTURE  ETHANOL  CBG MONITORING, ED    EKG EKG Interpretation  Date/Time:  Tuesday August 21 2019 15:54:12 EST Ventricular Rate:  98 PR Interval:    QRS Duration: 99 QT Interval:  378 QTC Calculation: 483 R Axis:   9 Text Interpretation: Sinus rhythm Atrial premature complex Consider anterior infarct Repol abnrm suggests ischemia, lateral leads No significant change since last tracing Confirmed by Isla Pence [phone]) on 08/21/2019 4:33:06 PM   Radiology DG Chest 2 View  Result Date: 08/21/2019 CLINICAL DATA:  Altered mental status. EXAM: CHEST - 2 VIEW COMPARISON:  August 11, 2019 FINDINGS: Very mild atelectasis and/or early infiltrate is seen within the right lung base. There is no evidence of a pleural effusion or pneumothorax. The heart size and mediastinal contours are within normal limits. Multilevel degenerative changes seen throughout the thoracic spine. IMPRESSION: 1. Very mild right basilar atelectasis and/or early infiltrate. Electronically Signed   By: Virgina Norfolk M.D.   On: 08/21/2019 18:07   CT Head Wo Contrast  Result Date: 08/21/2019 CLINICAL DATA:  Altered mental status. EXAM: CT HEAD WITHOUT CONTRAST TECHNIQUE: Contiguous axial images were obtained from the base of the skull through the vertex without intravenous contrast. COMPARISON:  August 16, 2019 FINDINGS: Brain: There is mild cerebral atrophy with widening of the extra-axial spaces and ventricular dilatation. There are areas of decreased attenuation within the white matter tracts of the supratentorial brain, consistent with microvascular disease changes. A small chronic area of cortical encephalomalacia, with adjacent chronic white matter low attenuation is seen within the left temporoparietal region. This is present  on the prior exam. A stable 5 mm left parafalcine hyperdensity is seen likely consistent with a stable meningioma. Vascular: No hyperdense vessel or unexpected calcification. Skull: Normal. Negative for fracture or focal lesion. Sinuses/Orbits: No acute finding. Other: None. IMPRESSION: 1. Generalized cerebral atrophy. 2. No acute intracranial abnormality. 3. Chronic left frontoparietal infarct. Electronically Signed   By: Virgina Norfolk M.D.   On: 08/21/2019 16:38    Procedures Procedures (including critical care time)  Medications Ordered in ED Medications  potassium chloride SA (KLOR-CON) CR tablet 40 mEq (has no administration in time range)  sodium chloride 0.9 % bolus 1,000 mL (has no administration in time range)  amLODipine (NORVASC) tablet 10 mg (has no administration in time range)  famotidine (PEPCID) tablet 20 mg (has no administration in time range)  mirtazapine (REMERON) tablet 15 mg (has no administration in time range)    ED Course  I have reviewed the triage vital signs and the nursing notes.  Pertinent labs & imaging results that were available during my care of the patient were reviewed by me and considered in my medical decision making (see chart for details).  Clinical Course as of Aug 20 2306  Tue Aug 21, 2019  [place], son:  [id number]   [HK]    Clinical Course User Index [HK] Delia Heady, [place]   MDM Rules/Calculators/A&P                      74 year old male with a past medical history of COPD, alcohol abuse, tobacco abuse with recent admission from January 8 to August 20, 2019 for respiratory failure secondary to COPD and CHF, metabolic encephalopathy secondary to critical illness, status post intubation for respiratory failure presenting to the ED for altered mental status from [place] facility.  EMS states that they were called out due to worsening confusion, agitation since he presented there yesterday after hospital discharge.   Patient states that he would not want to go back to that facility as  they are "not treating me right."  Prior to his admission last month patient was independent, resided at home by himself.  He denies any current chest pain, shortness of breath, headache, injuries or falls.  I spoke to his son who states that patient is a DNR, they know he is unable to care for himself at home after his hospitalization.  Work-up here shows normal CMP, CBC, urinalysis.  Chest x-ray and CT of the head are negative for acute abnormality.  Patient is medically clear but is awaiting social work consult regarding placement in another nursing facility/further discussion with family/sending back to current facility. Social work states that family is trying to obtain guardianship. I believe the decline in his mental status has been gradual since his hospitalization as there are no acute concerning findings today. I have ordered his home medications.  Final Clinical Impression(s) / ED Diagnoses Final diagnoses:  Altered mental status, unspecified altered mental status type    Rx / DC Orders ED Discharge Orders    None      Portions of this note were generated with Dragon dictation software. Dictation errors may occur despite best attempts at proofreading.    Delia Heady, PA-C 08/21/19 2308    Isla Pence, MD 08/21/19 2314

## 2019-08-21 NOTE — Progress Notes (Addendum)
CSW missed a call from the [address] at ph: [phone] and called back and left a VM requesting details as to why pt was sent to the ED after being admitted there on 08/20/19.  8:04 PM  CSW received a return call from the [facility] who stated that as soon as the pt arrived the pt began to not cooperate with staff and "layed down on a table" and after speaking with family pt refused to wear a mask, would lay on the floor naked and would defecate on himself and would not allow himself to be cleaned up, grabbed a treatment nurse, and terrified the male patients and upset the male patients as to why this behavior was being allowed.  Per DON, it was apparent from the hospital discharge notes pt from 12/28 pt was given Haldol due to behaviors on 12/28, but these behaviors were not apparent to the SNF "from the notes provided to them and/or the discharge papers".  Per the DON, pt is not allowing them to help them, to allow them to provide PPE nor to keep him hygienic.  Per the DON pt appears to be behavioral and if there is withdrawal as well, it is not clear.  Per DON, pt appears to also "just not want to be there" and presents as, "just behavioral" as a result AEB the pt "wallowing around on the floor" and pt is now going room to room and frightening other residents.  CSW will continue to follow for D/C needs.  Alphonse Guild. Ione [place], LCSW, LCAS, CSI Transitions of Care Clinical Social Worker Care Coordination Department Ph: [phone]

## 2019-08-21 NOTE — Progress Notes (Signed)
CSW called and spoke to the pt who stated he didn't "trust social workers", but after encouragement from the RN/NT pt continued to speak to the [place].  Pt asked if the CSW knew where his family was and CSW stated had the pt;s family's number and pt initially stated the CSW could call the pt's family "yourself" and pt provided verbal permission for the CSW to speak to his family.  When CSW asked if the pt would like to see or speak to his family the pt stated, "I do, but I don't want you calling my family".  CSW stated he just wanted to alert the pt's family to the pt's presence in the ED and the pt continued to say, "I don't want you to talk to my family, I don't want you anywhere near my family and pt repeated this X 3.  CSW will continue to follow for D/C needs.  Alphonse Guild. Evonte Prestage, LCSW, LCAS, CSI Transitions of Care Clinical Social Worker Care Coordination Department Ph: [phone]

## 2019-08-21 NOTE — ED Triage Notes (Signed)
Patient arrived via [facility] from [facility].  Patient was admitted to [facility] yesterday.   Per EMS patient is not confused. Per patient he is not happy with his care.   Patient is reporting he wants a cab to go back to home.   Patient agitated with facility and wants to leave.   A/Ox4 per ems     Hx. COPD?   Patient in wheelchair at [facility] place.

## 2019-08-21 NOTE — Progress Notes (Signed)
Per Olivia Mackie at [facility] the SNF MD states that the SNF, "cannot meet the pt's needs", but is unfamiliar with the details.  Olivia Mackie in admissions stated that she would call the DON at [facility] and have the DON call the CSW.  CSW will continue to follow for D/C needs.  Alphonse Guild. Idamay Hosein, LCSW, LCAS, CSI Transitions of Care Clinical Social Worker Care Coordination Department Ph: [phone]

## 2019-08-21 NOTE — Progress Notes (Signed)
CSW went to the pt's room and pt stated he wanted to talk to and see his family.  CSW asked for permission to speak to his family and pt provided verbal permission.  Pt then repeatedly asked for CSW's last name and when this was not provided pt laughed inappropriately.  Pt asked for help turning in his bed due to his back hutding and CSW alerted staff via [employer].  CSW will continue to follow for D/C needs.  Antonio [name]. Randeep Biondolillo, LCSW, LCAS, CSI Transitions of Care Clinical Social Worker Care Coordination Department Ph: [phone]

## 2019-08-21 NOTE — Progress Notes (Addendum)
CSW spoke to/updated pt's son Mortimer Fries who stated the pt was A&O as he left [facility] to go to the facility and was, "fine".  Pt's son then detailed how pt went from being almost taken off off his ventilator and allowed to die to almost a fully recovery from partial organ failure.  Pt would speak as if he was fully A&OX4 to, "speaking to a wall".  Pt's son is at a loss of what to do in regards to pt being un-redirectable and states that capacity waxes and wanes ad is hopeful pt can return to [facility] when possible and provided verbal permission to attempt to get the pt back to [facility].  Pt's son stated he would update his brother Edd Arbour and states that pt becomes more redirectable and states he was on the way to bring clothes to his father at [facility] when he got a call that his father was being brought back to the hospital at [facility] ED.  CSW prepared pt's son for possible eventuality that if pt cannot be sent back or placed elsewhere due to his documented behaviors and encouraged or convinced to go willingly to [facility] then the ED will be out of options and family will be expected to pick pt up. After much discussion about how incredible this is the pt's son voiced understanding, but was upset as he stated he and his brother have done every thing they could to assist in pt's care.  CSW again encouraged them to schedule a guardianship hearing and call [employer]PS to request assistance in speeding up the guardianship process.  CSW will continue to follow for D/C needs.  Alphonse Guild. Suyash Amory, LCSW, LCAS, CSI Transitions of Care Clinical Social Worker Care Coordination Department Ph: [phone]

## 2019-08-22 LAB — URINE CULTURE: Culture: NO GROWTH

## 2019-08-22 NOTE — ED Notes (Signed)
SW and son are still at bedside at this time.

## 2019-08-22 NOTE — ED Notes (Signed)
Pt provided meal tray

## 2019-08-22 NOTE — ED Notes (Signed)
While under my care since 7am, patient has been cooperative and compliant with ER staff. Patient has been resting most of the day. Physical therapy came and assessed the patient and the patient was very cooperative with them. The only thing the patient has requested has been food and coffee. Patient has been given those things upon request. Will continue to monitor patient.

## 2019-08-22 NOTE — ED Notes (Signed)
Patient using bedside commode at this time.  

## 2019-08-22 NOTE — ED Notes (Signed)
SW and son at bedside.

## 2019-08-22 NOTE — Evaluation (Signed)
Physical Therapy Evaluation Patient Details Name: Antonio [name] MRN: [id number] DOB: [birth year] Today's Date: 08/22/2019   History of Present Illness  74 y.o. male with a past medical history of COPD, alcohol abuse, tobacco abuse with recent admission from 07/27/2019-08/20/19 for respiratory failure secondary to COPD and CHF, hypernatremia, metabolic encephalopathy secondary to critical illness presenting to the ED from nursing facility for altered mental status.  Clinical Impression  Pt seen in ED and voicing significant concerns and frustrations with previous facility's care.  Pt states, "no one believes me."  Pt reports he will not d/c back to previous facility and did not seem agreeable to go to another facility either due to care concerns.  Pt strongly prefers to d/c home.  Uncertain of safety at home and reports being too weak to ambulate at facility and crawling on the floor.  Pt reports back pain however agreeable to sit EOB and then ambulated in room with RW during session.  Would recommend SNF upon d/c due to question if pt can support himself at home safely however pt strongly reports he wants to d/c home.  If home, recommend at least supervision for safety and HHPT.      Follow Up Recommendations SNF;Home health PT;Supervision for mobility/OOB    Equipment Recommendations  3in1 (PT)    Recommendations for Other Services       Precautions / Restrictions Precautions Precautions: Fall      Mobility  Bed Mobility Overal bed mobility: Needs Assistance Bed Mobility: Supine to Sit     Supine to sit: Min guard     General bed mobility comments: pt requested elevating HOB (he can do this at home) due to back pain, pt able to self assist using bed rail  Transfers Overall transfer level: Needs assistance Equipment used: Rolling walker (2 wheeled) Transfers: Sit to/from Stand Sit to Stand: Min guard         General transfer comment: min/guard for safety, pt prefers RW  taller then typical placement however met his wishes  Ambulation/Gait Ambulation/Gait assistance: Min guard Gait Distance (Feet): 15 Feet Assistive device: Rolling walker (2 wheeled) Gait Pattern/deviations: Step-through pattern;Decreased stride length     General Gait Details: able to ambulate with RW in room, no unsteadiness or LOB, utilized RW for support, denies symptoms  Science writer    Modified Rankin (Stroke Patients Only)       Balance Overall balance assessment: Needs assistance Sitting-balance support: Feet supported;No upper extremity supported Sitting balance-Leahy Scale: Fair     Standing balance support: Bilateral upper extremity supported Standing balance-Leahy Scale: Poor                               Pertinent Vitals/Pain Pain Assessment: Faces Faces Pain Scale: Hurts little more Pain Location: back Pain Descriptors / Indicators: Sore;Aching Pain Intervention(s): Monitored during session;Limited activity within patient's tolerance    Home Living Family/patient expects to be discharged to:: Private residence Living Arrangements: Alone             Home Equipment: Environmental consultant - 2 wheels;Kasandra Knudsen - single point Additional Comments: pt admitted from [facility], recent admission in January and has been at facility    Prior Function Level of Independence: Independent with assistive device(s)         Comments: reports he was not mobile at [facility], prior to SNF was ambulatory without  assistive device, reports too weak to ambulate and crawling on floor at SNF in attempts to leave     Hand Dominance        Extremity/Trunk Assessment   Upper Extremity Assessment Upper Extremity Assessment: Generalized weakness    Lower Extremity Assessment Lower Extremity Assessment: Generalized weakness       Communication   Communication: No difficulties  Cognition Arousal/Alertness: Awake/alert   Overall Cognitive  Status: No family/caregiver present to determine baseline cognitive functioning                                 General Comments: pt reports frustration with care at previous facility and states he will not go back, pt pleasant and willing to cooperate just voicing frustrations over "bent" fingers, being forced to take pills, screaming for help and no one assisting, also reports attempting to leave and crawling on floor      General Comments      Exercises     Assessment/Plan    PT Assessment Patient needs continued PT services  PT Problem List Decreased strength;Decreased range of motion;Decreased activity tolerance;Decreased balance;Decreased mobility;Decreased coordination       PT Treatment Interventions DME instruction;Gait training;Therapeutic exercise;Therapeutic activities;Functional mobility training;Balance training;Patient/family education    PT Goals (Current goals can be found in the Care Plan section)  Acute Rehab PT Goals PT Goal Formulation: With patient Time For Goal Achievement: 09/05/19 Potential to Achieve Goals: Good    Frequency Min 2X/week   Barriers to discharge        Co-evaluation               AM-PAC PT "6 Clicks" Mobility  Outcome Measure Help needed turning from your back to your side while in a flat bed without using bedrails?: A Little Help needed moving from lying on your back to sitting on the side of a flat bed without using bedrails?: A Little Help needed moving to and from a bed to a chair (including a wheelchair)?: A Little Help needed standing up from a chair using your arms (e.g., wheelchair or bedside chair)?: A Little Help needed to walk in hospital room?: A Little Help needed climbing 3-5 steps with a railing? : A Lot 6 Click Score: 17    End of Session   Activity Tolerance: Patient tolerated treatment well Patient left: in bed;with call bell/phone within reach;with bed alarm set;Other (comment)(Sitting EOB  per his request, RN aware)   PT Visit Diagnosis: Difficulty in walking, not elsewhere classified (R26.2);Muscle weakness (generalized) (M62.81)    Time: 6060-0459 PT Time Calculation (min) (ACUTE ONLY): 45 min   Charges:   PT Evaluation $PT Eval Moderate Complexity: 1 Mod PT Treatments $Therapeutic Activity: 8-22 mins       Arlyce Dice, DPT Acute Rehabilitation Services Office: [phone]   Trena Platt 08/22/2019, 4:27 PM

## 2019-08-22 NOTE — Progress Notes (Signed)
TOC CM spent time at bedside talking with pt, he was pleasant. States he lived at home alone and has been independent at home but now need rehab. Pt agreeable to going back to [facility] rehab on 08/23/2019. He wants to complete his rehab so he can return home. TOC CM assisted pt to bedside commode. He was able to use his walker. CSW updated. Will send progress notes on 08/23/2019 to [facility]. PT continues to recommend SNF. Pt has Medicare Part A and does not have benefit for HH. Pt states his son do not provide care for him, but did give permission for CM to speak to either son. Pt unable to pay out of pocket for caregiver in the home. [place], [place] ED TOC CM [phone]

## 2019-08-22 NOTE — ED Notes (Signed)
PT Contacted .

## 2019-08-22 NOTE — ED Notes (Signed)
PT at bedside.

## 2019-08-22 NOTE — Progress Notes (Addendum)
CSW spoke to the DON at [facility] who stated she is agreeable to revisiting the pt returning after 48 hours of documented good behavior at the ED and if the pt is willing to go back.  CSW explained steps that the [facility] CSW would be required to take  if pt is not accepted back due to no actual aggression taking place and due to there not being a notice of (30, 60, or any type of) discharge present that would have been filed due to any pt aggression.  CSW also explained how it is appreciated that it would not be necessary to have to, "go down that road" if the pt's documented behavior is appropriate in the ED, and if the pt is willing to return and thus, if the facility is willing to allow the pt another chance at participating in tx/pt/ot at [facility].    Per [facility] ED TOC RN CM , pt stated he would go back to [facility].  CSW will continue to follow for D/C needs.  Alphonse Guild. Lorel Lembo, LCSW, LCAS, CSI Transitions of Care Clinical Social Worker Care Coordination Department Ph: [phone]

## 2019-08-22 NOTE — ED Notes (Signed)
Pt resting quietly in room watching tv

## 2019-08-22 NOTE — Progress Notes (Addendum)
CSW received call from patient's son Mortimer Fries that he and patient's other son Edd Arbour are at the hospital to speak with patient about returning to [facility]. CSW escorted Ronnie to patient's room as only one person is allowed to visit. CSW and Edd Arbour has a lengthy conversation with patient about going back. Patient stated the people at [facility] are "crooks" but would not expand on what this means. When CSW stepped out to make a phone call Edd Arbour stated that patient stated the facility "bent his fingers back and give him meds he did not want." When CSW asked about this patient did not respond with yes or no.   Patient continued to refuse to go back to [facility] and was reluctant to go to another facility. Patient stated "I can take care of my self at home." Edd Arbour stated patient lives alone and cannot go back home to care for himself. Patient was independent . Patient then stated "well just take me to jail." Patient's sons reports they are trying to get guardianship over patient. CSW explained that because patient is alert and oriented x4 and his own decision maker we cannot force him to go back to [facility] or to any facility. When the possibility of patient going home with [facility] and DME was presented, paitent's son stated "there will be no other there to accept care of him." Patient's son Mortimer Fries reports they have tried to get patient to move in with one of them, but patient refuses that as well. Mortimer Fries reports "he wants to be independent." CSW spoke with EDP about patient possibly going home with [facility] and he agreed as patient was refusing SNF.  CSW spoke with Olivia Mackie with [facility] who reports they feel they cannot meet patient's needs as patient refused any and all care. Olivia Mackie reports patient would get out of his bed on to the floor, go into other patient's rooms via wheelchair. This all began within the first 24 hours patient was there.   CSW consulted with [facility] supervisor and will follow up with a  disposition plan for patient.   Golden Circle, LCSW Transitions of Care Department [facility] ED [phone]

## 2019-08-23 NOTE — ED Provider Notes (Signed)
Emergency Medicine Observation Re-evaluation Note  Antonio [name] is a 74 y.o. male, seen on rounds today.  Pt initially presented to the ED for complaints of Altered Mental Status Currently, the patient is awaiting placement.  The patient apparently refused to go to his nursing facility.  He continues to be a bit grumpy, yelled at me as I entered the room and said I am in here.  No specific complaint otherwise.  He has continued to not be aggressive here.  Per social work notes he should be allowed to go back to his rehab facility if he continues to not have aggression.  Physical Exam  BP (!) 123/52   Pulse 82   Temp 98.2 F (36.8 C) (Oral)   Resp 11   SpO2 97%  Physical Exam Vitals and nursing note reviewed.  Constitutional:      Appearance: He is well-developed.  HENT:     Head: Normocephalic and atraumatic.  Eyes:     Pupils: Pupils are equal, round, and reactive to light.  Neck:     Vascular: No JVD.  Cardiovascular:     Rate and Rhythm: Normal rate and regular rhythm.     Heart sounds: No murmur. No friction rub. No gallop.   Pulmonary:     Effort: No respiratory distress.     Breath sounds: No wheezing.  Abdominal:     General: There is no distension.     Tenderness: There is no guarding or rebound.  Musculoskeletal:        General: Normal range of motion.     Cervical back: Normal range of motion and neck supple.  Skin:    Coloration: Skin is not pale.     Findings: No rash.  Neurological:     Mental Status: He is alert and oriented to person, place, and time.  Psychiatric:        Behavior: Behavior normal.     ED Course / MDM  EKG:EKG Interpretation  Date/Time:  Tuesday August 21 2019 15:54:12 EST Ventricular Rate:  98 PR Interval:    QRS Duration: 99 QT Interval:  378 QTC Calculation: 483 R Axis:   9 Text Interpretation: Sinus rhythm Atrial premature complex Consider anterior infarct Repol abnrm suggests ischemia, lateral leads No significant change  since last tracing Confirmed by Isla Pence [id number]) on 08/21/2019 4:33:06 PM  Clinical Course as of Aug 22 744  Tue Aug 21, 2019  [place], son:  [phone]   [HK]    Clinical Course User Index [HK] Delia Heady, PA-C   I have reviewed the labs performed to date as well as medications administered while in observation.  Recent changes in the last 24 hours include  Mild aki, he refused IV fluids.  His continue to eat and drink here.. Plan  Current plan is for social work placement. Patient is not under full IVC at this time.   Deno Etienne, DO 08/23/19 [phone]

## 2019-08-23 NOTE — Progress Notes (Signed)
TOC CM faxed pt out to SNF rehab. [place], [place] ED TOC CM [phone]

## 2019-08-23 NOTE — NC FL2 (Signed)
[place] LEVEL OF CARE SCREENING TOOL     IDENTIFICATION  Patient Name: Antonio [name] Birthdate: 07-04-1946 Sex: male Admission Date (Current Location): 08/21/2019  [facility] and [place] Number:  Herbalist and Address:  [facility],  [place] [address] [place]      Provider Number: [id number]  Attending Physician Name and Address:  Default, Provider, MD  Relative Name and Phone Number:  Mateen Vanzeeland [facility]) [phone]    Current Level of Care: Hospital Recommended Level of Care: [place] Prior Approval Number:    Date Approved/Denied:   PASRR Number: [id number]  Discharge Plan: SNF    Current Diagnoses: Patient Active Problem List   Diagnosis Date Noted  . Acute diastolic CHF (congestive heart failure) ([place])   . Essential hypertension   . Acute metabolic encephalopathy   . Pulmonary infiltrates   . Pulmonary disease   . Hypernatremia   . Respiratory failure ([place]) 07/28/2019    Orientation RESPIRATION BLADDER Height & Weight     Self, Time, Situation, Place  Normal Continent Weight:   Height:     BEHAVIORAL SYMPTOMS/MOOD NEUROLOGICAL BOWEL NUTRITION STATUS      Continent Diet(heart healthy)  AMBULATORY STATUS COMMUNICATION OF NEEDS Skin   Limited Assist Verbally Normal                       Personal Care Assistance Level of Assistance  Bathing, Dressing, Feeding Bathing Assistance: Limited assistance Feeding assistance: Independent Dressing Assistance: Limited assistance     Functional Limitations Info  Sight, Hearing, Speech Sight Info: Adequate Hearing Info: Adequate Speech Info: Adequate    SPECIAL CARE FACTORS FREQUENCY  PT (By licensed PT), OT (By licensed OT)     PT Frequency: 5x weekly OT Frequency: 5x weekly            Contractures Contractures Info: Not present    Additional Factors Info  Code Status, Allergies Code Status Info: DNR Allergies Info: NKA            Current Medications (08/23/2019):  This is the current hospital active medication list Current Facility-Administered Medications  Medication Dose Route Frequency Provider Last Rate Last Admin  . amLODipine (NORVASC) tablet 10 mg  10 mg Oral Daily Khatri, Hina, PA-C      . famotidine (PEPCID) tablet 20 mg  20 mg Oral Daily Khatri, Hina, PA-C      . mirtazapine (REMERON) tablet 15 mg  15 mg Oral QHS Khatri, Hina, PA-C   15 mg at 08/23/19 0057  . sodium chloride 0.9 % bolus 1,000 mL  1,000 mL Intravenous Once Khatri, Hina, PA-C       Current Outpatient Medications  Medication Sig Dispense Refill  . amLODipine (NORVASC) 10 MG tablet Take 1 tablet (10 mg total) by mouth daily. 30 tablet 0  . famotidine (PEPCID) 20 MG tablet Take 1 tablet (20 mg total) by mouth daily. 30 tablet 0  . mirtazapine (REMERON) 15 MG tablet Take 1 tablet (15 mg total) by mouth at bedtime. 30 tablet 0  . Multiple Vitamins-Minerals (MULTIVITAMIN WITH MINERALS) tablet Take 1 tablet by mouth daily. 120 tablet 2  . Nutritional Supplements (FEEDING SUPPLEMENT, NEPRO CARB STEADY,) LIQD Take 237 mLs by mouth 3 (three) times daily between meals. 237 mL 0  . feeding supplement, ENSURE ENLIVE, (ENSURE ENLIVE) LIQD Take 237 mLs by mouth 3 (three) times daily between meals. (Patient not taking: Reported on 08/21/2019) 237 mL  12     Discharge Medications: Please see discharge summary for a list of discharge medications.  Relevant Imaging Results:  Relevant Lab Results:   Additional Information SS# [place] Sharie Amorin, [place]

## 2019-08-23 NOTE — ED Notes (Signed)
Pt continues to rest comfortably.  Denies needs.

## 2019-08-23 NOTE — Progress Notes (Signed)
CSW attempted to contact Lonn Georgia, DON with [facility], regarding patient's return today. CSW did not receive a message and left a VM for a call back. Of note, patient continue to be calm and cooperative in the ED, no issues have been reported.   Golden Circle, LCSW Transitions of Care Department [facility] ED [phone]

## 2019-08-23 NOTE — Progress Notes (Signed)
CSW received a call from pt's son Montey Hora at ph: [phone] who was requesting an update.  CSW relayed how pt's referral to [facility] was sent (son preferred [facility] due to proximity  Per pt's son Edd Arbour stated that a Ellison Hughs, who works as a Building control surveyor at [employer] stated to his friend Roselyn Meier (or [place]), that the pt's famil or the [facility] CSW's should try sending pt's referral there.   Per pt's son the number for [facility] in [place] is at ph: [phone].  2nd shift ED CSW called the number but did not get an answer.  CSW saw that per the hub this number is listed on the hub as the facility's number.  CSW will continue to follow for D/C needs.  Alphonse Guild. Yitzel Shasteen, LCSW, LCAS, CSI Transitions of Care Clinical Social Worker Care Coordination Department Ph: [phone]

## 2019-08-23 NOTE — ED Notes (Signed)
Pt remains calm and cooperative 

## 2019-08-23 NOTE — Progress Notes (Signed)
CSW received call back from [place] with [place] who reports the case was sent to [facility], Nurse Liaison, who reports they will not be accepting patient back. CSW and [facility] Supervisor Lurline Idol both spoke with Audelia Acton who reports because patient stated he refused to come back during a conversation in the morning and has refused some treatment here in the ED they will not accept him back. TOC Supervisor and CSW explained to [place] that in the evening 2nd shift CSW Roderic Palau and ED RN CM Edwin Cap both spoke with patient last night and that time patient agreed that he does need more assistance and cannot care for himself at home and is agreeable to SNF. CSW also spoke with patient today again and patient stated "I will go wherever you send me."   [facility] Supervisor continues to be in contact with [facility]ai[facility] Cowman to see if patient can return. In the meantime, CSW completed FL2 and faxed patient out to other facilities. Patient continue to be calm and cooperative.   Golden Circle, LCSW Transitions of Care Department [facility] ED [phone]

## 2019-08-23 NOTE — Progress Notes (Addendum)
CSW received a call from pt's son Antonio [name] and after updating him that [facility] had denied the pt.  Pt's son asked that pt be referred to [facility] ([place] previously denied the pt).  CSW called Cyndi Bender in admissions at [facility] (who will review) and Claiborne Billings at [facility] leaving a  HIPAA-compliant VM  asking her to review the pt again with the knowledge that pt has Medicare A (this was not on pt's chart).  CSW asked that Tammy and Ebony Hail with [facility] and [facility] review the pt with understanding p has Medicare A.  CSW will continue to follow for D/C needs.  Alphonse Guild. Alyzae Hawkey, LCSW, LCAS, CSI Transitions of Care Clinical Social Worker Care Coordination Department Ph: [phone]

## 2019-08-23 NOTE — Progress Notes (Signed)
CSW received a call from [place] at [facility] stating pt is declined.  CSW will continue to follow for D/C needs.  Alphonse Guild. Sharece Fleischhacker, LCSW, LCAS, CSI Transitions of Care Clinical Social Worker Care Coordination Department Ph: [phone]

## 2019-08-24 NOTE — Progress Notes (Addendum)
CSW received a call from pt's son Edd Arbour who stated he was out of town and can't help the pt until he D/C's from a SNF after a stay and that afterwards they can care for the pt in his or their home.  CSW updated pt's son on developments in regards to placement.  CSW will continue to follow for D/C needs.  Alphonse Guild. Solmon Bohr, LCSW, LCAS, CSI Transitions of Care Clinical Social Worker Care Coordination Department Ph: [phone]

## 2019-08-24 NOTE — ED Provider Notes (Addendum)
Patient seen on rounds today.  He is resting comfortably in bed.  States he has had chronic weakness in his back for 3 months and will try to get up today to walk his legs became weak.  No bowel or bladder dysfunction.  No injury from the fall and he was seen by Dr. Florina Ou and cleared.  On my exam today his strength in his lower extremities is 5/5.  Patient has been evaluated by physical therapy for this.  Placement is still pending.   Lacretia Leigh, MD 08/24/19 Harrel Lemon    Lacretia Leigh, MD 08/24/19 [phone]

## 2019-08-24 NOTE — Progress Notes (Addendum)
12:57p CSW received call from [facility] who reports after review from the DON, they have declined patient. CSW contacted patient's son Edd Arbour who reports he is going to speak with his friend who works there in hopes that they will reconsider.   CSW has contacted other SNFs. [facility], and [facility] Rehab have declined patient. [facility] and [facility] have no beds currently. CSW has contacted [facility] to review patient.   CSW spoke with patient to reassure him that we are working diligently to find him placement today. Patient was very accepting and continues to be calm and cooperative.   9:25a CSW attempted to contact Roselyn Meier with [facility] after speak with patient's son Edd Arbour. CSW got Carol's VM and left a message for a call back. Will continue to follow up.   Golden Circle, LCSW Transitions of Care Department [facility] ED [phone]

## 2019-08-24 NOTE — Progress Notes (Addendum)
TOC CM contacted [facility] and spoke to [facility], they are reviewing pt's medical records. Waiting for confirmation of acceptance. Jonnie Finner RN CCM, [facility] ED TOC CM [phone]  Contacted [facility] and spoke to the Director of Nursing and they cannot accept him at this time due to staffing.   Contacted [facility] and spoke to [place], [phone]. Will send to the Director of Nursing for review. Waiting call back. [place], [place] ED TOC CM [phone]

## 2019-08-24 NOTE — ED Notes (Signed)
Pt was found on the floor at 0615 due to an unobserved fall. Pt stated he tried to get up and his legs gave out. Pt denies any pain or injury.   [place], MD examined pt post fall, determined no injuries.

## 2019-08-25 NOTE — ED Notes (Signed)
Patient has sitter at bedside. Patient communicates needs to sitter. Patient has no complaints at this time.   This Probation officer will continue to monitor. Patient waiting for placement by social work.

## 2019-08-25 NOTE — Progress Notes (Addendum)
CSW reached out to:  1. [facility] - at ph: [phone]- CSW spoke to [place] in admissions at ph: [phone].    -Does not have a bed offer  2. [place] awaiting return call from [place] at [facility]   3. [place] - at ph: [phone] - Admissions Coordinator is on leave and the CSW spoke to the front desk  When asked about the pt's referral CSW was told that Tiffany in admissions is on Medical Leave, that Jonelle Sidle is the only once who can discuss referrals and that Jonelle Sidle will be in on Monday.  CSW will continue to follow for D/C needs.  Alphonse Guild. Sienna Stonehocker, LCSW, LCAS, CSI Transitions of Care Clinical Social Worker Care Coordination Department Ph: [phone]

## 2019-08-25 NOTE — ED Provider Notes (Signed)
Patient seen on rounds this morning.  He is resting comfortably.  He denies any specific new acute complaint.  No new active issues overnight.  Placement is still pending as per notes from yesterday evening.     Valarie Merino, MD 08/25/19 [phone]

## 2019-08-25 NOTE — Progress Notes (Signed)
CSW was able to reach [place] at [facility] and ask her to look at pt again as he now has Medicare A on his chart.  Rosendo Gros stated she would review again.   CSW will continue to follow for D/C needs.  Alphonse Guild. Adelie Croswell, LCSW, LCAS, CSI Transitions of Care Clinical Social Worker Care Coordination Department Ph: [phone]

## 2019-08-26 MED ORDER — ASPIRIN 325 MG PO TABS
325.0000 mg | ORAL_TABLET | Freq: Every day | ORAL | Status: DC
  Administered 2019-08-26 – 2019-08-29 (×4): 325 mg via ORAL
  Filled 2019-08-26 (×4): qty 1

## 2019-08-26 NOTE — ED Notes (Signed)
Pt has sitter at bedside 

## 2019-08-26 NOTE — ED Provider Notes (Signed)
Patient seen on rounds this morning.  CSW is working on placement.  No new active issues reported from overnight.     Valarie Merino, MD 08/26/19 [phone]

## 2019-08-26 NOTE — ED Notes (Signed)
Pt is resting and lying down watch television.

## 2019-08-26 NOTE — Progress Notes (Addendum)
CSW called Rosendo Gros at [place] at [employer] asked if they had been able look at pt's referral again and requested but did not get a call back.  CSW also called Tammy with [facility] and [facility] and left a hIPPA-compliant VM requesting a call back.  CSW will continue to follow for D/C needs.  Alphonse Guild. Alanna Storti, LCSW, LCAS, CSI Transitions of Care Clinical Social Worker Care Coordination Department Ph: [phone]

## 2019-08-26 NOTE — ED Notes (Signed)
Pt instructed that he was at the head of the bed and could not go any higher in bed. No other needs at this time.

## 2019-08-27 NOTE — ED Provider Notes (Signed)
Blood pressure (!) 165/60, pulse 94, temperature 98.6 F (37 C), temperature source Oral, resp. rate 16, weight 104.2 kg, SpO2 99 %.  In short, Antonio [name] is a 74 y.o. male with a chief complaint of Altered Mental Status .  Refer to the original H&P for additional details.  ALF likely tomorrow. FL2 pending.     Margette Fast, MD 08/27/19 [phone]

## 2019-08-27 NOTE — ED Notes (Signed)
Pt is calm and cooperative. He said that he would like to pursue going to live with a family member. This Probation officer called Leeroy Cha, SW and Leeroy Cha said that pt can ask his family if they can take him and go from there.

## 2019-08-27 NOTE — ED Notes (Signed)
Pt stood at bedside and used urinal with steady gait.

## 2019-08-27 NOTE — NC FL2 (Signed)
[place] LEVEL OF CARE SCREENING TOOL     IDENTIFICATION  Patient Name: Antonio [name] Birthdate: [birth year] Sex: male Admission Date (Current Location): 08/21/2019  [facility] and [place] Number:  Herbalist and Address:  [facility],  [place] [address] [place]      Provider Number: [id number]  Attending Physician Name and Address:  Default, Provider, MD  Relative Name and Phone Number:  Zabriel Dollins [facility]) [phone]    Current Level of Care: Hospital Recommended Level of Care: [place] Prior Approval Number:    Date Approved/Denied:   PASRR Number: [id number]  Discharge Plan: ([place])    Current Diagnoses: Patient Active Problem List   Diagnosis Date Noted  . Acute diastolic CHF (congestive heart failure) ([place])   . Essential hypertension   . Acute metabolic encephalopathy   . Pulmonary infiltrates   . Pulmonary disease   . Hypernatremia   . Respiratory failure ([place]) 07/28/2019    Orientation RESPIRATION BLADDER Height & Weight     Self, Time, Situation, Place  Normal Continent Weight: 104.2 kg Height:     BEHAVIORAL SYMPTOMS/MOOD NEUROLOGICAL BOWEL NUTRITION STATUS  Other (Comment)(cooperative)   Continent Diet(Regular)  AMBULATORY STATUS COMMUNICATION OF NEEDS Skin   Supervision Verbally Normal                       Personal Care Assistance Level of Assistance  Bathing, Dressing, Feeding Bathing Assistance: Limited assistance Feeding assistance: Independent Dressing Assistance: Limited assistance     Functional Limitations Info  Sight, Hearing, Speech Sight Info: Adequate Hearing Info: Adequate Speech Info: Adequate    SPECIAL CARE FACTORS FREQUENCY  PT (By licensed PT), OT (By licensed OT)     PT Frequency: 5x week OT Frequency: 5x week            Contractures Contractures Info: Not present    Additional Factors Info  Code Status, Allergies Code  Status Info: DNR Allergies Info: NKA           Current Medications (08/27/2019):  This is the current hospital active medication list Current Facility-Administered Medications  Medication Dose Route Frequency Provider Last Rate Last Admin  . amLODipine (NORVASC) tablet 10 mg  10 mg Oral Daily Khatri, Hina, PA-C   10 mg at 08/27/19 1004  . aspirin tablet 325 mg  325 mg Oral Daily Virgel Manifold, MD   325 mg at 08/27/19 1003  . famotidine (PEPCID) tablet 20 mg  20 mg Oral Daily Khatri, Hina, PA-C   20 mg at 08/27/19 1004  . mirtazapine (REMERON) tablet 15 mg  15 mg Oral QHS Khatri, Hina, PA-C   15 mg at 08/26/19 2144  . sodium chloride 0.9 % bolus 1,000 mL  1,000 mL Intravenous Once Khatri, Hina, PA-C       Current Outpatient Medications  Medication Sig Dispense Refill  . amLODipine (NORVASC) 10 MG tablet Take 1 tablet (10 mg total) by mouth daily. 30 tablet 0  . famotidine (PEPCID) 20 MG tablet Take 1 tablet (20 mg total) by mouth daily. 30 tablet 0  . mirtazapine (REMERON) 15 MG tablet Take 1 tablet (15 mg total) by mouth at bedtime. 30 tablet 0  . Multiple Vitamins-Minerals (MULTIVITAMIN WITH MINERALS) tablet Take 1 tablet by mouth daily. 120 tablet 2  . Nutritional Supplements (FEEDING SUPPLEMENT, NEPRO CARB STEADY,) LIQD Take 237 mLs by mouth 3 (three) times daily between meals. 237 mL 0  .  feeding supplement, ENSURE ENLIVE, (ENSURE ENLIVE) LIQD Take 237 mLs by mouth 3 (three) times daily between meals. (Patient not taking: Reported on 08/21/2019) 237 mL 12     Discharge Medications: Please see discharge summary for a list of discharge medications.  Relevant Imaging Results:  Relevant Lab Results:   Additional Information SS# [id number] [address] Rexene Alberts, RN

## 2019-08-27 NOTE — Progress Notes (Signed)
TOC CM contacted [facility] re[facility] to review clinical. Spoke to admissions coordinator, Narda Rutherford. Waiting call back for acceptance. [place], [place] ED TOC CM [phone]

## 2019-08-27 NOTE — Progress Notes (Signed)
Physical Therapy Treatment Patient Details Name: Antonio [name] MRN: [id number] DOB: Sep 07, 1945 Today's Date: 08/27/2019    History of Present Illness 74 y.o. male with a past medical history of COPD, alcohol abuse, tobacco abuse with recent admission from 07/27/2019-08/20/19 for respiratory failure secondary to COPD and CHF, hypernatremia, metabolic encephalopathy secondary to critical illness presenting to the ED from nursing facility for altered mental status.    PT Comments    Pt seated upright EOB Indep with safety sitter outside door.  Pt having " a good day today".  Assisted to bathroom to perform a funactional task.  "Let's go wash your hands".  Pt was able to amb without any AD.  Present with good cognition and awareness of his current environment.  Amb in hallway a limited distance due to fatigue and mild dyspnea.  Required one seated rest break.  Returned to room and required another seated rest break.  Pt stated "I want to go home" however he lives by himself.  Stated he has 2 "boys" that live "a county over".    Follow Up Recommendations  SNF;Home health PT;Supervision for mobility/OOB     Equipment Recommendations  3in1 (PT)    Recommendations for Other Services       Precautions / Restrictions Precautions Precautions: Fall Restrictions Weight Bearing Restrictions: No    Mobility  Bed Mobility               General bed mobility comments: sitting EOB on arival  Transfers Overall transfer level: Needs assistance Equipment used: None Transfers: Sit to/from Stand Sit to Stand: Min guard;Min assist         General transfer comment: hand held assist  Ambulation/Gait Ambulation/Gait assistance: Supervision;Min guard Gait Distance (Feet): 22 Feet Assistive device: None;1 person hand held assist Gait Pattern/deviations: Step-through pattern;Decreased stride length Gait velocity: decreased   General Gait Details: limited distance due to fatigue   Stairs              Wheelchair Mobility    Modified Rankin (Stroke Patients Only)       Balance                                            Cognition Arousal/Alertness: Awake/alert Behavior During Therapy: WFL for tasks assessed/performed Overall Cognitive Status: Within Functional Limits for tasks assessed                                 General Comments: pleasant and cooperative today      Exercises      General Comments        Pertinent Vitals/Pain Faces Pain Scale: Hurts little more Pain Location: back Pain Descriptors / Indicators: Sore;Aching Pain Intervention(s): Monitored during session    Home Living                      Prior Function            PT Goals (current goals can now be found in the care plan section) Progress towards PT goals: Progressing toward goals    Frequency    Min 2X/week      PT Plan Current plan remains appropriate    Co-evaluation              AM-PAC PT "6 Clicks"  Mobility   Outcome Measure  Help needed turning from your back to your side while in a flat bed without using bedrails?: A Little Help needed moving from lying on your back to sitting on the side of a flat bed without using bedrails?: A Little Help needed moving to and from a bed to a chair (including a wheelchair)?: A Little Help needed standing up from a chair using your arms (e.g., wheelchair or bedside chair)?: A Little Help needed to walk in hospital room?: A Little Help needed climbing 3-5 steps with a railing? : A Lot 6 Click Score: 17    End of Session Equipment Utilized During Treatment: Gait belt Activity Tolerance: Patient tolerated treatment well Patient left: in bed;with call bell/phone within reach;with bed alarm set;Other (comment) Nurse Communication: Mobility status;Precautions PT Visit Diagnosis: Difficulty in walking, not elsewhere classified (R26.2);Muscle weakness (generalized) (M62.81)      Time: 1500-1510 PT Time Calculation (min) (ACUTE ONLY): 10 min  Charges:  $Gait Training: 8-22 mins                     Rica Koyanagi  PTA Acute  Rehabilitation Services  Pager      [phone] Office      [phone]

## 2019-08-27 NOTE — Progress Notes (Signed)
   TOC CM spoke to pt and states he is patiently waiting for a disposition. Pt states he has been getting up using his walker to ambulate in room. He prefers to go back to SNF rehab and complete his therapy. Gave permission to speak to son, Edd Arbour and fax FL2 and progress notes to [facility]. TOC CM reached out to son and he provided info on [facility] # [phone] [id number]. Explained to son that ALF is an out of pocket expense or covered by Medicaid. States he has been getting all necessary documents together to apply for Medicaid. He wants his father/pt to continue his rehab at a SNF but is disheartened because he knows his Dad is kind, independent and works hard and none of the SNF will give him a chance to complete his therapy based on what one facility stated about him. Explained pt has been doing well, he is able to eat, and pt feels proud he is able to ambulate with his walker. Contacted [facility] and requested CM fax progress notes and FL2. Will follow up with [facility] Director, Waynard Edwards [phone] for review and possible bed offer at ALF. [place], [place] ED TOC CM [phone]

## 2019-08-27 NOTE — Progress Notes (Signed)
CSW spoke with Bryson Ha at [facility] who reports [place] and [facility] have declined patient due to behaviors.   CSW spoke with Tiffany at [facility] who reports their DON is looking at patient and she is unsure when he will be able to give her a response.   CSW spoke with Freda Munro at [facility] and [place] awaiting a call back if they can extend a bed offer.   CSW will continue to follow up.  Golden Circle, LCSW Transitions of Care Department [facility] ED [phone]

## 2019-08-27 NOTE — Progress Notes (Signed)
Received Antonio [name] awake in his room with the sitter at the bedside. He is OOB to the bathroom with his walker. He was compliant with his hs medications. He woke up at 0300 hrs and was awake for the remainder of the morning.

## 2019-08-27 NOTE — ED Notes (Signed)
Pt interested in getting his strength back and is using his walker around his room. Has good boundaries and is pleasant.

## 2019-08-28 LAB — RESPIRATORY PANEL BY RT PCR (FLU A&B, COVID)
Influenza A by PCR: NEGATIVE
Influenza B by PCR: NEGATIVE
SARS Coronavirus 2 by RT PCR: NEGATIVE

## 2019-08-28 NOTE — Progress Notes (Signed)
Received Antonio [name] this PM awake in his room. He is using his walker for assistance to the bathroom. He is OOB at intervals ambulating in his room. He slept throughout the night until 0430 hrs this AM.

## 2019-08-28 NOTE — Progress Notes (Addendum)
TOC CM received call back from Waynard Edwards from [facility], they are unable to offer a bed for pt, he needs more independence at night as they do not have a caregiver 24 hours and more therapy. Contacted [facility], [place] and referral was received. Will review for SNF rehab. Jonnie Finner RN CCM, [facility] ED TOC CM [phone]  4:42 pm Pt had facetime visit with [facility] in [place] for possible placement for rehab. Waiting call back.  [place], [place] ED TOC CM [phone]

## 2019-08-28 NOTE — Progress Notes (Signed)
CSW received a call from pt's son Edd Arbour and called back to [phone] and left a HIPPA-compliant VM.  CSW will continue to follow for D/C needs.  Alphonse Guild. Asees Manfredi, LCSW, LCAS, CSI Transitions of Care Clinical Social Worker Care Coordination Department Ph: [phone]

## 2019-08-28 NOTE — ED Provider Notes (Signed)
Today's Vitals   08/27/19 1826 08/27/19 1930 08/28/19 0600 08/28/19 1320  BP: 136/66  (!) 141/53 (!) 167/67  Pulse: 94  91 90  Resp: 16  20 16   Temp: 98.8 F (37.1 C)  98 F (36.7 C) 98.7 F (37.1 C)  TempSrc: Oral  Oral Oral  SpO2: 100%  96% 100%  Weight:      PainSc:  0-No pain     Body mass index is 35.97 kg/m.   In short, Antonio [name] is a 74 y.o. male with a chief complaint of Altered Mental Status .  Refer to the original H&P for additional details.  ALF likely tomorrow. [facility] reported they would take the pt but want a STR.   Blanchie Dessert, MD 08/28/19 1529

## 2019-08-28 NOTE — Progress Notes (Signed)
CSW contacted Waynard Edwards to follow up on referral information that was sent by RN CM Jonnie Finner. CSW spoke with front Conservator, museum/gallery who reports Rise Paganini is not in yet, but she has received the information via fax and will give it to Ama once she gets in. CSW will continue to follow up.   [place] Circle, LCSW Transitions of Care Department [facility] ED [phone]

## 2019-08-28 NOTE — ED Notes (Signed)
Patient is pleasant and cooperative.  He was able to bathe by himself.  Up in room without difficulty.  No complaints voiced.

## 2019-08-28 NOTE — Progress Notes (Signed)
CSW spoke with Rise Paganini at [facility] who reports she would be able to take patient, but would like patient to get STR before taking him in. She reports she has spoken with [facility] to consider taking him. Contact person for [facility] is Army Melia (office# [phone] and cell# [phone]). CSW to follow up with Otila Kluver regarding STR for patient. Rise Paganini reports she will follow up with the son regarding this progress.  Golden Circle, LCSW Transitions of Care Department [facility] ED [phone]

## 2019-08-29 MED ORDER — MIRTAZAPINE 15 MG PO TABS: 15.0000 mg | ORAL_TABLET | Freq: Every day | ORAL | 0 refills | Status: DC

## 2019-08-29 MED ORDER — AMLODIPINE BESYLATE 10 MG PO TABS: 10.0000 mg | ORAL_TABLET | Freq: Every day | ORAL | 0 refills | Status: DC

## 2019-08-29 MED ORDER — MULTI-VITAMIN/MINERALS PO TABS: 1.0000 | ORAL_TABLET | Freq: Every day | ORAL | 2 refills | Status: DC

## 2019-08-29 MED ORDER — FAMOTIDINE 20 MG PO TABS: 20.0000 mg | ORAL_TABLET | Freq: Every day | ORAL | 0 refills | Status: DC

## 2019-08-29 MED ORDER — NEPRO/CARBSTEADY PO LIQD: 237.0000 mL | Freq: Three times a day (TID) | ORAL | 0 refills | Status: DC

## 2019-08-29 NOTE — ED Notes (Signed)
Pt off unit to facility per provider. Pt alert, calm, cooperative, no s/s of distress. DC information given to PTar transport. Belongings given to Ptar. Pt off unit on stretcher. Pt transported by [facility]

## 2019-08-29 NOTE — Progress Notes (Signed)
CSW contacted Army Melia with [facility] to follow up on bed offer for patient. Otila Kluver reports the DON in currently in a meeting and will follow up with CSW once she is able to speak with them for a decision.   Golden Circle, LCSW Transitions of Care Department [facility] ED [phone]

## 2019-08-29 NOTE — ED Provider Notes (Signed)
Patient is awake eating breakfast this morning in no acute distress.  Vitals remained stable.  Patient was declined from the initial facility that had potentially accepted him and felt that he needed higher level of care.  Skilled facility rehab is being sought.  No specific disposition at this time.  Transitional care team continues to work on this issue.  Today's Vitals   08/28/19 1930 08/28/19 2130 08/29/19 0832 08/29/19 0840  BP:  (!) 125/54  (!) 146/59  Pulse:  68  (!) 102  Resp:  20  19  Temp:  99.3 F (37.4 C)  98.2 F (36.8 C)  TempSrc:  Oral  Oral  SpO2:  95%  98%  Weight:      PainSc: 0-No pain  0-No pain    Body mass index is 35.97 kg/m.    Blanchie Dessert, MD 08/29/19 [phone]

## 2019-08-29 NOTE — Progress Notes (Signed)
CSW received call from [place] with [facility] who reports they can extend a bed offer and patient can come today. Patient will be going to room 701 and number for report is [phone] (ask to speak to [place]). CSW made EDP and RN aware. Patient can be transported via [facility] [place].   Golden Circle, LCSW Transitions of Care Department [facility] ED [phone]

## 2019-08-29 NOTE — ED Notes (Signed)
Pt A and O x2. Pt calm, cooperative, no s/s of distress. Medication compliant. Pt able to ambulate and uses walker.

## 2020-07-15 ENCOUNTER — Emergency Department: Payer: Medicare Other

## 2020-07-15 ENCOUNTER — Inpatient Hospital Stay
Admission: EM | Admit: 2020-07-15 | Discharge: 2020-08-19 | DRG: 207 | Disposition: E | Payer: Medicare Other | Attending: Internal Medicine | Admitting: Internal Medicine

## 2020-07-15 DIAGNOSIS — I447 Left bundle-branch block, unspecified: Secondary | ICD-10-CM | POA: Diagnosis present

## 2020-07-15 DIAGNOSIS — I63431 Cerebral infarction due to embolism of right posterior cerebral artery: Secondary | ICD-10-CM | POA: Diagnosis not present

## 2020-07-15 DIAGNOSIS — R0602 Shortness of breath: Secondary | ICD-10-CM

## 2020-07-15 DIAGNOSIS — I959 Hypotension, unspecified: Secondary | ICD-10-CM | POA: Diagnosis not present

## 2020-07-15 DIAGNOSIS — F1010 Alcohol abuse, uncomplicated: Secondary | ICD-10-CM | POA: Diagnosis present

## 2020-07-15 DIAGNOSIS — R0902 Hypoxemia: Secondary | ICD-10-CM

## 2020-07-15 DIAGNOSIS — J9621 Acute and chronic respiratory failure with hypoxia: Secondary | ICD-10-CM | POA: Diagnosis not present

## 2020-07-15 DIAGNOSIS — Z8673 Personal history of transient ischemic attack (TIA), and cerebral infarction without residual deficits: Secondary | ICD-10-CM

## 2020-07-15 DIAGNOSIS — T50916A Underdosing of multiple unspecified drugs, medicaments and biological substances, initial encounter: Secondary | ICD-10-CM | POA: Diagnosis present

## 2020-07-15 DIAGNOSIS — I472 Ventricular tachycardia: Secondary | ICD-10-CM | POA: Diagnosis present

## 2020-07-15 DIAGNOSIS — I468 Cardiac arrest due to other underlying condition: Secondary | ICD-10-CM | POA: Diagnosis present

## 2020-07-15 DIAGNOSIS — R609 Edema, unspecified: Secondary | ICD-10-CM

## 2020-07-15 DIAGNOSIS — J9601 Acute respiratory failure with hypoxia: Secondary | ICD-10-CM

## 2020-07-15 DIAGNOSIS — J810 Acute pulmonary edema: Secondary | ICD-10-CM

## 2020-07-15 DIAGNOSIS — Z515 Encounter for palliative care: Secondary | ICD-10-CM

## 2020-07-15 DIAGNOSIS — N280 Ischemia and infarction of kidney: Secondary | ICD-10-CM | POA: Diagnosis present

## 2020-07-15 DIAGNOSIS — R4701 Aphasia: Secondary | ICD-10-CM | POA: Diagnosis present

## 2020-07-15 DIAGNOSIS — I63411 Cerebral infarction due to embolism of right middle cerebral artery: Secondary | ICD-10-CM | POA: Diagnosis not present

## 2020-07-15 DIAGNOSIS — I469 Cardiac arrest, cause unspecified: Secondary | ICD-10-CM

## 2020-07-15 DIAGNOSIS — N170 Acute kidney failure with tubular necrosis: Secondary | ICD-10-CM | POA: Diagnosis present

## 2020-07-15 DIAGNOSIS — I21A1 Myocardial infarction type 2: Secondary | ICD-10-CM | POA: Diagnosis present

## 2020-07-15 DIAGNOSIS — Z20822 Contact with and (suspected) exposure to covid-19: Secondary | ICD-10-CM | POA: Diagnosis present

## 2020-07-15 DIAGNOSIS — R001 Bradycardia, unspecified: Secondary | ICD-10-CM | POA: Diagnosis not present

## 2020-07-15 DIAGNOSIS — R34 Anuria and oliguria: Secondary | ICD-10-CM | POA: Diagnosis not present

## 2020-07-15 DIAGNOSIS — J441 Chronic obstructive pulmonary disease with (acute) exacerbation: Secondary | ICD-10-CM | POA: Diagnosis present

## 2020-07-15 DIAGNOSIS — G931 Anoxic brain damage, not elsewhere classified: Secondary | ICD-10-CM | POA: Diagnosis present

## 2020-07-15 DIAGNOSIS — R29725 NIHSS score 25: Secondary | ICD-10-CM | POA: Diagnosis not present

## 2020-07-15 DIAGNOSIS — G4733 Obstructive sleep apnea (adult) (pediatric): Secondary | ICD-10-CM | POA: Diagnosis present

## 2020-07-15 DIAGNOSIS — I5033 Acute on chronic diastolic (congestive) heart failure: Secondary | ICD-10-CM | POA: Diagnosis present

## 2020-07-15 DIAGNOSIS — Z6841 Body Mass Index (BMI) 40.0 and over, adult: Secondary | ICD-10-CM

## 2020-07-15 DIAGNOSIS — Z91138 Patient's unintentional underdosing of medication regimen for other reason: Secondary | ICD-10-CM

## 2020-07-15 DIAGNOSIS — F17210 Nicotine dependence, cigarettes, uncomplicated: Secondary | ICD-10-CM | POA: Diagnosis present

## 2020-07-15 DIAGNOSIS — I110 Hypertensive heart disease with heart failure: Secondary | ICD-10-CM | POA: Diagnosis present

## 2020-07-15 DIAGNOSIS — Z66 Do not resuscitate: Secondary | ICD-10-CM | POA: Diagnosis not present

## 2020-07-15 DIAGNOSIS — J9622 Acute and chronic respiratory failure with hypercapnia: Secondary | ICD-10-CM | POA: Diagnosis present

## 2020-07-15 LAB — COMPREHENSIVE METABOLIC PANEL
ALT: 35 U/L (ref 0–44)
AST: 55 U/L — ABNORMAL HIGH (ref 15–41)
Albumin: 3.4 g/dL — ABNORMAL LOW (ref 3.5–5.0)
Alkaline Phosphatase: 75 U/L (ref 38–126)
Anion gap: 13 (ref 5–15)
BUN: 15 mg/dL (ref 8–23)
CO2: 22 mmol/L (ref 22–32)
Calcium: 8 mg/dL — ABNORMAL LOW (ref 8.9–10.3)
Chloride: 104 mmol/L (ref 98–111)
Creatinine, Ser: 1.62 mg/dL — ABNORMAL HIGH (ref 0.61–1.24)
GFR, Estimated: 44 mL/min — ABNORMAL LOW (ref >60)
Glucose, Bld: 312 mg/dL — ABNORMAL HIGH (ref 70–99)
Potassium: 3.9 mmol/L (ref 3.5–5.1)
Sodium: 139 mmol/L (ref 135–145)
Total Bilirubin: 0.8 mg/dL (ref 0.3–1.2)
Total Protein: 7 g/dL (ref 6.5–8.1)

## 2020-07-15 LAB — CBC WITH DIFFERENTIAL/PLATELET
Abs Immature Granulocytes: 1.32 10*3/uL — ABNORMAL HIGH (ref 0.00–0.07)
Basophils Absolute: 0.2 10*3/uL — ABNORMAL HIGH (ref 0.0–0.1)
Basophils Relative: 1 %
Eosinophils Absolute: 0.6 10*3/uL — ABNORMAL HIGH (ref 0.0–0.5)
Eosinophils Relative: 3 %
HCT: 44.8 % (ref 39.0–52.0)
Hemoglobin: 14.1 g/dL (ref 13.0–17.0)
Immature Granulocytes: 6 %
Lymphocytes Relative: 36 %
Lymphs Abs: 7.9 10*3/uL — ABNORMAL HIGH (ref 0.7–4.0)
MCH: 32.2 pg (ref 26.0–34.0)
MCHC: 31.5 g/dL (ref 30.0–36.0)
MCV: 102.3 fL — ABNORMAL HIGH (ref 80.0–100.0)
Monocytes Absolute: 0.6 10*3/uL (ref 0.1–1.0)
Monocytes Relative: 3 %
Neutro Abs: 11.1 10*3/uL — ABNORMAL HIGH (ref 1.7–7.7)
Neutrophils Relative %: 51 %
Platelets: 323 10*3/uL (ref 150–400)
RBC: 4.38 MIL/uL (ref 4.22–5.81)
RDW: 13.7 % (ref 11.5–15.5)
Smear Review: NORMAL
WBC: 21.7 10*3/uL — ABNORMAL HIGH (ref 4.0–10.5)
nRBC: 0 % (ref 0.0–0.2)

## 2020-07-15 LAB — RESP PANEL BY RT-PCR (FLU A&B, COVID) ARPGX2
Influenza A by PCR: NEGATIVE
Influenza B by PCR: NEGATIVE
SARS Coronavirus 2 by RT PCR: NEGATIVE

## 2020-07-15 LAB — LACTIC ACID, PLASMA
Lactic Acid, Venous: 5.4 mmol/L (ref 0.5–1.9)
Lactic Acid, Venous: 2.1 mmol/L (ref 0.5–1.9)

## 2020-07-15 LAB — TROPONIN I (HIGH SENSITIVITY)
Troponin I (High Sensitivity): 162 ng/L (ref <18)
Troponin I (High Sensitivity): 297 ng/L (ref <18)

## 2020-07-15 LAB — URINALYSIS, COMPLETE (UACMP) WITH MICROSCOPIC
Bacteria, UA: NONE SEEN
Bilirubin Urine: NEGATIVE
Glucose, UA: 150 mg/dL — AB
Ketones, ur: NEGATIVE mg/dL
Leukocytes,Ua: NEGATIVE
Nitrite: NEGATIVE
Protein, ur: >=300 mg/dL — AB
RBC / HPF: >50 RBC/hpf — ABNORMAL HIGH (ref 0–5)
Specific Gravity, Urine: 1.014 (ref 1.005–1.030)
pH: 6 (ref 5.0–8.0)

## 2020-07-15 LAB — BRAIN NATRIURETIC PEPTIDE: B Natriuretic Peptide: 562.8 pg/mL — ABNORMAL HIGH (ref 0.0–100.0)

## 2020-07-15 LAB — MAGNESIUM: Magnesium: 2.4 mg/dL (ref 1.7–2.4)

## 2020-07-15 IMAGING — DX DG CHEST 1V PORT
1 series · 1 of 1 positions shown · non-contrast
Comparison: Chest x-ray [DATE]

CLINICAL DATA: Intubation

EXAM:
PORTABLE CHEST 1 VIEW

[chest ap]
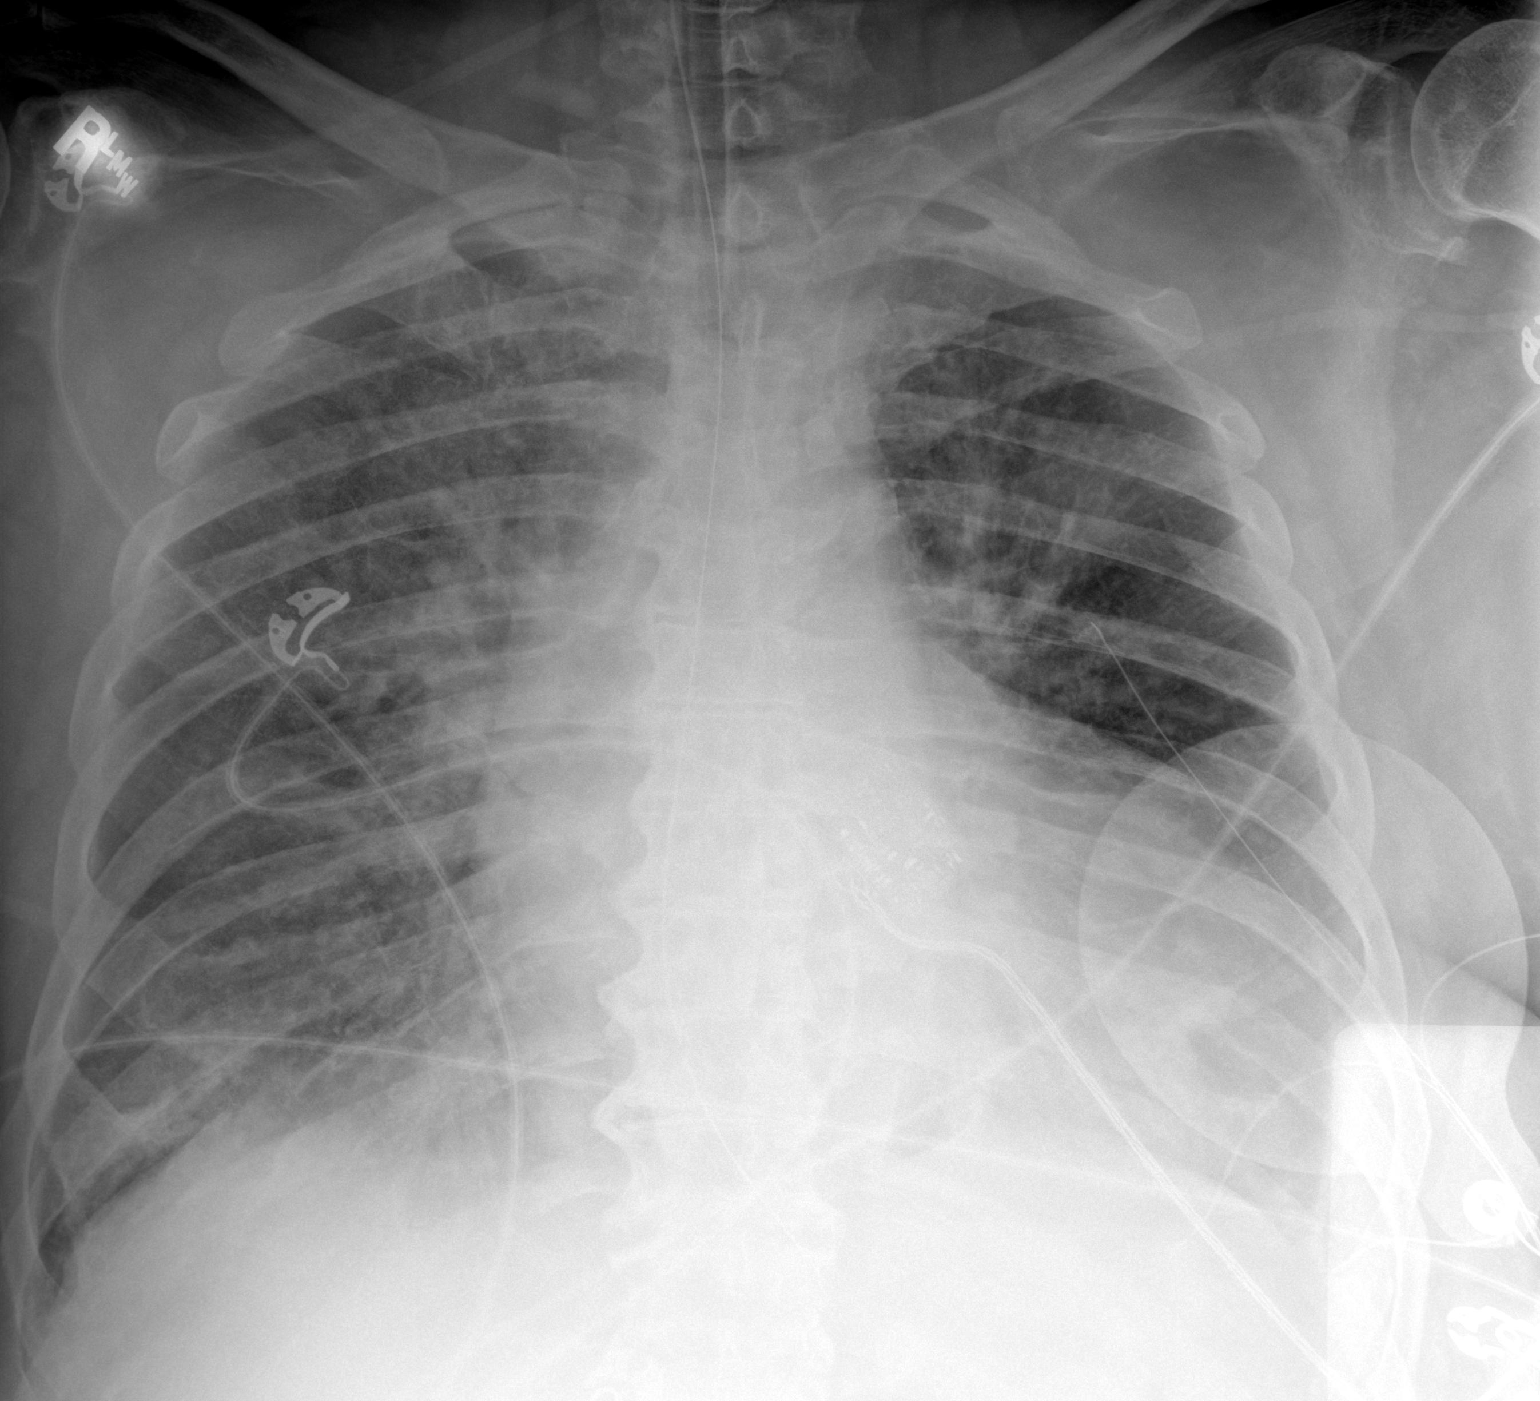

[1 of 1 positions shown; findings below may reference images not displayed]

FINDINGS: Interval placement of an endotracheal tube with tip 7 cm above the
carina. Enteric tube with tip overlying the expected region of the
gastroesophageal junction and side port overlying the expected
region of distal esophagus. Cardiac paddles overlie the chest.

The heart size and mediastinal contours are unchanged with
cardiomegaly.

Increased interstitial markings within bilateral upper lobes.
Diffuse vague patchy airspace opacities that are more prominent by
within the bilateral lower lobes. No pleural effusion. No
pneumothorax.

No acute osseous abnormality.
IMPRESSION: 1. Endotracheal tube with tip 7 cm above the carina.
2. Enteric tube with tip overlying the expected region of the
gastroesophageal junction and side port the expected region of
distal esophagus. Recommend advancement by 10 cm.
3. Pulmonary edema.
4. Bilateral lower lobe patchy airspace opacities could represent
infection/inflammation.

## 2020-07-15 MED ORDER — EPINEPHRINE 1 MG/10ML IJ SOSY
PREFILLED_SYRINGE | INTRAMUSCULAR | Status: AC | PRN
Start: 2020-07-15 — End: 2020-07-15
  Administered 2020-07-15 (×4): 1 via INTRAVENOUS

## 2020-07-15 MED ORDER — FENTANYL CITRATE (PF) 100 MCG/2ML IJ SOLN: 50.0000 ug | INTRAMUSCULAR | Status: DC | PRN

## 2020-07-15 MED ORDER — PROPOFOL 1000 MG/100ML IV EMUL
0.0000 ug/kg/min | INTRAVENOUS | Status: DC
  Administered 2020-07-15: 21:00:00 5 ug/kg/min via INTRAVENOUS

## 2020-07-15 MED ORDER — MIDAZOLAM HCL 2 MG/2ML IJ SOLN
2.0000 mg | Freq: Once | INTRAMUSCULAR | Status: AC
  Administered 2020-07-15: 21:00:00 2 mg via INTRAVENOUS

## 2020-07-15 MED ORDER — AMIODARONE LOAD VIA INFUSION
150.0000 mg | Freq: Once | INTRAVENOUS | Status: AC
  Administered 2020-07-15: 21:00:00 150 mg via INTRAVENOUS
  Filled 2020-07-15: qty 83.34

## 2020-07-15 MED ORDER — MIDAZOLAM HCL 2 MG/2ML IJ SOLN
INTRAMUSCULAR | Status: AC
  Administered 2020-07-15: 22:00:00 2 mg
  Filled 2020-07-15: qty 2

## 2020-07-15 MED ORDER — FUROSEMIDE 10 MG/ML IJ SOLN
40.0000 mg | Freq: Once | INTRAMUSCULAR | Status: AC
  Administered 2020-07-15: 21:00:00 40 mg via INTRAVENOUS
  Filled 2020-07-15: qty 4

## 2020-07-15 MED ORDER — AMIODARONE HCL IN DEXTROSE 360-4.14 MG/200ML-% IV SOLN
INTRAVENOUS | Status: AC
  Filled 2020-07-15: qty 200

## 2020-07-15 MED ORDER — AMIODARONE HCL IN DEXTROSE 360-4.14 MG/200ML-% IV SOLN
30.0000 mg/h | INTRAVENOUS | Status: DC
  Administered 2020-07-16 (×2): 30 mg/h via INTRAVENOUS
  Filled 2020-07-15 (×3): qty 200

## 2020-07-15 MED ORDER — AMIODARONE HCL IN DEXTROSE 360-4.14 MG/200ML-% IV SOLN
60.0000 mg/h | INTRAVENOUS | Status: AC
  Administered 2020-07-15 – 2020-07-16 (×2): 60 mg/h via INTRAVENOUS

## 2020-07-15 MED ORDER — PROPOFOL 1000 MG/100ML IV EMUL
INTRAVENOUS | Status: AC
  Filled 2020-07-15: qty 100

## 2020-07-15 NOTE — ED Notes (Signed)
Rhythm and pulse check, still in PEA, CPR resumed

## 2020-07-15 NOTE — ED Notes (Signed)
Zoll pads are placed, rhythm check to find PEA, CPR resumed

## 2020-07-15 NOTE — ED Notes (Signed)
Pt arrive with CPR in process

## 2020-07-15 NOTE — ED Provider Notes (Signed)
[facility] Emergency Department Provider Note   ____________________________________________   Event Date/Time   First MD Initiated Contact with Patient 07/08/2020 2038     (approximate)  I have reviewed the triage vital signs and the nursing notes.   HISTORY  Chief Complaint Cardiac Arrest    HPI Antonio [name] is a 74 y.o. male with past medical history of hypertension, COPD, CHF, and alcohol abuse who presents to the ED in cardiac arrest.  Per EMS, patient initially called out for difficulty breathing and he was found in respiratory distress.  He was placed on CPAP but seemed to worsen during the course of transport, eventually losing consciousness and going into cardiac arrest.  CPR was initiated and patient noted to be in PEA with EMS.        Past Medical History:  Diagnosis Date  . COPD (chronic obstructive pulmonary disease) ([place])   . ETOH abuse   . Smoker     Patient Active Problem List   Diagnosis Date Noted  . Acute diastolic CHF (congestive heart failure) ([place])   . Essential hypertension   . Acute metabolic encephalopathy   . Pulmonary infiltrates   . Pulmonary disease   . Hypernatremia   . Respiratory failure ([place]) 07/28/2019    No past surgical history on file.  Prior to Admission medications   Not on File    Allergies Patient has no known allergies.  No family history on file.  Social History Social History   Tobacco Use  . Smoking status: Current Every Day Smoker    Packs/day: 0.50    Years: 55.00    Pack years: 27.50    Types: Cigarettes  . Smokeless tobacco: Never Used  Vaping Use  . Vaping Use: Never used  Substance Use Topics  . Alcohol use: Never  . Drug use: Never    Review of Systems Unable to obtain secondary to cardiac arrest.  ____________________________________________   PHYSICAL EXAM:  VITAL SIGNS: ED Triage Vitals  Enc Vitals Group     BP      Pulse      Resp      Temp       Temp src      SpO2      Weight      Height      Head Circumference      Peak Flow      Pain Score      Pain Loc      Pain Edu?      Excl. in [place]?     Constitutional: Unresponsive. Eyes: Conjunctivae are normal. Head: Atraumatic. Nose: No congestion/rhinnorhea. Mouth/Throat: Pink frothy secretions spilling out from mouth. Neck: Normal ROM Cardiovascular: Central cyanosis noted, no palpable pulses. Respiratory: Occasional gasping respirations. Gastrointestinal: Soft and not distended. Genitourinary: deferred Musculoskeletal: No lower extremity tenderness nor edema. Neurologic: No spontaneous movements noted. Skin:  Skin is cool and dry. No rash noted. Psychiatric: Unable to assess.  ____________________________________________   LABS (all labs ordered are listed, but only abnormal results are displayed)  Labs Reviewed  CBC WITH DIFFERENTIAL/PLATELET - Abnormal; Notable for the following components:      Result Value   WBC 21.7 (*)    MCV 102.3 (*)    Neutro Abs 11.1 (*)    Lymphs Abs 7.9 (*)    Eosinophils Absolute 0.6 (*)    Basophils Absolute 0.2 (*)    Abs Immature Granulocytes 1.32 (*)    All other components  within normal limits  COMPREHENSIVE METABOLIC PANEL - Abnormal; Notable for the following components:   Glucose, Bld 312 (*)    Creatinine, Ser 1.62 (*)    Calcium 8.0 (*)    Albumin 3.4 (*)    AST 55 (*)    GFR, Estimated 44 (*)    All other components within normal limits  LACTIC ACID, PLASMA - Abnormal; Notable for the following components:   Lactic Acid, Venous 5.4 (*)    All other components within normal limits  LACTIC ACID, PLASMA - Abnormal; Notable for the following components:   Lactic Acid, Venous 2.1 (*)    All other components within normal limits  URINALYSIS, COMPLETE (UACMP) WITH MICROSCOPIC - Abnormal; Notable for the following components:   Color, Urine YELLOW (*)    APPearance CLOUDY (*)    Glucose, UA 150 (*)    Hgb urine dipstick  LARGE (*)    Protein, ur >=300 (*)    RBC / HPF >50 (*)    All other components within normal limits  BRAIN NATRIURETIC PEPTIDE - Abnormal; Notable for the following components:   B Natriuretic Peptide 562.8 (*)    All other components within normal limits  BLOOD GAS, VENOUS - Abnormal; Notable for the following components:   pH, Ven 7.06 (*)    pCO2, Ven 75 (*)    pO2, Ven 121.0 (*)    Acid-base deficit 10.5 (*)    All other components within normal limits  TROPONIN I (HIGH SENSITIVITY) - Abnormal; Notable for the following components:   Troponin I (High Sensitivity) 162 (*)    All other components within normal limits  TROPONIN I (HIGH SENSITIVITY) - Abnormal; Notable for the following components:   Troponin I (High Sensitivity) 297 (*)    All other components within normal limits  RESP PANEL BY RT-PCR (FLU A&B, COVID) ARPGX2  CULTURE, BLOOD (ROUTINE X 2)  CULTURE, BLOOD (ROUTINE X 2)  MAGNESIUM  TRIGLYCERIDES  PATHOLOGIST SMEAR REVIEW   ____________________________________________  EKG  ED ECG REPORT I, Blake Divine, the attending physician, personally viewed and interpreted this ECG.   Date: 07/13/2020  EKG Time: 20:42  Rate: 105  Rhythm: sinus tachycardia  Axis: Normal  Intervals:nonspecific intraventricular conduction delay  ST&T Change: None   PROCEDURES  Procedure(s) performed (including Critical Care):  Procedure Name: Intubation Date/Time: 07/18/2020 11:47 PM Performed by: Blake Divine, MD Pre-anesthesia Checklist: Patient identified, Patient being monitored, Emergency Drugs available, Timeout performed and Suction available Oxygen Delivery Method: Non-rebreather mask Preoxygenation: Pre-oxygenation with 100% oxygen Induction Type: Rapid sequence Ventilation: Mask ventilation without difficulty Laryngoscope Size: Mac and 4 Grade View: Grade II Tube size: 7.5 mm Number of attempts: 1 Airway Equipment and Method: Video-laryngoscopy Placement  Confirmation: ETT inserted through vocal cords under direct vision,  CO2 detector and Breath sounds checked- equal and bilateral Secured at: 23 cm Tube secured with: ETT holder Dental Injury: Teeth and Oropharynx as per pre-operative assessment     .Critical Care Performed by: Blake Divine, MD Authorized by: Blake Divine, MD   Critical care provider statement:    Critical care time (minutes):  75   Critical care time was exclusive of:  Separately billable procedures and treating other patients and teaching time   Critical care was necessary to treat or prevent imminent or life-threatening deterioration of the following conditions:  Respiratory failure, cardiac failure and circulatory failure   Critical care was time spent personally by me on the following activities:  Discussions with consultants,  evaluation of patient's response to treatment, examination of patient, ordering and performing treatments and interventions, ordering and review of laboratory studies, ordering and review of radiographic studies, pulse oximetry, re-evaluation of patient's condition, obtaining history from patient or surrogate and review of old charts   I assumed direction of critical care for this patient from another provider in my specialty: no       ____________________________________________   INITIAL IMPRESSION / ASSESSMENT AND PLAN / ED COURSE       74 year old male with past medical history of hypertension, COPD, CHF, and alcohol abuse who presents to the ED in cardiac arrest after initially calling out for respiratory distress.  Patient with significant pink frothy secretions pooling in his mouth on arrival and overall history concerning for flash pulmonary edema.  CPR was continued for about 15 minutes with multiple rounds of epinephrine, during which time patient was intubated without difficulty.  He remained in PEA throughout but ROSC eventually achieved.  Follow-up chest x-ray showed ET tube  to be high and it was advanced 4 cm.  Images reviewed by me and shows significant pulmonary edema, patient subsequently given IV Lasix.  He did have multiple brief runs of V. tach following ROSC and was started on amiodarone drip.  EKG shows no acute ischemic changes.  I suspect cardiac arrest due to flash pulmonary edema and respiratory failure.  Low suspicion for infectious process, PE, or ACS.  VBG showed respiratory acidosis and patient's rate on vent was increased.  Case discussed with intensivist for admission.      ____________________________________________   FINAL CLINICAL IMPRESSION(S) / ED DIAGNOSES  Final diagnoses:  Cardiac arrest ([place])  Flash pulmonary edema ([place])  Acute respiratory failure with hypoxia and hypercapnia [facility])     ED Discharge Orders    None       Note:  This document was prepared using Dragon voice recognition software and may include unintentional dictation errors.   Blake Divine, MD 06/25/2020 2350

## 2020-07-15 NOTE — ED Notes (Signed)
Intensivist at bedside for evaluation 

## 2020-07-15 NOTE — ED Triage Notes (Signed)
EMS reports pt brought in for resp distress, was on bipap on initial call to [facility] but arrived in full cardiac arrest with CPR in process

## 2020-07-15 NOTE — ED Notes (Signed)
This RN took primary role of pt. Previous RN has called report to the ICU. This RN is just waiting for the admit order so pt can be moved to the ICU. Pt noted to on cardiac, bp, pulse ox monitor and zoll. Pt noted to have foley catheter, draining to gravity and pt noted to be intubated. Family at bedside.

## 2020-07-15 NOTE — ED Notes (Signed)
Rhythm check, still in PEA, CPR resumed

## 2020-07-15 NOTE — ED Notes (Signed)
ROSC, sinus tachy 155bpm

## 2020-07-15 NOTE — ED Notes (Signed)
RN unable to assess peripheral pulses. Right carotid palpable, but difficult to locate due to pts anatomy. OG tube noted to be out. RN to replace. Respiratory was just at bedside doing deep suction on pt.

## 2020-07-15 NOTE — Progress Notes (Signed)
Upon arrival to hospital, provided emotional and spiritual support to sons and daughter-in-law. Family is working well together and were in this situation a year ago with patient. They feel they know their fathers wishes. Chaplain continues to be available for support upon request.     08/06/20 2100  Clinical Encounter Type  Visited With Patient and family together  Visit Type Initial;ED  Referral From Nurse  Spiritual Encounters  Spiritual Needs Emotional

## 2020-07-16 ENCOUNTER — Inpatient Hospital Stay: Payer: Medicare Other

## 2020-07-16 ENCOUNTER — Inpatient Hospital Stay
Admit: 2020-07-16 | Discharge: 2020-07-16 | Disposition: A | Discharge status: Home / Self Care | Payer: Medicare Other | Attending: Pulmonary Disease | Admitting: Pulmonary Disease

## 2020-07-16 ENCOUNTER — Emergency Department: Payer: Medicare Other

## 2020-07-16 DIAGNOSIS — I63431 Cerebral infarction due to embolism of right posterior cerebral artery: Secondary | ICD-10-CM | POA: Diagnosis not present

## 2020-07-16 DIAGNOSIS — I469 Cardiac arrest, cause unspecified: Secondary | ICD-10-CM | POA: Insufficient documentation

## 2020-07-16 DIAGNOSIS — R34 Anuria and oliguria: Secondary | ICD-10-CM | POA: Diagnosis not present

## 2020-07-16 DIAGNOSIS — J9621 Acute and chronic respiratory failure with hypoxia: Secondary | ICD-10-CM | POA: Diagnosis present

## 2020-07-16 DIAGNOSIS — Z515 Encounter for palliative care: Secondary | ICD-10-CM | POA: Diagnosis not present

## 2020-07-16 DIAGNOSIS — F1010 Alcohol abuse, uncomplicated: Secondary | ICD-10-CM | POA: Diagnosis present

## 2020-07-16 DIAGNOSIS — I959 Hypotension, unspecified: Secondary | ICD-10-CM | POA: Diagnosis not present

## 2020-07-16 DIAGNOSIS — J9622 Acute and chronic respiratory failure with hypercapnia: Secondary | ICD-10-CM | POA: Diagnosis present

## 2020-07-16 DIAGNOSIS — Z66 Do not resuscitate: Secondary | ICD-10-CM | POA: Diagnosis not present

## 2020-07-16 DIAGNOSIS — I472 Ventricular tachycardia: Secondary | ICD-10-CM | POA: Diagnosis present

## 2020-07-16 DIAGNOSIS — N280 Ischemia and infarction of kidney: Secondary | ICD-10-CM | POA: Diagnosis present

## 2020-07-16 DIAGNOSIS — I110 Hypertensive heart disease with heart failure: Secondary | ICD-10-CM | POA: Diagnosis present

## 2020-07-16 DIAGNOSIS — I468 Cardiac arrest due to other underlying condition: Secondary | ICD-10-CM | POA: Diagnosis present

## 2020-07-16 DIAGNOSIS — F17210 Nicotine dependence, cigarettes, uncomplicated: Secondary | ICD-10-CM | POA: Diagnosis present

## 2020-07-16 DIAGNOSIS — Z6841 Body Mass Index (BMI) 40.0 and over, adult: Secondary | ICD-10-CM | POA: Diagnosis not present

## 2020-07-16 DIAGNOSIS — R4701 Aphasia: Secondary | ICD-10-CM | POA: Diagnosis present

## 2020-07-16 DIAGNOSIS — Z20822 Contact with and (suspected) exposure to covid-19: Secondary | ICD-10-CM | POA: Diagnosis present

## 2020-07-16 DIAGNOSIS — J441 Chronic obstructive pulmonary disease with (acute) exacerbation: Secondary | ICD-10-CM | POA: Diagnosis present

## 2020-07-16 DIAGNOSIS — I21A1 Myocardial infarction type 2: Secondary | ICD-10-CM | POA: Diagnosis present

## 2020-07-16 DIAGNOSIS — I63411 Cerebral infarction due to embolism of right middle cerebral artery: Secondary | ICD-10-CM | POA: Diagnosis not present

## 2020-07-16 DIAGNOSIS — N170 Acute kidney failure with tubular necrosis: Secondary | ICD-10-CM | POA: Diagnosis present

## 2020-07-16 DIAGNOSIS — G931 Anoxic brain damage, not elsewhere classified: Secondary | ICD-10-CM | POA: Diagnosis present

## 2020-07-16 DIAGNOSIS — J9601 Acute respiratory failure with hypoxia: Secondary | ICD-10-CM

## 2020-07-16 DIAGNOSIS — I5033 Acute on chronic diastolic (congestive) heart failure: Secondary | ICD-10-CM | POA: Diagnosis present

## 2020-07-16 DIAGNOSIS — R29725 NIHSS score 25: Secondary | ICD-10-CM | POA: Diagnosis not present

## 2020-07-16 LAB — MAGNESIUM
Magnesium: 2 mg/dL (ref 1.7–2.4)
Magnesium: 2 mg/dL (ref 1.7–2.4)

## 2020-07-16 LAB — ECHOCARDIOGRAM COMPLETE
AR max vel: 1.18 cm2
AV Area VTI: 0.98 cm2
AV Area mean vel: 1.03 cm2
AV Mean grad: 16 mmHg
AV Peak grad: 32.6 mmHg
Ao pk vel: 2.85 m/s
Area-P 1/2: 4.89 cm2
Calc EF: 61.8 %
Height: 66 in
P 1/2 time: 376 msec
Single Plane A2C EF: 63.5 %
Single Plane A4C EF: 60.3 %
Weight: 4049.41 oz

## 2020-07-16 LAB — TROPONIN I (HIGH SENSITIVITY)
Troponin I (High Sensitivity): 1455 ng/L (ref <18)
Troponin I (High Sensitivity): 1380 ng/L (ref <18)

## 2020-07-16 LAB — PROTIME-INR
INR: 1.2 (ref 0.8–1.2)
Prothrombin Time: 14.7 seconds (ref 11.4–15.2)

## 2020-07-16 LAB — PHOSPHORUS
Phosphorus: 3.5 mg/dL (ref 2.5–4.6)
Phosphorus: 5 mg/dL — ABNORMAL HIGH (ref 2.5–4.6)

## 2020-07-16 LAB — COMPREHENSIVE METABOLIC PANEL
ALT: 38 U/L (ref 0–44)
AST: 47 U/L — ABNORMAL HIGH (ref 15–41)
Albumin: 3.4 g/dL — ABNORMAL LOW (ref 3.5–5.0)
Alkaline Phosphatase: 60 U/L (ref 38–126)
Anion gap: 9 (ref 5–15)
BUN: 21 mg/dL (ref 8–23)
CO2: 25 mmol/L (ref 22–32)
Calcium: 8 mg/dL — ABNORMAL LOW (ref 8.9–10.3)
Chloride: 103 mmol/L (ref 98–111)
Creatinine, Ser: 1.62 mg/dL — ABNORMAL HIGH (ref 0.61–1.24)
GFR, Estimated: 44 mL/min — ABNORMAL LOW (ref >60)
Glucose, Bld: 143 mg/dL — ABNORMAL HIGH (ref 70–99)
Potassium: 4.4 mmol/L (ref 3.5–5.1)
Sodium: 137 mmol/L (ref 135–145)
Total Bilirubin: 0.6 mg/dL (ref 0.3–1.2)
Total Protein: 6.8 g/dL (ref 6.5–8.1)

## 2020-07-16 LAB — CBC WITH DIFFERENTIAL/PLATELET
Abs Immature Granulocytes: 0.14 10*3/uL — ABNORMAL HIGH (ref 0.00–0.07)
Basophils Absolute: 0 10*3/uL (ref 0.0–0.1)
Basophils Relative: 0 %
Eosinophils Absolute: 0 10*3/uL (ref 0.0–0.5)
Eosinophils Relative: 0 %
HCT: 42.9 % (ref 39.0–52.0)
Hemoglobin: 14.4 g/dL (ref 13.0–17.0)
Immature Granulocytes: 1 %
Lymphocytes Relative: 6 %
Lymphs Abs: 0.9 10*3/uL (ref 0.7–4.0)
MCH: 32.9 pg (ref 26.0–34.0)
MCHC: 33.6 g/dL (ref 30.0–36.0)
MCV: 97.9 fL (ref 80.0–100.0)
Monocytes Absolute: 0.7 10*3/uL (ref 0.1–1.0)
Monocytes Relative: 5 %
Neutro Abs: 12.5 10*3/uL — ABNORMAL HIGH (ref 1.7–7.7)
Neutrophils Relative %: 88 %
Platelets: 284 10*3/uL (ref 150–400)
RBC: 4.38 MIL/uL (ref 4.22–5.81)
RDW: 14.1 % (ref 11.5–15.5)
WBC: 14.3 10*3/uL — ABNORMAL HIGH (ref 4.0–10.5)
nRBC: 0 % (ref 0.0–0.2)

## 2020-07-16 LAB — GLUCOSE, CAPILLARY
Glucose-Capillary: 126 mg/dL — ABNORMAL HIGH (ref 70–99)
Glucose-Capillary: 120 mg/dL — ABNORMAL HIGH (ref 70–99)
Glucose-Capillary: 119 mg/dL — ABNORMAL HIGH (ref 70–99)
Glucose-Capillary: 123 mg/dL — ABNORMAL HIGH (ref 70–99)
Glucose-Capillary: 118 mg/dL — ABNORMAL HIGH (ref 70–99)

## 2020-07-16 LAB — LACTIC ACID, PLASMA
Lactic Acid, Venous: 1.1 mmol/L (ref 0.5–1.9)
Lactic Acid, Venous: 1.1 mmol/L (ref 0.5–1.9)

## 2020-07-16 LAB — HEPARIN LEVEL (UNFRACTIONATED)
Heparin Unfractionated: 0.52 IU/mL (ref 0.30–0.70)
Heparin Unfractionated: 0.66 IU/mL (ref 0.30–0.70)

## 2020-07-16 LAB — BASIC METABOLIC PANEL
Anion gap: 9 (ref 5–15)
BUN: 26 mg/dL — ABNORMAL HIGH (ref 8–23)
CO2: 24 mmol/L (ref 22–32)
Calcium: 7.9 mg/dL — ABNORMAL LOW (ref 8.9–10.3)
Chloride: 104 mmol/L (ref 98–111)
Creatinine, Ser: 2.03 mg/dL — ABNORMAL HIGH (ref 0.61–1.24)
GFR, Estimated: 34 mL/min — ABNORMAL LOW (ref >60)
Glucose, Bld: 120 mg/dL — ABNORMAL HIGH (ref 70–99)
Potassium: 4.9 mmol/L (ref 3.5–5.1)
Sodium: 137 mmol/L (ref 135–145)

## 2020-07-16 LAB — MRSA PCR SCREENING: MRSA by PCR: POSITIVE — AB

## 2020-07-16 LAB — TRIGLYCERIDES
Triglycerides: 70 mg/dL (ref <150)
Triglycerides: 42 mg/dL (ref <150)

## 2020-07-16 LAB — BRAIN NATRIURETIC PEPTIDE: B Natriuretic Peptide: 1001.1 pg/mL — ABNORMAL HIGH (ref 0.0–100.0)

## 2020-07-16 LAB — PATHOLOGIST SMEAR REVIEW

## 2020-07-16 LAB — APTT: aPTT: 58 seconds — ABNORMAL HIGH (ref 24–36)

## 2020-07-16 LAB — PROCALCITONIN: Procalcitonin: 5.62 ng/mL

## 2020-07-16 IMAGING — DX DG CHEST 1V PORT
1 series · 1 of 1 positions shown · non-contrast
Comparison: [DATE]

CLINICAL DATA: Shortness of breath, cardiac arrest

EXAM:
PORTABLE CHEST 1 VIEW

[chest ap]
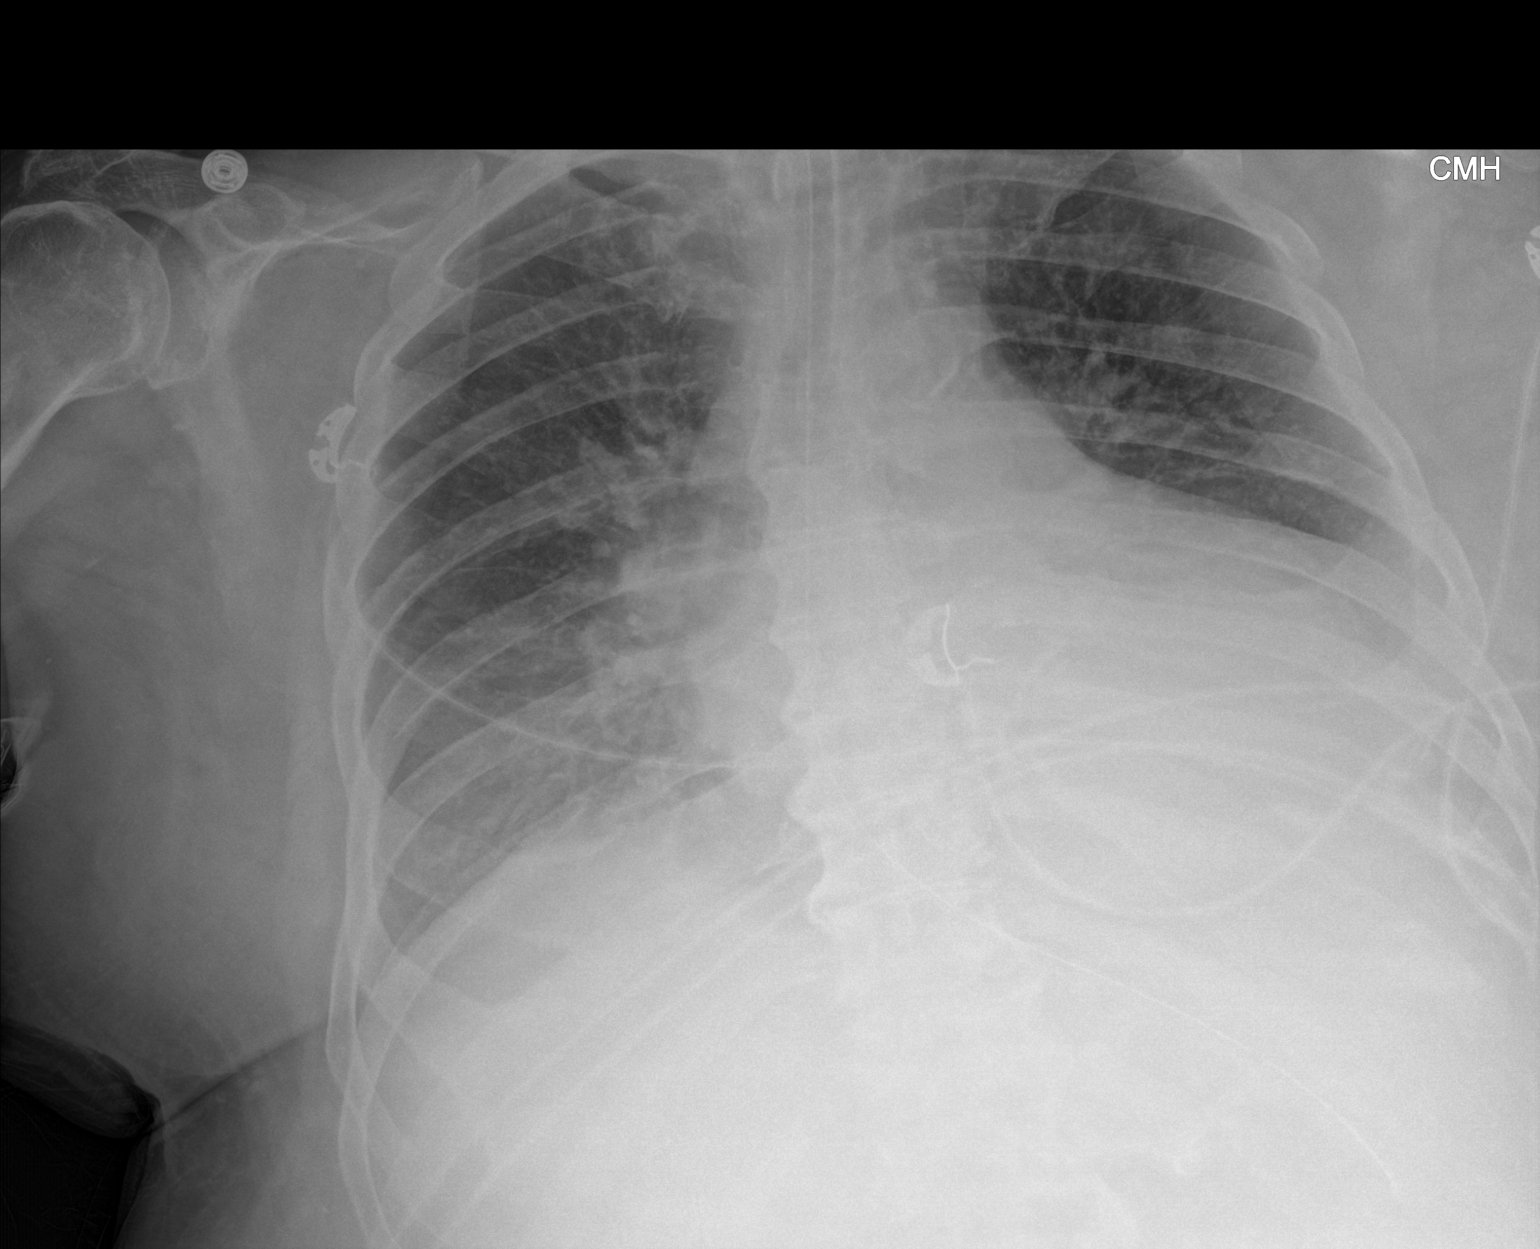

[1 of 1 positions shown; findings below may reference images not displayed]

FINDINGS: Mild bibasilar opacities, likely reflecting small bilateral pleural
effusions, right greater than left. Associated right lower lobe
atelectasis. No frank interstitial edema. No pneumothorax.

Endotracheal tube terminates 15 mm above the carina.

Enteric tube courses into the mid stomach.

The heart is normal in size.
IMPRESSION: Small bilateral pleural effusions, right greater than left.
Associated right lower lobe atelectasis.

Endotracheal tube terminates 15 mm above the carina. Enteric tube
courses into the mid stomach.

## 2020-07-16 IMAGING — US US EXTREM LOW VENOUS
1 series · 13 of 24 positions shown · non-contrast
Comparison: None.

CLINICAL DATA: 74-year-old male with history of swelling



[Series 1: lev · 13 of 86 slices shown]
[im 1/86]
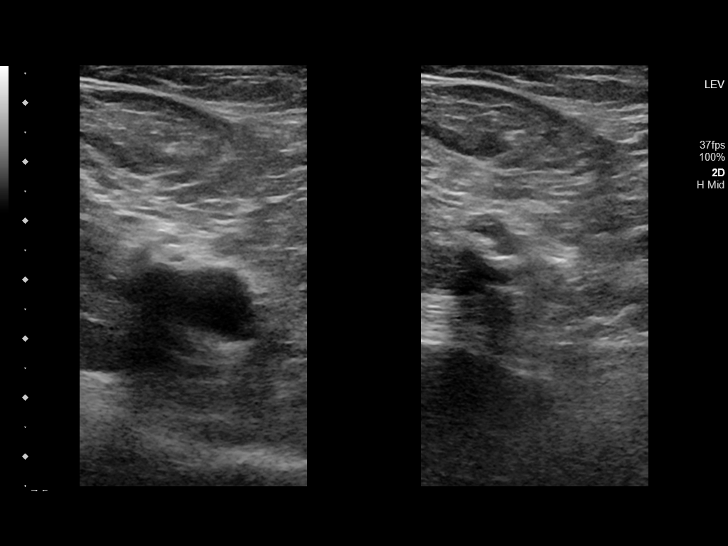
[im 8/86]
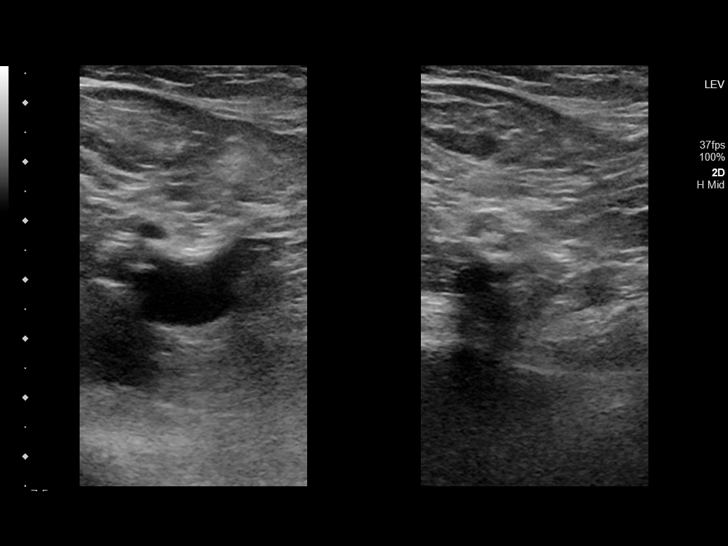
[im 15/86]
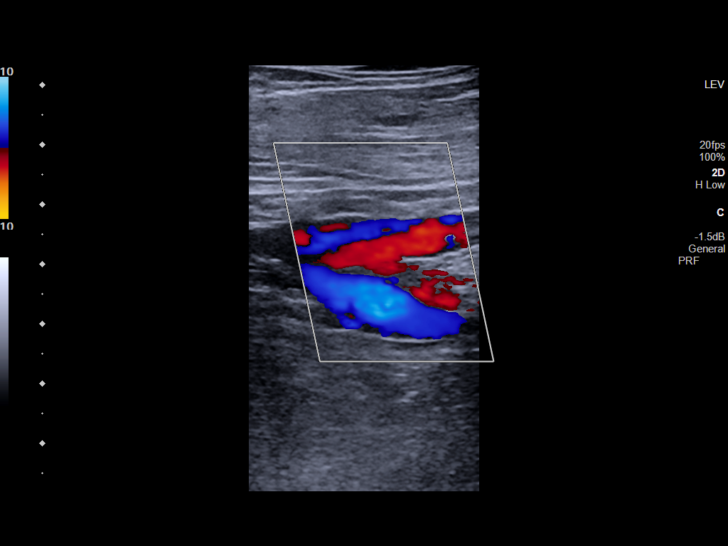
[im 23/86]
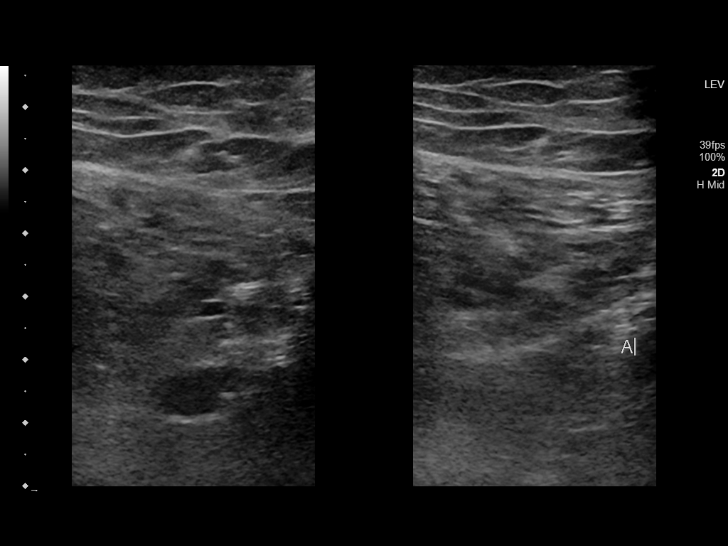
[im 30/86]
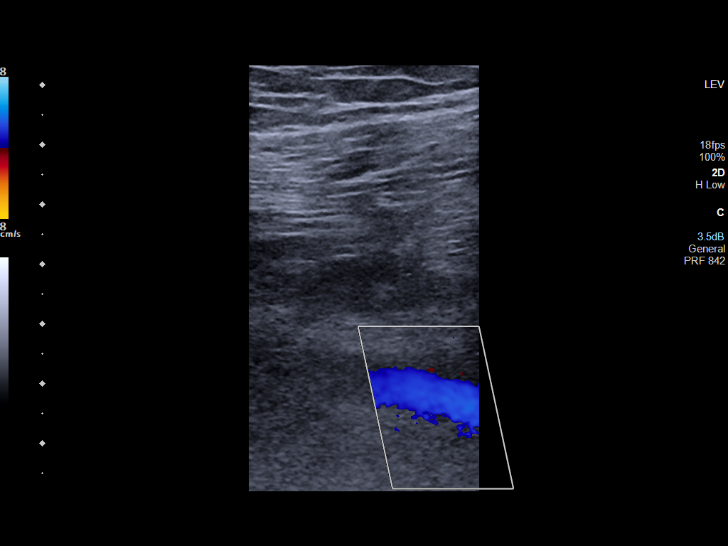
[im 37/86]
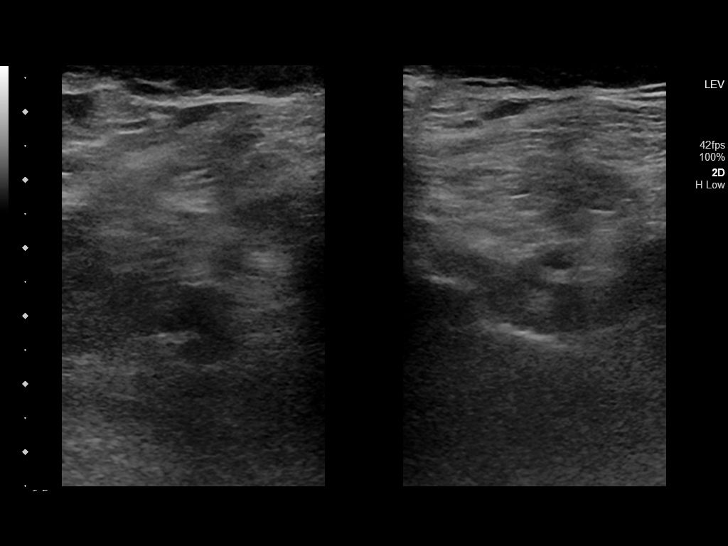
[im 45/86]
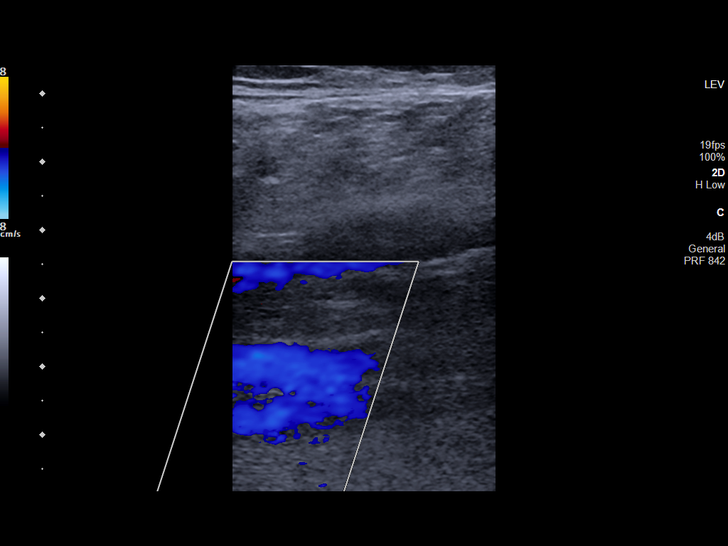
[im 49/86]
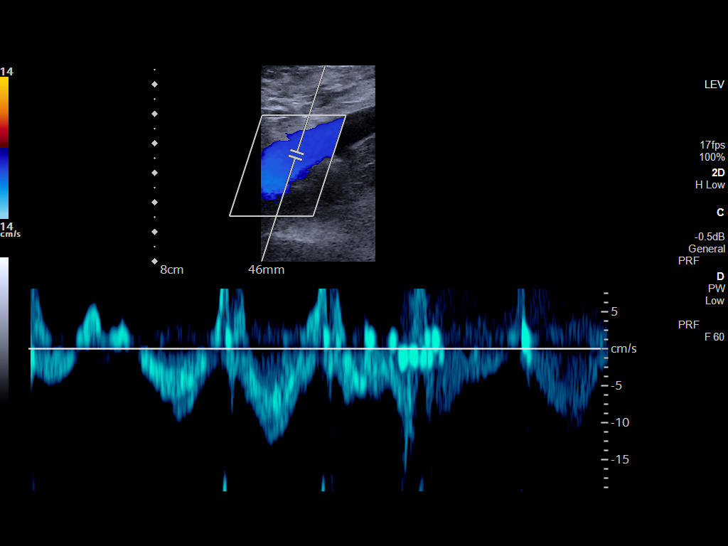
[im 56/86]
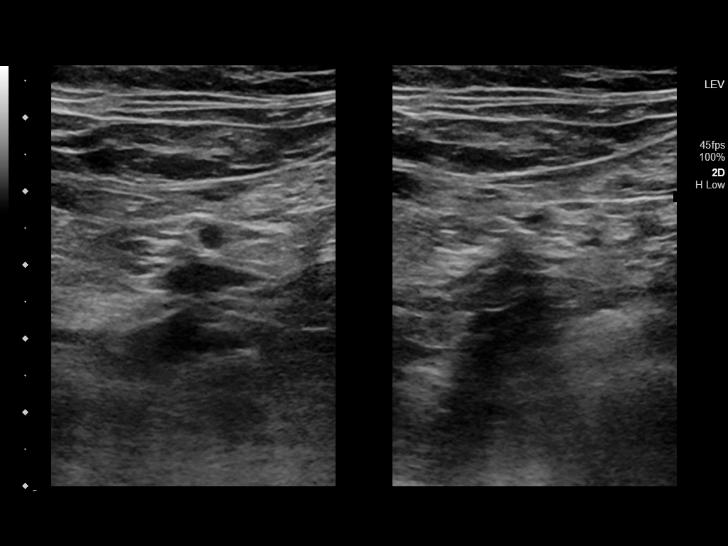
[im 63/86]
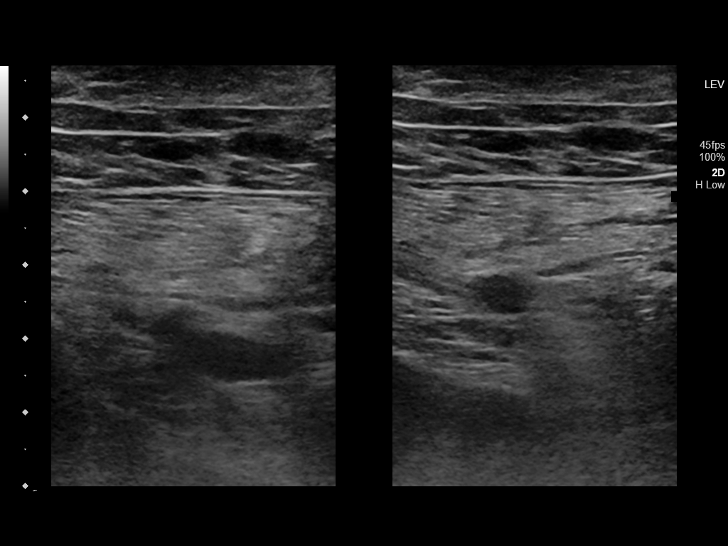
[im 71/86]
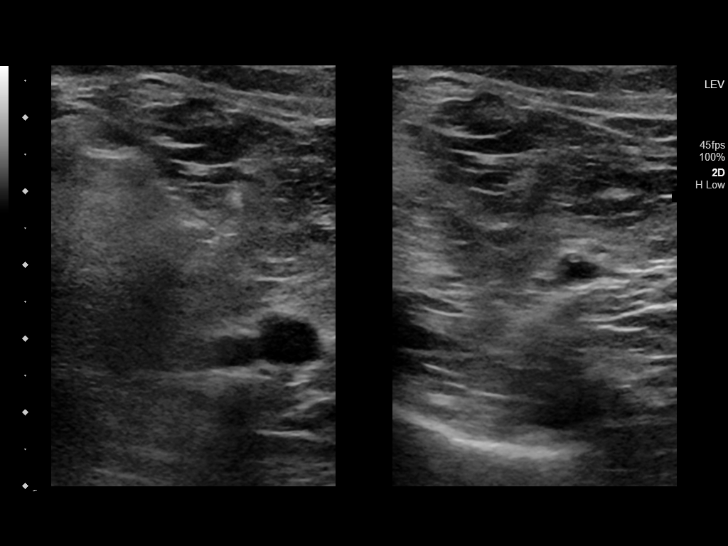
[im 78/86]
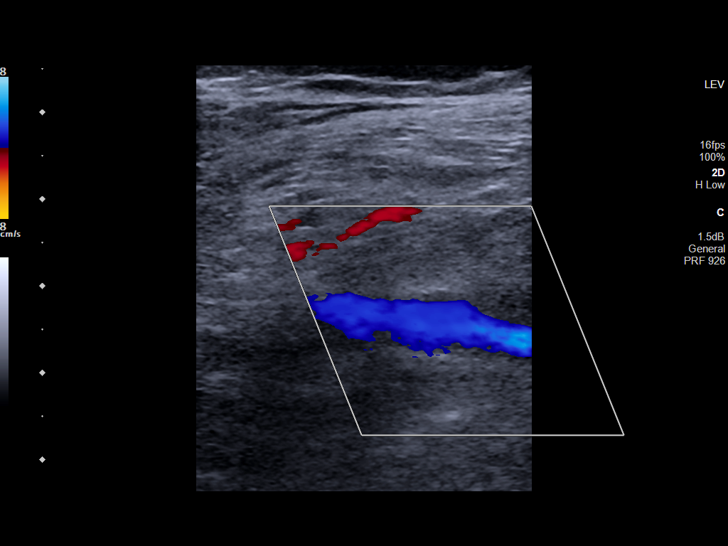
[im 86/86]
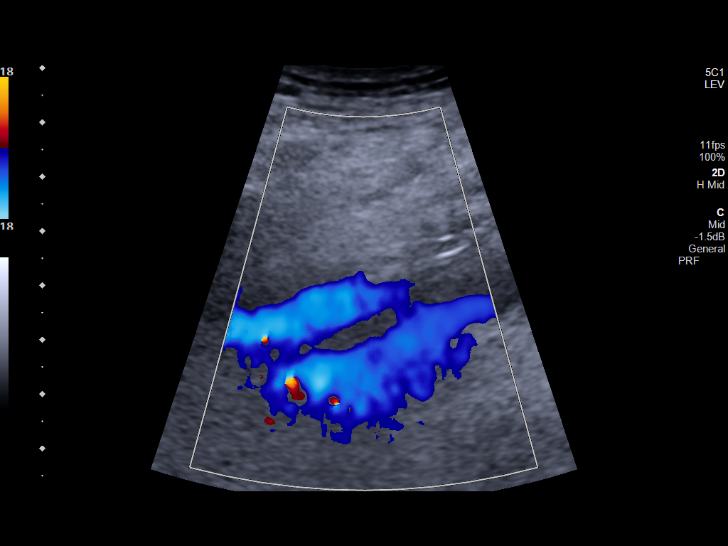

[13 of 24 positions shown; findings below may reference images not displayed]

FINDINGS: RIGHT LOWER EXTREMITY

Common Femoral Vein: No evidence of thrombus. Normal
compressibility, respiratory phasicity and response to augmentation.

Saphenofemoral Junction: No evidence of thrombus. Normal
compressibility and flow on color Doppler imaging.

Profunda Femoral Vein: No evidence of thrombus. Normal
compressibility and flow on color Doppler imaging.

Femoral Vein: No evidence of thrombus. Normal compressibility,
respiratory phasicity and response to augmentation.

Popliteal Vein: No evidence of thrombus. Normal compressibility,
respiratory phasicity and response to augmentation.

Calf Veins: Posterior tibial vein patent with compressibility.
Limited visualization of the peroneal vein.

Superficial Great Saphenous Vein: No evidence of thrombus. Normal
compressibility and flow on color Doppler imaging.

Other Findings:  None.

LEFT LOWER EXTREMITY

Common Femoral Vein: No evidence of thrombus. Normal
compressibility, respiratory phasicity and response to augmentation.

Saphenofemoral Junction: No evidence of thrombus. Normal
compressibility and flow on color Doppler imaging.

Profunda Femoral Vein: No evidence of thrombus. Normal
compressibility and flow on color Doppler imaging.

Femoral Vein: No evidence of thrombus. Normal compressibility,
respiratory phasicity and response to augmentation.

Popliteal Vein: No evidence of thrombus. Normal compressibility,
respiratory phasicity and response to augmentation.

Calf Veins: Posterior tibial vein patent with compressibility.
Limited evaluation of the peroneal vein.

Superficial Great Saphenous Vein: No evidence of thrombus. Normal
compressibility and flow on color Doppler imaging.

Other Findings:  None.
IMPRESSION: Sonographic survey of the bilateral lower extremities negative for
DVT

## 2020-07-16 IMAGING — DX DG CHEST 1V PORT
1 series · 1 of 1 positions shown · non-contrast
Comparison: None.

CLINICAL DATA: OG tube placement

EXAM:
PORTABLE CHEST 1 VIEW

[chest ap]
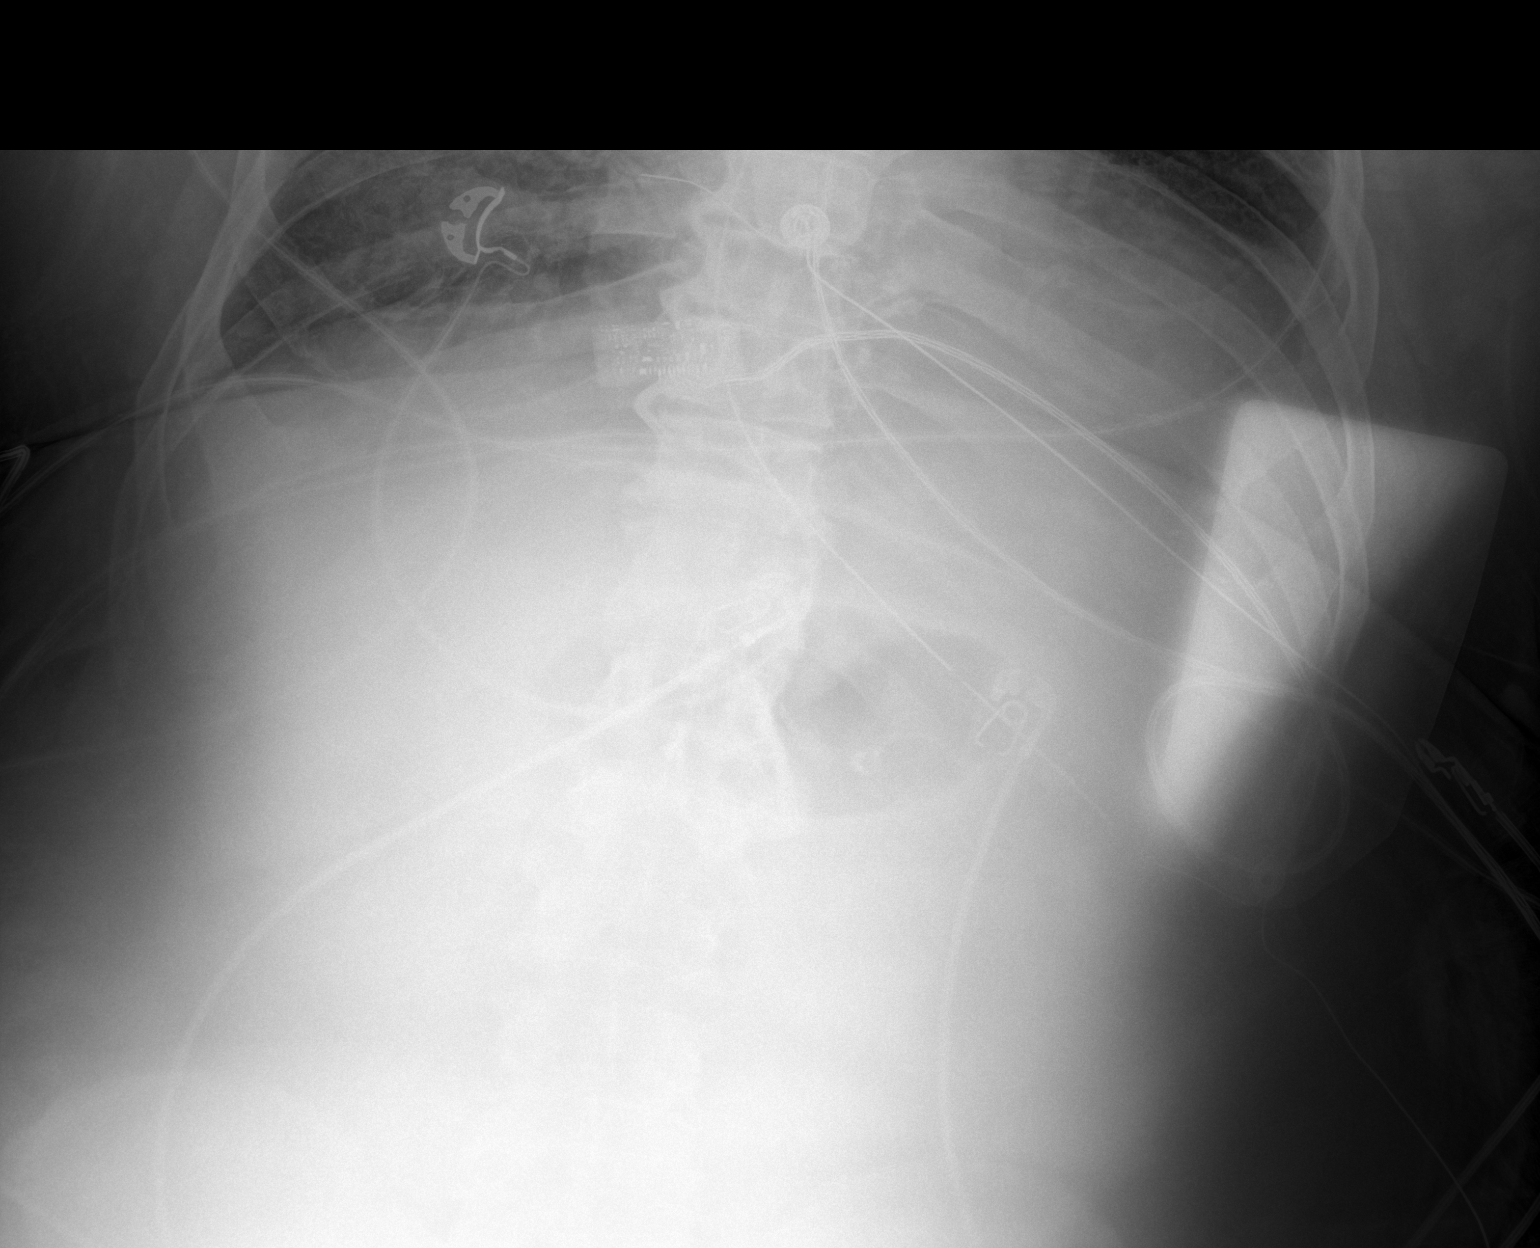

[1 of 1 positions shown; findings below may reference images not displayed]

FINDINGS: Tip of the OG tube is seen within the proximal stomach. Hazy
airspace opacities seen within both lower lungs.
IMPRESSION: Tip of the OG tube within the proximal stomach.

## 2020-07-16 MED ORDER — FENTANYL CITRATE (PF) 100 MCG/2ML IJ SOLN
25.0000 ug | Freq: Once | INTRAMUSCULAR | Status: AC
  Administered 2020-07-16: 02:00:00 25 ug via INTRAVENOUS
  Filled 2020-07-16: qty 2

## 2020-07-16 MED ORDER — SODIUM CHLORIDE 0.9 % IV SOLN
250.0000 mL | INTRAVENOUS | Status: DC
  Administered 2020-07-17: 17:00:00 250 mL via INTRAVENOUS

## 2020-07-16 MED ORDER — HEPARIN (PORCINE) 25000 UT/250ML-% IV SOLN
1600.0000 [IU]/h | INTRAVENOUS | Status: DC
  Administered 2020-07-16 – 2020-07-17 (×3): 1600 [IU]/h via INTRAVENOUS
  Filled 2020-07-16 (×3): qty 250

## 2020-07-16 MED ORDER — FAMOTIDINE IN NACL 20-0.9 MG/50ML-% IV SOLN: 20.0000 mg | Freq: Two times a day (BID) | INTRAVENOUS | Status: DC

## 2020-07-16 MED ORDER — ASPIRIN 81 MG PO CHEW
162.0000 mg | CHEWABLE_TABLET | Freq: Once | ORAL | Status: AC
  Administered 2020-07-16: 02:00:00 162 mg via ORAL
  Filled 2020-07-16: qty 2

## 2020-07-16 MED ORDER — METOPROLOL TARTRATE 25 MG PO TABS
12.5000 mg | ORAL_TABLET | Freq: Two times a day (BID) | ORAL | Status: DC
  Administered 2020-07-16: 09:00:00 12.5 mg via ORAL
  Filled 2020-07-16: qty 1

## 2020-07-16 MED ORDER — FOLIC ACID 1 MG PO TABS
2.0000 mg | ORAL_TABLET | Freq: Every day | ORAL | Status: DC
  Administered 2020-07-16 – 2020-07-19 (×4): 2 mg via ORAL
  Filled 2020-07-16 (×4): qty 2

## 2020-07-16 MED ORDER — DOCUSATE SODIUM 50 MG/5ML PO LIQD
100.0000 mg | Freq: Two times a day (BID) | ORAL | Status: DC
  Administered 2020-07-17 – 2020-07-18 (×3): 100 mg
  Filled 2020-07-16 (×5): qty 10

## 2020-07-16 MED ORDER — ACETAMINOPHEN 325 MG PO TABS
650.0000 mg | ORAL_TABLET | Freq: Four times a day (QID) | ORAL | Status: DC | PRN
Start: 2020-07-16 — End: 2020-07-17
  Administered 2020-07-16 – 2020-07-17 (×2): 650 mg via ORAL
  Filled 2020-07-16 (×2): qty 2

## 2020-07-16 MED ORDER — PROSOURCE TF PO LIQD
90.0000 mL | Freq: Three times a day (TID) | ORAL | Status: DC
  Administered 2020-07-16 – 2020-07-19 (×7): 90 mL
  Filled 2020-07-16 (×11): qty 90

## 2020-07-16 MED ORDER — ASPIRIN 81 MG PO CHEW
81.0000 mg | CHEWABLE_TABLET | Freq: Every day | ORAL | Status: DC
  Administered 2020-07-16 – 2020-07-19 (×4): 81 mg
  Filled 2020-07-16 (×4): qty 1

## 2020-07-16 MED ORDER — PERFLUTREN LIPID MICROSPHERE
1.0000 mL | INTRAVENOUS | Status: AC | PRN
  Administered 2020-07-16: 14:00:00 3 mL via INTRAVENOUS
  Filled 2020-07-16: qty 10

## 2020-07-16 MED ORDER — POLYETHYLENE GLYCOL 3350 17 G PO PACK: 17.0000 g | PACK | Freq: Every day | ORAL | Status: DC

## 2020-07-16 MED ORDER — CHLORHEXIDINE GLUCONATE CLOTH 2 % EX PADS
6.0000 | MEDICATED_PAD | Freq: Every day | CUTANEOUS | Status: DC
  Administered 2020-07-16: 21:00:00 6 via TOPICAL

## 2020-07-16 MED ORDER — POLYETHYLENE GLYCOL 3350 17 G PO PACK: 17.0000 g | PACK | Freq: Every day | ORAL | Status: DC | PRN

## 2020-07-16 MED ORDER — FUROSEMIDE 10 MG/ML IJ SOLN
40.0000 mg | Freq: Once | INTRAMUSCULAR | Status: AC
  Administered 2020-07-16: 02:00:00 40 mg via INTRAVENOUS
  Filled 2020-07-16: qty 4

## 2020-07-16 MED ORDER — PIPERACILLIN-TAZOBACTAM 3.375 G IVPB
3.3750 g | Freq: Three times a day (TID) | INTRAVENOUS | Status: DC
  Administered 2020-07-16 – 2020-07-19 (×8): 3.375 g via INTRAVENOUS
  Filled 2020-07-16 (×10): qty 50

## 2020-07-16 MED ORDER — PROPOFOL 1000 MG/100ML IV EMUL
0.0000 ug/kg/min | INTRAVENOUS | Status: DC
  Administered 2020-07-16: 08:00:00 15 ug/kg/min via INTRAVENOUS
  Administered 2020-07-16: 21:00:00 10 ug/kg/min via INTRAVENOUS
  Administered 2020-07-17 (×2): 15 ug/kg/min via INTRAVENOUS
  Administered 2020-07-18: 30 ug/kg/min via INTRAVENOUS
  Administered 2020-07-18: 50 ug/kg/min via INTRAVENOUS
  Administered 2020-07-18 – 2020-07-19 (×3): 20 ug/kg/min via INTRAVENOUS
  Filled 2020-07-16 (×10): qty 100

## 2020-07-16 MED ORDER — FAMOTIDINE IN NACL 20-0.9 MG/50ML-% IV SOLN
20.0000 mg | Freq: Two times a day (BID) | INTRAVENOUS | Status: DC
  Administered 2020-07-16 – 2020-07-17 (×4): 20 mg via INTRAVENOUS
  Filled 2020-07-16 (×6): qty 50

## 2020-07-16 MED ORDER — FENTANYL 2500MCG IN NS 250ML (10MCG/ML) PREMIX INFUSION
25.0000 ug/h | INTRAVENOUS | Status: DC
  Administered 2020-07-16: 25 ug/h via INTRAVENOUS
  Administered 2020-07-18: 100 ug/h via INTRAVENOUS
  Filled 2020-07-16 (×2): qty 250

## 2020-07-16 MED ORDER — ASPIRIN EC 325 MG PO TBEC: 325.0000 mg | DELAYED_RELEASE_TABLET | Freq: Every day | ORAL | Status: DC

## 2020-07-16 MED ORDER — ASPIRIN EC 81 MG PO TBEC: 81.0000 mg | DELAYED_RELEASE_TABLET | Freq: Every day | ORAL | Status: DC

## 2020-07-16 MED ORDER — FENTANYL BOLUS VIA INFUSION
25.0000 ug | INTRAVENOUS | Status: DC | PRN
  Administered 2020-07-16: 25 ug via INTRAVENOUS
  Filled 2020-07-16: qty 25

## 2020-07-16 MED ORDER — FENTANYL CITRATE (PF) 100 MCG/2ML IJ SOLN: 25.0000 ug | INTRAMUSCULAR | Status: DC | PRN

## 2020-07-16 MED ORDER — THIAMINE HCL 100 MG/ML IJ SOLN
100.0000 mg | Freq: Every day | INTRAMUSCULAR | Status: DC
  Administered 2020-07-16 – 2020-07-19 (×4): 100 mg via INTRAVENOUS
  Filled 2020-07-16 (×5): qty 2

## 2020-07-16 MED ORDER — PHENYLEPHRINE HCL-NACL 10-0.9 MG/250ML-% IV SOLN
25.0000 ug/min | INTRAVENOUS | Status: DC
  Administered 2020-07-16: 23:00:00 25 ug/min via INTRAVENOUS
  Administered 2020-07-17: 04:00:00 125 ug/min via INTRAVENOUS
  Administered 2020-07-17 (×2): 100 ug/min via INTRAVENOUS
  Administered 2020-07-17: 10:00:00 35 ug/min via INTRAVENOUS
  Administered 2020-07-17: 06:00:00 100 ug/min via INTRAVENOUS
  Filled 2020-07-16 (×6): qty 250

## 2020-07-16 MED ORDER — VITAL HIGH PROTEIN PO LIQD
1000.0000 mL | ORAL | Status: DC
  Administered 2020-07-16: 17:00:00 1000 mL

## 2020-07-16 MED ORDER — HEPARIN SODIUM (PORCINE) 5000 UNIT/ML IJ SOLN: 5000.0000 [IU] | Freq: Three times a day (TID) | INTRAMUSCULAR | Status: DC

## 2020-07-16 MED ORDER — ATORVASTATIN CALCIUM 20 MG PO TABS
40.0000 mg | ORAL_TABLET | Freq: Every day | ORAL | Status: DC
  Administered 2020-07-16 – 2020-07-17 (×2): 40 mg via ORAL
  Filled 2020-07-16 (×2): qty 2

## 2020-07-16 MED ORDER — DOCUSATE SODIUM 100 MG PO CAPS: 100.0000 mg | ORAL_CAPSULE | Freq: Two times a day (BID) | ORAL | Status: DC | PRN

## 2020-07-16 MED ORDER — ADULT MULTIVITAMIN W/MINERALS CH
1.0000 | ORAL_TABLET | Freq: Every day | ORAL | Status: DC
  Administered 2020-07-16 – 2020-07-19 (×4): 1 via ORAL
  Filled 2020-07-16 (×4): qty 1

## 2020-07-16 MED ORDER — PROPOFOL 1000 MG/100ML IV EMUL: 0.0000 ug/kg/min | INTRAVENOUS | Status: DC

## 2020-07-16 MED ORDER — SODIUM CHLORIDE 0.9 % IV SOLN: 1.0000 mg | Freq: Every day | INTRAVENOUS | Status: DC

## 2020-07-16 MED ORDER — MUPIROCIN 2 % EX OINT
1.0000 "application " | TOPICAL_OINTMENT | Freq: Two times a day (BID) | CUTANEOUS | Status: DC
  Administered 2020-07-16 – 2020-07-19 (×7): 1 via NASAL
  Filled 2020-07-16: qty 22

## 2020-07-16 NOTE — H&P (Signed)
PULMONARY / CRITICAL CARE MEDICINE HISTORY & PHYSICAL   Name: Antonio [name] MRN: [id number] DOB: Jul 08, 1946    LOS: 0  Admitting Provider:  Samuella Cota. Reinaldo Berber, MD Reason for Referral:   Cardiac arrest  Brief patient description:  Antonio [name] is a 74 year old male presenting with acute hypoxic respiratory failure, subsequently PEA arrest with ROSC after 15 minutes, decompensated heart failure.    HPI:  Antonio [name] is a 74 year old male with history of COPD, chronic diastolic heart failure, hypertension, morbid obesity, alcohol abuse, tobacco abuse and poor healthcare follow-up who was brought to the ED via EMS after his roommate called his son to tell him the patient was having difficulty breathing.  The son asked the roommate to called 54 which he did.    EMS found the patient to be in respiratory distress and was placed on BiPAP.  Just upon arrival to ED, the patient reportedly lost consciousness, and was found to be in PEA arrest.  He received about 4 rounds of epinephrine and achieved ROSC after approximately 15 minutes.  He was intubated.  After achieving ROSC, the patient was then having runs of non-sustained Vtach for which he was started on Amiodarone drip.  He is being sedated with propofol and has stable blood pressures without need for vasopressors.  He was noted to have pink frothy secretions after intubation, and CXR showed increased interstitial markings suggestive of pulmonary edema, and bilateral patchy airspace opacities.  Viral panel was negative for COVID and influenza A/B.  hsTrop was initially 162, then 297, BNP of 562 and lactic acid was 5.4.   The patient's 2 sons -- Jennie Mcnair and [place] were bedside, and has elected to make the patient DNR.    Of note, the patient was admitted here 07/27/19 to 08/20/19 for acute hypoxic and hypercapnic respiratory failure related to COPD and heart failure exacerbation for which he required intubation for nearly 2 weeks.    According the the sons, the patient had not seen a PCP since his discharge from the hospital in Feb 2021, and has not been on any medications.  At baseline, he is fully functional, drives, does grocery shopping, etc, but has exercise limitations.  He lives alone but rents a room.  Per the sons, he sometimes drinks several cans of beer in day but is not a daily drinker. One of his sons says he spoke to him earlier in the morning, and he said he was feeling great.     Past Medical History:  Diagnosis Date  . COPD (chronic obstructive pulmonary disease) ([place])   . ETOH abuse   . Smoker    No past surgical history on file. No current facility-administered medications on file prior to encounter.   No current outpatient medications on file prior to encounter.    Allergies No Known Allergies  Family History No family history on file. Social History  reports that he has been smoking cigarettes. He has a 27.50 pack-year smoking history. He has never used smokeless tobacco. He reports that he does not drink alcohol and does not use drugs.  Review Of Systems:  All systems reviewed and negative except as noted in HPI.  VITAL SIGNS: BP (!) 134/39 (BP Location: Right Arm)   Pulse 71   Temp 98.6 F (37 C) (Oral)   Resp (!) 26   Ht 5\' 6"  (1.676 m)   Wt 114.8 kg   SpO2 100%   BMI 40.85 kg/m  HEMODYNAMICS:    VENTILATOR SETTINGS: Vent Mode: PRVC FiO2 (%):  [70 %-100 %] 70 % Set Rate:  [18 bmp-24 bmp] 24 bmp Vt Set:  [450 mL] 450 mL PEEP:  [8 cmH20] 8 cmH20  INTAKE / OUTPUT: No intake/output data recorded.  PHYSICAL EXAMINATION: General:  Sedated on mechanical vent, in NAD, 2 sons and daughter-in-law bedside HEENT:  NCAT, sclera anicteric, dry MM, ETT in place Neuro:  Sedated on vent Cardiovascular:  S1S2 present, RRR, no audible murmur, gallops or rubs Lungs:  Bilateral crackles and coarseness, non-labored Abdomen:  Obese, soft, +BS Musculoskeletal:  1+ edema bilateral  LEs, no obvious calf tenderness, warmth or erythema Skin:  Warm and dry  LABS:  BMET Recent Labs  Lab July 22, 2020 2113  NA 139  K 3.9  CL 104  CO2 22  BUN 15  CREATININE 1.62*  GLUCOSE 312*    Electrolytes Recent Labs  Lab 07/22/20 2113  CALCIUM 8.0*  MG 2.4    CBC Recent Labs  Lab 2020/07/22 2113  WBC 21.7*  HGB 14.1  HCT 44.8  PLT 323    Coag's No results for input(s): APTT, INR in the last 168 hours.  Sepsis Markers Recent Labs  Lab 07/22/2020 2113 07-22-20 2246  LATICACIDVEN 5.4* 2.1*    ABG Recent Labs  Lab 07/16/20 0049  PHART 7.34*  PCO2ART 45  PO2ART 167*    Liver Enzymes Recent Labs  Lab 22-Jul-2020 2113  AST 55*  ALT 35  ALKPHOS 75  BILITOT 0.8  ALBUMIN 3.4*    Cardiac Enzymes No results for input(s): TROPONINI, PROBNP in the last 168 hours.  Glucose No results for input(s): GLUCAP in the last 168 hours.  Imaging DG Chest Portable 1 View  Result Date: 07/16/2020 CLINICAL DATA:  OG tube placement EXAM: PORTABLE CHEST 1 VIEW COMPARISON:  None. FINDINGS: Tip of the OG tube is seen within the proximal stomach. Hazy airspace opacities seen within both lower lungs. IMPRESSION: Tip of the OG tube within the proximal stomach. Electronically Signed   By: Jonna Clark M.D.   On: 07/16/2020 00:47   DG Chest Portable 1 View  Result Date: Jul 22, 2020 CLINICAL DATA:  Intubation EXAM: PORTABLE CHEST 1 VIEW COMPARISON:  Chest x-ray 08/21/2019 FINDINGS: Interval placement of an endotracheal tube with tip 7 cm above the carina. Enteric tube with tip overlying the expected region of the gastroesophageal junction and side port overlying the expected region of distal esophagus. Cardiac paddles overlie the chest. The heart size and mediastinal contours are unchanged with cardiomegaly. Increased interstitial markings within bilateral upper lobes. Diffuse vague patchy airspace opacities that are more prominent by within the bilateral lower lobes. No pleural  effusion. No pneumothorax. No acute osseous abnormality. IMPRESSION: 1. Endotracheal tube with tip 7 cm above the carina. 2. Enteric tube with tip overlying the expected region of the gastroesophageal junction and side port the expected region of distal esophagus. Recommend advancement by 10 cm. 3. Pulmonary edema. 4. Bilateral lower lobe patchy airspace opacities could represent infection/inflammation. Electronically Signed   By: Tish Frederickson M.D.   On: 07-22-2020 21:08     CULTURES: Blood cultures: pending   ANTIBIOTICS: None   SIGNIFICANT EVENTS:   LINES/TUBES: PIV Foley     ASSESSMENT / PLAN:  Antonio [name] is a 74 year old male with history of COPD, chronic diastolic heart failure, hypertension, morbid obesity, alcohol abuse, tobacco abuse and poor healthcare follow-up presenting with acute hypoxic respiratory failure, subsequently PEA arrest with  ROSC after 15 minutes, decompensated heart failure.    Acute hypoxic respiratory failure Acute exacerbation of diastolic heart failure/ pulmonary edema COPD exacerbation Noted to have pink frothy secretions after intubation, and CXR showed increased interstitial markings suggestive of pulmonary edema, and bilateral patchy airspace opacities.  Viral panel was negative for COVID and influenza A/B.  hsTrop was intially 162, then 297, BNP of 562 and lactic acid was 5.4.  Per son, no report of recent illness, illness appears quite acute.  Differential includes acute flash pulmonary edema/heart failure, acute COPD exacerbation, aspiration pneumonitis, pulmonary embolism or acute ischemic heart.  Poor window on bedside cardiac ECHO, but lung exam notable for B lines suggestive of interstitial process.  Leukocytosis likely stress/reactive due   Admit to medical ICU  Mechanical vent support -Lung protective w/ 6cc/kg PBW -Maintain Pplat < 30 -PEEP as tolerated  Diurese -- will repeat Lasix 40mg  IV  Strict I/Os  Monitor  electrolytes  TTE in AM  Start heparin drip until able to exclude PE -- consider CTPA in AM if renal function is improved, VQ less helpful with abnormal CXR  LE duplex  Tracheal aspirate for sputum culture  PEA arrest Non-sustained Vtach Type II MI PEA arrest likely result of acute respiratory failure; no strong evidence of ACS.  Runs of non-sustained Vtach   Trend troponin  Heparin drip for now  Aspirin  Statin  Low dose beta blockers  Continue amiodarone  AKI Cr 1.62, baseline 1.49 from 08/2019.  IV fluids -- give another LR 500 cc bolus  Strict I/Os  Avoid nephrotoxic agents  Renal US  Alcohol abuse  Thiamine, MVI and folic acid  Monitor for withdrawal symptoms  Tobacco abuse  Nicotine patch when awake  Hypertension  Hold any antihypertensive for now due to soft BP    Best Practice: Code Status:  DNR Diet:  NPO GI prophylaxis:  Pepcid VTE prophylaxis:  Heparin SQ  FAMILY  - Updates:  Spoke with 2 sons Onterrio Ruley and [place] bedside.   Critical Care time 45 minutes.  ___________________ Samuella Cota Reinaldo Berber, MD Pulmonary and [place]   07/16/2020, 3:11 AM

## 2020-07-16 NOTE — Consult Note (Signed)
ANTICOAGULATION CONSULT NOTE  Pharmacy Consult for Heparin Infusion Indication: chest pain/ACS  Patient Measurements: Heparin Dosing Weight: 90.3 kg  Labs: Recent Labs    08-04-2020 2113 2020-08-04 2252 07/16/20 0510 07/16/20 0634 07/16/20 1224 07/16/20 1418 07/16/20 2037  HGB 14.1  --  14.4  --   --   --   --   HCT 44.8  --  42.9  --   --   --   --   PLT 323  --  284  --   --   --   --   APTT  --   --   --  58*  --   --   --   LABPROT  --   --   --  14.7  --   --   --   INR  --   --   --  1.2  --   --   --   HEPARINUNFRC  --   --   --   --  0.52  --  0.66  CREATININE 1.62*  --  1.62*  --   --  2.03*  --   TROPONINIHS 162* 297* 1,455* 1,380*  --   --   --     Estimated Creatinine Clearance: 37.4 mL/min (A) (by C-G formula based on SCr of 2.03 mg/dL (H)).   Medical History: Past Medical History:  Diagnosis Date  . COPD (chronic obstructive pulmonary disease) (HCC)   . ETOH abuse   . Smoker     Medications:  No anticoagulation prior to admission per my chart review  Assessment: Patient is a 74 y/o M with medical history as above who presented to the ED 12/28 with acute hypoxic respiratory failure with subsequent PEA arrest with ROSC after 15 minutes, decompensated heart failure. Patient was intubated and remains sedated on mechanical ventilation in the ICU. Pharmacy consulted for heparin infusion for ACS.  Baseline aPTT 58s (drawn after initiation of heparin infusion), INR 1.2. Baseline CBC with H&H, platelets within normal limits.  Heparin course: 12/29 at 0346: heparin drip started at 1600 units/hr 12/29 at 1224: HL = 0.52, therapeutic x 1. Continue heparin infusion at 1600 units/hr 12/29:  HL @ 2037 = 0.66, therapeutic X 2   Goal of Therapy:  Heparin level 0.3-0.7 units/ml Monitor platelets by anticoagulation protocol: Yes   Plan:  12/29:  HL @ 2037 = 0.66 Will recheck HL on 12/30 with AM labs.   Antonio [name] D 07/16/2020,9:47 PM

## 2020-07-16 NOTE — Progress Notes (Signed)
PULMONARY / CRITICAL Progress Note  Name: Antonio [name] MRN: [id number] DOB: [birth year]    LOS: 0  Admitting Provider:  Samuella Cota. Reinaldo Berber, MD Reason for Referral:   Cardiac arrest  Brief patient description:  [name] is a 74 year old male presenting with acute hypoxic respiratory failure, subsequently PEA arrest with ROSC after 15 minutes, decompensated heart failure.    HPI:  [name] is a 74 year old male with history of COPD, chronic diastolic heart failure, hypertension, morbid obesity, alcohol abuse, tobacco abuse and poor healthcare follow-up who was brought to the ED via EMS after his roommate called his son to tell him the patient was having difficulty breathing.  The son asked the roommate to called 48 which he did.    EMS found the patient to be in respiratory distress and was placed on BiPAP.  Just upon arrival to ED, the patient reportedly lost consciousness, and was found to be in PEA arrest.  He received about 4 rounds of epinephrine and achieved ROSC after approximately 15 minutes.  He was intubated.  After achieving ROSC, the patient was then having runs of non-sustained Vtach for which he was started on Amiodarone drip.  He is being sedated with propofol and has stable blood pressures without need for vasopressors.  He was noted to have pink frothy secretions after intubation, and CXR showed increased interstitial markings suggestive of pulmonary edema, and bilateral patchy airspace opacities.  Viral panel was negative for COVID and influenza A/B.  hsTrop was initially 162, then 297, BNP of 562 and lactic acid was 5.4.   The patient's 2 sons -- Antonio [name] and Antonio [name] were bedside, and has elected to make the patient DNR.    Of note, the patient was admitted here 07/27/19 to 08/20/19 for acute hypoxic and hypercapnic respiratory failure related to COPD and heart failure exacerbation for which he required intubation for nearly 2 weeks.   According the the  sons, the patient had not seen a PCP since his discharge from the hospital in Feb 2021, and has not been on any medications.  At baseline, he is fully functional, drives, does grocery shopping, etc, but has exercise limitations.  He lives alone but rents a room.  Per the sons, he sometimes drinks several cans of beer in day but is not a daily drinker. One of his sons says he spoke to him earlier in the morning, and he said he was feeling great.   Interval events:  Patient stable throughout the day. Renal function continues to worsen    Past Medical History:  Diagnosis Date  . COPD (chronic obstructive pulmonary disease) ([place])   . ETOH abuse   . Smoker    No past surgical history on file. No current facility-administered medications on file prior to encounter.   No current outpatient medications on file prior to encounter.    Allergies No Known Allergies  Family History No family history on file. Social History  reports that he has been smoking cigarettes. He has a 27.50 pack-year smoking history. He has never used smokeless tobacco. He reports that he does not drink alcohol and does not use drugs.  Review Of Systems:  All systems reviewed and negative except as noted in HPI.  VITAL SIGNS: BP (!) 172/60   Pulse 85   Temp (!) 100.4 F (38 C)   Resp (!) 28   Ht 5\' 6"  (1.676 m)   Wt 114.8 kg   SpO2 93%  BMI 40.85 kg/m   HEMODYNAMICS:    VENTILATOR SETTINGS: Vent Mode: PRVC FiO2 (%):  [50 %-100 %] 50 % Set Rate:  [18 bmp-24 bmp] 24 bmp Vt Set:  [450 mL] 450 mL PEEP:  [5 cmH20-8 cmH20] 5 cmH20  INTAKE / OUTPUT: I/O last 3 completed shifts: In: 1191.8 [I.V.:851.8; NG/GT:240; IV Piggyback:100] Out: 1425 [Urine:1425]  PHYSICAL EXAMINATION: General:  Sedated on mechanical vent, in NAD, 2 sons and daughter-in-law bedside HEENT:  NCAT, sclera anicteric, dry MM, ETT in place Neuro:  Sedated on vent Cardiovascular:  S1S2 present, RRR, no audible murmur, gallops or  rubs Lungs:  Bilateral crackles and coarseness, non-labored Abdomen:  Obese, soft, +BS Musculoskeletal:  1+ edema bilateral LEs, no obvious calf tenderness, warmth or erythema Skin:  Warm and dry  LABS:  BMET Recent Labs  Lab 07/14/2020 2113 07/16/20 0510 07/16/20 1418  NA 139 137 137  K 3.9 4.4 4.9  CL 104 103 104  CO2 22 25 24   BUN 15 21 26*  CREATININE 1.62* 1.62* 2.03*  GLUCOSE 312* 143* 120*    Electrolytes Recent Labs  Lab 06/27/2020 2113 07/16/20 0510 07/16/20 1418  CALCIUM 8.0* 8.0* 7.9*  MG 2.4 2.0 2.0  PHOS  --  3.5 5.0*    CBC Recent Labs  Lab 07/08/2020 2113 07/16/20 0510  WBC 21.7* 14.3*  HGB 14.1 14.4  HCT 44.8 42.9  PLT 323 284    Coag's Recent Labs  Lab 07/16/20 0634  APTT 58*  INR 1.2    Sepsis Markers Recent Labs  Lab 07/03/2020 2246 07/16/20 0511 07/16/20 0634 07/16/20 1224  LATICACIDVEN 2.1* 1.1 1.1  --   PROCALCITON  --   --   --  5.62    ABG Recent Labs  Lab 07/16/20 0049  PHART 7.34*  PCO2ART 45  PO2ART 167*    Liver Enzymes Recent Labs  Lab 07/04/2020 2113 07/16/20 0510  AST 55* 47*  ALT 35 38  ALKPHOS 75 60  BILITOT 0.8 0.6  ALBUMIN 3.4* 3.4*    Cardiac Enzymes No results for input(s): TROPONINI, PROBNP in the last 168 hours.  Glucose Recent Labs  Lab 07/16/20 0748 07/16/20 1102 07/16/20 1613 07/16/20 1920  GLUCAP 126* 120* 119* 123*    Imaging US Venous Img Lower Bilateral (DVT)  Result Date: 07/16/2020 CLINICAL DATA:  74 year old male with history of swelling EXAM: BILATERAL LOWER EXTREMITY VENOUS DOPPLER ULTRASOUND TECHNIQUE: Gray-scale sonography with graded compression, as well as color Doppler and duplex ultrasound were performed to evaluate the lower extremity deep venous systems from the level of the common femoral vein and including the common femoral, femoral, profunda femoral, popliteal and calf veins including the posterior tibial, peroneal and gastrocnemius veins when visible. The  superficial great saphenous vein was also interrogated. Spectral Doppler was utilized to evaluate flow at rest and with distal augmentation maneuvers in the common femoral, femoral and popliteal veins. COMPARISON:  None. FINDINGS: RIGHT LOWER EXTREMITY Common Femoral Vein: No evidence of thrombus. Normal compressibility, respiratory phasicity and response to augmentation. Saphenofemoral Junction: No evidence of thrombus. Normal compressibility and flow on color Doppler imaging. Profunda Femoral Vein: No evidence of thrombus. Normal compressibility and flow on color Doppler imaging. Femoral Vein: No evidence of thrombus. Normal compressibility, respiratory phasicity and response to augmentation. Popliteal Vein: No evidence of thrombus. Normal compressibility, respiratory phasicity and response to augmentation. Calf Veins: Posterior tibial vein patent with compressibility. Limited visualization of the peroneal vein. Superficial Great Saphenous Vein: No evidence of thrombus.  Normal compressibility and flow on color Doppler imaging. Other Findings:  None. LEFT LOWER EXTREMITY Common Femoral Vein: No evidence of thrombus. Normal compressibility, respiratory phasicity and response to augmentation. Saphenofemoral Junction: No evidence of thrombus. Normal compressibility and flow on color Doppler imaging. Profunda Femoral Vein: No evidence of thrombus. Normal compressibility and flow on color Doppler imaging. Femoral Vein: No evidence of thrombus. Normal compressibility, respiratory phasicity and response to augmentation. Popliteal Vein: No evidence of thrombus. Normal compressibility, respiratory phasicity and response to augmentation. Calf Veins: Posterior tibial vein patent with compressibility. Limited evaluation of the peroneal vein. Superficial Great Saphenous Vein: No evidence of thrombus. Normal compressibility and flow on color Doppler imaging. Other Findings:  None. IMPRESSION: Sonographic survey of the bilateral  lower extremities negative for DVT Electronically Signed   By: Corrie Mckusick D.O.   On: 07/16/2020 11:54   DG Chest Port 1 View  Result Date: 07/16/2020 CLINICAL DATA:  Shortness of breath, cardiac arrest EXAM: PORTABLE CHEST 1 VIEW COMPARISON:  07/16/2020 FINDINGS: Mild bibasilar opacities, likely reflecting small bilateral pleural effusions, right greater than left. Associated right lower lobe atelectasis. No frank interstitial edema. No pneumothorax. Endotracheal tube terminates 15 mm above the carina. Enteric tube courses into the mid stomach. The heart is normal in size. IMPRESSION: Small bilateral pleural effusions, right greater than left. Associated right lower lobe atelectasis. Endotracheal tube terminates 15 mm above the carina. Enteric tube courses into the mid stomach. Electronically Signed   By: Julian Hy M.D.   On: 07/16/2020 11:55   DG Chest Portable 1 View  Result Date: 07/16/2020 CLINICAL DATA:  OG tube placement EXAM: PORTABLE CHEST 1 VIEW COMPARISON:  None. FINDINGS: Tip of the OG tube is seen within the proximal stomach. Hazy airspace opacities seen within both lower lungs. IMPRESSION: Tip of the OG tube within the proximal stomach. Electronically Signed   By: Prudencio Pair M.D.   On: 07/16/2020 00:47   DG Chest Portable 1 View  Result Date: 07/14/2020 CLINICAL DATA:  Intubation EXAM: PORTABLE CHEST 1 VIEW COMPARISON:  Chest x-ray 08/21/2019 FINDINGS: Interval placement of an endotracheal tube with tip 7 cm above the carina. Enteric tube with tip overlying the expected region of the gastroesophageal junction and side port overlying the expected region of distal esophagus. Cardiac paddles overlie the chest. The heart size and mediastinal contours are unchanged with cardiomegaly. Increased interstitial markings within bilateral upper lobes. Diffuse vague patchy airspace opacities that are more prominent by within the bilateral lower lobes. No pleural effusion. No pneumothorax.  No acute osseous abnormality. IMPRESSION: 1. Endotracheal tube with tip 7 cm above the carina. 2. Enteric tube with tip overlying the expected region of the gastroesophageal junction and side port the expected region of distal esophagus. Recommend advancement by 10 cm. 3. Pulmonary edema. 4. Bilateral lower lobe patchy airspace opacities could represent infection/inflammation. Electronically Signed   By: Iven Finn M.D.   On: 06/27/2020 21:08   ECHOCARDIOGRAM COMPLETE  Result Date: 07/16/2020    ECHOCARDIOGRAM REPORT   Patient Name:   Antonio [name] Date of Exam: 07/16/2020 Medical Rec #:  [id number]       Height:       66.0 in Accession #:    [id number]      Weight:       253.1 lb Date of Birth:  09-Dec-1945      BSA:          2.210 m Patient Age:    69 years  BP:           113/40 mmHg Patient Gender: M               HR:           72 bpm. Exam Location:  [facility] Procedure: 2D Echo, Color Doppler, Cardiac Doppler and Intracardiac            Opacification Agent Indications:     I46.9 Cardiac arrest  History:         Patient has prior history of Echocardiogram examinations. COPD;                  Risk Factors:Current Smoker. ETOH abuse.  Sonographer:     Charmayne Sheer RDCS (AE) Referring Phys:  [id number] Cheryll Dessert Diagnosing Phys: Bartholome Bill MD  Sonographer Comments: Technically difficult study due to poor echo windows and echo performed with patient supine and on artificial respirator. Image acquisition challenging due to patient body habitus. IMPRESSIONS  1. Left ventricular ejection fraction, by estimation, is 55 to 60%. The left ventricle has normal function. Left ventricular endocardial border not optimally defined to evaluate regional wall motion. Left ventricular diastolic function could not be evaluated.  2. Right ventricular systolic function is normal. The right ventricular size is normal.  3. The mitral valve is grossly normal. No evidence of mitral valve regurgitation.  4. The aortic valve  was not well visualized. Aortic valve regurgitation is mild. Mild to moderate aortic valve stenosis. FINDINGS  Left Ventricle: Left ventricular ejection fraction, by estimation, is 55 to 60%. The left ventricle has normal function. Left ventricular endocardial border not optimally defined to evaluate regional wall motion. Definity contrast agent was given IV to delineate the left ventricular endocardial borders. The left ventricular internal cavity size was normal in size. There is no left ventricular hypertrophy. Left ventricular diastolic function could not be evaluated. Right Ventricle: The right ventricular size is normal. No increase in right ventricular wall thickness. Right ventricular systolic function is normal. Left Atrium: Left atrial size was normal in size. Right Atrium: Right atrial size was normal in size. Pericardium: There is no evidence of pericardial effusion. Mitral Valve: The mitral valve is grossly normal. No evidence of mitral valve regurgitation. MV peak gradient, 6.0 mmHg. The mean mitral valve gradient is 2.0 mmHg. Tricuspid Valve: The tricuspid valve is not well visualized. Tricuspid valve regurgitation is trivial. Aortic Valve: The aortic valve was not well visualized. Aortic valve regurgitation is mild. Aortic regurgitation PHT measures 376 msec. Mild to moderate aortic stenosis is present. Aortic valve mean gradient measures 16.0 mmHg. Aortic valve peak gradient  measures 32.6 mmHg. Aortic valve area, by VTI measures 0.98 cm. Pulmonic Valve: The pulmonic valve was not assessed. Pulmonic valve regurgitation is not visualized. Aorta: The aortic root was not well visualized. IAS/Shunts: The interatrial septum was not well visualized.  LEFT VENTRICLE PLAX 2D LVOT diam:     2.10 cm      Diastology LV SV:         44           LV e' medial:    3.92 cm/s LV SV Index:   20           LV E/e' medial:  26.0 LVOT Area:     3.46 cm     LV e' lateral:   4.90 cm/s  LV E/e'  lateral: 20.8  LV Volumes (MOD) LV vol d, MOD A2C: 107.0 ml LV vol d, MOD A4C: 80.6 ml LV vol s, MOD A2C: 39.1 ml LV vol s, MOD A4C: 32.0 ml LV SV MOD A2C:     67.9 ml LV SV MOD A4C:     80.6 ml LV SV MOD BP:      57.7 ml RIGHT VENTRICLE RV Basal diam:  3.41 cm LEFT ATRIUM             Index       RIGHT ATRIUM           Index LA Vol (A2C):   47.1 ml 21.31 ml/m RA Area:     10.50 cm LA Vol (A4C):   49.6 ml 22.44 ml/m RA Volume:   17.70 ml  8.01 ml/m LA Biplane Vol: 50.2 ml 22.71 ml/m  AORTIC VALVE AV Area (Vmax):    1.18 cm AV Area (Vmean):   1.03 cm AV Area (VTI):     0.98 cm AV Vmax:           285.33 cm/s AV Vmean:          182.000 cm/s AV VTI:            0.453 m AV Peak Grad:      32.6 mmHg AV Mean Grad:      16.0 mmHg LVOT Vmax:         97.30 cm/s LVOT Vmean:        53.900 cm/s LVOT VTI:          0.128 m LVOT/AV VTI ratio: 0.28 AI PHT:            376 msec MITRAL VALVE MV Area (PHT): 4.89 cm     SHUNTS MV Peak grad:  6.0 mmHg     Systemic VTI:  0.13 m MV Mean grad:  2.0 mmHg     Systemic Diam: 2.10 cm MV Vmax:       1.22 m/s MV Vmean:      66.2 cm/s MV Decel Time: 155 msec MV E velocity: 102.00 cm/s Harold Hedge MD Electronically signed by Harold Hedge MD Signature Date/Time: 07/16/2020/4:59:28 PM    Final      CULTURES: Blood cultures: pending   ANTIBIOTICS: None   SIGNIFICANT EVENTS:   LINES/TUBES: PIV Foley     ASSESSMENT / PLAN:  [name] is a 74 year old male with history of COPD, chronic diastolic heart failure, hypertension, morbid obesity, alcohol abuse, tobacco abuse and poor healthcare follow-up presenting with acute hypoxic respiratory failure, subsequently PEA arrest with ROSC after 15 minutes, decompensated heart failure.    Acute hypoxic respiratory failure Acute exacerbation of diastolic heart failure/ pulmonary edema COPD exacerbation Noted to have pink frothy secretions after intubation, and CXR showed increased interstitial markings suggestive of  pulmonary edema, and bilateral patchy airspace opacities.  Viral panel was negative for COVID and influenza A/B.  hsTrop was intially 162, then 297, BNP of 562 and lactic acid was 5.4.  Per son, no report of recent illness, illness appears quite acute.  Differential includes acute flash pulmonary edema/heart failure, acute COPD exacerbation, aspiration pneumonitis, pulmonary embolism or acute ischemic heart.  Poor window on bedside cardiac ECHO, but lung exam notable for B lines suggestive of interstitial process.  Leukocytosis likely stress/reactive due   Admit to medical ICU  Mechanical vent support -Lung protective w/ 6cc/kg PBW -Maintain Pplat < 30 -PEEP as tolerated  Diurese -- will repeat Lasix 40mg  IV  Strict I/Os  Monitor electrolytes  TTE in AM  Start heparin drip with significant bump in troponins and will also treat if any concern for PE   LE duplex  Tracheal aspirate for sputum culture  PEA arrest Non-sustained Vtach Type II MI PEA arrest likely result of acute respiratory failure; no strong evidence of ACS.  Runs of non-sustained Vtach   Trend troponin  Heparin drip for now  Aspirin  Statin  Continue amiodarone  AKI Cr 1.62, baseline 1.49 from 08/2019.  IV fluids -- give another LR 500 cc bolus  Strict I/Os  Avoid nephrotoxic agents  Renal US  Alcohol abuse  Thiamine, MVI and folic acid  Monitor for withdrawal symptoms  Tobacco abuse  Nicotine patch when awake  Hypertension  Hold any antihypertensive for now due to soft BP    Best Practice: Code Status:  DNR Diet:  NPO GI prophylaxis:  Pepcid VTE prophylaxis:  Heparin SQ  FAMILY  - Updates:  Spoke with 2 sons Antonio [name] and [facility] bedside.   Critical Care time Williamson DO Internal Medicine/Pediatrics Pulmonary and Critical Care Fellow PGY-7    07/16/2020, 7:44 PM

## 2020-07-16 NOTE — H&P (Deleted)
PULMONARY / CRITICAL Progress Note  Name: Antonio [name] MRN: [id number] DOB: February 20, 1946    LOS: 0  Admitting Provider:  Samuella Cota. Reinaldo Berber, MD Reason for Referral:   Cardiac arrest  Brief patient description:  [name] is a 74 year old male presenting with acute hypoxic respiratory failure, subsequently PEA arrest with ROSC after 15 minutes, decompensated heart failure.    HPI:  [name] is a 74 year old male with history of COPD, chronic diastolic heart failure, hypertension, morbid obesity, alcohol abuse, tobacco abuse and poor healthcare follow-up who was brought to the ED via EMS after his roommate called his son to tell him the patient was having difficulty breathing.  The son asked the roommate to called 60 which he did.    EMS found the patient to be in respiratory distress and was placed on BiPAP.  Just upon arrival to ED, the patient reportedly lost consciousness, and was found to be in PEA arrest.  He received about 4 rounds of epinephrine and achieved ROSC after approximately 15 minutes.  He was intubated.  After achieving ROSC, the patient was then having runs of non-sustained Vtach for which he was started on Amiodarone drip.  He is being sedated with propofol and has stable blood pressures without need for vasopressors.  He was noted to have pink frothy secretions after intubation, and CXR showed increased interstitial markings suggestive of pulmonary edema, and bilateral patchy airspace opacities.  Viral panel was negative for COVID and influenza A/B.  hsTrop was initially 162, then 297, BNP of 562 and lactic acid was 5.4.   The patient's 2 sons -- Antonio [name] and Antonio [name] were bedside, and has elected to make the patient DNR.    Of note, the patient was admitted here 07/27/19 to 08/20/19 for acute hypoxic and hypercapnic respiratory failure related to COPD and heart failure exacerbation for which he required intubation for nearly 2 weeks.   According the the  sons, the patient had not seen a PCP since his discharge from the hospital in Feb 2021, and has not been on any medications.  At baseline, he is fully functional, drives, does grocery shopping, etc, but has exercise limitations.  He lives alone but rents a room.  Per the sons, he sometimes drinks several cans of beer in day but is not a daily drinker. One of his sons says he spoke to him earlier in the morning, and he said he was feeling great.   Interval events:  Patient stable throughout the day. Renal function continues to worsen    Past Medical History:  Diagnosis Date  . COPD (chronic obstructive pulmonary disease) ([place])   . ETOH abuse   . Smoker    No past surgical history on file. No current facility-administered medications on file prior to encounter.   No current outpatient medications on file prior to encounter.    Allergies No Known Allergies  Family History No family history on file. Social History  reports that he has been smoking cigarettes. He has a 27.50 pack-year smoking history. He has never used smokeless tobacco. He reports that he does not drink alcohol and does not use drugs.  Review Of Systems:  All systems reviewed and negative except as noted in HPI.  VITAL SIGNS: BP (!) 107/42   Pulse 71   Temp 100.22 F (37.9 C)   Resp (!) 24   Ht 5\' 6"  (1.676 m)   Wt 114.8 kg   SpO2 99%  BMI 40.85 kg/m   HEMODYNAMICS:    VENTILATOR SETTINGS: Vent Mode: PRVC FiO2 (%):  [50 %-100 %] 50 % Set Rate:  [18 bmp-24 bmp] 24 bmp Vt Set:  [450 mL] 450 mL PEEP:  [5 cmH20-8 cmH20] 5 cmH20  INTAKE / OUTPUT: I/O last 3 completed shifts: In: 460.3 [I.V.:410.3; IV Piggyback:50] Out: 1150 [Urine:1150]  PHYSICAL EXAMINATION: General:  Sedated on mechanical vent, in NAD, 2 sons and daughter-in-law bedside HEENT:  NCAT, sclera anicteric, dry MM, ETT in place Neuro:  Sedated on vent Cardiovascular:  S1S2 present, RRR, no audible murmur, gallops or rubs Lungs:   Bilateral crackles and coarseness, non-labored Abdomen:  Obese, soft, +BS Musculoskeletal:  1+ edema bilateral LEs, no obvious calf tenderness, warmth or erythema Skin:  Warm and dry  LABS:  BMET Recent Labs  Lab 06/29/2020 2113 07/16/20 0510 07/16/20 1418  NA 139 137 137  K 3.9 4.4 4.9  CL 104 103 104  CO2 22 25 24   BUN 15 21 26*  CREATININE 1.62* 1.62* 2.03*  GLUCOSE 312* 143* 120*    Electrolytes Recent Labs  Lab 07/13/2020 2113 07/16/20 0510 07/16/20 1418  CALCIUM 8.0* 8.0* 7.9*  MG 2.4 2.0  --   PHOS  --  3.5  --     CBC Recent Labs  Lab 07/03/2020 2113 07/16/20 0510  WBC 21.7* 14.3*  HGB 14.1 14.4  HCT 44.8 42.9  PLT 323 284    Coag's Recent Labs  Lab 07/16/20 0634  APTT 58*  INR 1.2    Sepsis Markers Recent Labs  Lab 06/24/2020 2246 07/16/20 0511 07/16/20 0634 07/16/20 1224  LATICACIDVEN 2.1* 1.1 1.1  --   PROCALCITON  --   --   --  5.62    ABG Recent Labs  Lab 07/16/20 0049  PHART 7.34*  PCO2ART 45  PO2ART 167*    Liver Enzymes Recent Labs  Lab 06/25/2020 2113 07/16/20 0510  AST 55* 47*  ALT 35 38  ALKPHOS 75 60  BILITOT 0.8 0.6  ALBUMIN 3.4* 3.4*    Cardiac Enzymes No results for input(s): TROPONINI, PROBNP in the last 168 hours.  Glucose Recent Labs  Lab 07/16/20 0748 07/16/20 1102 07/16/20 1613  GLUCAP 126* 120* 119*    Imaging US Venous Img Lower Bilateral (DVT)  Result Date: 07/16/2020 CLINICAL DATA:  74 year old male with history of swelling EXAM: BILATERAL LOWER EXTREMITY VENOUS DOPPLER ULTRASOUND TECHNIQUE: Gray-scale sonography with graded compression, as well as color Doppler and duplex ultrasound were performed to evaluate the lower extremity deep venous systems from the level of the common femoral vein and including the common femoral, femoral, profunda femoral, popliteal and calf veins including the posterior tibial, peroneal and gastrocnemius veins when visible. The superficial great saphenous vein was  also interrogated. Spectral Doppler was utilized to evaluate flow at rest and with distal augmentation maneuvers in the common femoral, femoral and popliteal veins. COMPARISON:  None. FINDINGS: RIGHT LOWER EXTREMITY Common Femoral Vein: No evidence of thrombus. Normal compressibility, respiratory phasicity and response to augmentation. Saphenofemoral Junction: No evidence of thrombus. Normal compressibility and flow on color Doppler imaging. Profunda Femoral Vein: No evidence of thrombus. Normal compressibility and flow on color Doppler imaging. Femoral Vein: No evidence of thrombus. Normal compressibility, respiratory phasicity and response to augmentation. Popliteal Vein: No evidence of thrombus. Normal compressibility, respiratory phasicity and response to augmentation. Calf Veins: Posterior tibial vein patent with compressibility. Limited visualization of the peroneal vein. Superficial Great Saphenous Vein: No evidence of thrombus.  Normal compressibility and flow on color Doppler imaging. Other Findings:  None. LEFT LOWER EXTREMITY Common Femoral Vein: No evidence of thrombus. Normal compressibility, respiratory phasicity and response to augmentation. Saphenofemoral Junction: No evidence of thrombus. Normal compressibility and flow on color Doppler imaging. Profunda Femoral Vein: No evidence of thrombus. Normal compressibility and flow on color Doppler imaging. Femoral Vein: No evidence of thrombus. Normal compressibility, respiratory phasicity and response to augmentation. Popliteal Vein: No evidence of thrombus. Normal compressibility, respiratory phasicity and response to augmentation. Calf Veins: Posterior tibial vein patent with compressibility. Limited evaluation of the peroneal vein. Superficial Great Saphenous Vein: No evidence of thrombus. Normal compressibility and flow on color Doppler imaging. Other Findings:  None. IMPRESSION: Sonographic survey of the bilateral lower extremities negative for DVT  Electronically Signed   By: Corrie Mckusick D.O.   On: 07/16/2020 11:54   DG Chest Port 1 View  Result Date: 07/16/2020 CLINICAL DATA:  Shortness of breath, cardiac arrest EXAM: PORTABLE CHEST 1 VIEW COMPARISON:  07/16/2020 FINDINGS: Mild bibasilar opacities, likely reflecting small bilateral pleural effusions, right greater than left. Associated right lower lobe atelectasis. No frank interstitial edema. No pneumothorax. Endotracheal tube terminates 15 mm above the carina. Enteric tube courses into the mid stomach. The heart is normal in size. IMPRESSION: Small bilateral pleural effusions, right greater than left. Associated right lower lobe atelectasis. Endotracheal tube terminates 15 mm above the carina. Enteric tube courses into the mid stomach. Electronically Signed   By: Julian Hy M.D.   On: 07/16/2020 11:55   DG Chest Portable 1 View  Result Date: 07/16/2020 CLINICAL DATA:  OG tube placement EXAM: PORTABLE CHEST 1 VIEW COMPARISON:  None. FINDINGS: Tip of the OG tube is seen within the proximal stomach. Hazy airspace opacities seen within both lower lungs. IMPRESSION: Tip of the OG tube within the proximal stomach. Electronically Signed   By: Prudencio Pair M.D.   On: 07/16/2020 00:47   DG Chest Portable 1 View  Result Date: 07/06/2020 CLINICAL DATA:  Intubation EXAM: PORTABLE CHEST 1 VIEW COMPARISON:  Chest x-ray 08/21/2019 FINDINGS: Interval placement of an endotracheal tube with tip 7 cm above the carina. Enteric tube with tip overlying the expected region of the gastroesophageal junction and side port overlying the expected region of distal esophagus. Cardiac paddles overlie the chest. The heart size and mediastinal contours are unchanged with cardiomegaly. Increased interstitial markings within bilateral upper lobes. Diffuse vague patchy airspace opacities that are more prominent by within the bilateral lower lobes. No pleural effusion. No pneumothorax. No acute osseous abnormality.  IMPRESSION: 1. Endotracheal tube with tip 7 cm above the carina. 2. Enteric tube with tip overlying the expected region of the gastroesophageal junction and side port the expected region of distal esophagus. Recommend advancement by 10 cm. 3. Pulmonary edema. 4. Bilateral lower lobe patchy airspace opacities could represent infection/inflammation. Electronically Signed   By: Iven Finn M.D.   On: 07/03/2020 21:08     CULTURES: Blood cultures: pending   ANTIBIOTICS: None   SIGNIFICANT EVENTS:   LINES/TUBES: PIV Foley     ASSESSMENT / PLAN:  Mr. Jebediah Magner is a 74 year old male with history of COPD, chronic diastolic heart failure, hypertension, morbid obesity, alcohol abuse, tobacco abuse and poor healthcare follow-up presenting with acute hypoxic respiratory failure, subsequently PEA arrest with ROSC after 15 minutes, decompensated heart failure.    Acute hypoxic respiratory failure Acute exacerbation of diastolic heart failure/ pulmonary edema COPD exacerbation Noted to have pink  frothy secretions after intubation, and CXR showed increased interstitial markings suggestive of pulmonary edema, and bilateral patchy airspace opacities.  Viral panel was negative for COVID and influenza A/B.  hsTrop was intially 162, then 297, BNP of 562 and lactic acid was 5.4.  Per son, no report of recent illness, illness appears quite acute.  Differential includes acute flash pulmonary edema/heart failure, acute COPD exacerbation, aspiration pneumonitis, pulmonary embolism or acute ischemic heart.  Poor window on bedside cardiac ECHO, but lung exam notable for B lines suggestive of interstitial process.  Leukocytosis likely stress/reactive due   Admit to medical ICU  Mechanical vent support -Lung protective w/ 6cc/kg PBW -Maintain Pplat < 30 -PEEP as tolerated  Diurese -- will repeat Lasix 40mg  IV  Strict I/Os  Monitor electrolytes  TTE in AM  Start heparin drip with significant  bump in troponins and will also treat if any concern for PE   LE duplex  Tracheal aspirate for sputum culture  PEA arrest Non-sustained Vtach Type II MI PEA arrest likely result of acute respiratory failure; no strong evidence of ACS.  Runs of non-sustained Vtach   Trend troponin  Heparin drip for now  Aspirin  Statin  Continue amiodarone  AKI Cr 1.62, baseline 1.49 from 08/2019.  IV fluids -- give another LR 500 cc bolus  Strict I/Os  Avoid nephrotoxic agents  Renal 09/2019  Alcohol abuse  Thiamine, MVI and folic acid  Monitor for withdrawal symptoms  Tobacco abuse  Nicotine patch when awake  Hypertension  Hold any antihypertensive for now due to soft BP    Best Practice: Code Status:  DNR Diet:  NPO GI prophylaxis:  Pepcid VTE prophylaxis:  Heparin SQ  FAMILY  - Updates:  Spoke with 2 sons Antonio [name] and Antonio [name] bedside.   Critical Care time 13  32 DO Internal Medicine/Pediatrics Pulmonary and Critical Care Fellow PGY-7    07/16/2020, 4:36 PM

## 2020-07-16 NOTE — Consult Note (Signed)
Pharmacy Antibiotic Note  Antonio [name] is a 74 y.o. male with medical history including COPD, dCHF, HTN, morbid obesity, alcohol abuse, tobacco abuse admitted on 07/05/2020 with acute hypoxic respiratory failure, PEA arrest, NSVT, type II NSTEMI.  Pharmacy has been consulted for Zosyn dosing.  Plan: Zosyn 3.375g IV q8h (4 hour infusion).   Scr trending up. Poor UOP. Continue to monitor renal function closely and adjust antibiotics as indicated.   Height: 5\' 6"  (167.6 cm) Weight: 114.8 kg (253 lb 1.4 oz) IBW/kg (Calculated) : 63.8  Temp (24hrs), Avg:98.4 F (36.9 C), Min:94.7 F (34.8 C), Max:99.86 F (37.7 C)  Recent Labs  Lab 06/19/2020 2113 07/05/2020 2246 07/16/20 0510 07/16/20 0511 07/16/20 0634 07/16/20 1418  WBC 21.7*  --  14.3*  --   --   --   CREATININE 1.62*  --  1.62*  --   --  2.03*  LATICACIDVEN 5.4* 2.1*  --  1.1 1.1  --     Estimated Creatinine Clearance: 37.4 mL/min (A) (by C-G formula based on SCr of 2.03 mg/dL (H)).    No Known Allergies  Antimicrobials this admission: Zosyn 12/29 >>   Dose adjustments this admission: N/A  Microbiology results: 12/28 BCx: NGTD 12/20 MRSA PCR: (+)  Thank you for allowing pharmacy to be a part of this patient's care.  1/21 07/16/2020 3:41 PM

## 2020-07-16 NOTE — ED Notes (Addendum)
As per charge, pt can go up and ICU is waiting for pt.  RT at bedside. Pt going to ICU in bed on cardiac, and pulse ox monitor and zoll.

## 2020-07-16 NOTE — Progress Notes (Signed)
Initial Nutrition Assessment  DOCUMENTATION CODES:   Morbid obesity  INTERVENTION:  Initiate Vital High Protein at 15 mL/hr and advance by 15 mL/hr every 8 hours to goal rate of 45 mL/hr (1080 mL goal daily volume). Also provide PROSource TF 90 mL TID per tube. Provides 1320 kcal, 160 grams of protein, 907 mL H2O daily. With current propofol rate provides 1605 kcal daily.  Continue MVI daily per tube.  Monitor magnesium, potassium, and phosphorus daily for at least 3 days, MD to replete as needed, as pt is at risk for refeeding syndrome.  NUTRITION DIAGNOSIS:   Inadequate oral intake related to inability to eat as evidenced by NPO status.  GOAL:   Patient will meet greater than or equal to 90% of their needs  MONITOR:   Vent status,Labs,Weight trends,TF tolerance,I & O's  REASON FOR ASSESSMENT:   Ventilator,Consult Enteral/tube feeding initiation and management  ASSESSMENT:   74 year old male with PMHx of COPD, EtOH abuse, tobacco use, chronic diastolic heart failure, HTN admitted with acute hypoxic respiratory failure, subsequent PEA arrest with ROSC after 15 minutes, decompensated heart failure.   12/28 intubated  Patient is currently intubated on ventilator support MV: 10.7 L/min Temp (24hrs), Avg:98.4 F (36.9 C), Min:94.7 F (34.8 C), Max:99.86 F (37.7 C)  Propofol: 10.8 ml/hr (285 kcal daily)  Medications reviewed and include: Colace 100 mg BID, folic acid 2 mg daily, MVI daily, thiamine 100 mg daily, amiodarone infusion, famotidine, fentanyl gtt, heparin gtt, propofol gtt.  Labs reviewed: CBG 120-126, Creatinine 1.62.  I/O: 1150 mL UOP Yesterday  Enteral Access: 18 Fr. OGT placed 12/29; courses into mid stomach per chest x-ray 12/29  Discussed with RN and on rounds. Plan is to start tube feeds today.  NUTRITION - FOCUSED PHYSICAL EXAM:  Flowsheet Row Most Recent Value  Orbital Region No depletion  Upper Arm Region No depletion  Thoracic and Lumbar  Region No depletion  Buccal Region Unable to assess  Temple Region No depletion  Clavicle Bone Region No depletion  Clavicle and Acromion Bone Region No depletion  Scapular Bone Region Unable to assess  Dorsal Hand No depletion  Patellar Region No depletion  Anterior Thigh Region No depletion  Posterior Calf Region No depletion  Edema (RD Assessment) None  Hair Reviewed  Eyes Unable to assess  Mouth Unable to assess  Skin Reviewed  Nails Reviewed     Diet Order:   Diet Order    None     EDUCATION NEEDS:   No education needs have been identified at this time  Skin:  Skin Assessment: Reviewed RN Assessment  Last BM:  07/16/2020 per chart  Height:   Ht Readings from Last 1 Encounters:  07/16/20 5\' 6"  (1.676 m)   Weight:   Wt Readings from Last 1 Encounters:  07/16/20 114.8 kg   Ideal Body Weight:  64.5 kg  BMI:  Body mass index is 40.85 kg/m.  Estimated Nutritional Needs:   Kcal:  07/18/20 (11-14 kcal/kg)  Protein:  161 grams (2.5 grams/kg IBW)  Fluid:  1.8-2 L/day  [phone], MS, RD, LDN Pager number available on Amion

## 2020-07-16 NOTE — Consult Note (Signed)
ANTICOAGULATION CONSULT NOTE  Pharmacy Consult for Heparin Infusion Indication: chest pain/ACS  Patient Measurements: Heparin Dosing Weight: 90.3 kg  Labs: Recent Labs    07/12/2020 2113 07/11/2020 2252 07/16/20 0510 07/16/20 0634 07/16/20 1224  HGB 14.1  --  14.4  --   --   HCT 44.8  --  42.9  --   --   PLT 323  --  284  --   --   APTT  --   --   --  58*  --   LABPROT  --   --   --  14.7  --   INR  --   --   --  1.2  --   HEPARINUNFRC  --   --   --   --  0.52  CREATININE 1.62*  --  1.62*  --   --   TROPONINIHS 162* 297* 1,455* 1,380*  --     Estimated Creatinine Clearance: 46.9 mL/min (A) (by C-G formula based on SCr of 1.62 mg/dL (H)).   Medical History: Past Medical History:  Diagnosis Date  . COPD (chronic obstructive pulmonary disease) ([facility])   . ETOH abuse   . Smoker     Medications:  No anticoagulation prior to admission per my chart review  Assessment: Patient is a 74 y/o M with medical history as above who presented to the ED 12/28 with acute hypoxic respiratory failure with subsequent PEA arrest with ROSC after 15 minutes, decompensated heart failure. Patient was intubated and remains sedated on mechanical ventilation in the ICU. Pharmacy consulted for heparin infusion for ACS.  Baseline aPTT 58s (drawn after initiation of heparin infusion), INR 1.2. Baseline CBC with H&H, platelets within normal limits.  Heparin course: 12/29 at 0346: heparin drip started at 1600 units/hr 12/29 at 1224: HL = 0.52, therapeutic x 1. Continue heparin infusion at 1600 units/hr  Goal of Therapy:  Heparin level 0.3-0.7 units/ml Monitor platelets by anticoagulation protocol: Yes   Plan:  --12/29 at 1224 HL = 0.52, therapeutic x 1. Continue heparin infusion at 1600 units/hr --Will re-check confirmatory HL at 2000 --Daily CBC per protocol while on heparin infusion  Tressie Ellis 07/16/2020,1:37 PM

## 2020-07-16 NOTE — Progress Notes (Signed)
Called eICU RN on call to inform of patient's current hypotension (map <65). Awaiting orders.

## 2020-07-16 NOTE — Progress Notes (Signed)
eLink Physician-Brief Progress Note Patient Name: Antonio [name] DOB: 1945-08-17 MRN: [id number]   Date of Service  07/16/2020  HPI/Events of Note  Hypotension - BP = 99/45 with MAP = 61. No CVL or CVP.   eICU Interventions  Plan: 1. Phenylephrine IV infusion via PIV. Titrate to MAP >= 65.  2. Bedside nurse to inform PCCM bedside coverage of hypotension for further management.      Intervention Category Major Interventions: Hypotension - evaluation and management  [name] 07/16/2020, 10:31 PM

## 2020-07-16 NOTE — ED Notes (Signed)
Provider notified of og placement.

## 2020-07-16 NOTE — Progress Notes (Signed)
*  PRELIMINARY RESULTS* Echocardiogram 2D Echocardiogram has been performed.  Joanette Gula Angelys Yetman 07/16/2020, 2:53 PM

## 2020-07-16 NOTE — ED Notes (Signed)
Date and time results received: 07/16/20 00:31 (use smartphrase ".now" to insert current time)  Test: Troponin Critical Value: 297  Name of Provider Notified: Dr. Don Perking  Orders Received? Or Actions Taken?: provider notified.

## 2020-07-17 ENCOUNTER — Inpatient Hospital Stay: Payer: Medicare Other

## 2020-07-17 DIAGNOSIS — I214 Non-ST elevation (NSTEMI) myocardial infarction: Secondary | ICD-10-CM

## 2020-07-17 LAB — GLUCOSE, CAPILLARY
Glucose-Capillary: 88 mg/dL (ref 70–99)
Glucose-Capillary: 94 mg/dL (ref 70–99)
Glucose-Capillary: 84 mg/dL (ref 70–99)
Glucose-Capillary: 90 mg/dL (ref 70–99)
Glucose-Capillary: 85 mg/dL (ref 70–99)
Glucose-Capillary: 81 mg/dL (ref 70–99)
Glucose-Capillary: 101 mg/dL — ABNORMAL HIGH (ref 70–99)

## 2020-07-17 LAB — BASIC METABOLIC PANEL
Anion gap: 10 (ref 5–15)
BUN: 36 mg/dL — ABNORMAL HIGH (ref 8–23)
CO2: 24 mmol/L (ref 22–32)
Calcium: 7.5 mg/dL — ABNORMAL LOW (ref 8.9–10.3)
Chloride: 102 mmol/L (ref 98–111)
Creatinine, Ser: 2.4 mg/dL — ABNORMAL HIGH (ref 0.61–1.24)
GFR, Estimated: 27 mL/min — ABNORMAL LOW (ref >60)
Glucose, Bld: 98 mg/dL (ref 70–99)
Potassium: 4 mmol/L (ref 3.5–5.1)
Sodium: 136 mmol/L (ref 135–145)

## 2020-07-17 LAB — MAGNESIUM
Magnesium: 2.1 mg/dL (ref 1.7–2.4)
Magnesium: 2.2 mg/dL (ref 1.7–2.4)

## 2020-07-17 LAB — PROCALCITONIN: Procalcitonin: 2.71 ng/mL

## 2020-07-17 LAB — CBC
HCT: 38.6 % — ABNORMAL LOW (ref 39.0–52.0)
Hemoglobin: 12.4 g/dL — ABNORMAL LOW (ref 13.0–17.0)
MCH: 32.4 pg (ref 26.0–34.0)
MCHC: 32.1 g/dL (ref 30.0–36.0)
MCV: 100.8 fL — ABNORMAL HIGH (ref 80.0–100.0)
Platelets: 288 10*3/uL (ref 150–400)
RBC: 3.83 MIL/uL — ABNORMAL LOW (ref 4.22–5.81)
RDW: 14.5 % (ref 11.5–15.5)
WBC: 15.1 10*3/uL — ABNORMAL HIGH (ref 4.0–10.5)
nRBC: 0 % (ref 0.0–0.2)

## 2020-07-17 LAB — PHOSPHORUS
Phosphorus: 3.7 mg/dL (ref 2.5–4.6)
Phosphorus: 3.5 mg/dL (ref 2.5–4.6)

## 2020-07-17 LAB — HEPARIN LEVEL (UNFRACTIONATED)
Heparin Unfractionated: >3.6 IU/mL — ABNORMAL HIGH (ref 0.30–0.70)
Heparin Unfractionated: 0.49 IU/mL (ref 0.30–0.70)

## 2020-07-17 LAB — STREP PNEUMONIAE URINARY ANTIGEN: Strep Pneumo Urinary Antigen: NEGATIVE

## 2020-07-17 LAB — TRIGLYCERIDES: Triglycerides: 68 mg/dL (ref <150)

## 2020-07-17 IMAGING — CT CT HEAD W/O CM
3 series · 15 of 47 positions shown, 18 images · non-contrast
Comparison: CT head without contrast [DATE]

CLINICAL DATA: Mental status change. Patient intubated. Status post
cardiac arrest.

EXAM:
CT HEAD WITHOUT CONTRAST
TECHNIQUE: Contiguous axial images were obtained from the base of the skull
through the vertex without intravenous contrast.

[Series 2: head wo · axial · 0.48mm/px · z∈[+316,+461]mm · 9 of 35 slices shown, 12 images]
[im 3/35  brain]
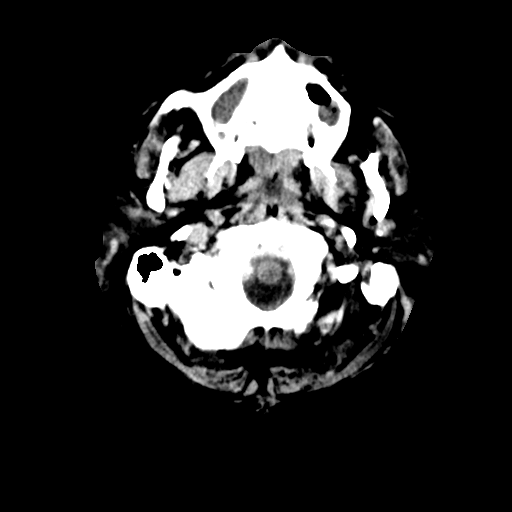
[im 3/35  bone]
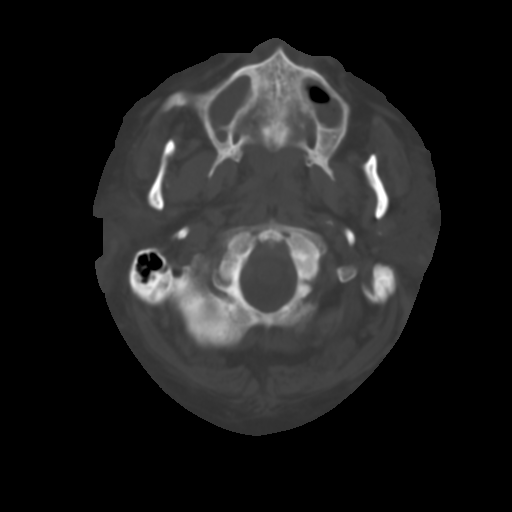
[im 6/35  brain]
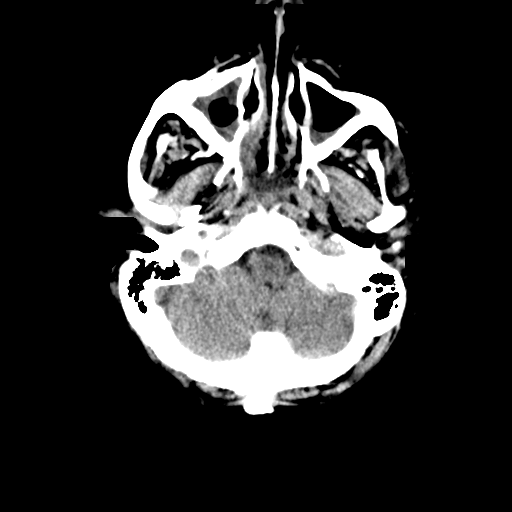
[im 10/35  brain]
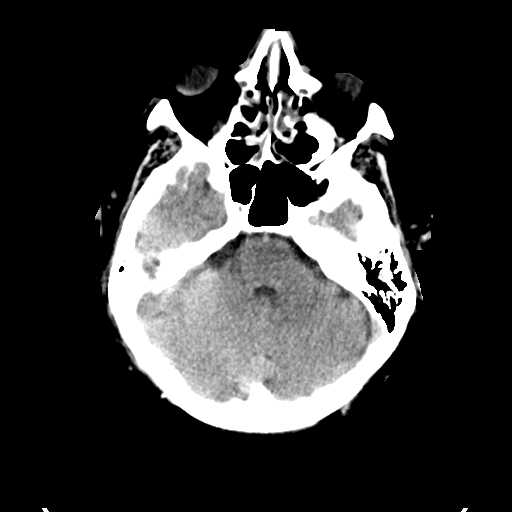
[im 13/35  brain]
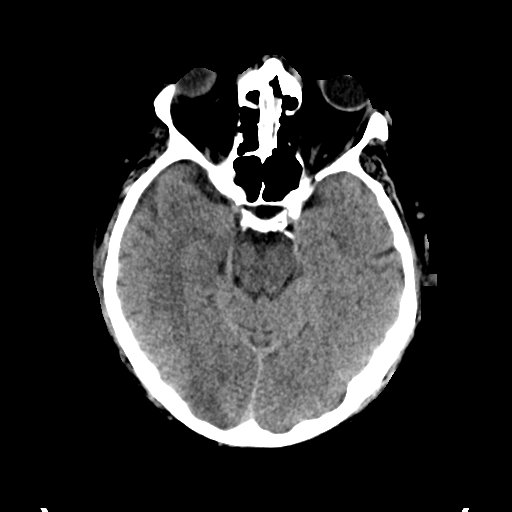
[im 18/35  brain]
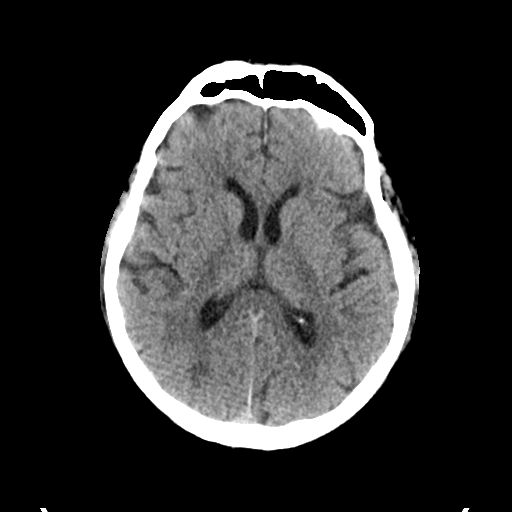
[im 18/35  bone]
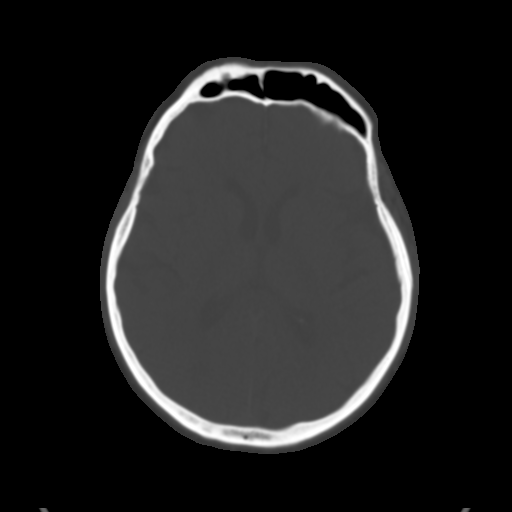
[im 22/35  brain]
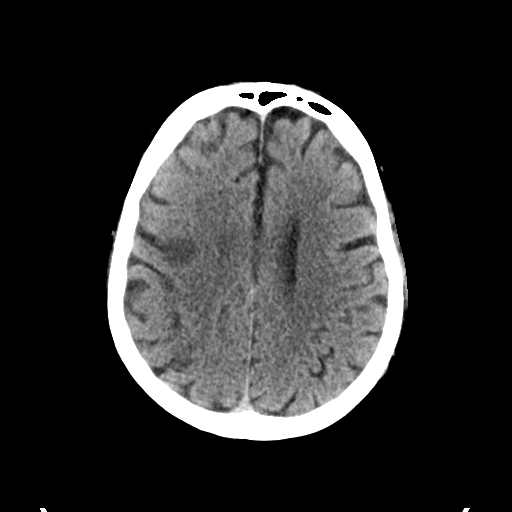
[im 25/35  brain]
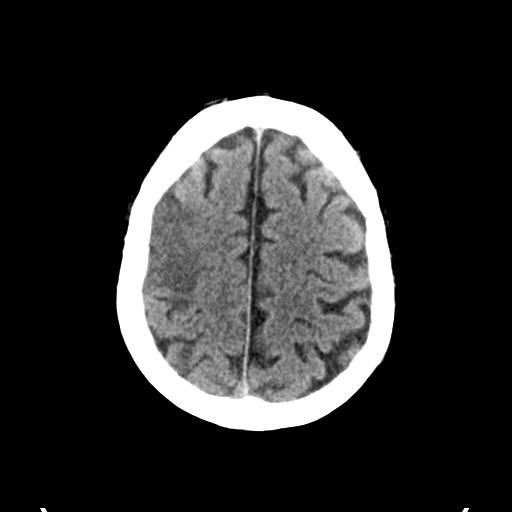
[im 29/35  brain]
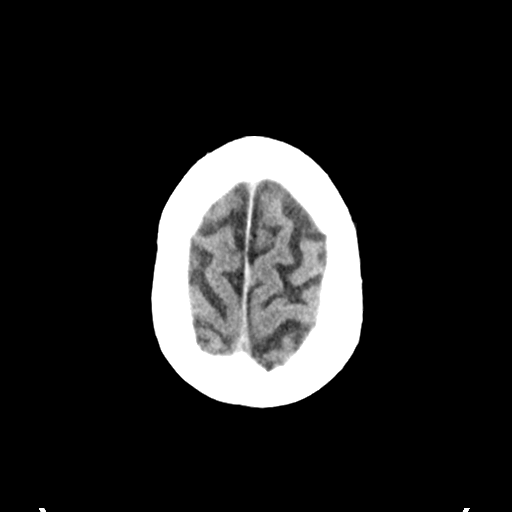
[im 32/35  brain]
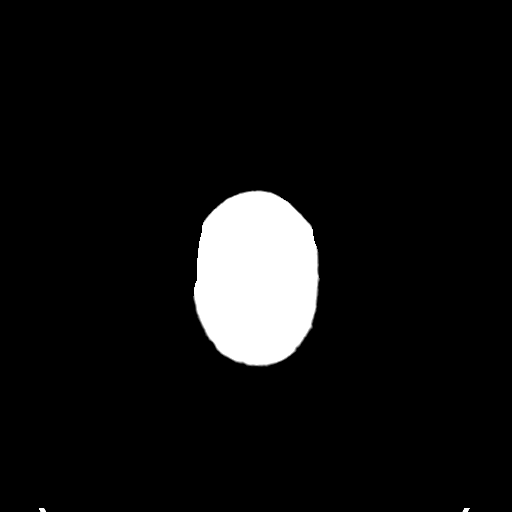
[im 32/35  bone]
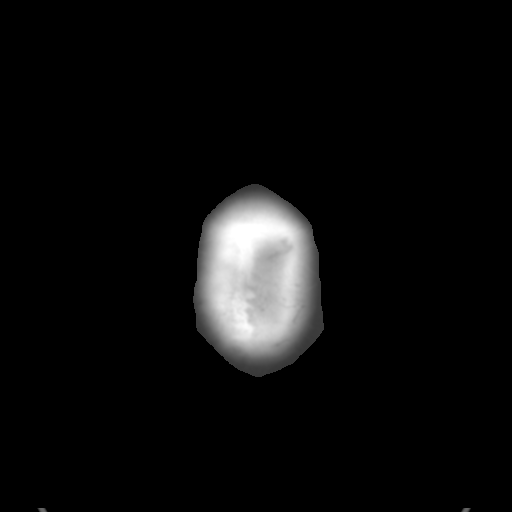

[Series 4: coronal soft tissue · coronal · 0.35mm/px · 3 of 70 slices shown]
[im 24/70  brain]
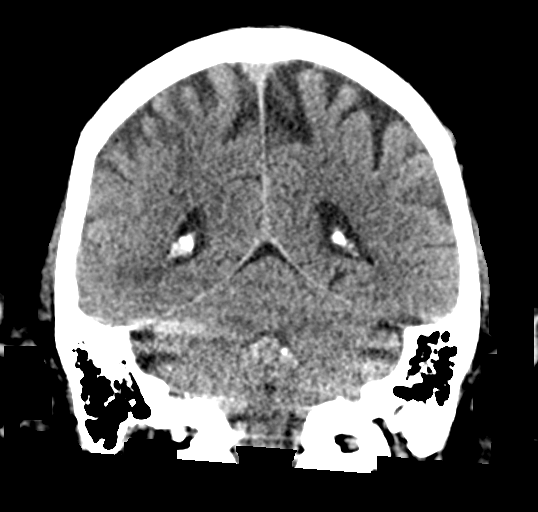
[im 31/70  brain]
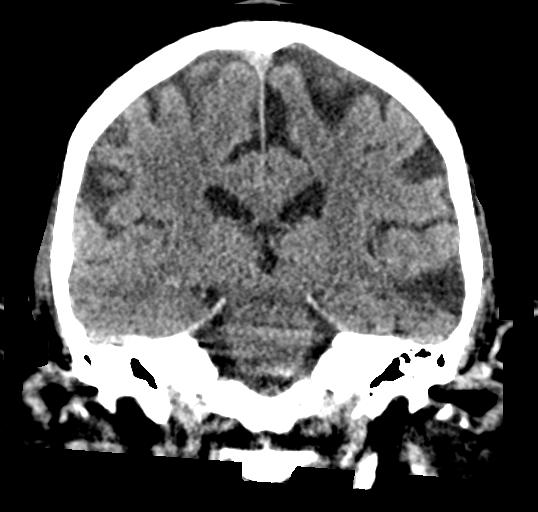
[im 39/70  brain]
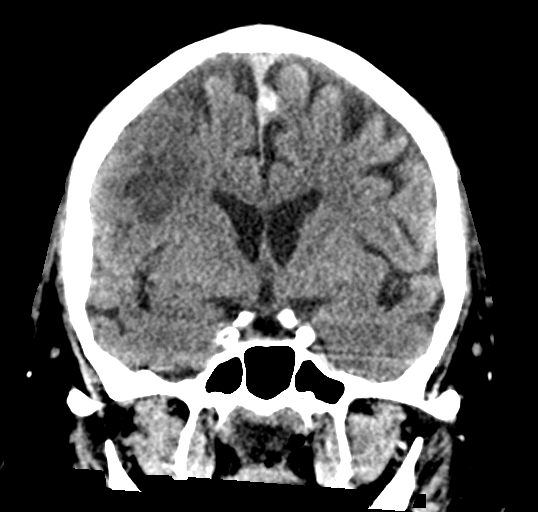

[Series 5: sagittal soft tissue · sagittal · 0.35mm/px · 3 of 64 slices shown]
[im 22/64  brain]
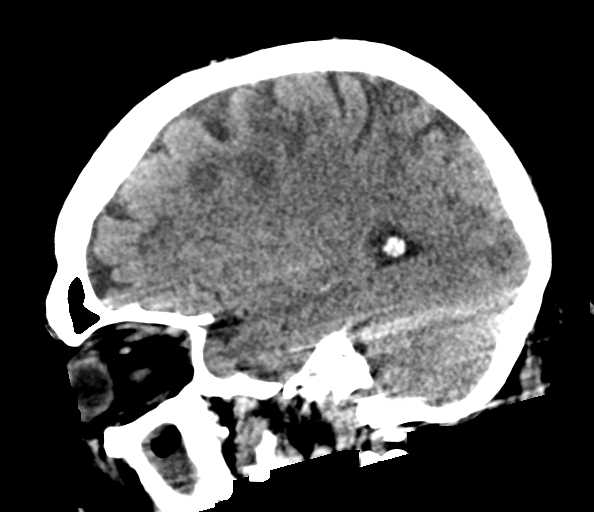
[im 32/64  brain]
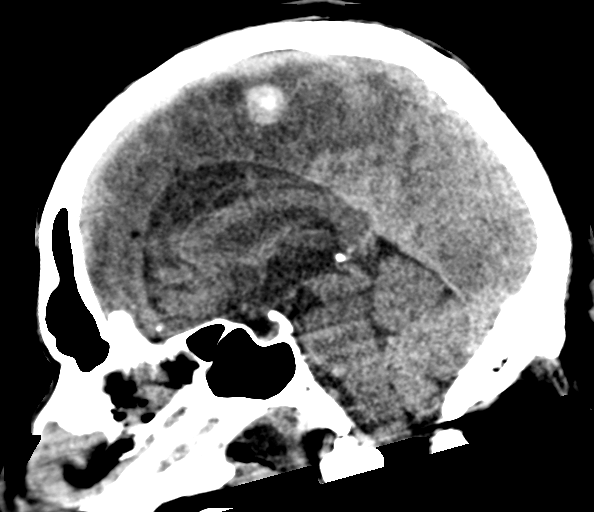
[im 43/64  brain]
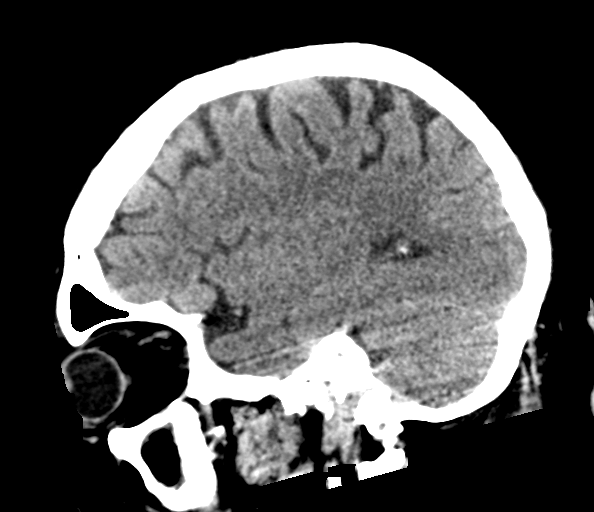

[15 of 47 positions shown; findings below may reference images not displayed]

FINDINGS: Brain: Patchy areas of hypoattenuation are present in the right ACA
and PCA territories. There is focal loss cortical hyperintensity
sulcal effacement. Right ACA territory is spared. Mild white matter
changes are present bilaterally otherwise. A remote left temporal
lobe infarct is stable. Left hemisphere is otherwise unremarkable.
Basal ganglia are within normal limits bilaterally.

Brainstem and cerebellum are normal.

Left para falcine meningioma is stable.

Vascular: Atherosclerotic calcifications are present within the
cavernous internal carotid arteries bilaterally. No hyperdense
vessel is present.

Skull: Insert normal skull No significant extracranial soft tissue
lesion is present.

Sinuses/Orbits: Patient is intubated. Fluid is present nasopharynx.
Bilateral maxillary antrostomies are present. Diffuse mucosal
thickening is present maxillary sinuses bilaterally without evidence
of chronic disease. Scattered ethmoid opacification present well.
The mastoid air cells are clear.

Bilateral exophthalmos is chronic. Globes and orbits are otherwise
within normal limits.
IMPRESSION: 1. Patchy areas of hypoattenuation in the right ACA and PCA
territories compatible with acute/subacute infarcts. Local mass
effect present with some effacement of the sulci but no midline
shift.
2. No acute hemorrhage.
3. Remote left temporal lobe infarct.
4. Atherosclerosis.

These results were called by telephone at the time of interpretation
on [DATE] at [DATE] to provider DOSUN TILE, NP , who
verbally acknowledged these results.

## 2020-07-17 IMAGING — DX DG CHEST 1V PORT
1 series · 1 of 1 positions shown · non-contrast
Comparison: Single-view of the chest [DATE].

CLINICAL DATA: Intubated patient status post acute hypoxic
respiratory failure [DATE].

EXAM:
PORTABLE CHEST 1 VIEW

[chest ap]
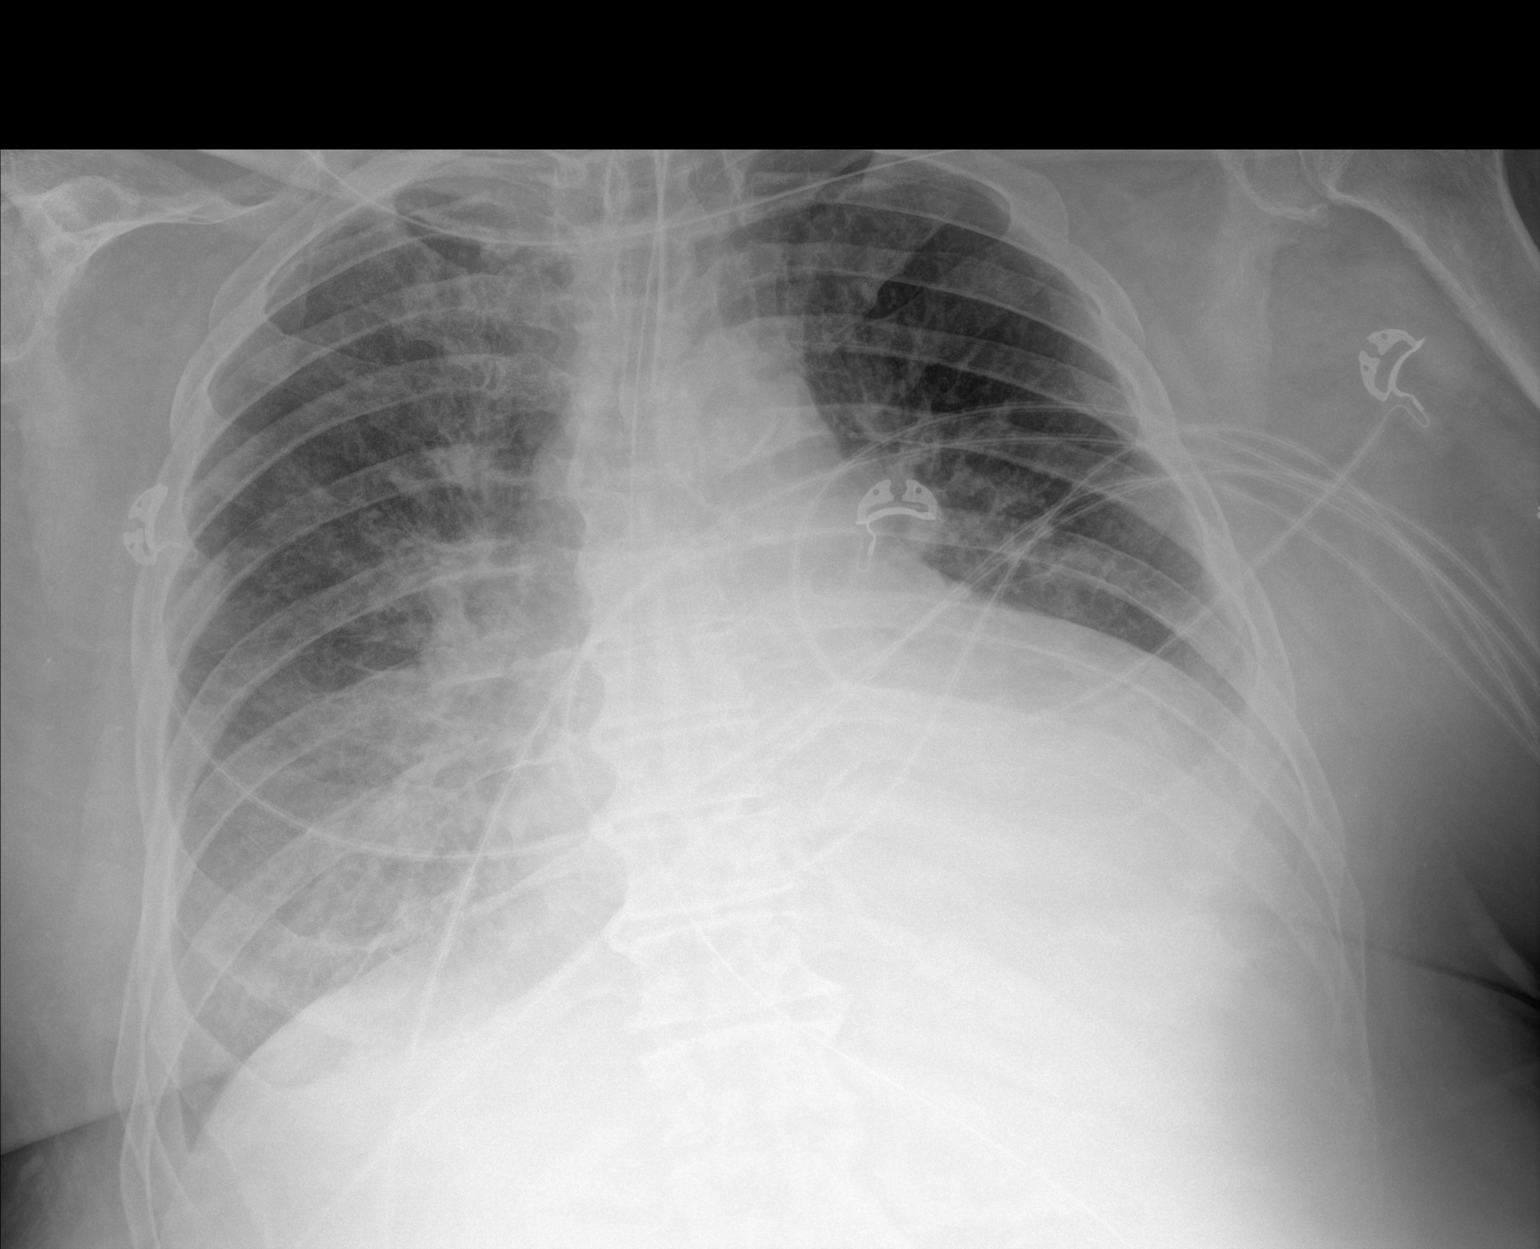

[1 of 1 positions shown; findings below may reference images not displayed]

FINDINGS: NG tube courses into the stomach and below the inferior margin of
the film. Endotracheal tube tip is approximately 2 cm above the
carina. Bibasilar airspace disease and effusions persist. There is
cardiomegaly. No pneumothorax.
IMPRESSION: No change in bilateral effusions and airspace disease.

## 2020-07-17 MED ORDER — MIDAZOLAM HCL 2 MG/2ML IJ SOLN
INTRAMUSCULAR | Status: AC
  Administered 2020-07-17: 12:00:00 2 mg via INTRAVENOUS
  Filled 2020-07-17: qty 2

## 2020-07-17 MED ORDER — MIDAZOLAM HCL 2 MG/2ML IJ SOLN: 2.0000 mg | Freq: Once | INTRAMUSCULAR | Status: AC

## 2020-07-17 MED ORDER — ATORVASTATIN CALCIUM 20 MG PO TABS
40.0000 mg | ORAL_TABLET | Freq: Every day | ORAL | Status: DC
  Administered 2020-07-18 – 2020-07-19 (×2): 40 mg
  Filled 2020-07-17 (×2): qty 2

## 2020-07-17 MED ORDER — FAMOTIDINE 20 MG PO TABS
20.0000 mg | ORAL_TABLET | Freq: Two times a day (BID) | ORAL | Status: DC
  Administered 2020-07-17 – 2020-07-19 (×4): 20 mg
  Filled 2020-07-17 (×4): qty 1

## 2020-07-17 MED ORDER — ACETAMINOPHEN 325 MG PO TABS: 650.0000 mg | ORAL_TABLET | Freq: Four times a day (QID) | ORAL | Status: DC | PRN

## 2020-07-17 NOTE — Progress Notes (Signed)
NAME:  Antonio [name], MRN:  [id number], DOB:  03-02-1946, LOS: 1 ADMISSION DATE:  07/17/2020, CONSULTATION DATE:  07/16/2020 REFERRING MD:  Dr. Charna Archer, CHIEF COMPLAINT:  Cardiac arrest  Brief History:  [name] is a 74 year old male presenting with acute hypoxic respiratory failure, subsequently PEA arrest with ROSC after 15 minutes, decompensated heart failure.   History of Present Illness:  [name] is a 74 year old male with history of COPD, chronic diastolic heart failure, hypertension, morbid obesity, alcohol abuse, tobacco abuse and poor healthcare follow-up who was brought to the ED via EMS after his roommate called his son to tell him the patient was having difficulty breathing.  The son asked the roommate to called 44 which he did.    EMS found the patient to be in respiratory distress and was placed on BiPAP.  Just upon arrival to ED, the patient reportedly lost consciousness, and was found to be in PEA arrest.  He received about 4 rounds of epinephrine and achieved ROSC after approximately 15 minutes.  He was intubated.  After achieving ROSC, the patient was then having runs of non-sustained Vtach for which he was started on Amiodarone drip.  He is being sedated with propofol and has stable blood pressures without need for vasopressors.  He was noted to have pink frothy secretions after intubation, and CXR showed increased interstitial markings suggestive of pulmonary edema, and bilateral patchy airspace opacities.  Viral panel was negative for COVID and influenza A/B.  hsTrop was initially 162, then 297, BNP of 562 and lactic acid was 5.4.   The patient's 2 sons -- Antonio [name] and Antonio [name] were bedside, and has elected to make the patient DNR.    Of note, the patient was admitted here 07/27/19 to 08/20/19 for acute hypoxic and hypercapnic respiratory failure related to COPD and heart failure exacerbation for which he required intubation for nearly 2 weeks.    According the the sons, the patient had not seen a PCP since his discharge from the hospital in Feb 2021, and has not been on any medications.  At baseline, he is fully functional, drives, does grocery shopping, etc, but has exercise limitations.  He lives alone but rents a room.  Per the sons, he sometimes drinks several cans of beer in day but is not a daily drinker. One of his sons says he spoke to him earlier in the morning, and he said he was feeling great.   Past Medical History:  COPD ETOH abuse Smoker  Significant Hospital Events:  12/30: on WUA, no purposeful movement and does not track, very asynchronous with vent and hypoxic; CT Head obtained showing:1. Patchy areas of hypoattenuation in the right ACA and PCA territories compatible with acute/subacute infarcts. Local mass effect present with some effacement of the sulci but no midline Shift. 2. No acute hemorrhage. 3. Remote left temporal lobe infarct. 4. Atherosclerosis.   Consults:  PCCM Cardiology Neurology  Palliative Care  Procedures:  N/A  Significant Diagnostic Tests:  12/30: EEG>> 12/30: CT Head>>1. Patchy areas of hypoattenuation in the right ACA and PCA territories compatible with acute/subacute infarcts. Local mass effect present with some effacement of the sulci but no midline shift. 2. No acute hemorrhage. 3. Remote left temporal lobe infarct. 4. Atherosclerosis.   Micro Data:  12/28: SARS-CoV-2 PCR>>negative 12/28: Influenza PCR>>negative 12/28: Blood culture>> 12/30: Tracheal aspirate>>  Antimicrobials:  Zosyn 12/29>>  Interim History / Subjective:  No acute events reported overnight On 35  mcg Neosynephrine, T. Max 100.4 overnight When sedation weaned, no purposeful movement and does not track, very asynchronous with vent and hypoxic CT Head showing patchy acute/subacute infarcts in the Right ACA and PCA, Neurology consulted EEG pending  Objective   Blood pressure (!) 160/53, pulse 71,  temperature 100.22 F (37.9 C), resp. rate (!) 29, height 5\' 6"  (1.676 m), weight 114.8 kg, SpO2 94 %.    Vent Mode: PRVC FiO2 (%):  [40 %-70 %] 40 % Set Rate:  [24 bmp] 24 bmp Vt Set:  [450 mL] 450 mL PEEP:  [5 cmH20] 5 cmH20 Plateau Pressure:  [18 cmH20-26 cmH20] 26 cmH20   Intake/Output Summary (Last 24 hours) at 07/17/2020 0911 Last data filed at 07/17/2020 [id number] Gross per 24 hour  Intake 2499.68 ml  Output 495 ml  Net 2004.68 ml   Filed Weights   07/16/20 0200  Weight: 114.8 kg    Examination: General: Critically ill appearing male, laying in bed, intubated, in NAD HENT: Atraumatic, normocephalic, neck supple, no JVD, ETT in place Lungs: Coarse breath sounds bilaterally, no wheezing, even, synchronous with vent Cardiovascular: Bradycardia, regular rhythm, s1s2, no M/R/g Abdomen: Obese, soft, nontender, nondistended, no guarding or rebound tenderness, BS +x4 Extremities: No deformities, 1+ edema bilateral LE's Neuro: Sedated, pupils PERRL, cough/gag reflex intact Skin: warm and dry.  No obvious rashes, lesions, or ulcerations  Resolved Hospital Problem list   N/A  Assessment & Plan:   Acute hypoxic respiratory failure Acute exacerbation of diastolic heart failure/ pulmonary edema COPD exacerbation ? Aspiration Pneomonia Noted to have pink frothy secretions after intubation, and CXR showed increased interstitial markings suggestive of pulmonary edema, and bilateral patchy airspace opacities.  Viral panel was negative for COVID and influenza A/B.  hsTrop was intially 162, then 297, BNP of 562 and lactic acid was 5.4.  Per son, no report of recent illness, illness appears quite acute.  Differential includes acute flash pulmonary edema/heart failure, acute COPD exacerbation, aspiration pneumonitis, pulmonary embolism or acute ischemic heart.  Poor window on bedside cardiac ECHO, but lung exam notable for B lines suggestive of interstitial process.  Leukocytosis likely  stress/reactive due  -Full vent support with lung protective strategies (6 cc/kg IBW) -Wean FiO2 and PEEP as tolerated to maintain O2 sats greater than 92% -Follow intermittent chest x-ray and ABG as needed -Diuresis as blood pressure and renal function permits, currently unable to diurese due to hypotension requiring pressors and worsening AKI -Monitor fever curve -Trend WBC's and Procalcitonin -Tracheal aspirate pending -Continue Zosyn for now   PEA arrest Non-sustained Vtach Elevated troponin, demand ischemia versus NSTEMI PEA arrest likely result of acute respiratory failure; no strong evidence of ACS.  Runs of non-sustained Vtach  -Continuous cardiac monitoring -Maintain MAP >65 -Vasopressors as needed to maintain MAP goal -Troponin peaked at 1,455 -Discussed with Dr. Tamala Julian, will d/c Heparin due to new finding of Ischemic CVA's -Aspirin and statin -Amiodarone currently on hold due to Bradycardia -Unable to diurese due to Hypotension and AKI -Echocardiogram/troponin with LVEF 55 to [id number], diastolic function unable to be evaluated, RV systolic function normal   AKI Cr 1.62, baseline 1.49 from 08/2019. 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 Iver Nestle -Obtain EEG -MRI brain once hemodynamically stable -Continue statin and aspirin -Echo previously obtained 07/16/20    Pt is critically ill with multiorgan failure.  Prognosis is guarded.   High risk for cardiac arrest and death.       Best practice (evaluated daily)  Diet: NPO Pain/Anxiety/Delirium protocol (if indicated): Propofol, Fentanyl VAP protocol (if indicated): Yes DVT prophylaxis: SCD's GI prophylaxis: Pepcid Glucose control: N/A Mobility: Bedrest Disposition:ICU  Goals of Care:  Last date of multidisciplinary goals of care discussion:07/17/2020 Family and staff present: No family present, nurse at bedside. Updated pt's son Chisum Habenicht via telephone 07/17/20 Summary of discussion: Discussed concern for anoxic brain injury and CT w/ stroke, along with worsening AKI Follow up goals of care discussion due: 07/18/2020 Code Status: DNR  Labs   CBC: Recent Labs  Lab 07/07/2020 2113 07/16/20 0510 07/17/20 0702  WBC 21.7* 14.3* 15.1*  NEUTROABS 11.1* 12.5*  --   HGB 14.1 14.4 12.4*  HCT 44.8 42.9 38.6*  MCV 102.3* 97.9 100.8*  PLT 323 284 288    Basic Metabolic Panel: Recent Labs  Lab 06/25/2020 2113 07/16/20 0510 07/16/20 1418 07/17/20 0702  NA 139 137 137 136  K 3.9 4.4 4.9 4.0  CL 104 103 104 102  CO2 22 25 24 24   GLUCOSE 312* 143* 120* 98  BUN 15 21 26* 36*  CREATININE 1.62* 1.62* 2.03* 2.40*  CALCIUM 8.0* 8.0* 7.9* 7.5*  MG 2.4 2.0 2.0 2.1  PHOS  --  3.5 5.0* 3.7   GFR: Estimated Creatinine Clearance: 31.7 mL/min (A) (by C-G formula based on SCr of 2.4 mg/dL (H)). Recent Labs  Lab 07/18/2020 2113 06/21/2020 2246 07/16/20 0510 07/16/20 0511 07/16/20 0634 07/16/20 1224 07/17/20 0702  PROCALCITON  --   --   --   --   --  5.62  --   WBC 21.7*  --  14.3*  --   --   --  15.1*  LATICACIDVEN 5.4* 2.1*  --  1.1 1.1  --   --     Liver Function Tests: Recent Labs  Lab 06/20/2020 2113 07/16/20 0510  AST 55* 47*  ALT 35 38  ALKPHOS 75 60  BILITOT 0.8 0.6  PROT 7.0 6.8  ALBUMIN 3.4* 3.4*   No results for input(s): LIPASE, AMYLASE in the last 168 hours. No results for input(s): AMMONIA in the last 168 hours.  ABG    Component  Value Date/Time   PHART 7.34 (L) 07/16/2020 0049   PCO2ART 45 07/16/2020 0049   PO2ART 167 (H) 07/16/2020 0049   HCO3 24.3 07/16/2020 0049   ACIDBASEDEF 1.8 07/16/2020 0049   O2SAT 99.4 07/16/2020 0049     Coagulation Profile: Recent Labs  Lab 07/16/20 0634  INR 1.2    Cardiac Enzymes: No results for input(s): CKTOTAL, CKMB, CKMBINDEX, TROPONINI in the last 168 hours.  HbA1C: Hgb A1c MFr Bld  Date/Time Value Ref Range Status  08/01/2019 04:14 PM 4.9 4.8 - 5.6 % Final    Comment:    (NOTE) Pre diabetes:          5.7%-6.4% Diabetes:              >6.4% Glycemic control for   <7.0% adults with diabetes     CBG: Recent Labs  Lab 07/16/20 1613 07/16/20 1920 07/16/20  2322 07/17/20 0342 07/17/20 0714  GLUCAP 119* 123* 118* 88 94    Review of Systems:   Unable to assess due to critical illness, intubation, and sedation  Past Medical History:  He,  has a past medical history of COPD (chronic obstructive pulmonary disease) ([place]), ETOH abuse, and Smoker.   Surgical History:  No past surgical history on file.   Social History:   reports that he has been smoking cigarettes. He has a 27.50 pack-year smoking history. He has never used smokeless tobacco. He reports that he does not drink alcohol and does not use drugs.   Family History:  His family history is not on file.   Allergies No Known Allergies   Home Medications  Prior to Admission medications   Not on File     Critical care time: 35 minutes     Darel Hong, [facility] Pager: [phone]

## 2020-07-17 NOTE — Progress Notes (Signed)
Pt was transported to Ct from CCU and back while on the vent.

## 2020-07-17 NOTE — Consult Note (Signed)
ANTICOAGULATION CONSULT NOTE  Pharmacy Consult for Heparin Infusion Indication: chest pain/ACS  Patient Measurements: Heparin Dosing Weight: 90.3 kg  Labs: Recent Labs    2020/08/04 2113 August 04, 2020 2252 07/16/20 0510 07/16/20 0634 07/16/20 1224 07/16/20 1418 07/16/20 2037 07/17/20 0702 07/17/20 0957  HGB 14.1  --  14.4  --   --   --   --  12.4*  --   HCT 44.8  --  42.9  --   --   --   --  38.6*  --   PLT 323  --  284  --   --   --   --  288  --   APTT  --   --   --  58*  --   --   --   --   --   LABPROT  --   --   --  14.7  --   --   --   --   --   INR  --   --   --  1.2  --   --   --   --   --   HEPARINUNFRC  --   --   --   --    < >  --  0.66 >3.60* 0.49  CREATININE 1.62*  --  1.62*  --   --  2.03*  --  2.40*  --   TROPONINIHS 162* 297* 1,455* 1,380*  --   --   --   --   --    < > = values in this interval not displayed.    Estimated Creatinine Clearance: 31.7 mL/min (A) (by C-G formula based on SCr of 2.4 mg/dL (H)).   Medical History: Past Medical History:  Diagnosis Date  . COPD (chronic obstructive pulmonary disease) ([facility])   . ETOH abuse   . Smoker     Medications:  No anticoagulation prior to admission per my chart review  Assessment: Patient is a 74 y/o M with medical history as above who presented to the ED 12/28 with acute hypoxic respiratory failure with subsequent PEA arrest with ROSC after 15 minutes, decompensated heart failure. Patient was intubated and remains sedated on mechanical ventilation in the ICU. Pharmacy consulted for heparin infusion for ACS.  Baseline aPTT 58s (drawn after initiation of heparin infusion), INR 1.2. Baseline CBC with H&H, platelets within normal limits.  Heparin course: 12/29 at 0346: heparin drip started at 1600 units/hr 12/29 at 1224: HL = 0.52, therapeutic x 1. Continue heparin infusion at 1600 units/hr 12/29 at 2037 HL = 0.66, therapeutic x 2. Continue heparin infusion at 1600 units/hr 12/30 at 0957 HL = 0.49,  therapeutic x 3. Continue heparin infusion at 1600 units/hr  Goal of Therapy:  Heparin level 0.3-0.7 units/ml Monitor platelets by anticoagulation protocol: Yes   Plan:  --Heparin level is therapeutic x 3. Continue heparin infusion at 1600 units/hr --Re-check HL tomorrow AM --Daily CBC per protocol while on heparin infusion   Tressie Ellis 07/17/2020,11:02 AM

## 2020-07-17 NOTE — Progress Notes (Signed)
Informed pts son Carlas Vandyne who is currently at bedside of abnormal CT Head result findings.  All questions were answered will continue to monitor and assess pt.  Sonda Rumble, AGNP  Pulmonary/Critical Care Pager [phone] (please enter 7 digits) PCCM Consult Pager [phone] (please enter 7 digits)

## 2020-07-17 NOTE — Progress Notes (Signed)
eeg done °

## 2020-07-17 NOTE — Procedures (Signed)
Patient Name: Deja Kaigler  MRN: [id number]  Epilepsy Attending: Charlsie Quest  Referring Physician/Provider: Harlon Ditty, NP Date: 07/17/2020  Duration:   Patient history: 74yo M s/p cardiac arrest. EEG to evaluate for seizure.   Level of alertness:  Comatose  AEDs during EEG study: Propofol  Technical aspects: This EEG study was done with scalp electrodes positioned according to the 10-20 International system of electrode placement. Electrical activity was acquired at a sampling rate of 500Hz  and reviewed with a high frequency filter of 70Hz  and a low frequency filter of 1Hz . EEG data were recorded continuously and digitally stored.   Description: EEG showed continuous generalized predominantly 2-3 Hz delta as well as 5-6Hz  theta slowing. EEG was not reactive to noxious stimuli.  Hyperventilation and photic stimulation were not performed.     ABNORMALITY -Continuous slow, generalized  IMPRESSION: This study is suggestive of severe diffuse encephalopathy, nonspecific etiology but could be related to sedation, anoxic/hypoxic brain injury. No seizures or epileptiform discharges were seen throughout the recording.  Ronnae Kaser 

## 2020-07-17 NOTE — Progress Notes (Signed)
PHARMACIST - PHYSICIAN COMMUNICATION  CONCERNING: IV to Oral Route Change Policy  RECOMMENDATION: This patient is receiving famotidine by the intravenous route.  Based on criteria approved by the Pharmacy and Therapeutics Committee, the intravenous medication(s) is/are being converted to the equivalent oral dose form(s).   DESCRIPTION: These criteria include:  The patient is eating (either orally or via tube) and/or has been taking other orally administered medications for a least 24 hours  The patient has no evidence of active gastrointestinal bleeding or impaired GI absorption (gastrectomy, short bowel, patient on TNA or NPO).  If you have questions about this conversion, please contact the Pharmacy Department  [x]   [phone] )  [facility]  [facility], [facility] 07/17/2020 11:46 AM

## 2020-07-17 NOTE — Consult Note (Signed)
Cardiology Consultation:   Patient ID: Antonio [name]; [id number]; [birth year]   Admit date: 06/22/2020 Date of Consult: 07/17/2020  Primary Care Provider: Patient, No Pcp Per Primary Cardiologist: New to [facility] - consult by [facility] Primary Electrophysiologist:  None   Patient Profile:   Antonio [name] is a 74 y.o. male with a hx of COPD, diastolic dysfunction, HTN, morbid obesity, alcohol and tobacco use, and medical nonadherence who is being seen today for the evaluation of elevated HS-Tn at the request of Mr. Dewaine Conger, NP.  History of Present Illness:   Antonio [name] was previously admitted to the hospital in 07/2019 with acute hypoxic and hypercapnic respiratory failure in the setting of COPD and diastolic CHF exacerbation. Echo at that time showed an EF of 55 to 60%, mild to moderate LVH, Gr1DD, no RWMA, normal RVSF and ventricular cavity size, moderate aortic stenosis, and a right atrial pressure of 8 mmHg. Following discharge, he was lost to medical follow up and was not taking medications.   He was admitted to [facility] on 06/19/2020 with CPR in progress with PEA arrest in the setting of acute hypoxic and hypercarbic respiratory failure.  EMS run sheet indicates they found the patient sitting on the back steps of his house and was in respiratory distress.  He denied any chest pain and was able to follow commands.  Upon arriving to the ED he became unresponsive and CPR was initiated.  ROSC was achieved after approximately 15 minutes of CPR and multiple rounds of epinephrine.  He was intubated.  Note indicates he had several runs of NSVT, these are not available for review.  He was placed on amiodarone infusion.  Initial chest x-ray showed bilateral lower lobe patchy airspace opacities along with pulmonary edema.  EKG showed sinus tachycardia, 105 bpm, possible left atrial enlargement and prior anterior infarct, poor R wave progression along the precordial leads, nonspecific st/t changes.  Lower  extremity ultrasound negative for DVT bilaterally.  Labs notable for negative Covid and influenza, initial high-sensitivity troponin I62 with a delta troponin 297 ultimately peaking at 1455 and currently downtrending, BNP 562 trending to 1000, BUN/SCR 15/1.62 trending to 36/2.4, WBC 21.7 trending to 15.1, procalcitonin 5.62.  In the ED the patient's 2 sons elected to make the patient DNR per prior documentation.  Echo showed an EF of 55 to 60%, unable to evaluate wall motion, unable to evaluate diastolic function, normal RV systolic function and ventricular cavity size, and mild to moderate aortic stenosis with a mean gradient of 16 mmHg and a valve area of 0.98 cm.  He remains intubated and sedated and on a phenylephrine along with amiodarone and heparin drips.    Past Medical History:  Diagnosis Date  . COPD (chronic obstructive pulmonary disease) ([place])   . ETOH abuse   . Smoker     No past surgical history on file.   Home Meds: Prior to Admission medications   Not on File    Inpatient Medications: Scheduled Meds: . aspirin  81 mg Per Tube Daily  . atorvastatin  40 mg Oral Daily  . Chlorhexidine Gluconate Cloth  6 each Topical Daily  . docusate  100 mg Per Tube BID  . feeding supplement (PROSource TF)  90 mL Per Tube TID  . folic acid  2 mg Oral Daily  . multivitamin with minerals  1 tablet Oral Daily  . mupirocin ointment  1 application Nasal BID  . thiamine injection  100 mg Intravenous Daily  Continuous Infusions: . sodium chloride    . amiodarone Stopped (07/17/20 0231)  . famotidine (PEPCID) IV 20 mg (07/16/20 2228)  . feeding supplement (VITAL HIGH PROTEIN) 1,000 mL (07/16/20 1704)  . fentaNYL infusion INTRAVENOUS 25 mcg/hr (07/16/20 1701)  . heparin 1,600 Units/hr (07/17/20 [id number])  . phenylephrine (NEO-SYNEPHRINE) Adult infusion 35 mcg/min (07/17/20 0941)  . piperacillin-tazobactam (ZOSYN)  IV 3.375 g (07/17/20 0741)  . propofol (DIPRIVAN) infusion 15 mcg/kg/min  (07/17/20 0538)   PRN Meds: acetaminophen, fentaNYL, fentaNYL (SUBLIMAZE) injection, fentaNYL (SUBLIMAZE) injection, fentaNYL (SUBLIMAZE) injection, fentaNYL (SUBLIMAZE) injection, polyethylene glycol  Allergies:  No Known Allergies  Social History:   Social History   Socioeconomic History  . Marital status: Divorced    Spouse name: Not on file  . Number of children: Not on file  . Years of education: Not on file  . Highest education level: Not on file  Occupational History  . Not on file  Tobacco Use  . Smoking status: Current Every Day Smoker    Packs/day: 0.50    Years: 55.00    Pack years: 27.50    Types: Cigarettes  . Smokeless tobacco: Never Used  Vaping Use  . Vaping Use: Never used  Substance and Sexual Activity  . Alcohol use: Never  . Drug use: Never  . Sexual activity: Not Currently  Other Topics Concern  . Not on file  Social History Narrative  . Not on file   Social Determinants of Health   Financial Resource Strain: Not on file  Food Insecurity: Not on file  Transportation Needs: Not on file  Physical Activity: Not on file  Stress: Not on file  Social Connections: Not on file  Intimate Partner Violence: Not on file     Family History:   No family history on file.  Unable to obtain given patient is intubated.   ROS:  Review of Systems  Unable to perform ROS: Intubated      Physical Exam/Data:   Vitals:   07/17/20 0530 07/17/20 0600 07/17/20 0630 07/17/20 0800  BP: (!) 127/46 (!) 130/42 (!) 144/51 (!) 160/53  Pulse: (!) 46 (!) 47 (!) 51 71  Resp: (!) 24 (!) 24 (!) 24 (!) 29  Temp: 100.04 F (37.8 C) 99.86 F (37.7 C) 99.86 F (37.7 C) 100.22 F (37.9 C)  TempSrc:      SpO2: 100% 100% 100% 94%  Weight:      Height:        Intake/Output Summary (Last 24 hours) at 07/17/2020 1013 Last data filed at 07/17/2020 [id number] Gross per 24 hour  Intake 2499.68 ml  Output 395 ml  Net 2104.68 ml   Filed Weights   07/16/20 0200  Weight:  114.8 kg   Body mass index is 40.85 kg/m.   Physical Exam: General: Ill appearing, intubated and sedated. Head: Normocephalic, atraumatic, sclera non-icteric, no xanthomas, nares without discharge.  Neck: Negative for carotid bruits. JVD not elevated. Lungs: Coarse, vented breath sounds. Heart: RRR with S1 S2. No murmurs, rubs, or gallops appreciated. Abdomen: Soft, non-tender, non-distended with normoactive bowel sounds. No hepatomegaly. No rebound/guarding. No obvious abdominal masses. Msk:  Strength and tone appear normal for age. Extremities: No clubbing or cyanosis. No edema.  Neuro: Intubated and sedated. Psych:  Intubated and sedated.   EKG:  The EKG was personally reviewed and demonstrates: sinus tachycardia, 105 bpm, possible left atrial enlargement and prior anterior infarct, poor R wave progression along the precordial leads, nonspecific st/t changes Telemetry:  Telemetry  was personally reviewed and demonstrates: Junctional with rates in the 60s to 70s bpm, currently in sinus rhytm  Weights: Filed Weights   07/16/20 0200  Weight: 114.8 kg    Relevant CV Studies:  2D echo 07/16/2020: 1. Left ventricular ejection fraction, by estimation, is 55 to 60%. The  left ventricle has normal function. Left ventricular endocardial border  not optimally defined to evaluate regional wall motion. Left ventricular  diastolic function could not be  evaluated.  2. Right ventricular systolic function is normal. The right ventricular  size is normal.  3. The mitral valve is grossly normal. No evidence of mitral valve  regurgitation.  4. The aortic valve was not well visualized. Aortic valve regurgitation  is mild. Mild to moderate aortic valve stenosis. __________  07/29/2019: 1. Left ventricular ejection fraction, by visual estimation, is 55 to  60%. The left ventricle has normal function. There is mild to moderately  increased left ventricular hypertrophy.  2. Left  ventricular diastolic parameters are consistent with Grade I  diastolic dysfunction (impaired relaxation).  3. Left ventricle with no regional wall motion abnormalities.  4. Global right ventricle has normal systolic function.The right  ventricular size is normal. No increase in right ventricular wall  thickness.  5. Left atrial size was normal.  6. The aortic valve is calcified, not well visualized. Moderate aortic  valve stenosis.  7. The inferior vena cava is normal in size with <50% respiratory  variability, suggesting right atrial pressure of 8 mmHg.  8. TR signal is inadequate for assessing pulmonary artery systolic  pressure.  9. Challenging images, definity used.    Laboratory Data:  Chemistry Recent Labs  Lab 07/16/20 0510 07/16/20 1418 07/17/20 0702  NA 137 137 136  K 4.4 4.9 4.0  CL 103 104 102  CO2 25 24 24   GLUCOSE 143* 120* 98  BUN 21 26* 36*  CREATININE 1.62* 2.03* 2.40*  CALCIUM 8.0* 7.9* 7.5*  GFRNONAA 44* 34* 27*  ANIONGAP 9 9 10     Recent Labs  Lab 07/18/2020 2113 07/16/20 0510  PROT 7.0 6.8  ALBUMIN 3.4* 3.4*  AST 55* 47*  ALT 35 38  ALKPHOS 75 60  BILITOT 0.8 0.6   Hematology Recent Labs  Lab 06/27/2020 2113 07/16/20 0510 07/17/20 0702  WBC 21.7* 14.3* 15.1*  RBC 4.38 4.38 3.83*  HGB 14.1 14.4 12.4*  HCT 44.8 42.9 38.6*  MCV 102.3* 97.9 100.8*  MCH 32.2 32.9 32.4  MCHC 31.5 33.6 32.1  RDW 13.7 14.1 14.5  PLT 323 284 288   Cardiac EnzymesNo results for input(s): TROPONINI in the last 168 hours. No results for input(s): TROPIPOC in the last 168 hours.  BNP Recent Labs  Lab 07/11/2020 2113 07/16/20 0510  BNP 562.8* 1,001.1*    DDimer No results for input(s): DDIMER in the last 168 hours.  Radiology/Studies:  US Venous Img Lower Bilateral (DVT)  Result Date: 07/16/2020 IMPRESSION: Sonographic survey of the bilateral lower extremities negative for DVT Electronically Signed   By: Corrie Mckusick D.O.   On: 07/16/2020 11:54    DG Chest Port 1 View  Result Date: 07/16/2020 IMPRESSION: Small bilateral pleural effusions, right greater than left. Associated right lower lobe atelectasis. Endotracheal tube terminates 15 mm above the carina. Enteric tube courses into the mid stomach. Electronically Signed   By: Julian Hy M.D.   On: 07/16/2020 11:55   DG Chest Portable 1 View  Result Date: 07/16/2020 IMPRESSION: Tip of the OG tube within the  proximal stomach. Electronically Signed   By: Jonna Clark M.D.   On: 07/16/2020 00:47   DG Chest Portable 1 View  Result Date: 2020-07-17 IMPRESSION: 1. Endotracheal tube with tip 7 cm above the carina. 2. Enteric tube with tip overlying the expected region of the gastroesophageal junction and side port the expected region of distal esophagus. Recommend advancement by 10 cm. 3. Pulmonary edema. 4. Bilateral lower lobe patchy airspace opacities could represent infection/inflammation. Electronically Signed   By: Tish Frederickson M.D.   On: 07/17/20 21:08  Assessment and Plan:   1. Elevated troponin/NSVT: -Mildly elevated and peaking at 1455, currently down trending -Likely supply demand ischemia in the setting of hypotension associated respiratory driven PEA arrest, COPD exacerbation, HFpEF, and ARF -Echo with preserved LVSF -Heparin gtt for 48-72 hours -Would benefit from cardiac cath, if patient and family would want invasive workup, once he is improved from his acute illness, patient current a DNR per H&P, of note, looking at the discharge summary from 07/2019, he was a DNR at that time as well, timing of the change in his code status is uncertain  -Continue amiodarone infusion -No plans for emergent LHC  2. PEA arrest with acute hypoxic and hypercarbic respiratory failure: -Suspected to be multifactorial in etiology including with COPD exacerbation, morbid obesity, possible OSA/OHS and HFpEF with medical noncompliance with H&P indicating the patient has not been on  medications since 08/2019 -Vent management per PCCM -Hesitant to aggressively IV diurese with ARF  3. ARF: -Suspected to be ATN in the setting of hypotension associated with PEA arrest  -Per PPCM, they are consulting nephrology   4. HFpEF: -Would not aggressively diurese at this time given ARF -Will likely need gentle diuresis moving forward -Nephrology consulted as above per PCCM as he is largely oliguric      For questions or updates, please contact [facility] Please consult [email or web address] for contact info under Cardiology/STEMI.   Signed, Eula Listen, PA-C [facility] Pager: [phone] 07/17/2020, 10:13 AM

## 2020-07-18 ENCOUNTER — Inpatient Hospital Stay: Payer: Medicare Other

## 2020-07-18 DIAGNOSIS — R0902 Hypoxemia: Secondary | ICD-10-CM

## 2020-07-18 DIAGNOSIS — I639 Cerebral infarction, unspecified: Secondary | ICD-10-CM

## 2020-07-18 LAB — CBC
HCT: 41.5 % (ref 39.0–52.0)
Hemoglobin: 13.6 g/dL (ref 13.0–17.0)
MCH: 32.5 pg (ref 26.0–34.0)
MCHC: 32.8 g/dL (ref 30.0–36.0)
MCV: 99 fL (ref 80.0–100.0)
Platelets: 232 10*3/uL (ref 150–400)
RBC: 4.19 MIL/uL — ABNORMAL LOW (ref 4.22–5.81)
RDW: 14.6 % (ref 11.5–15.5)
WBC: 12.4 10*3/uL — ABNORMAL HIGH (ref 4.0–10.5)
nRBC: 0 % (ref 0.0–0.2)

## 2020-07-18 LAB — GLUCOSE, CAPILLARY
Glucose-Capillary: 104 mg/dL — ABNORMAL HIGH (ref 70–99)
Glucose-Capillary: 119 mg/dL — ABNORMAL HIGH (ref 70–99)
Glucose-Capillary: 122 mg/dL — ABNORMAL HIGH (ref 70–99)
Glucose-Capillary: 169 mg/dL — ABNORMAL HIGH (ref 70–99)
Glucose-Capillary: 94 mg/dL (ref 70–99)
Glucose-Capillary: 109 mg/dL — ABNORMAL HIGH (ref 70–99)

## 2020-07-18 LAB — BASIC METABOLIC PANEL
Anion gap: 10 (ref 5–15)
BUN: 45 mg/dL — ABNORMAL HIGH (ref 8–23)
CO2: 24 mmol/L (ref 22–32)
Calcium: 8.1 mg/dL — ABNORMAL LOW (ref 8.9–10.3)
Chloride: 103 mmol/L (ref 98–111)
Creatinine, Ser: 1.69 mg/dL — ABNORMAL HIGH (ref 0.61–1.24)
GFR, Estimated: 42 mL/min — ABNORMAL LOW (ref >60)
Glucose, Bld: 107 mg/dL — ABNORMAL HIGH (ref 70–99)
Potassium: 3.7 mmol/L (ref 3.5–5.1)
Sodium: 137 mmol/L (ref 135–145)

## 2020-07-18 LAB — URINE CULTURE: Culture: NO GROWTH

## 2020-07-18 LAB — TRIGLYCERIDES: Triglycerides: 144 mg/dL (ref <150)

## 2020-07-18 LAB — LEGIONELLA PNEUMOPHILA SEROGP 1 UR AG: L. pneumophila Serogp 1 Ur Ag: NEGATIVE

## 2020-07-18 LAB — PROCALCITONIN: Procalcitonin: 1.79 ng/mL

## 2020-07-18 LAB — MAGNESIUM: Magnesium: 2.4 mg/dL (ref 1.7–2.4)

## 2020-07-18 LAB — PHOSPHORUS: Phosphorus: 3 mg/dL (ref 2.5–4.6)

## 2020-07-18 IMAGING — DX DG CHEST 1V PORT
1 series · 1 of 1 positions shown · non-contrast
Comparison: None.

CLINICAL DATA: Acute respiratory failure, hypoxia

EXAM:
PORTABLE CHEST 1 VIEW

[chest ap]
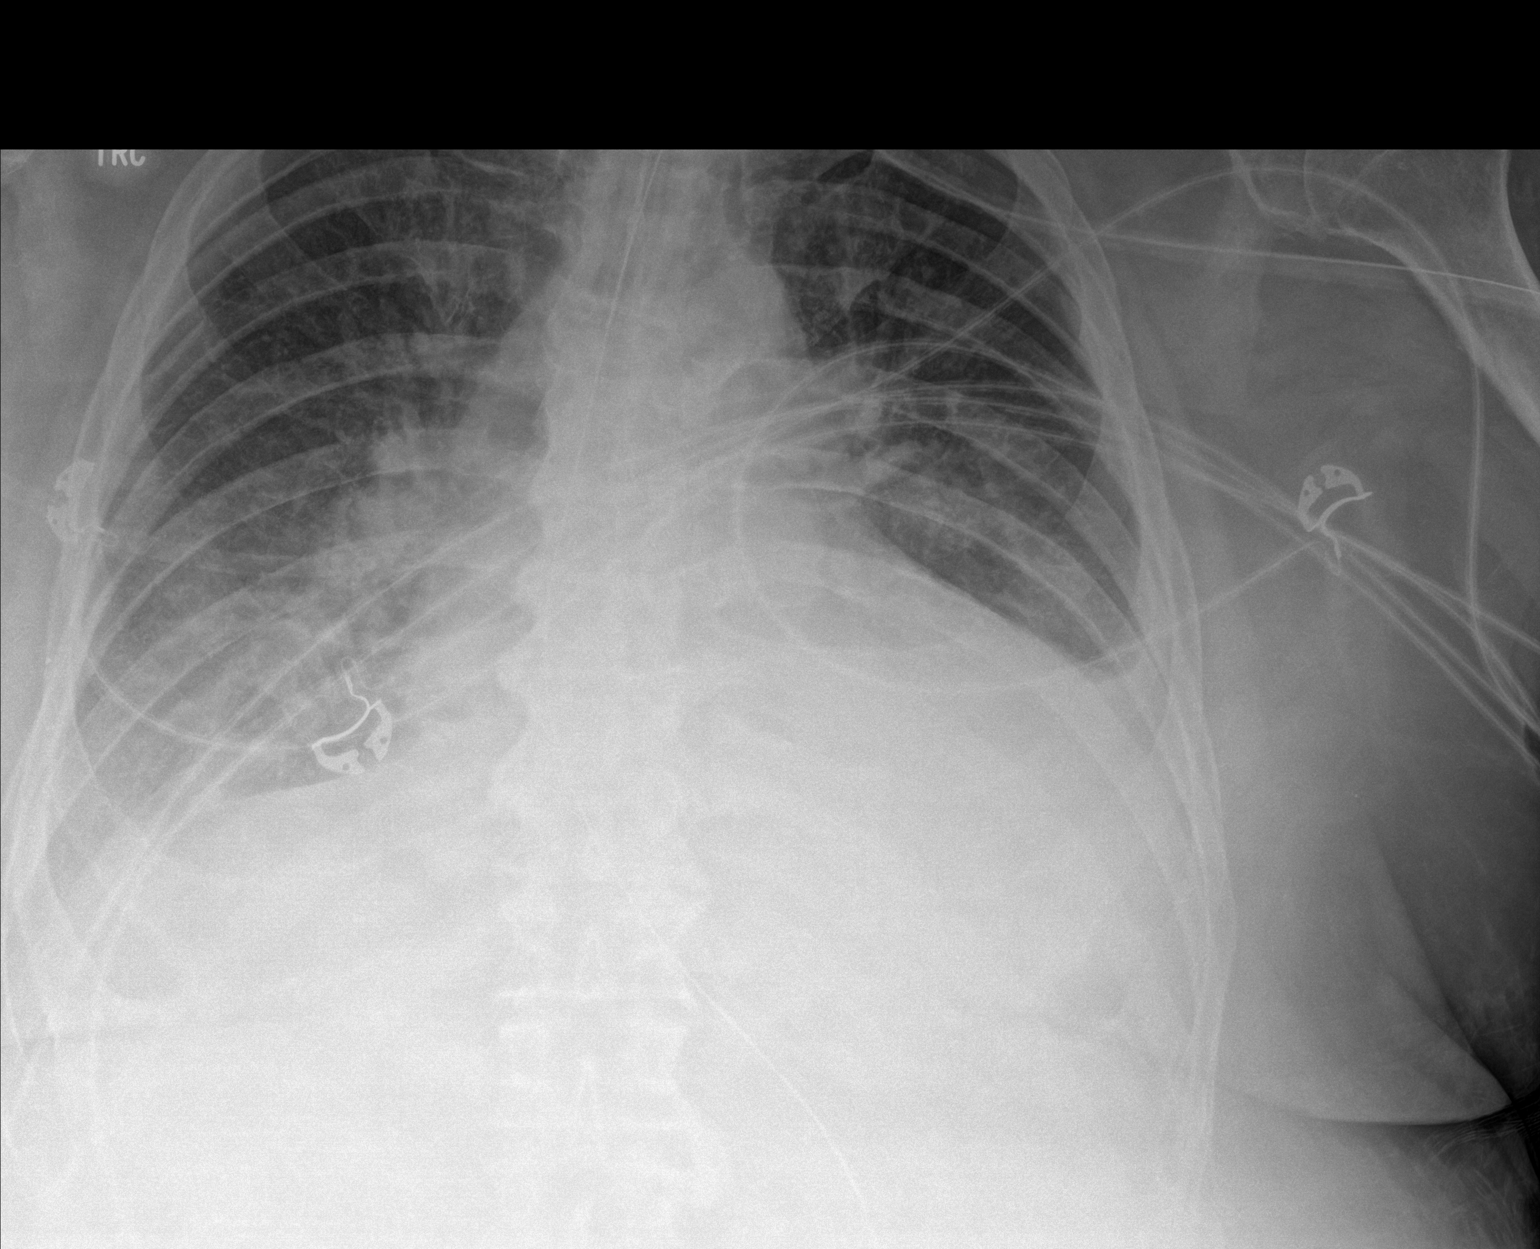

[1 of 1 positions shown; findings below may reference images not displayed]

FINDINGS: Endotracheal tube and nasogastric tube are unchanged. Moderate left
pleural effusion again identified with associated retrocardiac
opacification. Small right pleural effusion suspected with
opacification of the right lung base. Mild perihilar interstitial
pulmonary edema is present, possibly cardiogenic in nature. Mild
cardiomegaly is stable. No pneumothorax.
IMPRESSION: Mild perihilar pulmonary edema and bilateral pleural effusions,
right greater than left in keeping with mild cardiogenic failure,
unchanged.

Stable support tubes.

## 2020-07-18 IMAGING — MR MR HEAD W/O CM
11 of 12 series · 36 of 48 positions shown · non-contrast
Comparison: CT head [DATE]

CLINICAL DATA: Stroke follow-up

EXAM:
MRI HEAD WITHOUT CONTRAST
MRA HEAD WITHOUT CONTRAST
TECHNIQUE: Multiplanar, multiecho pulse sequences of the brain and surrounding
structures were obtained without intravenous contrast. Angiographic
images of the head were obtained using MRA technique without
contrast.

[Series 5: ax dwi_tracew · axial · 3.0mm · 0.60mm/px · z∈[-58,+76]mm · 4 of 48 slices shown]
[im 1/48]
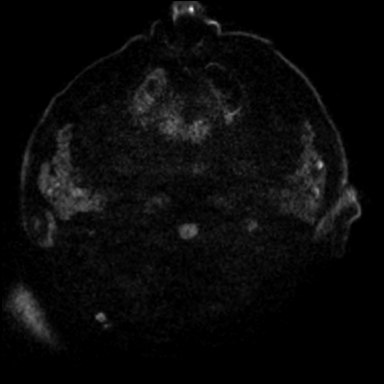
[im 16/48]
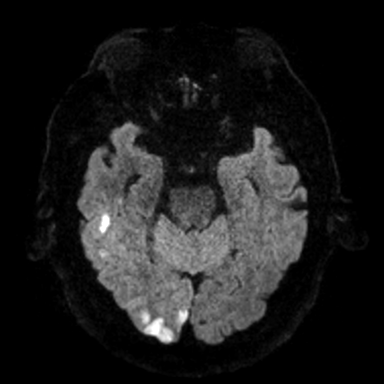
[im 32/48]
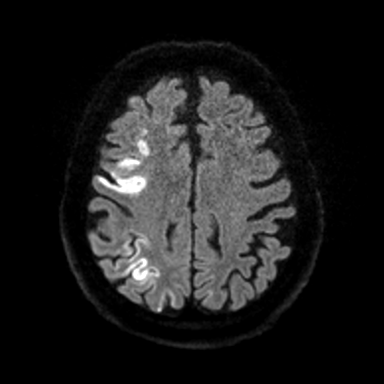
[im 48/48]
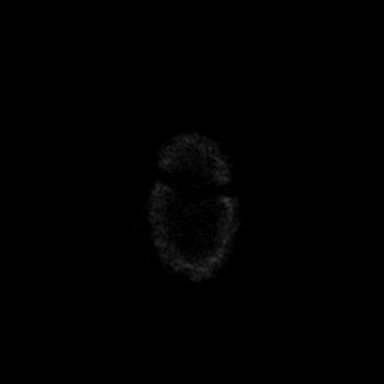

[Series 6: ax dwi_adc · axial · 3.0mm · 0.60mm/px · z∈[-58,+76]mm · 3 of 48 slices shown]
[im 1/48]
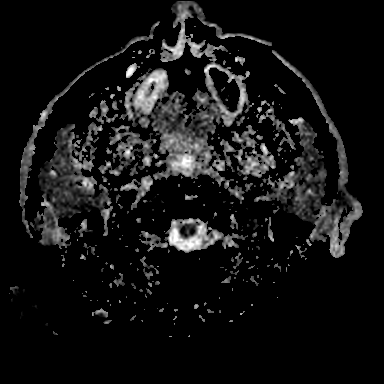
[im 24/48]
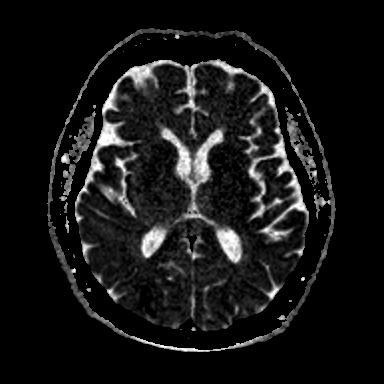
[im 48/48]
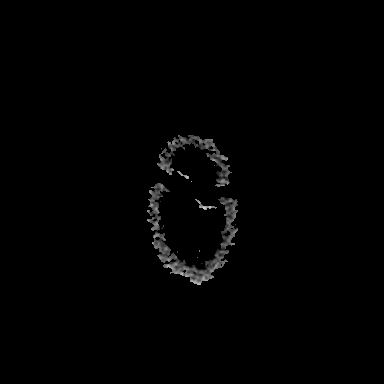

[Series 7: cor dwi_tracew · coronal · 5.0mm · 0.60mm/px · 2 of 40 slices shown]
[im 1/40]
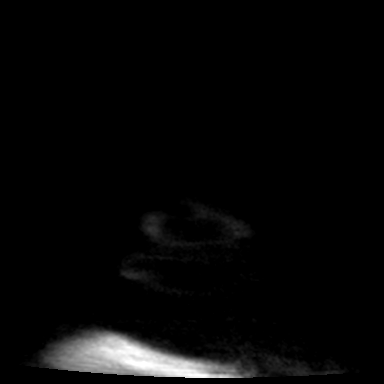
[im 40/40]
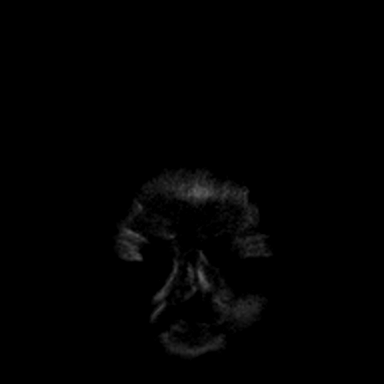

[Series 8: cor dwi_adc · coronal · 5.0mm · 0.60mm/px · 2 of 40 slices shown]
[im 1/40]
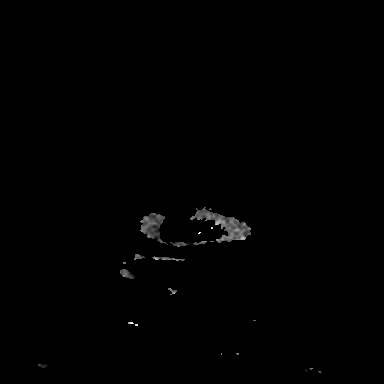
[im 40/40]
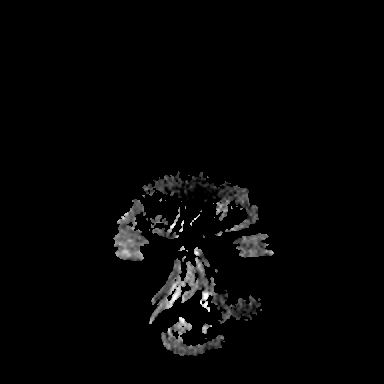

[Series 14: T1 · sagittal · 5.0mm · 0.62mm/px · 1 of 25 slices shown (1 of 2)]
[im 1/25]
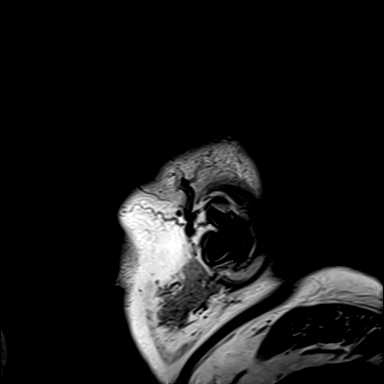

[Series 15: T2 · axial · 5.0mm · 0.53mm/px · z∈[-60,+75]mm · 2 of 27 slices shown (1 of 2)]
[im 1/27]
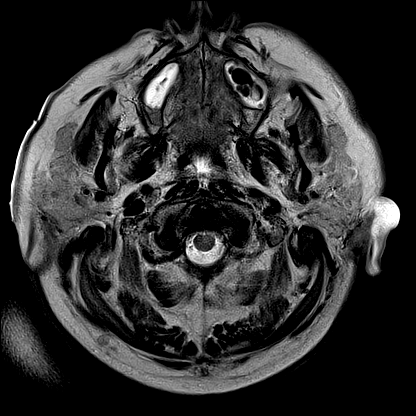
[im 27/27]
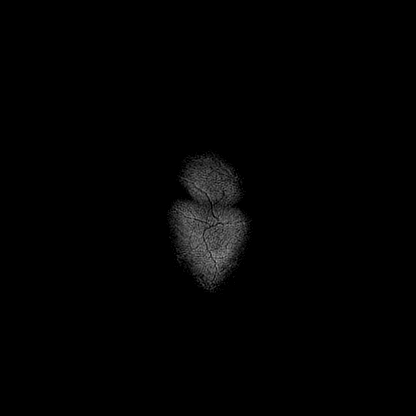

[Series 17: pha_images · axial · 3.0mm · 0.90mm/px · z∈[-61,+85]mm · 3 of 57 slices shown]
[im 1/57]
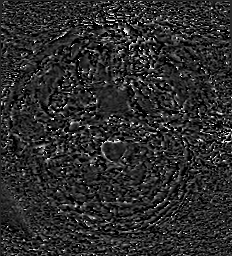
[im 29/57]
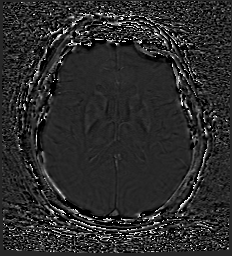
[im 57/57]
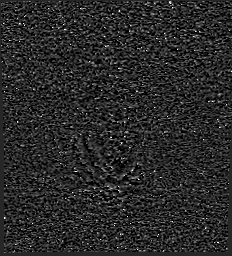

[Series 18: swi_images · axial · 3.0mm · 0.90mm/px · z∈[-66,+87]mm · 4 of 60 slices shown]
[im 1/60]
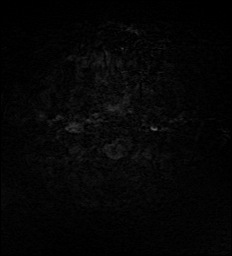
[im 20/60]
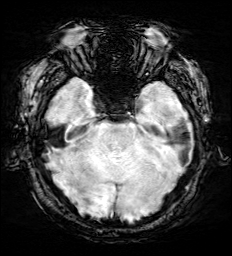
[im 40/60]
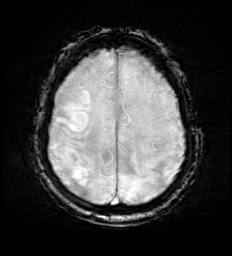
[im 60/60]
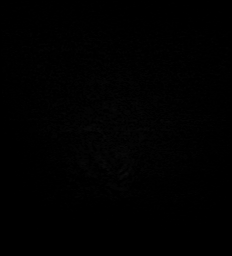

[Series 20: FLAIR · axial · 3.0mm · 0.53mm/px · z∈[-63,+77]mm · 3 of 55 slices shown]
[im 1/55]
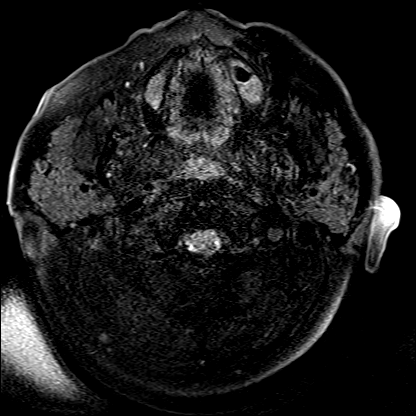
[im 28/55]
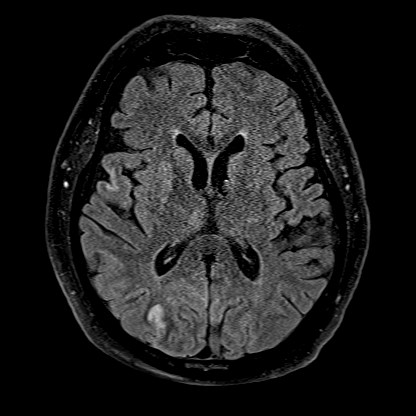
[im 55/55]
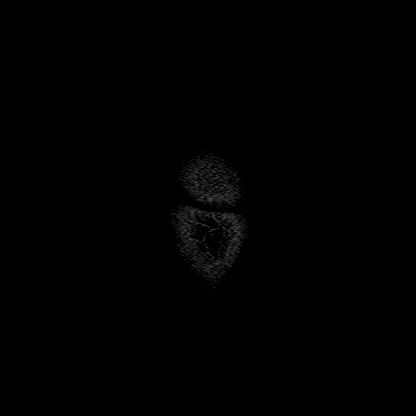

[Series 21: T1 · axial · 1.0mm · 0.98mm/px · z∈[-59,+93]mm · 10 of 176 slices shown (2 of 2)]
[im 1/176]
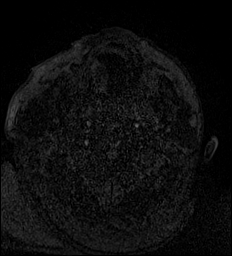
[im 20/176]
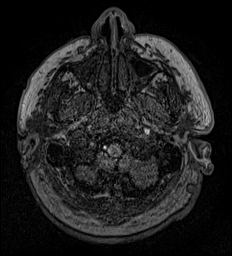
[im 39/176]
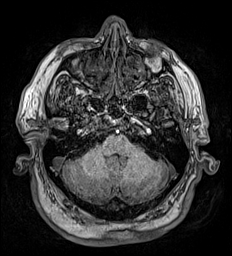
[im 59/176]
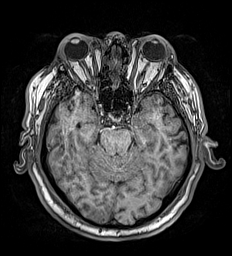
[im 78/176]
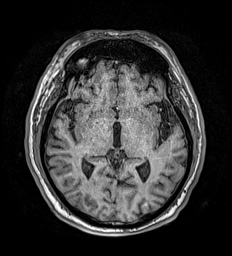
[im 98/176]
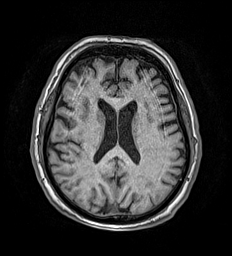
[im 117/176]
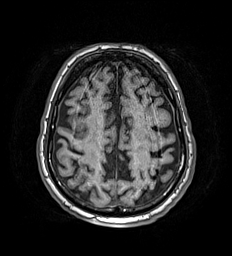
[im 137/176]
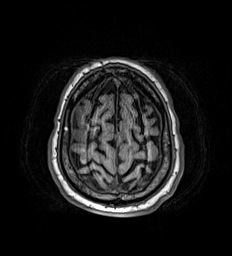
[im 156/176]
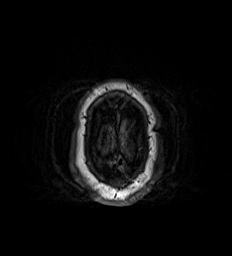
[im 176/176]
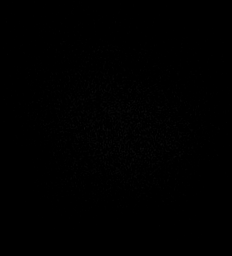

[Series 22: T2 · coronal · 5.0mm · 0.57mm/px · 2 of 31 slices shown (2 of 2)]
[im 1/31]
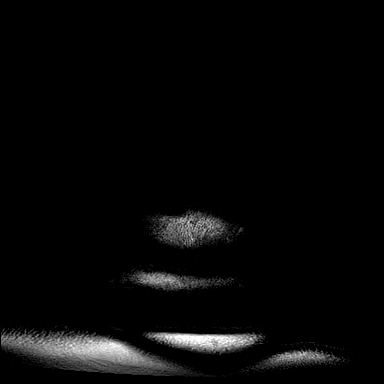
[im 31/31]
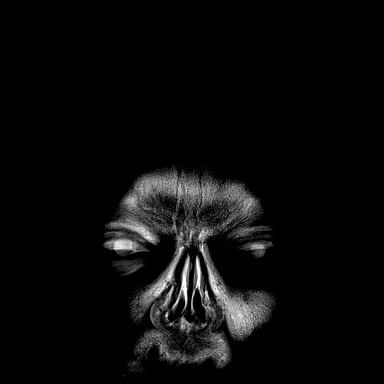

[36 of 48 positions shown; findings below may reference images not displayed]

FINDINGS: MRI HEAD FINDINGS

Brain: Acute infarct right occipital pole. Small acute infarct right
lateral temporal lobe. Scattered cortical infarcts involving the
right frontal parietal lobe over the convexity in the right MCA
territory. Small amount of associated hemorrhage in the right
frontal convexity infarct and in the right anterior frontal infarct.

Small acute infarct left frontal cortex over the convexity. Small
acute infarct left head of caudate.

Ventricle size normal. Chronic infarct left lateral temporal lobe.
Negative for mass lesion.

Vascular: Normal arterial flow voids

Skull and upper cervical spine: No focal skeletal lesion.

Sinuses/Orbits: Extensive mucosal edema paranasal sinuses. Negative
orbit

Other: None

MRA HEAD FINDINGS

Right vertebral artery dominant. Both vertebral arteries patent to
the basilar without stenosis. PICA patent bilaterally. Basilar
patent. Superior cerebellar and posterior cerebral arteries patent
bilaterally. Fetal origin right posterior cerebral artery.

Irregularity and moderate to severe stenosis in the cavernous
carotid bilaterally. No occlusion. Anterior and middle cerebral
arteries patent bilaterally. Mild stenosis left A1 and left M1
segments. Negative for large vessel occlusion. Negative for
aneurysm.
IMPRESSION: 1. Multiple areas of acute cortical infarct in the right MCA
territory involving right frontal parietal cortex with mild
associated hemorrhage. Acute infarct in the right occipital lobe in
the right PCA territory. There is fetal origin of the right
posterior cerebral artery which could account for this infarct.
Possible emboli. In addition, small areas of acute infarct in the
left frontal cortex over the convexity and head of caudate on the
left.
2. Small chronic infarct left lateral temporal lobe.
3. Atherosclerotic irregularity with severe stenosis in the
cavernous carotid bilaterally. No intracranial large vessel
occlusion.

## 2020-07-18 IMAGING — MR MR MRA NECK WO/W CM
2 of 3 series · 26 of 48 positions shown · IV contrast (gadavist)
Comparison: None.

CLINICAL DATA: Stroke

EXAM:
MRA NECK WITHOUT AND WITH CONTRAST
TECHNIQUE: Multiplanar and multiecho pulse sequences of the neck were obtained
without and with intravenous contrast. Angiographic images of the
neck were obtained using MRA technique without and with intravenous
contrast.
CONTRAST:  10mL GADAVIST GADOBUTROL 1 MMOL/ML IV SOLN

[Series 11: angio_fl3d_cor_pre_ttc=2.0s · coronal · B · 0.9mm · 0.85mm/px · 13 of 96 slices shown]
[im 1/96]
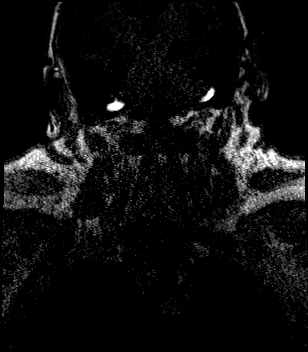
[im 8/96]
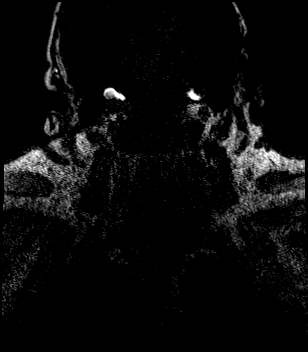
[im 16/96]
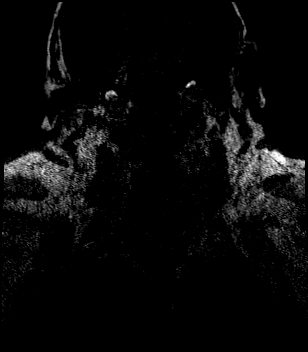
[im 24/96]
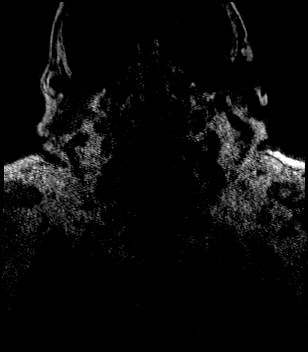
[im 32/96]
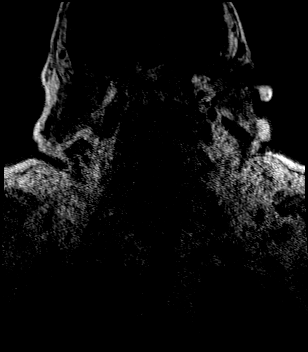
[im 40/96]
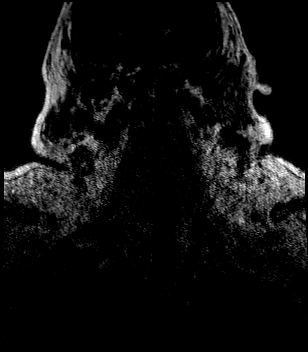
[im 48/96]
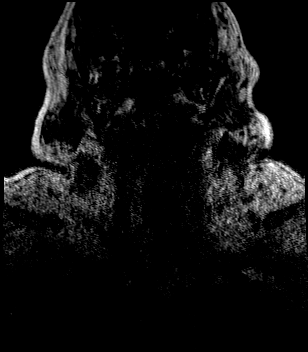
[im 56/96]
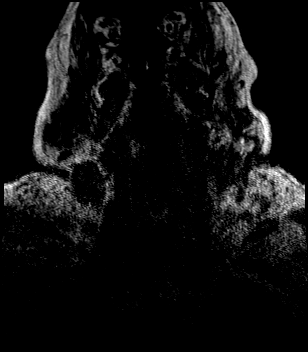
[im 64/96]
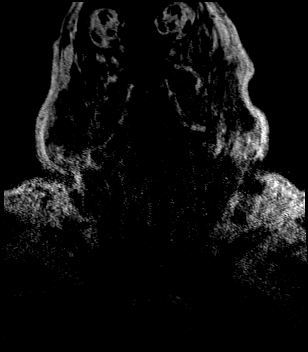
[im 72/96]
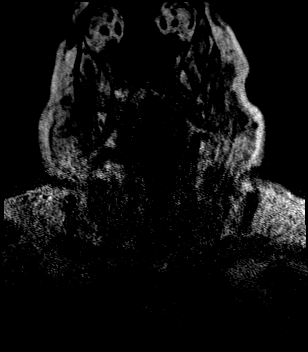
[im 80/96]
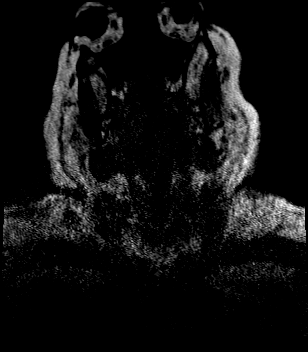
[im 88/96]
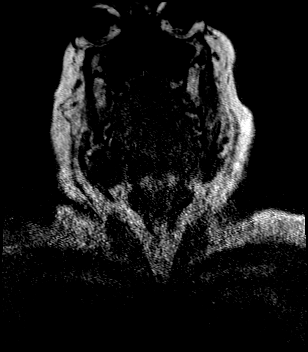
[im 96/96]
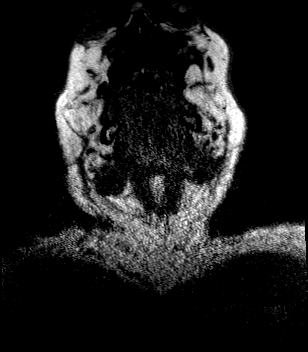

[Series 13: angio_fl3d_cor_post_ttc=2.0s · coronal · B · 0.9mm · 0.85mm/px · 13 of 96 slices shown]
[im 1/96]
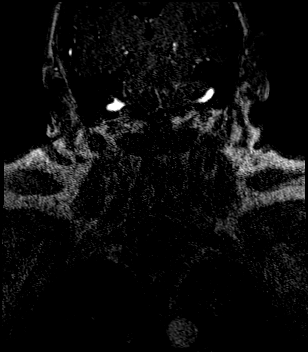
[im 8/96]
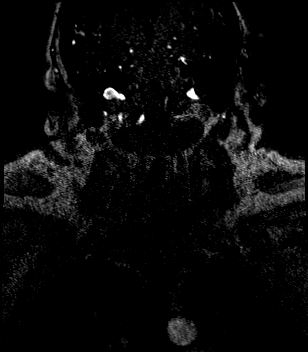
[im 16/96]
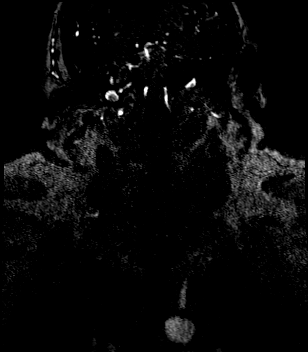
[im 24/96]
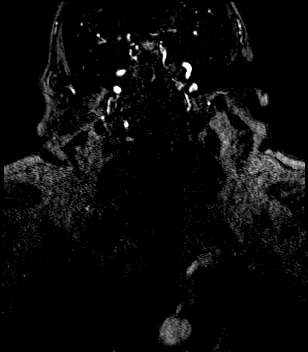
[im 32/96]
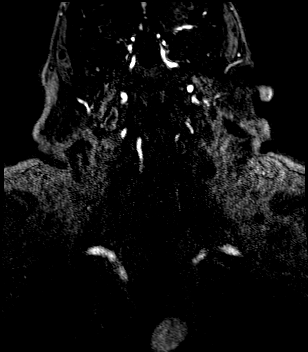
[im 40/96]
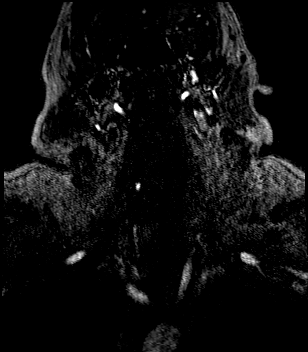
[im 48/96]
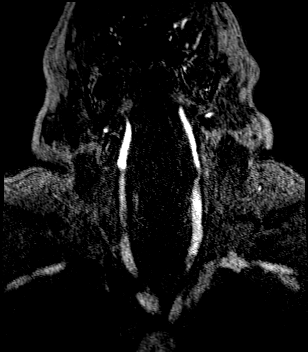
[im 56/96]
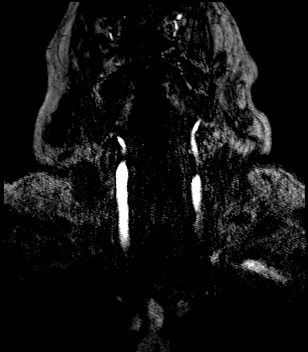
[im 64/96]
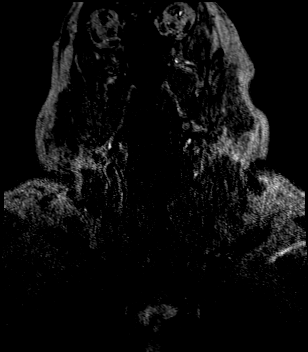
[im 72/96]
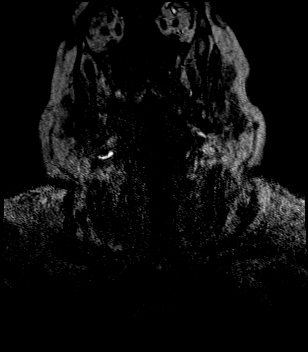
[im 80/96]
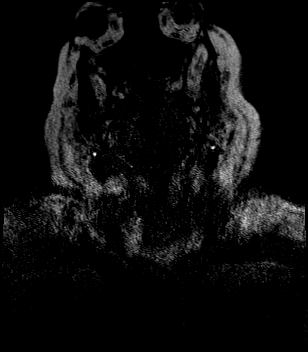
[im 88/96]
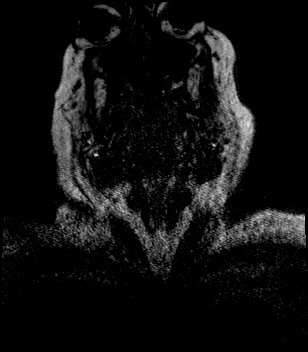
[im 96/96]
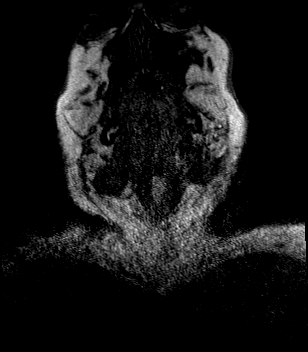

[26 of 48 positions shown; findings below may reference images not displayed]

FINDINGS: Antegrade flow in the carotid and vertebral arteries bilaterally.

Carotid bifurcation patent bilaterally without stenosis. There is
severe stenosis in the cavernous carotid bilaterally due to
atherosclerotic disease

Right vertebral artery dominant and widely patent to the basilar.
Non dominant right vertebral artery is patent to the basilar with
mild stenosis distally. This vessel is best seen on the source
images.
IMPRESSION: Severe atherosclerotic stenosis in the cavernous carotid bilaterally

Carotid bifurcation patent bilaterally. Both vertebral arteries
patent to the basilar.

## 2020-07-18 MED ORDER — ORAL CARE MOUTH RINSE
15.0000 mL | OROMUCOSAL | Status: DC
  Administered 2020-07-19 – 2020-07-20 (×17): 15 mL via OROMUCOSAL

## 2020-07-18 MED ORDER — CHLORHEXIDINE GLUCONATE 0.12% ORAL RINSE (MEDLINE KIT): 15.0000 mL | Freq: Two times a day (BID) | OROMUCOSAL | Status: DC

## 2020-07-18 MED ORDER — METOPROLOL TARTRATE 25 MG PO TABS
12.5000 mg | ORAL_TABLET | Freq: Two times a day (BID) | ORAL | Status: DC
  Administered 2020-07-19: 12.5 mg
  Filled 2020-07-18: qty 1

## 2020-07-18 MED ORDER — GADOBUTROL 1 MMOL/ML IV SOLN
10.0000 mL | Freq: Once | INTRAVENOUS | Status: AC | PRN
  Administered 2020-07-18: 10 mL via INTRAVENOUS

## 2020-07-18 MED ORDER — METOPROLOL TARTRATE 25 MG PO TABS: 12.5000 mg | ORAL_TABLET | Freq: Two times a day (BID) | ORAL | Status: DC

## 2020-07-18 MED ORDER — ENOXAPARIN SODIUM 60 MG/0.6ML ~~LOC~~ SOLN
0.5000 mg/kg | SUBCUTANEOUS | Status: DC
  Administered 2020-07-18: 57.5 mg via SUBCUTANEOUS
  Filled 2020-07-18 (×2): qty 0.6

## 2020-07-18 NOTE — Consult Note (Signed)
Neurology Consultation Reason for Consult: Strokes on HCT  Requesting Physician: Vernard Gambles  CC:   History is obtained from: Chart review given patient's altered mental status  HPI: Antonio [name] is a 74 y.o. male with a past medical history significant for COPD, chronic diastolic heart failure, hypertension, morbid obesity, ongoing tobacco abuse, alcohol abuse, limited access to healthcare.  He began to have respiratory distress on 12/28 for which EMS was activated.  He was placed on BiPAP in route and on arrival to the ED was found to be in PEA arrest, receiving 4 rounds of epinephrine and achieving ROSC after about 15 minutes.  He was intubated, and started on amiodarone drip for nonsustained runs of ventricular tachycardia.  He was last hospitalized in February 2021 with new onset congestive heart failure and COPD leading to acute hypoxic/hypercarbic respiratory failure requiring intubation from 1/8-1/23, discharged on 2/1.  His course was complicated by significant metabolic encephalopathy and he was discharged to rehab.  Per family he has not been taking any of his medications since his discharge nor has he sought medical care.  On his initial admission he was listed as a full code, but per discharge paperwork he was a DNR CODE STATUS.  At baseline, per family he is fully functional, drives and goes to the grocery store but does have limited exercise tolerance.  He rents a room and sometimes binge drinks but is not a daily drinker.  Reportedly prior to his shortness of breath he had spoken to his son that same day and said he was feeling great  Cardiology was consulted and he was initially started on ACS protocol with heparin drip, which was discontinued on discovery of his acute strokes.  He was felt not to be a good candidate for cardiac catheterization given normal ejection fraction, unclear CODE STATUS, poor renal function.  Plan is tentatively for cardiac catheterization next week  after stabilization, improvement of his respiratory status and monitoring of renal function, if the family wishes for aggressive care.  LKW: 12/28 tPA given?: No, due to out of the window  Premorbid modified rankin scale:     1 - No significant disability. Able to carry out all usual activities, despite some symptoms.  ROS: Unable to obtain due to altered mental status.   Past Medical History:  Diagnosis Date   COPD (chronic obstructive pulmonary disease) ([place])    ETOH abuse    Smoker    No past surgical history on file.  No family history on file. Unable to assess secondary to patient's mental status   Social History:  reports that he has been smoking cigarettes. He has a 27.50 pack-year smoking history. He has never used smokeless tobacco. He reports that he does not drink alcohol and does not use drugs.  Exam: Current vital signs: BP (!) 122/36   Pulse 63   Temp 99.14 F (37.3 C)   Resp (!) 24   Ht 5\' 6"  (1.676 m)   Wt 114.8 kg   SpO2 97%   BMI 40.85 kg/m  Vital signs in last 24 hours: Temp:  [98.6 F (37 C)-100.94 F (38.3 C)] 99.14 F (37.3 C) (12/31 0700) Pulse Rate:  [56-93] 63 (12/31 0700) Resp:  [15-27] 24 (12/31 0700) BP: (108-173)/(35-69) 122/36 (12/31 0700) SpO2:  [96 %-100 %] 97 % (12/31 0700) FiO2 (%):  [30 %-100 %] 40 % (12/31 0802)   Physical Exam  Constitutional: Chronically ill, obese  Psych: minimally interactive Eyes: Mild scleral edma HENT:  ETT in place MSK: No obvious joint deformities  Cardiovascular: Normal rate and regular rhythm.  Respiratory: Comfortable on vent except with noxious stim GI: Soft.  No distension.  Skin: Edematous throughout, some small bleeding wounds on right forearm  Neuro: 10:50 AM after sedation paused x 30 min Mental Status: Eyes initially closed, slightly opens eye to voice but does not follow any commands  Cranial Nerves: II: Pupils are equal, round, and reactive to light. 3 -> 2 mm III,IV, VI: VOR better  to the right than the left, cannot clearly get to cross midline. Gaze dysconjugate  V: Facial sensation is symmetric to eyelash brush VII: Facial movement is symmetric.  VIII: hearing is intact to voice based on slight eye opening XII: tongue is midline; protrudes spontaneously with noxious stim Motor: Tone is mildly increased throughout. Slight movement of bilateral UE with noxious stim, too slight to characterize posturing vs. Purposeful. Distal stimuli with brisk triple flexion in the LE however, internally rotates feet with noxious stimulation at the thighs, again difficult to characterize purposeful vs. posturing Sensory: Equal minimal response to noxious stim in all 4 extremities  Deep Tendon Reflexes: Difficult to elicit secondary to edema Plantars: Toes are briskly upgoing bilaterally   NIHSS total 25 Score breakdown:  3 points for level of consciousness 2 points for answering questions 2 points for following commands 1 point for partial gaze palsy 3 points for visual Fields Unable to assess facial droop due to ET tube 3 points for each of the extremity movements x4 extremities 0 points for limb ataxia 0 points for sensory loss 2 points for severe aphasia Unable to assess dysarthria due to ET tube 0 points for extinction   I have reviewed labs in epic and the results pertinent to this consultation are:  Troponin peak at 1455 on 12/29 Leukocytosis to 14.3 on arrival, improving to 12.4 Blood cultures from 12/28 NTD, urine culture from [facility] 12/29 NTD Creatinine peaked at 2.4 on 12/30, now improving to 1.6, baseline of 0.7-1    Lab Results  Component Value Date   CHOL 131 08/17/2019   HDL 21 (L) 08/17/2019   LDLCALC 89 08/17/2019   TRIG 144 07/18/2020   CHOLHDL 6.2 08/17/2019   Lab Results  Component Value Date   HGBA1C 4.9 08/01/2019   I have reviewed the images obtained:  CT head personally reviewed, agree with radiology read of patchy hypoattenuation in the  right ACA/PCA territories without acute hemorrhage as well as a remote left temporal lobe infarct  Echocardiogram 12/29, technically difficult due to body habitus and positioning EF 55 to 60% Mild to moderate aortic valve stenosis Normal biatrial size and right ventricular function  EEG with severe encephalopathy   Impression: 74 y.o. male with PMHx as above, cardioemoblic vs. Watershed infarcts in the setting of arrthymia and PEA arrest, with GoC discussion ongoing. Exam is limited without clear evidence of intact cortical function other than eye opening to voice.   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 Watershed in the setting of PEA arrest and arrhythmia  - Agree with no heparin drip - MAP goal greater than 65, further guidance on blood pressure goal pending vessel imaging - MRI brain today w/ MRA now that the patient is off pressors, may help guide GoC   - Further workup pending goals of care  Brooke Dare MD-PhD [facility] [phone]  40 minutes of critical care for this patient today

## 2020-07-18 NOTE — Plan of Care (Signed)
Palliative Medicine consult noted. Due to high referral volume, there will be a delay seeing this patient. Please call the Palliative Medicine Team office at [phone] if recommendations are needed in the interim.  Thank you for inviting us to see this patient.    

## 2020-07-18 NOTE — Consult Note (Signed)
[facility] CONSULT NOTE    Date: 07/18/2020                  Patient Name:  Antonio [name]  MRN: [id number]  DOB: [birth year]  Age / Sex: 74 y.o., male         PCP: Patient, No Pcp Per                 Service Requesting Consult:  Critical care                 Reason for Consult:  Acute kidney injury            History of Present Illness: Patient is a 74 y.o. male with a PMHx of COPD, chronic diastolic heart failure, hypertension, morbid obesity, alcohol abuse, tobacco abuse, who was admitted to [facility] on August 09, 2020 for evaluation of cardiac arrest.  Patient unable to offer any history at this time therefore history obtained through chart review.  Patient apparently was living with a roommate prior to hospital admission.  Patient's roommate called the patient's son when it was found that the patient was experiencing shortness of breath.  When EMS arrived they found the patient be in significant respiratory distress and he was started on BiPAP.  He subsequently experienced syncope and developed PEA arrest.  CPR was initiated and patient had return of spontaneous circulation after 15 minutes.  He then developed nonsustained V. tach and was subsequent started on amiodarone drip.  Urine output over the preceding 24 hours has been 1.2.  Creatinine trending down and currently 1.69.  Back in January his renal function was found to be normal.   Medications: Outpatient medications: No medications prior to admission.    Current medications: Current Facility-Administered Medications  Medication Dose Route Frequency Provider Last Rate Last Admin  . 0.9 %  sodium chloride infusion  250 mL Intravenous Continuous Karl Ito, MD 5 mL/hr at 07/18/20 0500 Infusion Verify at 07/18/20 0500  . acetaminophen (TYLENOL) tablet 650 mg  650 mg Per Tube Q6H PRN Tressie Ellis, RPH      . aspirin chewable tablet 81 mg  81 mg Per Tube Daily Tressie Ellis, RPH   81 mg at 07/18/20  1062  . atorvastatin (LIPITOR) tablet 40 mg  40 mg Per Tube Daily Tressie Ellis, RPH   40 mg at 07/18/20 6948  . Chlorhexidine Gluconate Cloth 2 % PADS 6 each  6 each Topical Daily Salome Holmes, MD   6 each at 07/16/20 2045  . docusate (COLACE) 50 MG/5ML liquid 100 mg  100 mg Per Tube BID Salome Holmes, MD   100 mg at 07/17/20 2241  . enoxaparin (LOVENOX) injection 57.5 mg  0.5 mg/kg Subcutaneous Q24H Salena Saner, MD      . famotidine (PEPCID) tablet 20 mg  20 mg Per Tube BID Tressie Ellis, RPH   20 mg at 07/18/20 5462  . feeding supplement (PROSource TF) liquid 90 mL  90 mL Per Tube TID Jolyn Nap, MD   90 mL at 07/17/20 2242  . feeding supplement (VITAL HIGH PROTEIN) liquid 1,000 mL  1,000 mL Per Tube Continuous Jolyn Nap, MD 45 mL/hr at 07/18/20 0500 Infusion Verify at 07/18/20 0500  . fentaNYL (SUBLIMAZE) bolus via infusion 25 mcg  25 mcg Intravenous Q15 min PRN Salome Holmes, MD   25 mcg at 07/16/20 1806  . fentaNYL (SUBLIMAZE) injection 25-100 mcg  25-100  mcg Intravenous Q30 min PRN Akingbade, Samuella Cota, MD      . fentaNYL (SUBLIMAZE) injection 50 mcg  50 mcg Intravenous Q2H PRN Blake Divine, MD      . fentaNYL 2531mcg in NS 225mL (53mcg/ml) infusion-PREMIX  25-200 mcg/hr Intravenous Continuous Cheryll Dessert, MD 10 mL/hr at 07/18/20 0552 100 mcg/hr at 07/18/20 0552  . folic acid (FOLVITE) tablet 2 mg  2 mg Oral Daily Cheryll Dessert, MD   2 mg at 07/18/20 [id number]  . gadobutrol (GADAVIST) 1 MMOL/ML injection 10 mL  10 mL Intravenous Once PRN Bhagat, Srishti L, MD      . Derrill Memo ON 07/19/2020] metoprolol tartrate (LOPRESSOR) tablet 12.5 mg  12.5 mg Per Tube BID Benita Gutter, RPH      . multivitamin with minerals tablet 1 tablet  1 tablet Oral Daily Cheryll Dessert, MD   1 tablet at 07/18/20 [id number]  . mupirocin ointment (BACTROBAN) 2 % 1 application  1 application Nasal BID Cheryll Dessert, MD   1 application at 0000000 0906  .  piperacillin-tazobactam (ZOSYN) IVPB 3.375 g  3.375 g Intravenous Q8H Lockie Mola B, RPH 12.5 mL/hr at 07/18/20 0556 3.375 g at 07/18/20 0556  . polyethylene glycol (MIRALAX / GLYCOLAX) packet 17 g  17 g Oral Daily PRN Akingbade, Samuella Cota, MD      . propofol (DIPRIVAN) 1000 MG/100ML infusion  0-50 mcg/kg/min Intravenous Continuous Renda Rolls, RPH 20.7 mL/hr at 07/18/20 0952 30 mcg/kg/min at 07/18/20 0952  . thiamine (B-1) injection 100 mg  100 mg Intravenous Daily Cheryll Dessert, MD   100 mg at 07/18/20 [id number]      Allergies: No Known Allergies    Past Medical History: Past Medical History:  Diagnosis Date  . COPD (chronic obstructive pulmonary disease) ([place])   . ETOH abuse   . Smoker      Past Surgical History: No past surgical history on file.   Family History: No family history on file.   Social History: Social History   Socioeconomic History  . Marital status: Divorced    Spouse name: Not on file  . Number of children: Not on file  . Years of education: Not on file  . Highest education level: Not on file  Occupational History  . Not on file  Tobacco Use  . Smoking status: Current Every Day Smoker    Packs/day: 0.50    Years: 55.00    Pack years: 27.50    Types: Cigarettes  . Smokeless tobacco: Never Used  Vaping Use  . Vaping Use: Never used  Substance and Sexual Activity  . Alcohol use: Never  . Drug use: Never  . Sexual activity: Not Currently  Other Topics Concern  . Not on file  Social History Narrative  . Not on file   Social Determinants of Health   Financial Resource Strain: Not on file  Food Insecurity: Not on file  Transportation Needs: Not on file  Physical Activity: Not on file  Stress: Not on file  Social Connections: Not on file  Intimate Partner Violence: Not on file     Review of Systems: As per HPI  Vital Signs: Blood pressure (!) 122/36, pulse 63, temperature 99.14 F (37.3 C), resp. rate (!) 24, height 5\' 6"  (1.676  m), weight 114.8 kg, SpO2 97 %.  Weight trends: Filed Weights   07/16/20 0200  Weight: 114.8 kg    Physical Exam: Physical Exam: General:  Critically ill-appearing  Head:  Endotracheal tube in place  Eyes:  Anicteric  Neck:  Supple  Lungs:   Scattered rhonchi, vent assisted  Heart:  S1S2 irregular at times  Abdomen:   Soft, nontender, bowel sounds present  Extremities:  Trace peripheral edema.  Neurologic:  Intubated, sedated  Skin:  No lesions  Access:  No hemodialysis access    Lab results: Basic Metabolic Panel: Recent Labs  Lab 07/16/20 0510 07/16/20 1418 07/17/20 0702 07/17/20 1721 07/18/20 0510  NA 137 137 136  --  137  K 4.4 4.9 4.0  --  3.7  CL 103 104 102  --  103  CO2 25 24 24   --  24  GLUCOSE 143* 120* 98  --  107*  BUN 21 26* 36*  --  45*  CREATININE 1.62* 2.03* 2.40*  --  1.69*  CALCIUM 8.0* 7.9* 7.5*  --  8.1*  MG 2.0 2.0 2.1 2.2 2.4  PHOS 3.5 5.0* 3.7 3.5 3.0    Liver Function Tests: Recent Labs  Lab 07/10/2020 2113 07/16/20 0510  AST 55* 47*  ALT 35 38  ALKPHOS 75 60  BILITOT 0.8 0.6  PROT 7.0 6.8  ALBUMIN 3.4* 3.4*   No results for input(s): LIPASE, AMYLASE in the last 168 hours. No results for input(s): AMMONIA in the last 168 hours.  CBC: Recent Labs  Lab 06/19/2020 2113 07/16/20 0510 07/17/20 0702 07/18/20 0510  WBC 21.7* 14.3* 15.1* 12.4*  NEUTROABS 11.1* 12.5*  --   --   HGB 14.1 14.4 12.4* 13.6  HCT 44.8 42.9 38.6* 41.5  MCV 102.3* 97.9 100.8* 99.0  PLT 323 284 288 232    Cardiac Enzymes: No results for input(s): CKTOTAL, CKMB, CKMBINDEX, TROPONINI in the last 168 hours.  BNP: Invalid input(s): POCBNP  CBG: Recent Labs  Lab 07/17/20 1934 07/17/20 2320 07/18/20 0322 07/18/20 0743 07/18/20 1204  GLUCAP 81 101* 104* 119* 122*    Microbiology: Results for orders placed or performed during the hospital encounter of 07/04/2020  Resp Panel by RT-PCR (Flu A&B, Covid) Nasopharyngeal Swab     Status: None    Collection Time: 07/11/2020  9:13 PM   Specimen: Nasopharyngeal Swab; Nasopharyngeal(NP) swabs in vial transport medium  Result Value Ref Range Status   SARS Coronavirus 2 by RT PCR NEGATIVE NEGATIVE Final    Comment: (NOTE) SARS-CoV-2 target nucleic acids are NOT DETECTED.  The SARS-CoV-2 RNA is generally detectable in upper respiratory specimens during the acute phase of infection. The lowest concentration of SARS-CoV-2 viral copies this assay can detect is 138 copies/mL. A negative result does not preclude SARS-Cov-2 infection and should not be used as the sole basis for treatment or other patient management decisions. A negative result may occur with  improper specimen collection/handling, submission of specimen other than nasopharyngeal swab, presence of viral mutation(s) within the areas targeted by this assay, and inadequate number of viral copies(<138 copies/mL). A negative result must be combined with clinical observations, patient history, and epidemiological information. The expected result is Negative.  Fact Sheet for Patients:  EntrepreneurPulse.com.au  Fact Sheet for Healthcare Providers:  IncredibleEmployment.be  This test is no t yet approved or cleared by the [place] FDA and  has been authorized for detection and/or diagnosis of SARS-CoV-2 by FDA under an Emergency Use Authorization (EUA). This EUA will remain  in effect (meaning this test can be used) for the duration of the COVID-19 declaration under Section 564(b)(1) of the Act, 21 U.S.C.section 360bbb-3(b)(1), unless the authorization is terminated  or revoked sooner.       Influenza A by PCR NEGATIVE NEGATIVE Final   Influenza B by PCR NEGATIVE NEGATIVE Final    Comment: (NOTE) The Xpert Xpress SARS-CoV-2/FLU/RSV plus assay is intended as an aid in the diagnosis of influenza from Nasopharyngeal swab specimens and should not be used as a sole basis for treatment.  Nasal washings and aspirates are unacceptable for Xpert Xpress SARS-CoV-2/FLU/RSV testing.  Fact Sheet for Patients: EntrepreneurPulse.com.au  Fact Sheet for Healthcare Providers: IncredibleEmployment.be  This test is not yet approved or cleared by the [place] FDA and has been authorized for detection and/or diagnosis of SARS-CoV-2 by FDA under an Emergency Use Authorization (EUA). This EUA will remain in effect (meaning this test can be used) for the duration of the COVID-19 declaration under Section 564(b)(1) of the Act, 21 U.S.C. section 360bbb-3(b)(1), unless the authorization is terminated or revoked.  Performed at [facility], [address] [place], [place] [address]   Culture, blood (routine x 2)     Status: None (Preliminary result)   Collection Time: 06/18/2020  9:18 PM   Specimen: BLOOD  Result Value Ref Range Status   Specimen Description BLOOD LEFT ANTECUBITAL  Final   Special Requests   Final    BOTTLES DRAWN AEROBIC AND ANAEROBIC Blood Culture results may not be optimal due to an inadequate volume of blood received in culture bottles   Culture   Final    NO GROWTH 3 DAYS Performed at [facility], [address] [place], [place] [address]    Report Status PENDING  Incomplete  MRSA PCR Screening     Status: Abnormal   Collection Time: 07/16/20  5:55 AM   Specimen: Nasopharyngeal  Result Value Ref Range Status   MRSA by PCR POSITIVE (A) NEGATIVE Final    Comment:        The GeneXpert MRSA Assay (FDA approved for NASAL specimens only), is one component of a comprehensive MRSA colonization surveillance program. It is not intended to diagnose MRSA infection nor to guide or monitor treatment for MRSA infections. RESULT CALLED TO, READ BACK BY AND VERIFIED WITHSallye Lat AT [id number] 07/16/20 SDR Performed at [facility], [address] [place], [place] [address]   Urine Culture      Status: None   Collection Time: 07/16/20  5:05 PM   Specimen: Urine, Random  Result Value Ref Range Status   Specimen Description   Final    URINE, RANDOM Performed at [facility], [address], [place], [place] [address]    Special Requests   Final    NONE Performed at [facility], [address], [place], [place] [address]    Culture   Final    NO GROWTH Performed at [facility], [place] [address], [place], [place] [address]    Report Status 07/18/2020 FINAL  Final  Culture, respiratory     Status: None (Preliminary result)   Collection Time: 07/17/20  9:14 AM   Specimen: Tracheal Aspirate; Respiratory  Result Value Ref Range Status   Specimen Description   Final    TRACHEAL ASPIRATE Performed at [facility], [address] [place], [place] [address]    Special Requests   Final    NONE Performed at [facility], [place], [place] [address]    Gram Stain   Final    FEW WBC PRESENT,BOTH PMN AND MONONUCLEAR RARE GRAM POSITIVE COCCI RARE  GRAM VARIABLE ROD    Culture   Final    CULTURE REINCUBATED FOR BETTER GROWTH Performed at [facility], [place] [address], [place], [place] [address]    Report Status PENDING  Incomplete    Coagulation Studies: Recent Labs    07/16/20 0634  LABPROT 14.7  INR 1.2    Urinalysis: Recent Labs    06/24/2020 2113  COLORURINE YELLOW*  LABSPEC 1.014  PHURINE 6.0  GLUCOSEU 150*  HGBUR LARGE*  BILIRUBINUR NEGATIVE  KETONESUR NEGATIVE  PROTEINUR >=300*  NITRITE NEGATIVE  LEUKOCYTESUR NEGATIVE      Imaging: EEG  Result Date: 07/17/2020 Lora Havens, MD     07/17/2020 10:21 PM Patient Name: Narain Waugaman MRN: [id number] Epilepsy Attending: Lora Havens Referring Physician/Provider: Darel Hong, NP Date: 07/17/2020 Duration: Patient history: 74yo M s/p cardiac arrest. EEG to evaluate for seizure. Level of alertness:  Comatose AEDs during EEG study:  Propofol Technical aspects: This EEG study was done with scalp electrodes positioned according to the 10-20 International system of electrode placement. Electrical activity was acquired at a sampling rate of 500Hz  and reviewed with a high frequency filter of 70Hz  and a low frequency filter of 1Hz . EEG data were recorded continuously and digitally stored. Description: EEG showed continuous generalized predominantly 2-3 Hz delta as well as 5-6Hz  theta slowing. EEG was not reactive to noxious stimuli.  Hyperventilation and photic stimulation were not performed.   ABNORMALITY -Continuous slow, generalized IMPRESSION: This study is suggestive of severe diffuse encephalopathy, nonspecific etiology but could be related to sedation, anoxic/hypoxic brain injury. No seizures or epileptiform discharges were seen throughout the recording. Lora Havens   CT HEAD WO CONTRAST  Result Date: 07/17/2020 CLINICAL DATA:  Mental status change. Patient intubated. Status post cardiac arrest. EXAM: CT HEAD WITHOUT CONTRAST TECHNIQUE: Contiguous axial images were obtained from the base of the skull through the vertex without intravenous contrast. COMPARISON:  CT head without contrast 08/21/2019 FINDINGS: Brain: Patchy areas of hypoattenuation are present in the right ACA and PCA territories. There is focal loss cortical hyperintensity sulcal effacement. Right ACA territory is spared. Mild white matter changes are present bilaterally otherwise. A remote left temporal lobe infarct is stable. Left hemisphere is otherwise unremarkable. Basal ganglia are within normal limits bilaterally. Brainstem and cerebellum are normal. Left para falcine meningioma is stable. Vascular: Atherosclerotic calcifications are present within the cavernous internal carotid arteries bilaterally. No hyperdense vessel is present. Skull: Insert normal skull No significant extracranial soft tissue lesion is present. Sinuses/Orbits: Patient is intubated. Fluid is  present nasopharynx. Bilateral maxillary antrostomies are present. Diffuse mucosal thickening is present maxillary sinuses bilaterally without evidence of chronic disease. Scattered ethmoid opacification present well. The mastoid air cells are clear. Bilateral exophthalmos is chronic. Globes and orbits are otherwise within normal limits. IMPRESSION: 1. Patchy areas of hypoattenuation in the right ACA and PCA territories compatible with acute/subacute infarcts. Local mass effect present with some effacement of the sulci but no midline shift. 2. No acute hemorrhage. 3. Remote left temporal lobe infarct. 4. Atherosclerosis. These results were called by telephone at the time of interpretation on 07/17/2020 at 2:39 pm to provider Darel Hong, NP , who verbally acknowledged these results. Electronically Signed   By: San Morelle M.D.   On: 07/17/2020 14:39   DG Chest Port 1 View  Result Date: 07/18/2020 CLINICAL DATA:  Acute respiratory failure, hypoxia EXAM: PORTABLE CHEST 1 VIEW COMPARISON:  None. FINDINGS: Endotracheal tube and nasogastric tube are  unchanged. Moderate left pleural effusion again identified with associated retrocardiac opacification. Small right pleural effusion suspected with opacification of the right lung base. Mild perihilar interstitial pulmonary edema is present, possibly cardiogenic in nature. Mild cardiomegaly is stable. No pneumothorax. IMPRESSION: Mild perihilar pulmonary edema and bilateral pleural effusions, right greater than left in keeping with mild cardiogenic failure, unchanged. Stable support tubes. Electronically Signed   By: Fidela Salisbury MD   On: 07/18/2020 05:21   DG Chest Port 1 View  Result Date: 07/17/2020 CLINICAL DATA:  Intubated patient status post acute hypoxic respiratory failure 07/14/2020. EXAM: PORTABLE CHEST 1 VIEW COMPARISON:  Single-view of the chest 07/16/2020. FINDINGS: NG tube courses into the stomach and below the inferior margin of the film.  Endotracheal tube tip is approximately 2 cm above the carina. Bibasilar airspace disease and effusions persist. There is cardiomegaly. No pneumothorax. IMPRESSION: No change in bilateral effusions and airspace disease. Electronically Signed   By: Inge Rise M.D.   On: 07/17/2020 12:38   ECHOCARDIOGRAM COMPLETE  Result Date: 07/16/2020    ECHOCARDIOGRAM REPORT   Patient Name:   ADRIEL KAUN Date of Exam: 07/16/2020 Medical Rec #:  [id number]       Height:       66.0 in Accession #:    [id number]      Weight:       253.1 lb Date of Birth:  02-04-1946      BSA:          2.210 m Patient Age:    70 years        BP:           113/40 mmHg Patient Gender: M               HR:           72 bpm. Exam Location:  [facility] Procedure: 2D Echo, Color Doppler, Cardiac Doppler and Intracardiac            Opacification Agent Indications:     I46.9 Cardiac arrest  History:         Patient has prior history of Echocardiogram examinations. COPD;                  Risk Factors:Current Smoker. ETOH abuse.  Sonographer:     Charmayne Sheer RDCS (AE) Referring Phys:  [id number] Cheryll Dessert Diagnosing Phys: Bartholome Bill MD  Sonographer Comments: Technically difficult study due to poor echo windows and echo performed with patient supine and on artificial respirator. Image acquisition challenging due to patient body habitus. IMPRESSIONS  1. Left ventricular ejection fraction, by estimation, is 55 to 60%. The left ventricle has normal function. Left ventricular endocardial border not optimally defined to evaluate regional wall motion. Left ventricular diastolic function could not be evaluated.  2. Right ventricular systolic function is normal. The right ventricular size is normal.  3. The mitral valve is grossly normal. No evidence of mitral valve regurgitation.  4. The aortic valve was not well visualized. Aortic valve regurgitation is mild. Mild to moderate aortic valve stenosis. FINDINGS  Left Ventricle: Left ventricular ejection  fraction, by estimation, is 55 to 60%. The left ventricle has normal function. Left ventricular endocardial border not optimally defined to evaluate regional wall motion. Definity contrast agent was given IV to delineate the left ventricular endocardial borders. The left ventricular internal cavity size was normal in size. There is no left ventricular hypertrophy. Left ventricular diastolic function could not be evaluated. Right Ventricle:  The right ventricular size is normal. No increase in right ventricular wall thickness. Right ventricular systolic function is normal. Left Atrium: Left atrial size was normal in size. Right Atrium: Right atrial size was normal in size. Pericardium: There is no evidence of pericardial effusion. Mitral Valve: The mitral valve is grossly normal. No evidence of mitral valve regurgitation. MV peak gradient, 6.0 mmHg. The mean mitral valve gradient is 2.0 mmHg. Tricuspid Valve: The tricuspid valve is not well visualized. Tricuspid valve regurgitation is trivial. Aortic Valve: The aortic valve was not well visualized. Aortic valve regurgitation is mild. Aortic regurgitation PHT measures 376 msec. Mild to moderate aortic stenosis is present. Aortic valve mean gradient measures 16.0 mmHg. Aortic valve peak gradient  measures 32.6 mmHg. Aortic valve area, by VTI measures 0.98 cm. Pulmonic Valve: The pulmonic valve was not assessed. Pulmonic valve regurgitation is not visualized. Aorta: The aortic root was not well visualized. IAS/Shunts: The interatrial septum was not well visualized.  LEFT VENTRICLE PLAX 2D LVOT diam:     2.10 cm      Diastology LV SV:         44           LV e' medial:    3.92 cm/s LV SV Index:   20           LV E/e' medial:  26.0 LVOT Area:     3.46 cm     LV e' lateral:   4.90 cm/s                             LV E/e' lateral: 20.8  LV Volumes (MOD) LV vol d, MOD A2C: 107.0 ml LV vol d, MOD A4C: 80.6 ml LV vol s, MOD A2C: 39.1 ml LV vol s, MOD A4C: 32.0 ml LV SV MOD  A2C:     67.9 ml LV SV MOD A4C:     80.6 ml LV SV MOD BP:      57.7 ml RIGHT VENTRICLE RV Basal diam:  3.41 cm LEFT ATRIUM             Index       RIGHT ATRIUM           Index LA Vol (A2C):   47.1 ml 21.31 ml/m RA Area:     10.50 cm LA Vol (A4C):   49.6 ml 22.44 ml/m RA Volume:   17.70 ml  8.01 ml/m LA Biplane Vol: 50.2 ml 22.71 ml/m  AORTIC VALVE AV Area (Vmax):    1.18 cm AV Area (Vmean):   1.03 cm AV Area (VTI):     0.98 cm AV Vmax:           285.33 cm/s AV Vmean:          182.000 cm/s AV VTI:            0.453 m AV Peak Grad:      32.6 mmHg AV Mean Grad:      16.0 mmHg LVOT Vmax:         97.30 cm/s LVOT Vmean:        53.900 cm/s LVOT VTI:          0.128 m LVOT/AV VTI ratio: 0.28 AI PHT:            376 msec MITRAL VALVE MV Area (PHT): 4.89 cm     SHUNTS MV Peak grad:  6.0 mmHg  Systemic VTI:  0.13 m MV Mean grad:  2.0 mmHg     Systemic Diam: 2.10 cm MV Vmax:       1.22 m/s MV Vmean:      66.2 cm/s MV Decel Time: 155 msec MV E velocity: 102.00 cm/s Bartholome Bill MD Electronically signed by Bartholome Bill MD Signature Date/Time: 07/16/2020/4:59:28 PM    Final       Assessment & Plan: Pt is a 74 y.o. male with a PMHx of COPD, chronic diastolic heart failure, hypertension, morbid obesity, alcohol abuse, tobacco abuse, who was admitted to [facility] on 06/25/2020 for evaluation of cardiac arrest.   1.  Acute kidney injury.  Likely secondary to cardiac arrest and subsequent renal ischemia.  Baseline creatinine 0.8 on 08/17/2019.  Good urine output noted.  No acute indication for dialysis.  Avoid nephrotoxins as possible.  Continue to monitor renal parameters daily.  2.  Acute respiratory failure/PEA cardiac arrest.  Cardiology and pulmonary/critical care following closely.  Continue ventilatory support at this time.  3.  Further plan as patient progresses.

## 2020-07-18 NOTE — Progress Notes (Signed)
Progress Note  Patient Name: Antonio [name] Date of Encounter: 07/18/2020  [facility] Cardiologist: [facility]  Subjective   Intubated sedated Heparin held after CT scan concerning for stroke Patchy areas of hypoattenuation in the right ACA and PCA territories compatible with acute/subacute infarcts Maintaining normal sinus rhythm  Inpatient Medications    Scheduled Meds: . aspirin  81 mg Per Tube Daily  . atorvastatin  40 mg Per Tube Daily  . Chlorhexidine Gluconate Cloth  6 each Topical Daily  . docusate  100 mg Per Tube BID  . famotidine  20 mg Per Tube BID  . feeding supplement (PROSource TF)  90 mL Per Tube TID  . folic acid  2 mg Oral Daily  . multivitamin with minerals  1 tablet Oral Daily  . mupirocin ointment  1 application Nasal BID  . thiamine injection  100 mg Intravenous Daily   Continuous Infusions: . sodium chloride 5 mL/hr at 07/18/20 0500  . amiodarone Stopped (07/17/20 0231)  . feeding supplement (VITAL HIGH PROTEIN) 45 mL/hr at 07/18/20 0500  . fentaNYL infusion INTRAVENOUS 100 mcg/hr (07/18/20 0552)  . phenylephrine (NEO-SYNEPHRINE) Adult infusion Stopped (07/17/20 1220)  . piperacillin-tazobactam (ZOSYN)  IV 3.375 g (07/18/20 0556)  . propofol (DIPRIVAN) infusion 30 mcg/kg/min (07/18/20 0952)   PRN Meds: acetaminophen, fentaNYL, fentaNYL (SUBLIMAZE) injection, fentaNYL (SUBLIMAZE) injection, polyethylene glycol   Vital Signs    Vitals:   07/18/20 0400 07/18/20 0500 07/18/20 0600 07/18/20 0700  BP: (!) 108/35 (!) 154/69 (!) 114/43 (!) 122/36  Pulse: (!) 57 93 82 63  Resp: (!) 24 (!) 24 (!) 24 (!) 24  Temp: 98.78 F (37.1 C) 99.32 F (37.4 C) 100.04 F (37.8 C) 99.14 F (37.3 C)  TempSrc:      SpO2: 97% 98% 96% 97%  Weight:      Height:        Intake/Output Summary (Last 24 hours) at 07/18/2020 1040 Last data filed at 07/18/2020 0500 Gross per 24 hour  Intake 1410.63 ml  Output 1225 ml  Net 185.63 ml   Last 3 Weights 07/16/2020  08/26/2019 08/18/2019  Weight (lbs) 253 lb 1.4 oz 229 lb 11.5 oz 327 lb  Weight (kg) 114.8 kg 104.2 kg 148.326 kg      Telemetry    Normal sinus rhythm- Personally Reviewed  ECG    - Personally Reviewed  Physical Exam   GEN:  Intubated sedated Neck:  Unable to estimate JVD Cardiac: RRR, no murmurs, rubs, or gallops.  Respiratory: Clear to auscultation bilaterally. GI: Soft, non-distended  MS: No edema; No deformity. Neuro:   Unable to test Psych: Unable to test  Labs    High Sensitivity Troponin:   Recent Labs  Lab 07/17/2020 2113 07/13/2020 2252 07/16/20 0510 07/16/20 0634  TROPONINIHS 162* 297* 1,455* 1,380*      Chemistry Recent Labs  Lab 07/02/2020 2113 07/16/20 0510 07/16/20 1418 07/17/20 0702 07/18/20 0510  NA 139 137 137 136 137  K 3.9 4.4 4.9 4.0 3.7  CL 104 103 104 102 103  CO2 22 25 24 24 24   GLUCOSE 312* 143* 120* 98 107*  BUN 15 21 26* 36* 45*  CREATININE 1.62* 1.62* 2.03* 2.40* 1.69*  CALCIUM 8.0* 8.0* 7.9* 7.5* 8.1*  PROT 7.0 6.8  --   --   --   ALBUMIN 3.4* 3.4*  --   --   --   AST 55* 47*  --   --   --   ALT 35 38  --   --   --  ALKPHOS 75 60  --   --   --   BILITOT 0.8 0.6  --   --   --   GFRNONAA 44* 44* 34* 27* 42*  ANIONGAP 13 9 9 10 10      Hematology Recent Labs  Lab 07/16/20 0510 07/17/20 0702 07/18/20 0510  WBC 14.3* 15.1* 12.4*  RBC 4.38 3.83* 4.19*  HGB 14.4 12.4* 13.6  HCT 42.9 38.6* 41.5  MCV 97.9 100.8* 99.0  MCH 32.9 32.4 32.5  MCHC 33.6 32.1 32.8  RDW 14.1 14.5 14.6  PLT 284 288 232    BNP Recent Labs  Lab 06/23/2020 2113 07/16/20 0510  BNP 562.8* 1,001.1*     DDimer No results for input(s): DDIMER in the last 168 hours.   Radiology    EEG  Result Date: 07/17/2020 Lora Havens, MD     07/17/2020 10:21 PM Patient Name: Antonio [name] MRN: [id number] Epilepsy Attending: Lora Havens Referring Physician/Provider: Darel Hong, NP Date: 07/17/2020 Duration: Patient history: 74yo M s/p cardiac  arrest. EEG to evaluate for seizure. Level of alertness:  Comatose AEDs during EEG study: Propofol Technical aspects: This EEG study was done with scalp electrodes positioned according to the 10-20 International system of electrode placement. Electrical activity was acquired at a sampling rate of 500Hz  and reviewed with a high frequency filter of 70Hz  and a low frequency filter of 1Hz . EEG data were recorded continuously and digitally stored. Description: EEG showed continuous generalized predominantly 2-3 Hz delta as well as 5-6Hz  theta slowing. EEG was not reactive to noxious stimuli.  Hyperventilation and photic stimulation were not performed.   ABNORMALITY -Continuous slow, generalized IMPRESSION: This study is suggestive of severe diffuse encephalopathy, nonspecific etiology but could be related to sedation, anoxic/hypoxic brain injury. No seizures or epileptiform discharges were seen throughout the recording. Lora Havens   CT HEAD WO CONTRAST  Result Date: 07/17/2020 CLINICAL DATA:  Mental status change. Patient intubated. Status post cardiac arrest. EXAM: CT HEAD WITHOUT CONTRAST TECHNIQUE: Contiguous axial images were obtained from the base of the skull through the vertex without intravenous contrast. COMPARISON:  CT head without contrast 08/21/2019 FINDINGS: Brain: Patchy areas of hypoattenuation are present in the right ACA and PCA territories. There is focal loss cortical hyperintensity sulcal effacement. Right ACA territory is spared. Mild white matter changes are present bilaterally otherwise. A remote left temporal lobe infarct is stable. Left hemisphere is otherwise unremarkable. Basal ganglia are within normal limits bilaterally. Brainstem and cerebellum are normal. Left para falcine meningioma is stable. Vascular: Atherosclerotic calcifications are present within the cavernous internal carotid arteries bilaterally. No hyperdense vessel is present. Skull: Insert normal skull No significant  extracranial soft tissue lesion is present. Sinuses/Orbits: Patient is intubated. Fluid is present nasopharynx. Bilateral maxillary antrostomies are present. Diffuse mucosal thickening is present maxillary sinuses bilaterally without evidence of chronic disease. Scattered ethmoid opacification present well. The mastoid air cells are clear. Bilateral exophthalmos is chronic. Globes and orbits are otherwise within normal limits. IMPRESSION: 1. Patchy areas of hypoattenuation in the right ACA and PCA territories compatible with acute/subacute infarcts. Local mass effect present with some effacement of the sulci but no midline shift. 2. No acute hemorrhage. 3. Remote left temporal lobe infarct. 4. Atherosclerosis. These results were called by telephone at the time of interpretation on 07/17/2020 at 2:39 pm to provider Darel Hong, NP , who verbally acknowledged these results. Electronically Signed   By: San Morelle M.D.   On: 07/17/2020 14:39  US Venous Img Lower Bilateral (DVT)  Result Date: 07/16/2020 CLINICAL DATA:  74 year old male with history of swelling EXAM: BILATERAL LOWER EXTREMITY VENOUS DOPPLER ULTRASOUND TECHNIQUE: Gray-scale sonography with graded compression, as well as color Doppler and duplex ultrasound were performed to evaluate the lower extremity deep venous systems from the level of the common femoral vein and including the common femoral, femoral, profunda femoral, popliteal and calf veins including the posterior tibial, peroneal and gastrocnemius veins when visible. The superficial great saphenous vein was also interrogated. Spectral Doppler was utilized to evaluate flow at rest and with distal augmentation maneuvers in the common femoral, femoral and popliteal veins. COMPARISON:  None. FINDINGS: RIGHT LOWER EXTREMITY Common Femoral Vein: No evidence of thrombus. Normal compressibility, respiratory phasicity and response to augmentation. Saphenofemoral Junction: No evidence of  thrombus. Normal compressibility and flow on color Doppler imaging. Profunda Femoral Vein: No evidence of thrombus. Normal compressibility and flow on color Doppler imaging. Femoral Vein: No evidence of thrombus. Normal compressibility, respiratory phasicity and response to augmentation. Popliteal Vein: No evidence of thrombus. Normal compressibility, respiratory phasicity and response to augmentation. Calf Veins: Posterior tibial vein patent with compressibility. Limited visualization of the peroneal vein. Superficial Great Saphenous Vein: No evidence of thrombus. Normal compressibility and flow on color Doppler imaging. Other Findings:  None. LEFT LOWER EXTREMITY Common Femoral Vein: No evidence of thrombus. Normal compressibility, respiratory phasicity and response to augmentation. Saphenofemoral Junction: No evidence of thrombus. Normal compressibility and flow on color Doppler imaging. Profunda Femoral Vein: No evidence of thrombus. Normal compressibility and flow on color Doppler imaging. Femoral Vein: No evidence of thrombus. Normal compressibility, respiratory phasicity and response to augmentation. Popliteal Vein: No evidence of thrombus. Normal compressibility, respiratory phasicity and response to augmentation. Calf Veins: Posterior tibial vein patent with compressibility. Limited evaluation of the peroneal vein. Superficial Great Saphenous Vein: No evidence of thrombus. Normal compressibility and flow on color Doppler imaging. Other Findings:  None. IMPRESSION: Sonographic survey of the bilateral lower extremities negative for DVT Electronically Signed   By: Corrie Mckusick D.O.   On: 07/16/2020 11:54   DG Chest Port 1 View  Result Date: 07/18/2020 CLINICAL DATA:  Acute respiratory failure, hypoxia EXAM: PORTABLE CHEST 1 VIEW COMPARISON:  None. FINDINGS: Endotracheal tube and nasogastric tube are unchanged. Moderate left pleural effusion again identified with associated retrocardiac opacification.  Small right pleural effusion suspected with opacification of the right lung base. Mild perihilar interstitial pulmonary edema is present, possibly cardiogenic in nature. Mild cardiomegaly is stable. No pneumothorax. IMPRESSION: Mild perihilar pulmonary edema and bilateral pleural effusions, right greater than left in keeping with mild cardiogenic failure, unchanged. Stable support tubes. Electronically Signed   By: Fidela Salisbury MD   On: 07/18/2020 05:21   DG Chest Port 1 View  Result Date: 07/17/2020 CLINICAL DATA:  Intubated patient status post acute hypoxic respiratory failure 07/01/2020. EXAM: PORTABLE CHEST 1 VIEW COMPARISON:  Single-view of the chest 07/16/2020. FINDINGS: NG tube courses into the stomach and below the inferior margin of the film. Endotracheal tube tip is approximately 2 cm above the carina. Bibasilar airspace disease and effusions persist. There is cardiomegaly. No pneumothorax. IMPRESSION: No change in bilateral effusions and airspace disease. Electronically Signed   By: Inge Rise M.D.   On: 07/17/2020 12:38   DG Chest Port 1 View  Result Date: 07/16/2020 CLINICAL DATA:  Shortness of breath, cardiac arrest EXAM: PORTABLE CHEST 1 VIEW COMPARISON:  07/16/2020 FINDINGS: Mild bibasilar opacities, likely reflecting small bilateral pleural effusions,  right greater than left. Associated right lower lobe atelectasis. No frank interstitial edema. No pneumothorax. Endotracheal tube terminates 15 mm above the carina. Enteric tube courses into the mid stomach. The heart is normal in size. IMPRESSION: Small bilateral pleural effusions, right greater than left. Associated right lower lobe atelectasis. Endotracheal tube terminates 15 mm above the carina. Enteric tube courses into the mid stomach. Electronically Signed   By: Julian Hy M.D.   On: 07/16/2020 11:55   ECHOCARDIOGRAM COMPLETE  Result Date: 07/16/2020    ECHOCARDIOGRAM REPORT   Patient Name:   Antonio [name] Date of  Exam: 07/16/2020 Medical Rec #:  [id number]       Height:       66.0 in Accession #:    [id number]      Weight:       253.1 lb Date of Birth:  August 11, 1945      BSA:          2.210 m Patient Age:    74 years        BP:           113/40 mmHg Patient Gender: M               HR:           72 bpm. Exam Location:  [facility] Procedure: 2D Echo, Color Doppler, Cardiac Doppler and Intracardiac            Opacification Agent Indications:     I46.9 Cardiac arrest  History:         Patient has prior history of Echocardiogram examinations. COPD;                  Risk Factors:Current Smoker. ETOH abuse.  Sonographer:     Charmayne Sheer RDCS (AE) Referring Phys:  [id number] Cheryll Dessert Diagnosing Phys: Bartholome Bill MD  Sonographer Comments: Technically difficult study due to poor echo windows and echo performed with patient supine and on artificial respirator. Image acquisition challenging due to patient body habitus. IMPRESSIONS  1. Left ventricular ejection fraction, by estimation, is 55 to 60%. The left ventricle has normal function. Left ventricular endocardial border not optimally defined to evaluate regional wall motion. Left ventricular diastolic function could not be evaluated.  2. Right ventricular systolic function is normal. The right ventricular size is normal.  3. The mitral valve is grossly normal. No evidence of mitral valve regurgitation.  4. The aortic valve was not well visualized. Aortic valve regurgitation is mild. Mild to moderate aortic valve stenosis. FINDINGS  Left Ventricle: Left ventricular ejection fraction, by estimation, is 55 to 60%. The left ventricle has normal function. Left ventricular endocardial border not optimally defined to evaluate regional wall motion. Definity contrast agent was given IV to delineate the left ventricular endocardial borders. The left ventricular internal cavity size was normal in size. There is no left ventricular hypertrophy. Left ventricular diastolic function could not be  evaluated. Right Ventricle: The right ventricular size is normal. No increase in right ventricular wall thickness. Right ventricular systolic function is normal. Left Atrium: Left atrial size was normal in size. Right Atrium: Right atrial size was normal in size. Pericardium: There is no evidence of pericardial effusion. Mitral Valve: The mitral valve is grossly normal. No evidence of mitral valve regurgitation. MV peak gradient, 6.0 mmHg. The mean mitral valve gradient is 2.0 mmHg. Tricuspid Valve: The tricuspid valve is not well visualized. Tricuspid valve regurgitation is trivial. Aortic Valve: The aortic valve was  not well visualized. Aortic valve regurgitation is mild. Aortic regurgitation PHT measures 376 msec. Mild to moderate aortic stenosis is present. Aortic valve mean gradient measures 16.0 mmHg. Aortic valve peak gradient  measures 32.6 mmHg. Aortic valve area, by VTI measures 0.98 cm. Pulmonic Valve: The pulmonic valve was not assessed. Pulmonic valve regurgitation is not visualized. Aorta: The aortic root was not well visualized. IAS/Shunts: The interatrial septum was not well visualized.  LEFT VENTRICLE PLAX 2D LVOT diam:     2.10 cm      Diastology LV SV:         44           LV e' medial:    3.92 cm/s LV SV Index:   20           LV E/e' medial:  26.0 LVOT Area:     3.46 cm     LV e' lateral:   4.90 cm/s                             LV E/e' lateral: 20.8  LV Volumes (MOD) LV vol d, MOD A2C: 107.0 ml LV vol d, MOD A4C: 80.6 ml LV vol s, MOD A2C: 39.1 ml LV vol s, MOD A4C: 32.0 ml LV SV MOD A2C:     67.9 ml LV SV MOD A4C:     80.6 ml LV SV MOD BP:      57.7 ml RIGHT VENTRICLE RV Basal diam:  3.41 cm LEFT ATRIUM             Index       RIGHT ATRIUM           Index LA Vol (A2C):   47.1 ml 21.31 ml/m RA Area:     10.50 cm LA Vol (A4C):   49.6 ml 22.44 ml/m RA Volume:   17.70 ml  8.01 ml/m LA Biplane Vol: 50.2 ml 22.71 ml/m  AORTIC VALVE AV Area (Vmax):    1.18 cm AV Area (Vmean):   1.03 cm AV Area  (VTI):     0.98 cm AV Vmax:           285.33 cm/s AV Vmean:          182.000 cm/s AV VTI:            0.453 m AV Peak Grad:      32.6 mmHg AV Mean Grad:      16.0 mmHg LVOT Vmax:         97.30 cm/s LVOT Vmean:        53.900 cm/s LVOT VTI:          0.128 m LVOT/AV VTI ratio: 0.28 AI PHT:            376 msec MITRAL VALVE MV Area (PHT): 4.89 cm     SHUNTS MV Peak grad:  6.0 mmHg     Systemic VTI:  0.13 m MV Mean grad:  2.0 mmHg     Systemic Diam: 2.10 cm MV Vmax:       1.22 m/s MV Vmean:      66.2 cm/s MV Decel Time: 155 msec MV E velocity: 102.00 cm/s Bartholome Bill MD Electronically signed by Bartholome Bill MD Signature Date/Time: 07/16/2020/4:59:28 PM    Final     Cardiac Studies   Echocardiogram 1. Left ventricular ejection fraction, by estimation, is 55 to 60%. The  left ventricle has normal function.  Left ventricular endocardial border  not optimally defined to evaluate regional wall motion. Left ventricular  diastolic function could not be  evaluated.  2. Right ventricular systolic function is normal. The right ventricular  size is normal.  3. The mitral valve is grossly normal. No evidence of mitral valve  regurgitation.  4. The aortic valve was not well visualized. Aortic valve regurgitation  is mild. Mild to moderate aortic valve stenosis.   Patient Profile     Mr. Daigle is a 74 year old gentleman with COPD, prior alcohol smoking history, medication noncompliance, hypertension, morbid obesity, January 2021 with acute hypoxic and hypercapnic respiratory failure requiring 2-week intubation, presenting with worsening shortness of breath, respiratory distress   Assessment & Plan    Non-STEMI  In the setting of acute on chronic respiratory failure with hypoxia  Underlying COPD , sleep apnea, morbid obesity Enzymes starting to trend downward, now 1300  Echocardiogram with no focal wall motion abnormality  -Heparin infusion held for concern of stroke -No plan for cardiac  catheterization, We will need to monitor mental status/encephalopathy -If family wishes for aggressive care, cardiac catheterization could be considered next week  Nonsustained VT  Arrhythmia has resolved,  EF normal on echo  Amiodarone infusion held   Acute renal failure  Likely component of ATN Improving on repeat numbers today We will continue to hold off on diuretics at this time   PEA arrest   acute on chronic hypoxic and hypercarbic respiratory failure  Possibly exacerbated by COPD, pulmonary edema  We will consider starting Lasix once renal function stabilizes    Total encounter time more than 25 minutes  Greater than 50% was spent in counseling and coordination of care with the patient   For questions or updates, please contact [facility] Please consult [email or web address] for contact info under        Signed, Julien Nordmann, MD  07/18/2020, 10:40 AM

## 2020-07-18 NOTE — Progress Notes (Signed)
NAME:  Antonio [name], MRN:  [id number], DOB:  1945/08/30, LOS: 2 ADMISSION DATE:  07/05/2020, CONSULTATION DATE:  07/16/2020 REFERRING MD:  Dr. Charna Archer, CHIEF COMPLAINT:  Cardiac arrest  Brief History:  [name] is a 74 year old male presenting with acute hypoxic respiratory failure, subsequently PEA arrest with ROSC after 15 minutes, decompensated heart failure.   History of Present Illness:  [name] is a 74 year old male with history of COPD, chronic diastolic heart failure, hypertension, morbid obesity, alcohol abuse, tobacco abuse and poor healthcare follow-up who was brought to the ED via EMS after his roommate called his son to tell him the patient was having difficulty breathing.  The son asked the roommate to called 34 which he did.    EMS found the patient to be in respiratory distress and was placed on BiPAP.  Just upon arrival to ED, the patient reportedly lost consciousness, and was found to be in PEA arrest.  He received about 4 rounds of epinephrine and achieved ROSC after approximately 15 minutes.  He was intubated.  After achieving ROSC, the patient was then having runs of non-sustained Vtach for which he was started on Amiodarone drip.  He is being sedated with propofol and has stable blood pressures without need for vasopressors.  He was noted to have pink frothy secretions after intubation, and CXR showed increased interstitial markings suggestive of pulmonary edema, and bilateral patchy airspace opacities.  Viral panel was negative for COVID and influenza A/B.  hsTrop was initially 162, then 297, BNP of 562 and lactic acid was 5.4.   The patient's 2 sons -- Benoit Meech and [place] were bedside, and has elected to make the patient DNR.    Of note, the patient was admitted here 07/27/19 to 08/20/19 for acute hypoxic and hypercapnic respiratory failure related to COPD and heart failure exacerbation for which he required intubation for nearly 2 weeks.    According the the sons, the patient had not seen a PCP since his discharge from the hospital in Feb 2021, and has not been on any medications.  At baseline, he is fully functional, drives, does grocery shopping, etc, but has exercise limitations.  He lives alone but rents a room.  Per the sons, he sometimes drinks several cans of beer in day but is not a daily drinker. One of his sons says he spoke to him earlier in the morning, and he said he was feeling great.   Past Medical History:  COPD ETOH abuse Smoker  Significant Hospital Events:  12/30: on WUA, no purposeful movement and does not track, very asynchronous with vent and hypoxic; CT Head obtained showing:1. Patchy areas of hypoattenuation in the right ACA and PCA territories compatible with acute/subacute infarcts. Local mass effect present with some effacement of the sulci but no midline Shift. 2. No acute hemorrhage. 3. Remote left temporal lobe infarct. 4. Atherosclerosis. 12/31: Off vasopressors.  Amiodarone on hold due to Bradycardia.  Plan for MRI Brain.   Consults:  PCCM Cardiology Neurology  Palliative Care  Procedures:  N/A  Significant Diagnostic Tests:  12/30: EEG>>This study is suggestive of severe diffuse encephalopathy, nonspecific etiology but could be related to sedation, anoxic/hypoxic brain injury. No seizures or epileptiform discharges were seen throughout the recording. 12/30: CT Head>>1. Patchy areas of hypoattenuation in the right ACA and PCA territories compatible with acute/subacute infarcts. Local mass effect present with some effacement of the sulci but no midline shift. 2. No acute hemorrhage.  3. Remote left temporal lobe infarct. 4. Atherosclerosis. 12/31: MRI Brain>>   Micro Data:  12/28: SARS-CoV-2 PCR>>negative 12/28: Influenza PCR>>negative 12/28: Blood culture>> 12/29: Urine>> 12/30: Tracheal aspirate>>  Antimicrobials:  Zosyn 12/29>>  Interim History / Subjective:  No acute  events reported overnight Afebrile, off vasopressors Vent: 40% FiO2, 5 PEEP with O2 sats 97-88% Plan for MRI today   Objective   Blood pressure (!) 122/36, pulse 63, temperature 99.14 F (37.3 C), resp. rate (!) 24, height 5\' 6"  (1.676 m), weight 114.8 kg, SpO2 97 %.    Vent Mode: PRVC FiO2 (%):  [30 %-100 %] 40 % Set Rate:  [24 bmp] 24 bmp Vt Set:  [450 mL] 450 mL PEEP:  [5 cmH20-10 cmH20] 5 cmH20 Plateau Pressure:  [25 cmH20] 25 cmH20   Intake/Output Summary (Last 24 hours) at 07/18/2020 08/17/2020 Last data filed at 07/18/2020 0500 Gross per 24 hour  Intake 2005.69 ml  Output 1225 ml  Net 780.69 ml   Filed Weights   07/16/20 0200  Weight: 114.8 kg    Examination: General: Critically ill appearing male, laying in bed, intubated and sedated, in no acute distress HENT: Atraumatic, normocephalic, neck supple, no JVD, ETT in place Lungs: Clear breath sounds bilaterally, no wheezing or rales, synchronous with vent, even Cardiovascular: Bradycardia, regular rhythm, s1s2, no M/R/G Abdomen: Obese, soft, nontender, nondistended, no guarding or rebound tenderness, BS+ x4 Extremities: No deformities, no clubbing, 1+ edema bilateral LE's Neuro: Sedated, withdraws from pain, upward gaze, pupils PERRL (sluggish) Skin: Warm and dry.  No obvious rashes, lesions, or ulcerations  Resolved Hospital Problem list   N/A  Assessment & Plan:   Acute hypoxic respiratory failure Acute exacerbation of diastolic heart failure/ pulmonary edema COPD exacerbation ? Aspiration Pneomonia Noted to have pink frothy secretions after intubation, and CXR showed increased interstitial markings suggestive of pulmonary edema, and bilateral patchy airspace opacities.  Viral panel was negative for COVID and influenza A/B.  hsTrop was intially 162, then 297, BNP of 562 and lactic acid was 5.4.  Per son, no report of recent illness, illness appears quite acute.  Differential includes acute flash pulmonary edema/heart  failure, acute COPD exacerbation, aspiration pneumonitis, pulmonary embolism or acute ischemic heart.  Poor window on bedside cardiac ECHO, but lung exam notable for B lines suggestive of interstitial process.  Leukocytosis likely stress/reactive due  -Full vent support with lung protective strategies (6 cc/kg IBW) -Wean FiO2 and PEEP as tolerated to maintain O2 sats greater than 92% -Follow intermittent chest x-ray and ABG as needed -VAP bundle implemented -Spontaneous breathing trials when respiratory parameters met and mental status permits -Diuresis as blood pressure and renal function permits, currently unable to diurese due to AKI -Monitor fever curve -Trend WBC's and Procalcitonin -Tracheal aspirate pending -Continue Zosyn for now   PEA arrest Non-sustained Vtach Elevated troponin, demand ischemia versus NSTEMI PEA arrest likely result of acute respiratory failure; no strong evidence of ACS.  Runs of non-sustained Vtach  -Continuous cardiac monitoring -Maintain MAP >65 -Vasopressors as needed to maintain MAP goal>> currently weaned off -Troponin peaked at 1,455 -Heparin gtt discontinued due to new finding of Ischemic CVA's as per discussion with ICU Attending and Neurology -Cardiology following, appreciate input -Aspirin and statin -Amiodarone currently on hold due to Bradycardia -Unable to diurese due to AKI -Echocardiogram/troponin with LVEF 55 to 60%, diastolic function unable to be evaluated, RV systolic function normal   AKI Cr 1.62, baseline 1.49 from 08/2019. 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 Watershed in the setting of PEA arrest) Sedation needs in setting of mechanical  ventilation -Maintain RASS of 0 to -1 -Propofol and fentanyl as needed to maintain RASS goal -Daily wake-up assessment -CT head on 07/17/20 with patchy acute/subacute infarcts to the right ACA and PCA territories -Neurology consulted, appreciate input -Discussed with Dr. Curly [place] -Obtain EEG -MRI brain today -Continue statin and aspirin -Echo previously obtained 07/16/20    Pt is critically ill with multiorgan failure.  Prognosis is guarded.  High risk for cardiac arrest and death. Palliative Care consulted.       Best practice (evaluated daily)  Diet: NPO Pain/Anxiety/Delirium protocol (if indicated): Propofol, Fentanyl VAP protocol (if indicated): Yes DVT prophylaxis: SCD's, Heparin SQ GI prophylaxis: Pepcid Glucose control: N/A Mobility: Bedrest Disposition:ICU  Goals of Care:  Last date of multidisciplinary goals of care discussion:07/18/2020 Family and staff present:  Updated pt's son Seaver Machia via telephone 07/18/20 Summary of discussion: Plan for MRI today to further evaluate stroke, Neurology consulted Follow up goals of care discussion due: 07/19/2020 Code Status: DNR  Labs   CBC: Recent Labs  Lab 06/29/2020 2113 07/16/20 0510 07/17/20 0702 07/18/20 0510  WBC 21.7* 14.3* 15.1* 12.4*  NEUTROABS 11.1* 12.5*  --   --   HGB 14.1 14.4 12.4* 13.6  HCT 44.8 42.9 38.6* 41.5  MCV 102.3* 97.9 100.8* 99.0  PLT 323 284 288 226    Basic Metabolic Panel: Recent Labs  Lab 06/29/2020 2113 07/16/20 0510 07/16/20 1418 07/17/20 0702 07/17/20 1721 07/18/20 0510  NA 139 137 137 136  --  137  K 3.9 4.4 4.9 4.0  --  3.7  CL 104 103 104 102  --  103  CO2 $Re'22 25 24 24  'qHq$ --  24  GLUCOSE 312* 143* 120* 98  --  107*  BUN 15 21 26* 36*  --  45*  CREATININE 1.62* 1.62* 2.03* 2.40*  --  1.69*  CALCIUM 8.0* 8.0* 7.9* 7.5*  --  8.1*  MG 2.4 2.0 2.0 2.1 2.2 2.4  PHOS  --  3.5 5.0* 3.7 3.5 3.0   GFR: Estimated Creatinine Clearance: 45 mL/min (A) (by C-G formula based on SCr  of 1.69 mg/dL (H)). Recent Labs  Lab 06/18/2020 2113 07/13/2020 2246 07/16/20 0510 07/16/20 0511 07/16/20 0634 07/16/20 1224 07/17/20 0702 07/18/20 0510  PROCALCITON  --   --   --   --   --  5.62 2.71 1.79  WBC 21.7*  --  14.3*  --   --   --  15.1* 12.4*  LATICACIDVEN 5.4* 2.1*  --  1.1 1.1  --   --   --     Liver Function Tests: Recent Labs  Lab 06/29/2020 2113 07/16/20 0510  AST 55* 47*  ALT 35 38  ALKPHOS 75 60  BILITOT 0.8 0.6  PROT 7.0 6.8  ALBUMIN 3.4* 3.4*   No results for input(s): LIPASE, AMYLASE in the last 168 hours. No results for input(s): AMMONIA in the last 168 hours.  ABG    Component Value Date/Time   PHART 7.34 (L) 07/16/2020 0049   PCO2ART 45 07/16/2020 0049   PO2ART 167 (H) 07/16/2020 0049   HCO3 24.3 07/16/2020 0049   ACIDBASEDEF 1.8 07/16/2020 0049   O2SAT 99.4 07/16/2020  0049     Coagulation Profile: Recent Labs  Lab 07/16/20 0634  INR 1.2    Cardiac Enzymes: No results for input(s): CKTOTAL, CKMB, CKMBINDEX, TROPONINI in the last 168 hours.  HbA1C: Hgb A1c MFr Bld  Date/Time Value Ref Range Status  08/01/2019 04:14 PM 4.9 4.8 - 5.6 % Final    Comment:    (NOTE) Pre diabetes:          5.7%-6.4% Diabetes:              >6.4% Glycemic control for   <7.0% adults with diabetes     CBG: Recent Labs  Lab 07/17/20 1657 07/17/20 1934 07/17/20 2320 07/18/20 0322 07/18/20 0743  GLUCAP 85 81 101* 104* 119*    Review of Systems:   Unable to assess due to critical illness, intubation, and sedation  Past Medical History:  He,  has a past medical history of COPD (chronic obstructive pulmonary disease) ([place]), ETOH abuse, and Smoker.   Surgical History:  No past surgical history on file.   Social History:   reports that he has been smoking cigarettes. He has a 27.50 pack-year smoking history. He has never used smokeless tobacco. He reports that he does not drink alcohol and does not use drugs.   Family History:  His family  history is not on file.   Allergies No Known Allergies   Home Medications  Prior to Admission medications   Not on File     Critical care time: 32 minutes     Darel Hong, [facility] [place] Pager: [phone]

## 2020-07-19 DIAGNOSIS — G9340 Encephalopathy, unspecified: Secondary | ICD-10-CM

## 2020-07-19 DIAGNOSIS — J9602 Acute respiratory failure with hypercapnia: Secondary | ICD-10-CM

## 2020-07-19 LAB — CBC
HCT: 35.2 % — ABNORMAL LOW (ref 39.0–52.0)
Hemoglobin: 11.5 g/dL — ABNORMAL LOW (ref 13.0–17.0)
MCH: 32.3 pg (ref 26.0–34.0)
MCHC: 32.7 g/dL (ref 30.0–36.0)
MCV: 98.9 fL (ref 80.0–100.0)
Platelets: 198 10*3/uL (ref 150–400)
RBC: 3.56 MIL/uL — ABNORMAL LOW (ref 4.22–5.81)
RDW: 14.5 % (ref 11.5–15.5)
WBC: 7.7 10*3/uL (ref 4.0–10.5)
nRBC: 0 % (ref 0.0–0.2)

## 2020-07-19 LAB — LIPID PANEL
Cholesterol: 92 mg/dL (ref 0–200)
HDL: 25 mg/dL — ABNORMAL LOW (ref >40)
LDL Cholesterol: 43 mg/dL (ref 0–99)
Total CHOL/HDL Ratio: 3.7 RATIO
Triglycerides: 122 mg/dL (ref <150)
VLDL: 24 mg/dL (ref 0–40)

## 2020-07-19 LAB — BASIC METABOLIC PANEL
Anion gap: 6 (ref 5–15)
BUN: 40 mg/dL — ABNORMAL HIGH (ref 8–23)
CO2: 26 mmol/L (ref 22–32)
Calcium: 8.1 mg/dL — ABNORMAL LOW (ref 8.9–10.3)
Chloride: 108 mmol/L (ref 98–111)
Creatinine, Ser: 1.44 mg/dL — ABNORMAL HIGH (ref 0.61–1.24)
GFR, Estimated: 51 mL/min — ABNORMAL LOW (ref >60)
Glucose, Bld: 131 mg/dL — ABNORMAL HIGH (ref 70–99)
Potassium: 3.4 mmol/L — ABNORMAL LOW (ref 3.5–5.1)
Sodium: 140 mmol/L (ref 135–145)

## 2020-07-19 LAB — MAGNESIUM: Magnesium: 2.3 mg/dL (ref 1.7–2.4)

## 2020-07-19 LAB — HEMOGLOBIN A1C
Hgb A1c MFr Bld: 4.8 % (ref 4.8–5.6)
Mean Plasma Glucose: 91.06 mg/dL

## 2020-07-19 LAB — GLUCOSE, CAPILLARY
Glucose-Capillary: 92 mg/dL (ref 70–99)
Glucose-Capillary: 90 mg/dL (ref 70–99)
Glucose-Capillary: 98 mg/dL (ref 70–99)

## 2020-07-19 LAB — TRIGLYCERIDES: Triglycerides: 119 mg/dL (ref <150)

## 2020-07-19 LAB — PHOSPHORUS: Phosphorus: 2.6 mg/dL (ref 2.5–4.6)

## 2020-07-19 LAB — PROCALCITONIN: Procalcitonin: 1.07 ng/mL

## 2020-07-19 LAB — TSH: TSH: 1.912 u[IU]/mL (ref 0.350–4.500)

## 2020-07-19 MED ORDER — MIDAZOLAM HCL 2 MG/2ML IJ SOLN: 2.0000 mg | INTRAMUSCULAR | Status: DC | PRN

## 2020-07-19 MED ORDER — MIDAZOLAM HCL 2 MG/2ML IJ SOLN
INTRAMUSCULAR | Status: AC
  Administered 2020-07-19: 2 mg via INTRAVENOUS
  Filled 2020-07-19: qty 2

## 2020-07-19 MED ORDER — MORPHINE 100MG IN NS 100ML (1MG/ML) PREMIX INFUSION
1.0000 mg/h | INTRAVENOUS | Status: DC
  Administered 2020-07-19: 1 mg/h via INTRAVENOUS
  Administered 2020-07-20: 7 mg/h via INTRAVENOUS
  Filled 2020-07-19 (×2): qty 100

## 2020-07-19 MED ORDER — CHLORHEXIDINE GLUCONATE 0.12 % MT SOLN
OROMUCOSAL | Status: AC
  Administered 2020-07-19: 15 mL via OROMUCOSAL
  Filled 2020-07-19: qty 15

## 2020-07-19 MED ORDER — MIDAZOLAM HCL 2 MG/2ML IJ SOLN
2.0000 mg | Freq: Once | INTRAMUSCULAR | Status: AC
  Administered 2020-07-19: 2 mg via INTRAVENOUS

## 2020-07-19 MED ORDER — LORAZEPAM 2 MG/ML IJ SOLN
1.0000 mg | INTRAMUSCULAR | Status: DC | PRN
  Administered 2020-07-20: 1 mg via INTRAVENOUS
  Filled 2020-07-19: qty 1

## 2020-07-19 MED ORDER — MIDAZOLAM HCL 2 MG/2ML IJ SOLN: 2.0000 mg | Freq: Once | INTRAMUSCULAR | Status: AC

## 2020-07-19 MED ORDER — POTASSIUM CHLORIDE 20 MEQ PO PACK
40.0000 meq | PACK | Freq: Once | ORAL | Status: AC
  Administered 2020-07-19: 40 meq
  Filled 2020-07-19: qty 2

## 2020-07-19 MED ORDER — SODIUM CHLORIDE 0.9 % IV SOLN
3.0000 g | Freq: Four times a day (QID) | INTRAVENOUS | Status: DC
  Administered 2020-07-19: 3 g via INTRAVENOUS
  Filled 2020-07-19: qty 3
  Filled 2020-07-19 (×4): qty 8

## 2020-07-19 MED ORDER — IPRATROPIUM-ALBUTEROL 0.5-2.5 (3) MG/3ML IN SOLN
3.0000 mL | Freq: Four times a day (QID) | RESPIRATORY_TRACT | Status: DC
  Administered 2020-07-19 (×2): 3 mL via RESPIRATORY_TRACT
  Filled 2020-07-19 (×2): qty 3

## 2020-07-19 MED ORDER — MIDAZOLAM HCL 2 MG/2ML IJ SOLN
INTRAMUSCULAR | Status: AC
  Filled 2020-07-19: qty 2

## 2020-07-19 MED ORDER — GLYCOPYRROLATE 0.2 MG/ML IJ SOLN
0.2000 mg | INTRAMUSCULAR | Status: DC | PRN
  Administered 2020-07-20: 0.2 mg via INTRAVENOUS
  Filled 2020-07-19: qty 1

## 2020-07-19 NOTE — Consult Note (Signed)
Pharmacy Antibiotic Note  Lenus Trauger is a 75 y.o. male with medical history including COPD, dCHF, HTN, morbid obesity, alcohol abuse, tobacco abuse admitted on 06/19/2020 with acute hypoxic respiratory failure, PEA arrest, NSVT, type II NSTEMI.    Pharmacy has been consulted for Unasyn dosing.  Plan: Unasyn 3g q6h   Height: 5\' 6"  (167.6 cm) Weight: 117.2 kg (258 lb 6.1 oz) IBW/kg (Calculated) : 63.8  Temp (24hrs), Avg:98.6 F (37 C), Min:95.9 F (35.5 C), Max:99.5 F (37.5 C)  Recent Labs  Lab 06/25/2020 2113 06/24/2020 2246 07/16/20 0510 07/16/20 0511 07/16/20 0634 07/16/20 1418 07/17/20 0702 07/18/20 0510 07/19/20 0512  WBC 21.7*  --  14.3*  --   --   --  15.1* 12.4* 7.7  CREATININE 1.62*  --  1.62*  --   --  2.03* 2.40* 1.69* 1.44*  LATICACIDVEN 5.4* 2.1*  --  1.1 1.1  --   --   --   --     Estimated Creatinine Clearance: 53.4 mL/min (A) (by C-G formula based on SCr of 1.44 mg/dL (H)).    No Known Allergies  Antimicrobials this admission: Zosyn 12/29 >> 1/1 Unasyn 1/1 >>  Dose adjustments this admission: N/A  Microbiology results: 12/28 BCx: NGTD 12/20 MRSA PCR: (+)  Thank you for allowing pharmacy to be a part of this patient's care.  1/21, PharmD, BCPS Clinical Pharmacist 07/19/2020 9:25 AM

## 2020-07-19 NOTE — Progress Notes (Signed)
NAME:  Antonio [name], MRN:  [id number], DOB:  02/03/1946, LOS: 3 ADMISSION DATE:  07/03/2020, CONSULTATION DATE:  07/16/2020 REFERRING MD:  Dr. Charna Archer, CHIEF COMPLAINT:  Cardiac arrest  Brief History:  [name] is a 75 year old male presenting with acute hypoxic respiratory failure, subsequently PEA arrest with ROSC after 15 minutes, decompensated heart failure.   History of Present Illness:  [name] is a 75 year old male with history of COPD, chronic diastolic heart failure, hypertension, morbid obesity, alcohol abuse, tobacco abuse and poor healthcare follow-up who was brought to the ED via EMS after his roommate called his son to tell him the patient was having difficulty breathing.  The son asked the roommate to called 43 which he did.    EMS found the patient to be in respiratory distress and was placed on BiPAP.  Just upon arrival to ED, the patient reportedly lost consciousness, and was found to be in PEA arrest.  He received about 4 rounds of epinephrine and achieved ROSC after approximately 15 minutes.  He was intubated.  After achieving ROSC, the patient was then having runs of non-sustained Vtach for which he was started on Amiodarone drip.  He is being sedated with propofol and has stable blood pressures without need for vasopressors.  He was noted to have pink frothy secretions after intubation, and CXR showed increased interstitial markings suggestive of pulmonary edema, and bilateral patchy airspace opacities.  Viral panel was negative for COVID and influenza A/B.  hsTrop was initially 162, then 297, BNP of 562 and lactic acid was 5.4.   The patient's 2 sons -- Tramell Piechota and Coco were bedside, and has elected to make the patient DNR.    Of note, the patient was admitted here 07/27/19 to 08/20/19 for acute hypoxic and hypercapnic respiratory failure related to COPD and heart failure exacerbation for which he required intubation for nearly 2 weeks.    According the the sons, the patient had not seen a PCP since his discharge from the hospital in Feb 2021, and has not been on any medications.  At baseline, he is fully functional, drives, does grocery shopping, etc, but has exercise limitations.  He lives alone but rents a room.  Per the sons, he sometimes drinks several cans of beer in day but is not a daily drinker. One of his sons says he spoke to him earlier in the morning, and he said he was feeling great.   Past Medical History:  COPD ETOH abuse Smoker  Significant Hospital Events:  12/30: on WUA, no purposeful movement and does not track, very asynchronous with vent and hypoxic; CT Head obtained showing:1. Patchy areas of hypoattenuation in the right ACA and PCA territories compatible with acute/subacute infarcts. Local mass effect present with some effacement of the sulci but no midline Shift. 2. No acute hemorrhage. 3. Remote left temporal lobe infarct. 4. Atherosclerosis. 12/31: Off vasopressors.  Amiodarone on hold due to Bradycardia.  Plan for MRI Brain.   Consults:  PCCM Cardiology Neurology  Palliative Care  Procedures:  N/A  Significant Diagnostic Tests:  12/30: EEG>>This study is suggestive of severe diffuse encephalopathy, nonspecific etiology but could be related to sedation, anoxic/hypoxic brain injury. No seizures or epileptiform discharges were seen throughout the recording. 12/30: CT Head>>1. Patchy areas of hypoattenuation in the right ACA and PCA territories compatible with acute/subacute infarcts. Local mass effect present with some effacement of the sulci but no midline shift. 2. No acute hemorrhage.  3. Remote left temporal lobe infarct. 4. Atherosclerosis. 12/31: MRI Brain>> multiple areas of acute cortical infarct in the right MCA territory involving right frontal parietal cortex with mild associated hemorrhage.  Acute infarct on the right occipital lobe with right PCA territory.  Small chronic  infarct on the left lateral temporal lobe.   Micro Data:  12/28: SARS-CoV-2 PCR>>negative 12/28: Influenza PCR>>negative 12/28: Blood culture>> 12/29: Urine>> 12/30: Tracheal aspirate>>  Antimicrobials:  Zosyn 12/29>>  Interim History / Subjective:  No acute events reported overnight Unresponsive when sedation is taken off Afebrile, off vasopressors     Objective   Blood pressure (!) 141/41, pulse 61, temperature 98.78 F (37.1 C), temperature source Esophageal, resp. rate (!) 24, height _0  (1.676 m), weight 117.2 kg, SpO2 95 %.    Vent Mode: PRVC FiO2 (%):  [35 %-40 %] 35 % Set Rate:  [24 bmp] 24 bmp Vt Set:  [450 mL] 450 mL PEEP:  [5 cmH20] 5 cmH20 Plateau Pressure:  [23 cmH20] 23 cmH20   Intake/Output Summary (Last 24 hours) at 07/19/2020 0951 Last data filed at 07/19/2020 0600 Gross per 24 hour  Intake 720.57 ml  Output 975 ml  Net -254.43 ml   Filed Weights   07/16/20 0200 07/19/20 0422  Weight: 114.8 kg 117.2 kg    Examination: General: Critically ill appearing male, laying in bed, intubated and minimally sedated, synchronous with vent  HEENT: Atraumatic, normocephalic, neck supple, no JVD, ETT in place Lungs: Clear breath sounds bilaterally, no wheezing or rales, synchronous with vent, even Cardiovascular: Bradycardia, regular rhythm, s1s2, no M/R/G Abdomen: Obese, soft, nontender, nondistended, no guarding or rebound tenderness, BS+ x4 Extremities: No deformities, no clubbing, 1+ edema bilateral LE's Neuro: Sedated, withdraws from pain, upward gaze, pupils PERRL (sluggish) Skin: Warm and dry.  No obvious rashes, lesions, or ulcerations  Resolved Hospital Problem list   N/A  Assessment & Plan:   Acute hypoxic respiratory failure Acute exacerbation of diastolic heart failure/ pulmonary edema COPD exacerbation Aspiration pneumonia ruled out  -Continue vent support for now -Wean FiO2 and PEEP as tolerated to maintain O2 sats greater than  92% -Follow intermittent chest x-ray and ABG as needed -VAP bundle implemented -Spontaneous breathing trials when respiratory parameters met and mental status permits -Diuresis as blood pressure and renal function permits, currently unable to diurese due to AKI -Monitor fever curve -Trend WBC's and Procalcitonin -Tracheal aspirate strep pneumo, few staph aureus   PEA arrest Non-sustained Vtach Elevated troponin, demand ischemia versus NSTEMI PEA arrest likely result of acute respiratory failure; no strong evidence of ACS.  Runs of non-sustained Vtach  -Continuous cardiac monitoring -Maintain MAP >65 -Vasopressors as needed to maintain MAP goal>> currently weaned off -Troponin peaked at 1,455 -Heparin gtt discontinued due to new finding of Ischemic CVA's as per discussion with ICU Attending and Neurology -Cardiology following, appreciate input -Aspirin and statin -Amiodarone currently on hold due to Bradycardia -Unable to diurese due to AKI -Echocardiogram/troponin with LVEF 55 to 97%, diastolic function unable to be evaluated, RV systolic function normal   AKI Cr 1.62, baseline 1.49 from 08/2019. 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 Watershed in the setting of PEA arrest) Sedation needs in setting of mechanical ventilation -Minimal sedation mostly for vent synchrony -Maintain RASS of  0 to -1 -Propofol and fentanyl as needed to maintain RASS goal -Daily wake-up assessment: Has failed -CT head on 07/17/20 with patchy acute/subacute infarcts to the right ACA and PCA territories -Neurology consulted, appreciate input -Discussed with Dr. Curly [place] -MRI brain findings as above -EEG shows severe  diffuse encephalopathy -Continue statin and aspirin -Echo previously obtained 07/16/20    Pt is critically ill with multiorgan failure.  Prognosis is guarded.  High risk for cardiac arrest and death. Palliative Care consulted.    Discussed with the patient's son at bedside.  Patient's son dates that family has gathered and that they will will want to transition the patient to comfort care and will plan for one-way extubation in the morning.  Orders written accordingly.     Best practice (evaluated daily)  Diet: NPO Pain/Anxiety/Delirium protocol (if indicated): Propofol, Fentanyl VAP protocol (if indicated): Yes DVT prophylaxis: SCD's, Heparin SQ GI prophylaxis: Pepcid Glucose control: N/A Mobility: Bedrest Disposition:ICU  Goals of Care:  Last date of multidisciplinary goals of care discussion: 07/19/2020  Family and staff present:  Updated pt's son Shoichi Mielke at bedside Summary of discussion: Family wishes to transition to comfort care according to patient's prior wishes Follow up goals of care discussion due: 07/19/2020 Code Status: DNR  Labs   CBC: Recent Labs  Lab 07/10/2020 2113 07/16/20 0510 07/17/20 0702 07/18/20 0510 07/19/20 0512  WBC 21.7* 14.3* 15.1* 12.4* 7.7  NEUTROABS 11.1* 12.5*  --   --   --   HGB 14.1 14.4 12.4* 13.6 11.5*  HCT 44.8 42.9 38.6* 41.5 35.2*  MCV 102.3* 97.9 100.8* 99.0 98.9  PLT 323 284 288 232 409    Basic Metabolic Panel: Recent Labs  Lab 07/16/20 0510 07/16/20 1418 07/17/20 0702 07/17/20 1721 07/18/20 0510 07/19/20 0512  NA 137 137 136  --  137 140  K 4.4 4.9 4.0  --  3.7 3.4*  CL 103 104 102  --  103 108  CO2 _0 --  24 26  GLUCOSE 143* 120* 98  --  107* 131*  BUN 21 26* 36*  --  45* 40*  CREATININE 1.62* 2.03* 2.40*  --  1.69* 1.44*  CALCIUM 8.0* 7.9* 7.5*  --  8.1* 8.1*  MG 2.0 2.0 2.1 2.2 2.4 2.3  PHOS 3.5 5.0* 3.7 3.5 3.0 2.6   GFR: Estimated Creatinine Clearance: 53.4 mL/min (A) (by C-G formula based  on SCr of 1.44 mg/dL (H)). Recent Labs  Lab 06/28/2020 2113 06/29/2020 2246 07/16/20 0510 07/16/20 0511 07/16/20 0634 07/16/20 1224 07/17/20 0702 07/18/20 0510 07/19/20 0512  PROCALCITON  --   --   --   --   --  5.62 2.71 1.79 1.07  WBC 21.7*  --  14.3*  --   --   --  15.1* 12.4* 7.7  LATICACIDVEN 5.4* 2.1*  --  1.1 1.1  --   --   --   --     Liver Function Tests: Recent Labs  Lab 06/27/2020 2113 07/16/20 0510  AST 55* 47*  ALT 35 38  ALKPHOS 75 60  BILITOT 0.8 0.6  PROT 7.0 6.8  ALBUMIN 3.4* 3.4*   No results for input(s): LIPASE, AMYLASE in the last 168 hours. No results for input(s): AMMONIA in the last 168 hours.  ABG    Component Value Date/Time   PHART 7.34 (L) 07/16/2020 0049   PCO2ART 45 07/16/2020 0049   PO2ART 167 (H) 07/16/2020 0049   HCO3 24.3 07/16/2020 0049  ACIDBASEDEF 1.8 07/16/2020 0049   O2SAT 99.4 07/16/2020 0049     Coagulation Profile: Recent Labs  Lab 07/16/20 0634  INR 1.2    Cardiac Enzymes: No results for input(s): CKTOTAL, CKMB, CKMBINDEX, TROPONINI in the last 168 hours.  HbA1C: Hgb A1c MFr Bld  Date/Time Value Ref Range Status  08/01/2019 04:14 PM 4.9 4.8 - 5.6 % Final    Comment:    (NOTE) Pre diabetes:          5.7%-6.4% Diabetes:              >6.4% Glycemic control for   <7.0% adults with diabetes     CBG: Recent Labs  Lab 07/18/20 1615 07/18/20 1944 07/18/20 2308 07/19/20 0339 07/19/20 0744  GLUCAP 169* 94 109* 92 90    Review of Systems:   Unable to assess due to critical illness, intubation, and sedation  Past Medical History:  He,  has a past medical history of COPD (chronic obstructive pulmonary disease) ([place]), ETOH abuse, and Smoker.   Surgical History:  No past surgical history on file.   Social History:   reports that he has been smoking cigarettes. He has a 27.50 pack-year smoking history. He has never used smokeless tobacco. He reports that he does not drink alcohol and does not use drugs.    Family History:  His family history is not on file.   Allergies No Known Allergies   Home Medications  Prior to Admission medications   Not on File     Critical care time: 40 minutes     C. Derrill Kay, MD [facility]   *This note was dictated using voice recognition software/Dragon.  Despite best efforts to proofread, errors can occur which can change the meaning.  Any change was purely unintentional.

## 2020-07-19 NOTE — Progress Notes (Signed)
Progress Note  Patient Name: Antonio [name] Date of Encounter: 07/19/2020  [facility] Cardiologist: [facility]  Subjective   Intubated sedated MRI concerning for stroke Patchy areas of hypoattenuation in the right ACA and PCA territories compatible with acute/subacute infarcts Neurology following -Severe carotid disease bilaterally with stenosis, intravascular disease  Telemetry with normal sinus rhythm Normal ejection fraction EEG severe diffuse encephalopathy etiology unclear  Nurses reporting son is not certain that patient would want such aggressive measures  Inpatient Medications    Scheduled Meds: . aspirin  81 mg Per Tube Daily  . atorvastatin  40 mg Per Tube Daily  . chlorhexidine gluconate (MEDLINE KIT)  15 mL Mouth Rinse BID  . Chlorhexidine Gluconate Cloth  6 each Topical Daily  . docusate  100 mg Per Tube BID  . enoxaparin (LOVENOX) injection  0.5 mg/kg Subcutaneous Q24H  . famotidine  20 mg Per Tube BID  . feeding supplement (PROSource TF)  90 mL Per Tube TID  . folic acid  2 mg Oral Daily  . ipratropium-albuterol  3 mL Nebulization Q6H  . mouth rinse  15 mL Mouth Rinse 10 times per day  . metoprolol tartrate  12.5 mg Per Tube BID  . multivitamin with minerals  1 tablet Oral Daily  . mupirocin ointment  1 application Nasal BID  . thiamine injection  100 mg Intravenous Daily   Continuous Infusions: . sodium chloride 5 mL/hr at 07/19/20 0600  . ampicillin-sulbactam (UNASYN) IV 3 g (07/19/20 1123)  . feeding supplement (VITAL HIGH PROTEIN) 45 mL/hr at 07/19/20 0500  . fentaNYL infusion INTRAVENOUS 50 mcg/hr (07/19/20 0600)  . propofol (DIPRIVAN) infusion 20 mcg/kg/min (07/19/20 0810)   PRN Meds: acetaminophen, fentaNYL, fentaNYL (SUBLIMAZE) injection, fentaNYL (SUBLIMAZE) injection, polyethylene glycol   Vital Signs    Vitals:   07/19/20 1000 07/19/20 1030 07/19/20 1043 07/19/20 1100  BP: (!) 191/61 (!) 165/42  (!) 158/47  Pulse: 85 80 78 76  Resp:  19 (!) 24 (!) 26 (!) 24  Temp: 99.5 F (37.5 C) 99.68 F (37.6 C)  99.5 F (37.5 C)  TempSrc:      SpO2: 97% 94% 93% 91%  Weight:      Height:        Intake/Output Summary (Last 24 hours) at 07/19/2020 1204 Last data filed at 07/19/2020 0600 Gross per 24 hour  Intake 720.57 ml  Output 975 ml  Net -254.43 ml   Last 3 Weights 07/19/2020 07/16/2020 08/26/2019  Weight (lbs) 258 lb 6.1 oz 253 lb 1.4 oz 229 lb 11.5 oz  Weight (kg) 117.2 kg 114.8 kg 104.2 kg      Telemetry    Normal sinus rhythm- Personally Reviewed  ECG    - Personally Reviewed  Physical Exam   GEN:  Intubated sedated Neck:  Unable to estimate JVD Cardiac: RRR, no murmurs, rubs, or gallops.  Respiratory:  Clear, scattered Rales GI: Soft, non-distended  MS: No edema; No deformity. Neuro:   Unable to test Psych: Unable to test  Labs    High Sensitivity Troponin:   Recent Labs  Lab 07/14/2020 2113 06/27/2020 2252 07/16/20 0510 07/16/20 0634  TROPONINIHS 162* 297* 1,455* 1,380*      Chemistry Recent Labs  Lab 06/24/2020 2113 07/16/20 0510 07/16/20 1418 07/17/20 0702 07/18/20 0510 07/19/20 0512  NA 139 137   < > 136 137 140  K 3.9 4.4   < > 4.0 3.7 3.4*  CL 104 103   < > 102 103 108  CO2 22 25   < > $R'24 24 26  'sh$ GLUCOSE 312* 143*   < > 98 107* 131*  BUN 15 21   < > 36* 45* 40*  CREATININE 1.62* 1.62*   < > 2.40* 1.69* 1.44*  CALCIUM 8.0* 8.0*   < > 7.5* 8.1* 8.1*  PROT 7.0 6.8  --   --   --   --   ALBUMIN 3.4* 3.4*  --   --   --   --   AST 55* 47*  --   --   --   --   ALT 35 38  --   --   --   --   ALKPHOS 75 60  --   --   --   --   BILITOT 0.8 0.6  --   --   --   --   GFRNONAA 44* 44*   < > 27* 42* 51*  ANIONGAP 13 9   < > $R'10 10 6   'KL$ < > = values in this interval not displayed.     Hematology Recent Labs  Lab 07/17/20 0702 07/18/20 0510 07/19/20 0512  WBC 15.1* 12.4* 7.7  RBC 3.83* 4.19* 3.56*  HGB 12.4* 13.6 11.5*  HCT 38.6* 41.5 35.2*  MCV 100.8* 99.0 98.9  MCH 32.4 32.5 32.3  MCHC  32.1 32.8 32.7  RDW 14.5 14.6 14.5  PLT 288 232 198    BNP Recent Labs  Lab 07/06/2020 2113 07/16/20 0510  BNP 562.8* 1,001.1*     DDimer No results for input(s): DDIMER in the last 168 hours.   Radiology    EEG  Result Date: 07/17/2020 Lora Havens, MD     07/17/2020 10:21 PM Patient Name: Antonio [name] MRN: [id number] Epilepsy Attending: Lora Havens Referring Physician/Provider: Darel Hong, NP Date: 07/17/2020 Duration: Patient history: 75yo M s/p cardiac arrest. EEG to evaluate for seizure. Level of alertness:  Comatose AEDs during EEG study: Propofol Technical aspects: This EEG study was done with scalp electrodes positioned according to the 10-20 International system of electrode placement. Electrical activity was acquired at a sampling rate of $Remov'500Hz'tJKcSb$  and reviewed with a high frequency filter of $RemoveB'70Hz'qiDpgzoM$  and a low frequency filter of $RemoveB'1Hz'FguGUEyH$ . EEG data were recorded continuously and digitally stored. Description: EEG showed continuous generalized predominantly 2-3 Hz delta as well as 5-$RemoveB'6Hz'uqzcrBzf$  theta slowing. EEG was not reactive to noxious stimuli.  Hyperventilation and photic stimulation were not performed.   ABNORMALITY -Continuous slow, generalized IMPRESSION: This study is suggestive of severe diffuse encephalopathy, nonspecific etiology but could be related to sedation, anoxic/hypoxic brain injury. No seizures or epileptiform discharges were seen throughout the recording. Lora Havens   CT HEAD WO CONTRAST  Result Date: 07/17/2020 CLINICAL DATA:  Mental status change. Patient intubated. Status post cardiac arrest. EXAM: CT HEAD WITHOUT CONTRAST TECHNIQUE: Contiguous axial images were obtained from the base of the skull through the vertex without intravenous contrast. COMPARISON:  CT head without contrast 08/21/2019 FINDINGS: Brain: Patchy areas of hypoattenuation are present in the right ACA and PCA territories. There is focal loss cortical hyperintensity sulcal effacement.  Right ACA territory is spared. Mild white matter changes are present bilaterally otherwise. A remote left temporal lobe infarct is stable. Left hemisphere is otherwise unremarkable. Basal ganglia are within normal limits bilaterally. Brainstem and cerebellum are normal. Left para falcine meningioma is stable. Vascular: Atherosclerotic calcifications are present within the cavernous internal carotid arteries bilaterally. No hyperdense vessel is present. Skull: Insert  normal skull No significant extracranial soft tissue lesion is present. Sinuses/Orbits: Patient is intubated. Fluid is present nasopharynx. Bilateral maxillary antrostomies are present. Diffuse mucosal thickening is present maxillary sinuses bilaterally without evidence of chronic disease. Scattered ethmoid opacification present well. The mastoid air cells are clear. Bilateral exophthalmos is chronic. Globes and orbits are otherwise within normal limits. IMPRESSION: 1. Patchy areas of hypoattenuation in the right ACA and PCA territories compatible with acute/subacute infarcts. Local mass effect present with some effacement of the sulci but no midline shift. 2. No acute hemorrhage. 3. Remote left temporal lobe infarct. 4. Atherosclerosis. These results were called by telephone at the time of interpretation on 07/17/2020 at 2:39 pm to provider Darel Hong, NP , who verbally acknowledged these results. Electronically Signed   By: San Morelle M.D.   On: 07/17/2020 14:39   MR ANGIO HEAD WO CONTRAST  Result Date: 07/18/2020 CLINICAL DATA:  Stroke follow-up EXAM: MRI HEAD WITHOUT CONTRAST MRA HEAD WITHOUT CONTRAST TECHNIQUE: Multiplanar, multiecho pulse sequences of the brain and surrounding structures were obtained without intravenous contrast. Angiographic images of the head were obtained using MRA technique without contrast. COMPARISON:  CT head 07/17/2020 FINDINGS: MRI HEAD FINDINGS Brain: Acute infarct right occipital pole. Small acute  infarct right lateral temporal lobe. Scattered cortical infarcts involving the right frontal parietal lobe over the convexity in the right MCA territory. Small amount of associated hemorrhage in the right frontal convexity infarct and in the right anterior frontal infarct. Small acute infarct left frontal cortex over the convexity. Small acute infarct left head of caudate. Ventricle size normal. Chronic infarct left lateral temporal lobe. Negative for mass lesion. Vascular: Normal arterial flow voids Skull and upper cervical spine: No focal skeletal lesion. Sinuses/Orbits: Extensive mucosal edema paranasal sinuses. Negative orbit Other: None MRA HEAD FINDINGS Right vertebral artery dominant. Both vertebral arteries patent to the basilar without stenosis. PICA patent bilaterally. Basilar patent. Superior cerebellar and posterior cerebral arteries patent bilaterally. Fetal origin right posterior cerebral artery. Irregularity and moderate to severe stenosis in the cavernous carotid bilaterally. No occlusion. Anterior and middle cerebral arteries patent bilaterally. Mild stenosis left A1 and left M1 segments. Negative for large vessel occlusion. Negative for aneurysm. IMPRESSION: 1. Multiple areas of acute cortical infarct in the right MCA territory involving right frontal parietal cortex with mild associated hemorrhage. Acute infarct in the right occipital lobe in the right PCA territory. There is fetal origin of the right posterior cerebral artery which could account for this infarct. Possible emboli. In addition, small areas of acute infarct in the left frontal cortex over the convexity and head of caudate on the left. 2. Small chronic infarct left lateral temporal lobe. 3. 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 No intracranial large vessel occlusion. Electronically Signed   By: Franchot Gallo M.D.   On: 07/18/2020 16:24   MR ANGIO NECK W WO CONTRAST  Result Date:  07/18/2020 CLINICAL DATA:  Stroke EXAM: MRA NECK WITHOUT AND WITH CONTRAST TECHNIQUE: Multiplanar and multiecho pulse sequences of the neck were obtained without and with intravenous contrast. Angiographic images of the neck were obtained using MRA technique without and with intravenous contrast. CONTRAST:  12mL GADAVIST GADOBUTROL 1 MMOL/ML IV SOLN COMPARISON:  None. FINDINGS: Antegrade flow in the carotid and vertebral arteries bilaterally. Carotid bifurcation patent bilaterally without stenosis. There is severe stenosis in the cavernous carotid bilaterally due to atherosclerotic disease Right vertebral artery dominant and widely patent to the basilar. Non dominant right vertebral  artery is patent to the basilar with mild stenosis distally. This vessel is best seen on the source images. 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 Both vertebral arteries patent to the basilar. Electronically Signed   By: Franchot Gallo M.D.   On: 07/18/2020 16:26   MR BRAIN WO CONTRAST  Result Date: 07/18/2020 CLINICAL DATA:  Stroke follow-up EXAM: MRI HEAD WITHOUT CONTRAST MRA HEAD WITHOUT CONTRAST TECHNIQUE: Multiplanar, multiecho pulse sequences of the brain and surrounding structures were obtained without intravenous contrast. Angiographic images of the head were obtained using MRA technique without contrast. COMPARISON:  CT head 07/17/2020 FINDINGS: MRI HEAD FINDINGS Brain: Acute infarct right occipital pole. Small acute infarct right lateral temporal lobe. Scattered cortical infarcts involving the right frontal parietal lobe over the convexity in the right MCA territory. Small amount of associated hemorrhage in the right frontal convexity infarct and in the right anterior frontal infarct. Small acute infarct left frontal cortex over the convexity. Small acute infarct left head of caudate. Ventricle size normal. Chronic infarct left lateral  temporal lobe. Negative for mass lesion. Vascular: Normal arterial flow voids Skull and upper cervical spine: No focal skeletal lesion. Sinuses/Orbits: Extensive mucosal edema paranasal sinuses. Negative orbit Other: None MRA HEAD FINDINGS Right vertebral artery dominant. Both vertebral arteries patent to the basilar without stenosis. PICA patent bilaterally. Basilar patent. Superior cerebellar and posterior cerebral arteries patent bilaterally. Fetal origin right posterior cerebral artery. Irregularity and moderate to severe stenosis in the cavernous carotid bilaterally. No occlusion. Anterior and middle cerebral arteries patent bilaterally. Mild stenosis left A1 and left M1 segments. Negative for large vessel occlusion. Negative for aneurysm. IMPRESSION: 1. Multiple areas of acute cortical infarct in the right MCA territory involving right frontal parietal cortex with mild associated hemorrhage. Acute infarct in the right occipital lobe in the right PCA territory. There is fetal origin of the right posterior cerebral artery which could account for this infarct. Possible emboli. In addition, small areas of acute infarct in the left frontal cortex over the convexity and head of caudate on the left. 2. Small chronic infarct left lateral temporal lobe. 3. 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 No intracranial large vessel occlusion. Electronically Signed   By: Franchot Gallo M.D.   On: 07/18/2020 16:24   DG Chest Port 1 View  Result Date: 07/18/2020 CLINICAL DATA:  Acute respiratory failure, hypoxia EXAM: PORTABLE CHEST 1 VIEW COMPARISON:  None. FINDINGS: Endotracheal tube and nasogastric tube are unchanged. Moderate left pleural effusion again identified with associated retrocardiac opacification. Small right pleural effusion suspected with opacification of the right lung base. Mild perihilar interstitial pulmonary edema is present, possibly cardiogenic in nature.  Mild cardiomegaly is stable. No pneumothorax. IMPRESSION: Mild perihilar pulmonary edema and bilateral pleural effusions, right greater than left in keeping with mild cardiogenic failure, unchanged. Stable support tubes. Electronically Signed   By: Fidela Salisbury MD   On: 07/18/2020 05:21   DG Chest Port 1 View  Result Date: 07/17/2020 CLINICAL DATA:  Intubated patient status post acute hypoxic respiratory failure 06/20/2020. EXAM: PORTABLE CHEST 1 VIEW COMPARISON:  Single-view of the chest 07/16/2020. FINDINGS: NG tube courses into the stomach and below the inferior margin of the film. Endotracheal tube tip is approximately 2 cm above the carina. Bibasilar airspace disease and effusions persist. There is cardiomegaly. No pneumothorax. IMPRESSION: No change in bilateral effusions and airspace disease. Electronically Signed   By: Inge Rise M.D.  On: 07/17/2020 12:38    Cardiac Studies   Echocardiogram 1. Left ventricular ejection fraction, by estimation, is 55 to 60%. The  left ventricle has normal function. Left ventricular endocardial border  not optimally defined to evaluate regional wall motion. Left ventricular  diastolic function could not be  evaluated.  2. Right ventricular systolic function is normal. The right ventricular  size is normal.  3. The mitral valve is grossly normal. No evidence of mitral valve  regurgitation.  4. The aortic valve was not well visualized. Aortic valve regurgitation  is mild. Mild to moderate aortic valve stenosis.   Patient Profile     Mr. Sabic is a 75 year old gentleman with COPD, prior alcohol smoking history, medication noncompliance, hypertension, morbid obesity, January 2021 with acute hypoxic and hypercapnic respiratory failure requiring 2-week intubation, presenting with worsening shortness of breath, respiratory distress   Assessment & Plan    Non-STEMI  Contributing factors acute on chronic respiratory failure with hypoxia   Underlying COPD , sleep apnea, morbid obesity Echocardiogram with no focal wall motion abnormality  -Heparin infusion held for concern of stroke Following mental status/encephalopathy, -No plan for cardiac catheterization until cognition improves, patient and her family indicate they want aggressive care  Stroke Diffuse PAD, bilateral carotid stenoses, intravascular disease Very high risk for additional embolic  Agree with dual antiplatelet aspirin Plavix  Nonsustained VT  Arrhythmia has resolved,  EF normal on echo  Amiodarone infusion held   Acute renal failure  Likely component of ATN Numbers continue to improve Could give Lasix IV if indicated   PEA arrest   acute on chronic hypoxic and hypercarbic respiratory failure  Similar events 1 year ago on ventilator 2 weeks  exacerbated by COPD, pulmonary edema  Consider Lasix tomorrow if cognition improves    Total encounter time more than 35 minutes  Greater than 50% was spent in counseling and coordination of care with the patient   For questions or updates, please contact [facility] Please consult [email or web address] for contact info under        Signed, Ida Rogue, MD  07/19/2020, 12:04 PM

## 2020-07-19 NOTE — Progress Notes (Signed)
Neurology Progress Note  Patient ID: Antonio [name] is a 75 y.o. male with a past medical history significant for COPD, chronic diastolic heart failure, hypertension, morbid obesity, ongoing tobacco abuse, alcohol abuse, limited access to healthcare, who presented with PEA arrest on 07/04/2020   Initially consulted for: Strokes on HCT  Major interval events:  - MRI completed   Subjective: - Intubated, minimally interactive  Exam: Vitals:   07/19/20 0500 07/19/20 0600  BP: (!) 154/44 (!) 141/41  Pulse: 64 61  Resp: (!) 24 (!) 24  Temp: 98.42 F (36.9 C) 98.78 F (37.1 C)  SpO2: 96% 95%   Constitutional: Chronically ill, obese  Psych: minimally interactive Eyes: Mild scleral edma HENT: ETT in place MSK: No obvious joint deformities  Cardiovascular: Normal rate and regular rhythm.  Respiratory: Comfortable on vent  GI: Soft.  No distension.  Skin: Edematous throughout,    Neuro: 10:50 AM after sedation paused x 40 min Mental Status: Eyes initially closed, slightly opens eye to voice Cranial Nerves: II: Pupils are equal, round, and reactive to light. 3 -> 2 mm III,IV, VI: VOR better to the right than the left, cannot clearly get to cross midline. Gaze dysconjugate intermittently, but more conjugated when stimulated V: Facial sensation is symmetric to eyelash brush VII: Facial movement is symmetric though limited eval due to ETT VIII: hearing is intact to voice based on slight eye opening XII: tongue is midline; protrudes spontaneously with noxious stim Motor: Tone is mildly increased throughout. Slight movement of bilateral UE with noxious stim. Distal stimuli with brisk triple flexion in the LE however, internally rotates feet with noxious stimulation at the thighs, Moves the right LE slightly with stimulation of the LLE. Overall slightly more brisk in the right than the left Sensory: Slightly more responsive to stimuli on the right than the left  Deep Tendon  Reflexes: Difficult to elicit secondary to edema Plantars: Toes are briskly upgoing bilaterally   Pertinent Labs:  Lab Results  Component Value Date    CHOL 131 08/17/2019    HDL 21 (L) 08/17/2019    LDLCALC 89 08/17/2019    TRIG 144 07/18/2020    CHOLHDL 6.2 08/17/2019      Recent Labs       Lab Results  Component Value Date    HGBA1C 4.9 08/01/2019     Lab Results  Component Value Date   TSH 2.152 08/17/2019     MRI brain personally reviewed, embolic pattern of strokes most predominantly in the right MCA territory, but also in the right PCA with punctate lesions in the left MCA territory, as well as one punctate lesion in the left cerebellum.  Petechial hemorrhagic conversion of the left MCA territory stroke.  Chronic right temporal stroke.  MRA head and neck personally reviewed, no significant stenoses of the extracranial carotid arteries, but bilateral severe stenosis of the intracranial carotids   Impression: 75 year old man w/ PMHx as above p/w cardiac arrest  Recommendations:  # Multifocal stroke, likely cardioembolic versus atheroembolic, cannot rule out some component of watershed - Stroke labs TSH, ESR, RPR, HgbA1c, fasting lipid panel - MRI brain with an overall embolic pattern of stroke predominantly in the right MCA territory, with involvement of the right PCA, left MCA, and cerebellum as well - MRA of the brain without contrast and MRA neck w/wo notable for bilateral intracranial carotid stenosis - Frequent neuro checks every 4 hours - Echocardiogram TTE technically limited, mild to moderate aortic valve stenosis,  normal biatrial size and right ventricular function, left ventricular border not optimally defined despite Definity contrast  - Aspirin 81 mg daily -Considered Plavix 300 mg load with 75 mg daily for 21 - 90 day course pending goals of care discussion with family; given focus on comfort care will not order this medication - Neurology will sign off  at this time  Discussed with sons Edd Arbour and Mortimer Fries the patient's neurological status and uncertain prognosis, with likely protracted recovery time given his comorbidities.  They confirmed that the had discussed earlier with Dr. Patsey Berthold and the patient's wishes would be to transition to comfort care.  40 minutes were spent in the critical care of this patient today  [place] [phone]

## 2020-07-19 NOTE — Consult Note (Signed)
PHARMACY CONSULT NOTE - FOLLOW UP  Pharmacy Consult for Electrolyte Monitoring and Replacement   Recent Labs: Potassium (mmol/L)  Date Value  07/19/2020 3.4 (L)   Magnesium (mg/dL)  Date Value  01/00/7121 2.3   Calcium (mg/dL)  Date Value  97/58/8325 8.1 (L)   Albumin (g/dL)  Date Value  49/82/6415 3.4 (L)   Phosphorus (mg/dL)  Date Value  83/03/4075 2.6   Sodium (mmol/L)  Date Value  07/19/2020 140     Assessment: Antonio [name] is a 75 y.o. male with medical history including COPD, dCHF, HTN, morbid obesity, alcohol abuse, tobacco abuse admitted on 04-Aug-2020 with acute hypoxic respiratory failure, PEA arrest, NSVT, type II NSTEMI.    Goal of Therapy:  Electrolytes wnl's  Plan:  K 3.4 - repleted with KCl per tube - pharmacy will follow  Albina Billet ,PharmD Clinical Pharmacist 07/19/2020 9:28 AM

## 2020-07-19 NOTE — Progress Notes (Signed)
[facility]  ROUNDING NOTE   Subjective:  Patient remains critically ill. Still on the ventilator. Renal function slightly improved with creatinine down to 1.4. Urine output was 975 cc over the preceding 24 hours.   Objective:  Vital signs in last 24 hours:  Temp:  [95.9 F (35.5 C)-99.68 F (37.6 C)] 99.5 F (37.5 C) (01/01 1100) Pulse Rate:  [58-85] 76 (01/01 1100) Resp:  [19-26] 24 (01/01 1100) BP: (104-191)/(33-61) 158/47 (01/01 1100) SpO2:  [91 %-99 %] 91 % (01/01 1100) FiO2 (%):  [35 %-40 %] 35 % (01/01 1043) Weight:  [117.2 kg] 117.2 kg (01/01 0422)  Weight change:  Filed Weights   07/16/20 0200 07/19/20 0422  Weight: 114.8 kg 117.2 kg    Intake/Output: I/O last 3 completed shifts: In: 2131.2 [I.V.:913.8; NG/GT:945; IV Piggyback:272.5] Out: 1157 [WIOMB:5597]   Intake/Output this shift:  No intake/output data recorded.  Physical Exam: General:  Critically ill-appearing  Head:  Endotracheal tube in place  Eyes:  Anicteric  Neck:  Supple  Lungs:   Clear to auscultation, vent assisted  Heart:  S1S2 no rubs  Abdomen:   Soft, nontender, bowel sounds present  Extremities:  1+ peripheral edema.  Neurologic:  Intubated, sedated  Skin:  No acute rash  Access:  No hemodialysis access    Basic Metabolic Panel: Recent Labs  Lab 07/16/20 0510 07/16/20 1418 07/17/20 0702 07/17/20 1721 07/18/20 0510 07/19/20 0512  NA 137 137 136  --  137 140  K 4.4 4.9 4.0  --  3.7 3.4*  CL 103 104 102  --  103 108  CO2 _0 --  24 26  GLUCOSE 143* 120* 98  --  107* 131*  BUN 21 26* 36*  --  45* 40*  CREATININE 1.62* 2.03* 2.40*  --  1.69* 1.44*  CALCIUM 8.0* 7.9* 7.5*  --  8.1* 8.1*  MG 2.0 2.0 2.1 2.2 2.4 2.3  PHOS 3.5 5.0* 3.7 3.5 3.0 2.6    Liver Function Tests: Recent Labs  Lab 07/13/2020 2113 07/16/20 0510  AST 55* 47*  ALT 35 38  ALKPHOS 75 60  BILITOT 0.8 0.6  PROT 7.0 6.8  ALBUMIN 3.4* 3.4*   No results for input(s): LIPASE, AMYLASE  in the last 168 hours. No results for input(s): AMMONIA in the last 168 hours.  CBC: Recent Labs  Lab 07/17/2020 2113 07/16/20 0510 07/17/20 0702 07/18/20 0510 07/19/20 0512  WBC 21.7* 14.3* 15.1* 12.4* 7.7  NEUTROABS 11.1* 12.5*  --   --   --   HGB 14.1 14.4 12.4* 13.6 11.5*  HCT 44.8 42.9 38.6* 41.5 35.2*  MCV 102.3* 97.9 100.8* 99.0 98.9  PLT 323 284 288 232 198    Cardiac Enzymes: No results for input(s): CKTOTAL, CKMB, CKMBINDEX, TROPONINI in the last 168 hours.  BNP: Invalid input(s): POCBNP  CBG: Recent Labs  Lab 07/18/20 1944 07/18/20 2308 07/19/20 0339 07/19/20 0744 07/19/20 1142  GLUCAP 94 109* 92 90 98    Microbiology: Results for orders placed or performed during the hospital encounter of 06/19/2020  Resp Panel by RT-PCR (Flu A&B, Covid) Nasopharyngeal Swab     Status: None   Collection Time: 07/16/2020  9:13 PM   Specimen: Nasopharyngeal Swab; Nasopharyngeal(NP) swabs in vial transport medium  Result Value Ref Range Status   SARS Coronavirus 2 by RT PCR NEGATIVE NEGATIVE Final    Comment: (NOTE) SARS-CoV-2 target nucleic acids are NOT DETECTED.  The SARS-CoV-2 RNA is generally detectable  in upper respiratory specimens during the acute phase of infection. The lowest concentration of SARS-CoV-2 viral copies this assay can detect is 138 copies/mL. A negative result does not preclude SARS-Cov-2 infection and should not be used as the sole basis for treatment or other patient management decisions. A negative result may occur with  improper specimen collection/handling, submission of specimen other than nasopharyngeal swab, presence of viral mutation(s) within the areas targeted by this assay, and inadequate number of viral copies(<138 copies/mL). A negative result must be combined with clinical observations, patient history, and epidemiological information. The expected result is Negative.  Fact Sheet for Patients:   EntrepreneurPulse.com.au  Fact Sheet for Healthcare Providers:  IncredibleEmployment.be  This test is no t yet approved or cleared by the [place] FDA and  has been authorized for detection and/or diagnosis of SARS-CoV-2 by FDA under an Emergency Use Authorization (EUA). This EUA will remain  in effect (meaning this test can be used) for the duration of the COVID-19 declaration under Section 564(b)(1) of the Act, 21 U.S.C.section 360bbb-3(b)(1), unless the authorization is terminated  or revoked sooner.       Influenza A by PCR NEGATIVE NEGATIVE Final   Influenza B by PCR NEGATIVE NEGATIVE Final    Comment: (NOTE) The Xpert Xpress SARS-CoV-2/FLU/RSV plus assay is intended as an aid in the diagnosis of influenza from Nasopharyngeal swab specimens and should not be used as a sole basis for treatment. Nasal washings and aspirates are unacceptable for Xpert Xpress SARS-CoV-2/FLU/RSV testing.  Fact Sheet for Patients: EntrepreneurPulse.com.au  Fact Sheet for Healthcare Providers: IncredibleEmployment.be  This test is not yet approved or cleared by the [place] FDA and has been authorized for detection and/or diagnosis of SARS-CoV-2 by FDA under an Emergency Use Authorization (EUA). This EUA will remain in effect (meaning this test can be used) for the duration of the COVID-19 declaration under Section 564(b)(1) of the Act, 21 U.S.C. section 360bbb-3(b)(1), unless the authorization is terminated or revoked.  Performed at [facility], [address] [place], [place] [address]   Culture, blood (routine x 2)     Status: None (Preliminary result)   Collection Time: 06/18/2020  9:18 PM   Specimen: BLOOD  Result Value Ref Range Status   Specimen Description BLOOD LEFT ANTECUBITAL  Final   Special Requests   Final    BOTTLES DRAWN AEROBIC AND ANAEROBIC Blood Culture results may not be optimal  due to an inadequate volume of blood received in culture bottles   Culture   Final    NO GROWTH 4 DAYS Performed at [facility], [address], [place], [place] [address]    Report Status PENDING  Incomplete  MRSA PCR Screening     Status: Abnormal   Collection Time: 07/16/20  5:55 AM   Specimen: Nasopharyngeal  Result Value Ref Range Status   MRSA by PCR POSITIVE (A) NEGATIVE Final    Comment:        The GeneXpert MRSA Assay (FDA approved for NASAL specimens only), is one component of a comprehensive MRSA colonization surveillance program. It is not intended to diagnose MRSA infection nor to guide or monitor treatment for MRSA infections. RESULT CALLED TO, READ BACK BY AND VERIFIED WITHSallye Lat AT [id number] 07/16/20 SDR Performed at [facility], [address] [place], [place] [address]   Urine Culture     Status: None   Collection Time: 07/16/20  5:05 PM   Specimen: Urine, Random  Result Value  Ref Range Status   Specimen Description   Final    URINE, RANDOM Performed at [facility], [address] [place], [place] [address]    Special Requests   Final    NONE Performed at [facility], [address], [place], [place] [address]    Culture   Final    NO GROWTH Performed at [facility], [place] [address], [place], [place] [address]    Report Status 07/18/2020 FINAL  Final  Culture, respiratory     Status: None (Preliminary result)   Collection Time: 07/17/20  9:14 AM   Specimen: Tracheal Aspirate; Respiratory  Result Value Ref Range Status   Specimen Description   Final    TRACHEAL ASPIRATE Performed at [facility], [address], [place], [place] [address]    Special Requests   Final    NONE Performed at [facility], [place], [place] [address]    Gram Stain   Final    FEW WBC PRESENT,BOTH PMN AND MONONUCLEAR RARE GRAM POSITIVE COCCI RARE GRAM VARIABLE ROD    Culture    Final    FEW STREPTOCOCCUS PNEUMONIAE FEW STAPHYLOCOCCUS AUREUS SUSCEPTIBILITIES TO FOLLOW Performed at [facility], [place] [address], [place], [place] [address]    Report Status PENDING  Incomplete    Coagulation Studies: No results for input(s): LABPROT, INR in the last 72 hours.  Urinalysis: No results for input(s): COLORURINE, LABSPEC, PHURINE, GLUCOSEU, HGBUR, BILIRUBINUR, KETONESUR, PROTEINUR, UROBILINOGEN, NITRITE, LEUKOCYTESUR in the last 72 hours.  Invalid input(s): APPERANCEUR    Imaging: EEG  Result Date: 07/17/2020 Lora Havens, MD     07/17/2020 10:21 PM Patient Name: Antonio [name] MRN: [id number] Epilepsy Attending: Lora Havens Referring Physician/Provider: Darel Hong, NP Date: 07/17/2020 Duration: Patient history: 75yo M s/p cardiac arrest. EEG to evaluate for seizure. Level of alertness:  Comatose AEDs during EEG study: Propofol Technical aspects: This EEG study was done with scalp electrodes positioned according to the 10-20 International system of electrode placement. Electrical activity was acquired at a sampling rate of _0  and reviewed with a high frequency filter of _1  and a low frequency filter of _2 . EEG data were recorded continuously and digitally stored. Description: EEG showed continuous generalized predominantly 2-3 Hz delta as well as 5-_3  theta slowing. EEG was not reactive to noxious stimuli.  Hyperventilation and photic stimulation were not performed.   ABNORMALITY -Continuous slow, generalized IMPRESSION: This study is suggestive of severe diffuse encephalopathy, nonspecific etiology but could be related to sedation, anoxic/hypoxic brain injury. No seizures or epileptiform discharges were seen throughout the recording. Lora Havens   CT HEAD WO CONTRAST  Result Date: 07/17/2020 CLINICAL DATA:  Mental status change. Patient intubated. Status post cardiac arrest. EXAM: CT HEAD WITHOUT CONTRAST TECHNIQUE: Contiguous axial images  were obtained from the base of the skull through the vertex without intravenous contrast. COMPARISON:  CT head without contrast 08/21/2019 FINDINGS: Brain: Patchy areas of hypoattenuation are present in the right ACA and PCA territories. There is focal loss cortical hyperintensity sulcal effacement. Right ACA territory is spared. Mild white matter changes are present bilaterally otherwise. A remote left temporal lobe infarct is stable. Left hemisphere is otherwise unremarkable. Basal ganglia are within normal limits bilaterally. Brainstem and cerebellum are normal. Left para falcine meningioma is stable. Vascular: Atherosclerotic calcifications are present within the cavernous internal carotid arteries bilaterally. No hyperdense vessel is present. Skull: Insert normal skull No significant extracranial  soft tissue lesion is present. Sinuses/Orbits: Patient is intubated. Fluid is present nasopharynx. Bilateral maxillary antrostomies are present. Diffuse mucosal thickening is present maxillary sinuses bilaterally without evidence of chronic disease. Scattered ethmoid opacification present well. The mastoid air cells are clear. Bilateral exophthalmos is chronic. Globes and orbits are otherwise within normal limits. IMPRESSION: 1. Patchy areas of hypoattenuation in the right ACA and PCA territories compatible with acute/subacute infarcts. Local mass effect present with some effacement of the sulci but no midline shift. 2. No acute hemorrhage. 3. Remote left temporal lobe infarct. 4. Atherosclerosis. These results were called by telephone at the time of interpretation on 07/17/2020 at 2:39 pm to provider Darel Hong, NP , who verbally acknowledged these results. Electronically Signed   By: San Morelle M.D.   On: 07/17/2020 14:39   MR ANGIO HEAD WO CONTRAST  Result Date: 07/18/2020 CLINICAL DATA:  Stroke follow-up EXAM: MRI HEAD WITHOUT CONTRAST MRA HEAD WITHOUT CONTRAST TECHNIQUE: Multiplanar, multiecho  pulse sequences of the brain and surrounding structures were obtained without intravenous contrast. Angiographic images of the head were obtained using MRA technique without contrast. COMPARISON:  CT head 07/17/2020 FINDINGS: MRI HEAD FINDINGS Brain: Acute infarct right occipital pole. Small acute infarct right lateral temporal lobe. Scattered cortical infarcts involving the right frontal parietal lobe over the convexity in the right MCA territory. Small amount of associated hemorrhage in the right frontal convexity infarct and in the right anterior frontal infarct. Small acute infarct left frontal cortex over the convexity. Small acute infarct left head of caudate. Ventricle size normal. Chronic infarct left lateral temporal lobe. Negative for mass lesion. Vascular: Normal arterial flow voids Skull and upper cervical spine: No focal skeletal lesion. Sinuses/Orbits: Extensive mucosal edema paranasal sinuses. Negative orbit Other: None MRA HEAD FINDINGS Right vertebral artery dominant. Both vertebral arteries patent to the basilar without stenosis. PICA patent bilaterally. Basilar patent. Superior cerebellar and posterior cerebral arteries patent bilaterally. Fetal origin right posterior cerebral artery. Irregularity and moderate to severe stenosis in the cavernous carotid bilaterally. No occlusion. Anterior and middle cerebral arteries patent bilaterally. Mild stenosis left A1 and left M1 segments. Negative for large vessel occlusion. Negative for aneurysm. IMPRESSION: 1. Multiple areas of acute cortical infarct in the right MCA territory involving right frontal parietal cortex with mild associated hemorrhage. Acute infarct in the right occipital lobe in the right PCA territory. There is fetal origin of the right posterior cerebral artery which could account for this infarct. Possible emboli. In addition, small areas of acute infarct in the left frontal cortex over the convexity and head of caudate on the left. 2.  Small chronic infarct left lateral temporal lobe. 3. 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 No intracranial large vessel occlusion. Electronically Signed   By: Franchot Gallo M.D.   On: 07/18/2020 16:24   MR ANGIO NECK W WO CONTRAST  Result Date: 07/18/2020 CLINICAL DATA:  Stroke EXAM: MRA NECK WITHOUT AND WITH CONTRAST TECHNIQUE: Multiplanar and multiecho pulse sequences of the neck were obtained without and with intravenous contrast. Angiographic images of the neck were obtained using MRA technique without and with intravenous contrast. CONTRAST:  6m GADAVIST GADOBUTROL 1 MMOL/ML IV SOLN COMPARISON:  None. FINDINGS: Antegrade flow in the carotid and vertebral arteries bilaterally. Carotid bifurcation patent bilaterally without stenosis. There is severe stenosis in the cavernous carotid bilaterally due to atherosclerotic disease Right vertebral artery dominant and widely patent to the basilar. Non dominant right vertebral artery is patent to the  basilar with mild stenosis distally. This vessel is best seen on the source images. 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 Both vertebral arteries patent to the basilar. Electronically Signed   By: Franchot Gallo M.D.   On: 07/18/2020 16:26   MR BRAIN WO CONTRAST  Result Date: 07/18/2020 CLINICAL DATA:  Stroke follow-up EXAM: MRI HEAD WITHOUT CONTRAST MRA HEAD WITHOUT CONTRAST TECHNIQUE: Multiplanar, multiecho pulse sequences of the brain and surrounding structures were obtained without intravenous contrast. Angiographic images of the head were obtained using MRA technique without contrast. COMPARISON:  CT head 07/17/2020 FINDINGS: MRI HEAD FINDINGS Brain: Acute infarct right occipital pole. Small acute infarct right lateral temporal lobe. Scattered cortical infarcts involving the right frontal parietal lobe over the convexity in the  right MCA territory. Small amount of associated hemorrhage in the right frontal convexity infarct and in the right anterior frontal infarct. Small acute infarct left frontal cortex over the convexity. Small acute infarct left head of caudate. Ventricle size normal. Chronic infarct left lateral temporal lobe. Negative for mass lesion. Vascular: Normal arterial flow voids Skull and upper cervical spine: No focal skeletal lesion. Sinuses/Orbits: Extensive mucosal edema paranasal sinuses. Negative orbit Other: None MRA HEAD FINDINGS Right vertebral artery dominant. Both vertebral arteries patent to the basilar without stenosis. PICA patent bilaterally. Basilar patent. Superior cerebellar and posterior cerebral arteries patent bilaterally. Fetal origin right posterior cerebral artery. Irregularity and moderate to severe stenosis in the cavernous carotid bilaterally. No occlusion. Anterior and middle cerebral arteries patent bilaterally. Mild stenosis left A1 and left M1 segments. Negative for large vessel occlusion. Negative for aneurysm. IMPRESSION: 1. Multiple areas of acute cortical infarct in the right MCA territory involving right frontal parietal cortex with mild associated hemorrhage. Acute infarct in the right occipital lobe in the right PCA territory. There is fetal origin of the right posterior cerebral artery which could account for this infarct. Possible emboli. In addition, small areas of acute infarct in the left frontal cortex over the convexity and head of caudate on the left. 2. Small chronic infarct left lateral temporal lobe. 3. 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 No intracranial large vessel occlusion. Electronically Signed   By: Franchot Gallo M.D.   On: 07/18/2020 16:24   DG Chest Port 1 View  Result Date: 07/18/2020 CLINICAL DATA:  Acute respiratory failure, hypoxia EXAM: PORTABLE CHEST 1 VIEW COMPARISON:  None. FINDINGS: Endotracheal tube  and nasogastric tube are unchanged. Moderate left pleural effusion again identified with associated retrocardiac opacification. Small right pleural effusion suspected with opacification of the right lung base. Mild perihilar interstitial pulmonary edema is present, possibly cardiogenic in nature. Mild cardiomegaly is stable. No pneumothorax. IMPRESSION: Mild perihilar pulmonary edema and bilateral pleural effusions, right greater than left in keeping with mild cardiogenic failure, unchanged. Stable support tubes. Electronically Signed   By: Fidela Salisbury MD   On: 07/18/2020 05:21   DG Chest Port 1 View  Result Date: 07/17/2020 CLINICAL DATA:  Intubated patient status post acute hypoxic respiratory failure 07/18/2020. EXAM: PORTABLE CHEST 1 VIEW COMPARISON:  Single-view of the chest 07/16/2020. FINDINGS: NG tube courses into the stomach and below the inferior margin of the film. Endotracheal tube tip is approximately 2 cm above the carina. Bibasilar airspace disease and effusions persist. There is cardiomegaly. No pneumothorax. IMPRESSION: No change in bilateral effusions and airspace disease. Electronically Signed   By: Inge Rise M.D.   On: 07/17/2020 12:38  Medications:   . sodium chloride 5 mL/hr at 07/19/20 0600  . ampicillin-sulbactam (UNASYN) IV 3 g (07/19/20 1123)  . feeding supplement (VITAL HIGH PROTEIN) 45 mL/hr at 07/19/20 0500  . fentaNYL infusion INTRAVENOUS 50 mcg/hr (07/19/20 0600)  . propofol (DIPRIVAN) infusion 20 mcg/kg/min (07/19/20 0810)   . aspirin  81 mg Per Tube Daily  . atorvastatin  40 mg Per Tube Daily  . chlorhexidine gluconate (MEDLINE KIT)  15 mL Mouth Rinse BID  . Chlorhexidine Gluconate Cloth  6 each Topical Daily  . docusate  100 mg Per Tube BID  . enoxaparin (LOVENOX) injection  0.5 mg/kg Subcutaneous Q24H  . famotidine  20 mg Per Tube BID  . feeding supplement (PROSource TF)  90 mL Per Tube TID  . folic acid  2 mg Oral Daily  .  ipratropium-albuterol  3 mL Nebulization Q6H  . mouth rinse  15 mL Mouth Rinse 10 times per day  . metoprolol tartrate  12.5 mg Per Tube BID  . multivitamin with minerals  1 tablet Oral Daily  . mupirocin ointment  1 application Nasal BID  . thiamine injection  100 mg Intravenous Daily   acetaminophen, fentaNYL, fentaNYL (SUBLIMAZE) injection, fentaNYL (SUBLIMAZE) injection, polyethylene glycol  Assessment/ Plan:  75 y.o. male  with a PMHx of COPD, chronic diastolic heart failure, hypertension, morbid obesity, alcohol abuse, tobacco abuse, who was admitted to [facility] on 06/30/2020 for evaluation of cardiac arrest.   1.  Acute kidney injury.  Likely secondary to cardiac arrest and subsequent renal ischemia.    Baseline creatinine 0.8 on 08/17/2019.  Creatinine down slightly to 1.4 today.  Good urine output at 975 cc over the preceding 24 hours.  No indication for dialysis at the moment.  2.  Acute respiratory failure/PEA cardiac arrest.  Multiple areas of infarct noted on MRI brain.  Palliative care has been consulted.  Appreciate cardiology input as well.  Lasix being considered.   LOS: 3 Shannette Tabares 1/1/202212:14 PM

## 2020-07-19 DEATH — deceased

## 2020-07-20 ENCOUNTER — Other Ambulatory Visit: Payer: Self-pay

## 2020-07-20 ENCOUNTER — Encounter: Payer: Self-pay | Admitting: Pulmonary Disease

## 2020-07-20 DIAGNOSIS — J810 Acute pulmonary edema: Secondary | ICD-10-CM

## 2020-07-20 DIAGNOSIS — I6310 Cerebral infarction due to embolism of unspecified precerebral artery: Secondary | ICD-10-CM

## 2020-07-20 DIAGNOSIS — I6523 Occlusion and stenosis of bilateral carotid arteries: Secondary | ICD-10-CM

## 2020-07-20 DIAGNOSIS — I739 Peripheral vascular disease, unspecified: Secondary | ICD-10-CM

## 2020-07-20 LAB — CULTURE, BLOOD (ROUTINE X 2): Culture: NO GROWTH

## 2020-07-20 LAB — CULTURE, RESPIRATORY W GRAM STAIN

## 2020-07-20 MED ORDER — FUROSEMIDE 10 MG/ML IJ SOLN
40.0000 mg | Freq: Once | INTRAMUSCULAR | Status: AC
  Administered 2020-07-20: 40 mg via INTRAVENOUS
  Filled 2020-07-20: qty 4

## 2020-07-31 ENCOUNTER — Ambulatory Visit: Payer: Self-pay | Admitting: Family

## 2020-08-19 NOTE — Progress Notes (Signed)
[facility]  ROUNDING NOTE   Subjective:  Family at bedside today. Remains critically ill. Urine output 850 cc with a creatinine of 1.4.   Objective:  Vital signs in last 24 hours:  Temp:  [97.52 F (36.4 C)-98.6 F (37 C)] 98.3 F (36.8 C) (01/02 0800) Pulse Rate:  [57-150] 150 (01/02 1200) Resp:  [19-26] 23 (01/02 1200) BP: (137-223)/(35-81) 167/48 (01/02 1100) SpO2:  [66 %-98 %] 66 % (01/02 1200) FiO2 (%):  [35 %] 35 % (01/02 0810) Weight:  HU:[id number] kg] 117 kg (01/02 0500)  Weight change: -0.2 kg Filed Weights   07/16/20 0200 07/19/20 0422 2020-08-04 0500  Weight: 114.8 kg 117.2 kg 117 kg    Intake/Output: I/O last 3 completed shifts: In: 811.3 [I.V.:588.4; IV Piggyback:222.9] Out: 1225 [Urine:1225]   Intake/Output this shift:  Total I/O In: 12 [I.V.:12] Out: 950 [Urine:950]  Physical Exam: General:  Critically ill-appearing  Head:  Endotracheal tube in place  Eyes:  Anicteric  Neck:  Supple  Lungs:   Clear to auscultation, vent assisted  Heart:  S1S2 no rubs  Abdomen:   Soft, nontender, bowel sounds present  Extremities:  1+ peripheral edema.  Neurologic:  Intubated, sedated  Skin:  No acute rash  Access:  No hemodialysis access    Basic Metabolic Panel: Recent Labs  Lab 07/16/20 0510 07/16/20 1418 07/17/20 0702 07/17/20 1721 07/18/20 0510 07/19/20 0512  NA 137 137 136  --  137 140  K 4.4 4.9 4.0  --  3.7 3.4*  CL 103 104 102  --  103 108  CO2 25 24 24   --  24 26  GLUCOSE 143* 120* 98  --  107* 131*  BUN 21 26* 36*  --  45* 40*  CREATININE 1.62* 2.03* 2.40*  --  1.69* 1.44*  CALCIUM 8.0* 7.9* 7.5*  --  8.1* 8.1*  MG 2.0 2.0 2.1 2.2 2.4 2.3  PHOS 3.5 5.0* 3.7 3.5 3.0 2.6    Liver Function Tests: Recent Labs  Lab 06/26/2020 2113 07/16/20 0510  AST 55* 47*  ALT 35 38  ALKPHOS 75 60  BILITOT 0.8 0.6  PROT 7.0 6.8  ALBUMIN 3.4* 3.4*   No results for input(s): LIPASE, AMYLASE in the last 168 hours. No results for input(s):  AMMONIA in the last 168 hours.  CBC: Recent Labs  Lab 06/20/2020 2113 07/16/20 0510 07/17/20 0702 07/18/20 0510 07/19/20 0512  WBC 21.7* 14.3* 15.1* 12.4* 7.7  NEUTROABS 11.1* 12.5*  --   --   --   HGB 14.1 14.4 12.4* 13.6 11.5*  HCT 44.8 42.9 38.6* 41.5 35.2*  MCV 102.3* 97.9 100.8* 99.0 98.9  PLT 323 284 288 232 198    Cardiac Enzymes: No results for input(s): CKTOTAL, CKMB, CKMBINDEX, TROPONINI in the last 168 hours.  BNP: Invalid input(s): POCBNP  CBG: Recent Labs  Lab 07/18/20 1944 07/18/20 2308 07/19/20 0339 07/19/20 0744 07/19/20 1142  GLUCAP 94 109* 92 90 98    Microbiology: Results for orders placed or performed during the hospital encounter of 07/13/2020  Resp Panel by RT-PCR (Flu A&B, Covid) Nasopharyngeal Swab     Status: None   Collection Time: 07/09/2020  9:13 PM   Specimen: Nasopharyngeal Swab; Nasopharyngeal(NP) swabs in vial transport medium  Result Value Ref Range Status   SARS Coronavirus 2 by RT PCR NEGATIVE NEGATIVE Final    Comment: (NOTE) SARS-CoV-2 target nucleic acids are NOT DETECTED.  The SARS-CoV-2 RNA is generally detectable in upper respiratory specimens  during the acute phase of infection. The lowest concentration of SARS-CoV-2 viral copies this assay can detect is 138 copies/mL. A negative result does not preclude SARS-Cov-2 infection and should not be used as the sole basis for treatment or other patient management decisions. A negative result may occur with  improper specimen collection/handling, submission of specimen other than nasopharyngeal swab, presence of viral mutation(s) within the areas targeted by this assay, and inadequate number of viral copies(<138 copies/mL). A negative result must be combined with clinical observations, patient history, and epidemiological information. The expected result is Negative.  Fact Sheet for Patients:  EntrepreneurPulse.com.au  Fact Sheet for Healthcare Providers:   IncredibleEmployment.be  This test is no t yet approved or cleared by the [place] FDA and  has been authorized for detection and/or diagnosis of SARS-CoV-2 by FDA under an Emergency Use Authorization (EUA). This EUA will remain  in effect (meaning this test can be used) for the duration of the COVID-19 declaration under Section 564(b)(1) of the Act, 21 U.S.C.section 360bbb-3(b)(1), unless the authorization is terminated  or revoked sooner.       Influenza A by PCR NEGATIVE NEGATIVE Final   Influenza B by PCR NEGATIVE NEGATIVE Final    Comment: (NOTE) The Xpert Xpress SARS-CoV-2/FLU/RSV plus assay is intended as an aid in the diagnosis of influenza from Nasopharyngeal swab specimens and should not be used as a sole basis for treatment. Nasal washings and aspirates are unacceptable for Xpert Xpress SARS-CoV-2/FLU/RSV testing.  Fact Sheet for Patients: EntrepreneurPulse.com.au  Fact Sheet for Healthcare Providers: IncredibleEmployment.be  This test is not yet approved or cleared by the [place] FDA and has been authorized for detection and/or diagnosis of SARS-CoV-2 by FDA under an Emergency Use Authorization (EUA). This EUA will remain in effect (meaning this test can be used) for the duration of the COVID-19 declaration under Section 564(b)(1) of the Act, 21 U.S.C. section 360bbb-3(b)(1), unless the authorization is terminated or revoked.  Performed at [facility], [address] [place], [place] [address]   Culture, blood (routine x 2)     Status: None   Collection Time: 06/18/2020  9:18 PM   Specimen: BLOOD  Result Value Ref Range Status   Specimen Description BLOOD LEFT ANTECUBITAL  Final   Special Requests   Final    BOTTLES DRAWN AEROBIC AND ANAEROBIC Blood Culture results may not be optimal due to an inadequate volume of blood received in culture bottles   Culture   Final    NO GROWTH 5  DAYS Performed at [facility], [address] [place], [place] [address]    Report Status 08/16/20 FINAL  Final  MRSA PCR Screening     Status: Abnormal   Collection Time: 07/16/20  5:55 AM   Specimen: Nasopharyngeal  Result Value Ref Range Status   MRSA by PCR POSITIVE (A) NEGATIVE Final    Comment:        The GeneXpert MRSA Assay (FDA approved for NASAL specimens only), is one component of a comprehensive MRSA colonization surveillance program. It is not intended to diagnose MRSA infection nor to guide or monitor treatment for MRSA infections. RESULT CALLED TO, READ BACK BY AND VERIFIED WITHSallye Lat AT [id number] 07/16/20 SDR Performed at [facility], [address] [place], [place] [address]   Urine Culture     Status: None   Collection Time: 07/16/20  5:05 PM   Specimen: Urine, Random  Result Value Ref Range Status  Specimen Description   Final    URINE, RANDOM Performed at [facility], [address], [place], [place] [address]    Special Requests   Final    NONE Performed at [facility], [address] [place], [place] [address]    Culture   Final    NO GROWTH Performed at [facility], [place] [address] [place], [place] [address]    Report Status 07/18/2020 FINAL  Final  Culture, respiratory     Status: None   Collection Time: 07/17/20  9:14 AM   Specimen: Tracheal Aspirate; Respiratory  Result Value Ref Range Status   Specimen Description   Final    TRACHEAL ASPIRATE Performed at [facility], [address] [place], [place] [address]    Special Requests   Final    NONE Performed at [facility], [place], [place] [address]    Gram Stain   Final    FEW WBC PRESENT,BOTH PMN AND MONONUCLEAR RARE GRAM POSITIVE COCCI RARE GRAM VARIABLE ROD Performed at [facility], [place] [address], [place], [place] [address]    Culture   Final    FEW STREPTOCOCCUS  PNEUMONIAE FEW METHICILLIN RESISTANT STAPHYLOCOCCUS AUREUS    Report Status Aug 13, 2020 FINAL  Final   Organism ID, Bacteria METHICILLIN RESISTANT STAPHYLOCOCCUS AUREUS  Final   Organism ID, Bacteria STREPTOCOCCUS PNEUMONIAE  Final      Susceptibility   Methicillin resistant staphylococcus aureus - MIC*    CIPROFLOXACIN <=0.5 SENSITIVE Sensitive     ERYTHROMYCIN >=8 RESISTANT Resistant     GENTAMICIN <=0.5 SENSITIVE Sensitive     OXACILLIN >=4 RESISTANT Resistant     TETRACYCLINE <=1 SENSITIVE Sensitive     VANCOMYCIN <=0.5 SENSITIVE Sensitive     TRIMETH/SULFA <=10 SENSITIVE Sensitive     CLINDAMYCIN <=0.25 SENSITIVE Sensitive     RIFAMPIN <=0.5 SENSITIVE Sensitive     Inducible Clindamycin NEGATIVE Sensitive     * FEW METHICILLIN RESISTANT STAPHYLOCOCCUS AUREUS   Streptococcus pneumoniae - MIC*    ERYTHROMYCIN <=0.12 SENSITIVE Sensitive     LEVOFLOXACIN 1 SENSITIVE Sensitive     VANCOMYCIN 0.5 SENSITIVE Sensitive     PENO - penicillin <=0.06      PENICILLIN (non-meningitis) <=0.06 SENSITIVE Sensitive     PENICILLIN (oral) <=0.06 SENSITIVE Sensitive     CEFTRIAXONE (non-meningitis) <=0.12 SENSITIVE Sensitive     * FEW STREPTOCOCCUS PNEUMONIAE    Coagulation Studies: No results for input(s): LABPROT, INR in the last 72 hours.  Urinalysis: No results for input(s): COLORURINE, LABSPEC, PHURINE, GLUCOSEU, HGBUR, BILIRUBINUR, KETONESUR, PROTEINUR, UROBILINOGEN, NITRITE, LEUKOCYTESUR in the last 72 hours.  Invalid input(s): APPERANCEUR    Imaging: MR ANGIO HEAD WO CONTRAST  Result Date: 07/18/2020 CLINICAL DATA:  Stroke follow-up EXAM: MRI HEAD WITHOUT CONTRAST MRA HEAD WITHOUT CONTRAST TECHNIQUE: Multiplanar, multiecho pulse sequences of the brain and surrounding structures were obtained without intravenous contrast. Angiographic images of the head were obtained using MRA technique without contrast. COMPARISON:  CT head 07/17/2020 FINDINGS: MRI HEAD FINDINGS Brain: Acute infarct  right occipital pole. Small acute infarct right lateral temporal lobe. Scattered cortical infarcts involving the right frontal parietal lobe over the convexity in the right MCA territory. Small amount of associated hemorrhage in the right frontal convexity infarct and in the right anterior frontal infarct. Small acute infarct left frontal cortex over the convexity. Small acute infarct left head of caudate. Ventricle size normal. Chronic infarct left lateral temporal lobe. Negative  for mass lesion. Vascular: Normal arterial flow voids Skull and upper cervical spine: No focal skeletal lesion. Sinuses/Orbits: Extensive mucosal edema paranasal sinuses. Negative orbit Other: None MRA HEAD FINDINGS Right vertebral artery dominant. Both vertebral arteries patent to the basilar without stenosis. PICA patent bilaterally. Basilar patent. Superior cerebellar and posterior cerebral arteries patent bilaterally. Fetal origin right posterior cerebral artery. Irregularity and moderate to severe stenosis in the cavernous carotid bilaterally. No occlusion. Anterior and middle cerebral arteries patent bilaterally. Mild stenosis left A1 and left M1 segments. Negative for large vessel occlusion. Negative for aneurysm. IMPRESSION: 1. Multiple areas of acute cortical infarct in the right MCA territory involving right frontal parietal cortex with mild associated hemorrhage. Acute infarct in the right occipital lobe in the right PCA territory. There is fetal origin of the right posterior cerebral artery which could account for this infarct. Possible emboli. In addition, small areas of acute infarct in the left frontal cortex over the convexity and head of caudate on the left. 2. Small chronic infarct left lateral temporal lobe. 3. 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 No intracranial large vessel occlusion. Electronically Signed   By: Marlan Palau M.D.   On: 07/18/2020 16:24   MR ANGIO  NECK W WO CONTRAST  Result Date: 07/18/2020 CLINICAL DATA:  Stroke EXAM: MRA NECK WITHOUT AND WITH CONTRAST TECHNIQUE: Multiplanar and multiecho pulse sequences of the neck were obtained without and with intravenous contrast. Angiographic images of the neck were obtained using MRA technique without and with intravenous contrast. CONTRAST:  29mL GADAVIST GADOBUTROL 1 MMOL/ML IV SOLN COMPARISON:  None. FINDINGS: Antegrade flow in the carotid and vertebral arteries bilaterally. Carotid bifurcation patent bilaterally without stenosis. There is severe stenosis in the cavernous carotid bilaterally due to atherosclerotic disease Right vertebral artery dominant and widely patent to the basilar. Non dominant right vertebral artery is patent to the basilar with mild stenosis distally. This vessel is best seen on the source images. 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 Both vertebral arteries patent to the basilar. Electronically Signed   By: Marlan Palau M.D.   On: 07/18/2020 16:26   MR BRAIN WO CONTRAST  Result Date: 07/18/2020 CLINICAL DATA:  Stroke follow-up EXAM: MRI HEAD WITHOUT CONTRAST MRA HEAD WITHOUT CONTRAST TECHNIQUE: Multiplanar, multiecho pulse sequences of the brain and surrounding structures were obtained without intravenous contrast. Angiographic images of the head were obtained using MRA technique without contrast. COMPARISON:  CT head 07/17/2020 FINDINGS: MRI HEAD FINDINGS Brain: Acute infarct right occipital pole. Small acute infarct right lateral temporal lobe. Scattered cortical infarcts involving the right frontal parietal lobe over the convexity in the right MCA territory. Small amount of associated hemorrhage in the right frontal convexity infarct and in the right anterior frontal infarct. Small acute infarct left frontal cortex over the convexity. Small acute infarct left head of caudate. Ventricle size  normal. Chronic infarct left lateral temporal lobe. Negative for mass lesion. Vascular: Normal arterial flow voids Skull and upper cervical spine: No focal skeletal lesion. Sinuses/Orbits: Extensive mucosal edema paranasal sinuses. Negative orbit Other: None MRA HEAD FINDINGS Right vertebral artery dominant. Both vertebral arteries patent to the basilar without stenosis. PICA patent bilaterally. Basilar patent. Superior cerebellar and posterior cerebral arteries patent bilaterally. Fetal origin right posterior cerebral artery. Irregularity and moderate to severe stenosis in the cavernous carotid bilaterally. No occlusion. Anterior and middle cerebral arteries patent bilaterally. Mild stenosis left A1 and left M1 segments. Negative  for large vessel occlusion. Negative for aneurysm. IMPRESSION: 1. Multiple areas of acute cortical infarct in the right MCA territory involving right frontal parietal cortex with mild associated hemorrhage. Acute infarct in the right occipital lobe in the right PCA territory. There is fetal origin of the right posterior cerebral artery which could account for this infarct. Possible emboli. In addition, small areas of acute infarct in the left frontal cortex over the convexity and head of caudate on the left. 2. Small chronic infarct left lateral temporal lobe. 3. 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 No intracranial large vessel occlusion. Electronically Signed   By: Franchot Gallo M.D.   On: 07/18/2020 16:24     Medications:   . sodium chloride 5 mL/hr at 07/22/2020 0800  . fentaNYL infusion INTRAVENOUS Stopped (07/19/20 1833)  . morphine 7 mg/hr (July 22, 2020 1058)   . mouth rinse  15 mL Mouth Rinse 10 times per day   acetaminophen, glycopyrrolate, LORazepam, midazolam  Assessment/ Plan:  75 y.o. male  with a PMHx of COPD, chronic diastolic heart failure, hypertension, morbid obesity, alcohol abuse, tobacco abuse, who was  admitted to [facility] on 07/10/2020 for evaluation of cardiac arrest.   1.  Acute kidney injury.  Likely secondary to cardiac arrest and subsequent renal ischemia.    Baseline creatinine 0.8 on 08/17/2019.   -Creatinine currently 1.4 with urine output of 850 cc.  No indication for dialysis.  2.  Acute respiratory failure/PEA cardiac arrest.  Multiple areas of infarct noted on MRI brain.  Palliative care has been consulted.  One-way extubation planned for today.  Patient DNR status..   LOS: 4 Ramell Wacha 01-04-222:08 PM

## 2020-08-19 NOTE — Progress Notes (Signed)
NAME:  Antonio [name], MRN:  [id number], DOB:  1946-04-23, LOS: 4 ADMISSION DATE:  07/16/2020, CONSULTATION DATE:  07/16/2020 REFERRING MD:  Dr. Charna Archer, CHIEF COMPLAINT:  Cardiac arrest  Brief History:  [name] is a 75 year old male presenting with acute hypoxic respiratory failure, subsequently PEA arrest with ROSC after 15 minutes, decompensated heart failure.   Significant Hospital Events:  12/30: on WUA, no purposeful movement and does not track, very asynchronous with vent and hypoxic; CT Head obtained showing:1. Patchy areas of hypoattenuation in the right ACA and PCA territories compatible with acute/subacute infarcts. Local mass effect present with some effacement of the sulci but no midline Shift. 2. No acute hemorrhage. 3. Remote left temporal lobe infarct. 4. Atherosclerosis. 12/31: Off vasopressors.  Amiodarone on hold due to Bradycardia.  Plan for MRI Brain.  Consults:  PCCM Cardiology Neurology  Palliative Care  Procedures:  N/A  Significant Diagnostic Tests:  12/30: EEG>>This study is suggestive of severe diffuse encephalopathy, nonspecific etiology but could be related to sedation, anoxic/hypoxic brain injury. No seizures or epileptiform discharges were seen throughout the recording. 12/30: CT Head>>1. Patchy areas of hypoattenuation in the right ACA and PCA territories compatible with acute/subacute infarcts. Local mass effect present with some effacement of the sulci but no midline shift. 2. No acute hemorrhage. 3. Remote left temporal lobe infarct. 4. Atherosclerosis. 12/31: MRI Brain>> multiple areas of acute cortical infarct in the right MCA territory involving right frontal parietal cortex with mild associated hemorrhage.  Acute infarct on the right occipital lobe with right PCA territory.  Small chronic infarct on the left lateral temporal lobe.   Micro Data:  12/28: SARS-CoV-2 PCR>>negative 12/28: Influenza PCR>>negative 12/28: Blood  culture>> 12/29: Urine>> 12/30: Tracheal aspirate>>  Antimicrobials:  Zosyn 12/29>>  Interim History / Subjective:  Patient has been transitioned to comfort family want to proceed with compassionate extubation  Objective   Blood pressure (!) 169/37, pulse (!) 59, temperature 98.3 F (36.8 C), temperature source Axillary, resp. rate (!) 24, height _0  (1.676 m), weight 117 kg, SpO2 97 %.    Vent Mode: PRVC FiO2 (%):  [35 %] 35 % Set Rate:  [24 bmp] 24 bmp Vt Set:  [450 mL] 450 mL PEEP:  [5 cmH20] 5 cmH20 Plateau Pressure:  [17 cmH20-46 cmH20] 46 cmH20   Intake/Output Summary (Last 24 hours) at 2020/07/30 1010 Last data filed at 07/30/2020 0800 Gross per 24 hour  Intake 405.66 ml  Output 625 ml  Net -219.34 ml   Filed Weights   07/16/20 0200 07/19/20 0422 07/30/2020 0500  Weight: 114.8 kg 117.2 kg 117 kg    Examination: [name]: Critically ill appearing male, laying in bed, intubated, synchronous with vent  HEENT: Atraumatic, normocephalic, neck supple, no JVD, ETT in place Lungs: Clear breath sounds bilaterally, no wheezing or rales, synchronous with vent, even Cardiovascular: Bradycardia, regular rhythm, s1s2, no M/R/G Abdomen: Obese, soft, nontender, nondistended, no guarding or rebound tenderness, BS+ x4 Extremities: No deformities, no clubbing, 2+ edema bilateral LE's Neuro: Sedated, withdraws from pain, upward gaze, pupils PERRL (sluggish) Skin: Warm and dry.  No obvious rashes, lesions, or ulcerations  Resolved Hospital Problem list   N/A  Assessment & Plan:   Acute hypoxic respiratory failure Acute exacerbation of diastolic heart failure/ pulmonary edema COPD exacerbation Aspiration pneumonia ruled out  -Continue vent support for now -Wean FiO2 and PEEP as tolerated to maintain O2 sats greater than 92% -Follow intermittent chest x-ray and ABG as needed -VAP  bundle implemented -Spontaneous breathing trials when respiratory parameters met and mental status  permits -Diuresis as blood pressure and renal function permits, currently unable to diurese due to AKI -Monitor fever curve -Trend WBC's and Procalcitonin -Tracheal aspirate strep pneumo, few staph aureus   PEA arrest Non-sustained Vtach Elevated troponin, demand ischemia versus NSTEMI Poor prognosis   AKI Noted on admission  Acute/subacute CVAs to the right ACA/PCA Territories (likely Cardioembolic vs. Watershed in the setting of PEA arrest) Concomitant severe anoxic/ischemic encephalopathy Very poor prognosis    Pt is critically ill with multiorgan failure.  Prognosis is exceedingly poor.    Family has decided on comfort measures are appropriate.   Best practice (evaluated daily)  Diet: NPO Pain/Anxiety/Delirium protocol (if indicated): Propofol, Fentanyl VAP protocol (if indicated): Yes DVT prophylaxis: SCD's, Heparin SQ GI prophylaxis: Pepcid Glucose control: N/A Mobility: Bedrest Disposition:ICU  Goals of Care:  Last date of multidisciplinary goals of care discussion: 07/19/2020  Family and staff present:  Updated pt's son Antonio [name] at bedside Summary of discussion: Family wishes to transition to comfort care according to patient's prior wishes Proceed with with extubation.   Code Status: DNR/comfort measures    I have discussed the case with Dr. Lesleigh Noe, neurology consultant who agrees with transitioning the patient to comfort measures as his neurologic prognosis is dismal.    Level 3 follow-up     C. Derrill Kay, MD [facility]   *This note was dictated using voice recognition software/Dragon.  Despite best efforts to proofread, errors can occur which can change the meaning.  Any change was purely unintentional.

## 2020-08-19 NOTE — Progress Notes (Signed)
One way extubation completed

## 2020-08-19 NOTE — Progress Notes (Addendum)
Pt pronounced dead at 1600 by writing RN and Maurice March, RN.  Patient's son was at bedside at time of passing.  No belongings in room.  Son informed, he stated he was not concerned as his father would have only had the clothing he was wearing with him on arrival to the hospital.  Attempted to notify [facility] o f cardiac TOD (they were notified of CC status earlier today) but they did not pick up after 8 minutes on hold.  See post mortem flowsheet.  Body preparation will be done after son leaves room.

## 2020-08-19 NOTE — Death Summary Note (Signed)
DEATH SUMMARY   Patient Details  Name: Antonio [name] MRN: [id number] DOB: 11-18-1945  Admission/Discharge Information   Admit Date:  26-Jul-2020  Date of Death:   July 31, 2020  Time of Death:  16:00 HRS  Length of Stay: 4  Referring Physician: Patient, No Pcp Per   Reason(s) for Hospitalization  Acute hypoxic respiratory failure with subsequent PEA arrest  Diagnoses  Preliminary cause of death:   PEA arrest due to acute respiratory failure with hypoxia Secondary Diagnoses (including complications and co-morbidities):  Principal Problem:   Acute respiratory failure with hypoxia (Antonio [name]) PEA arrest COPD exacerbation Sustained V. Tach Acute encephalopathy Demand ischemia Acute kidney injury due to renal ischemia Acute multifocal strokes likely cardioembolic versus atheroembolic query watershed [id number] status negative  Brief Hospital Course (including significant findings, care, treatment, and services provided and events leading to death)  Antonio [name] is a 75 y.o. year old male is a 75 year old male with history of COPD, chronic diastolic heart failure, hypertension, morbid obesity, alcohol abuse, tobacco abuse and poor healthcare follow-up who was brought to the ED via EMS after his roommate called his son to tell him the patient was having difficulty breathing.  The son asked the roommate to call 911 which he did.    EMS found the patient to be in respiratory distress and was placed on BiPAP.  Just upon arrival to ED, the patient reportedly lost consciousness, and was found to be in PEA arrest.  He received about 4 rounds of epinephrine and achieved ROSC after approximately 15 minutes.  He was intubated.  After achieving ROSC, the patient was then having runs of non-sustained Vtach for which he was started on Amiodarone drip.  He is being sedated with propofol and has stable blood pressures without need for vasopressors.  He was noted to have pink frothy secretions after  intubation, and CXR showed increased interstitial markings suggestive of pulmonary edema, and bilateral patchy airspace opacities.  Viral panel was negative for COVID and influenza A/B.  hsTrop was initially 162, then 297, BNP of 562 and lactic acid was 5.4.   The patient's 2 sons -- Antonio [name] and Antonio [name] were bedside, and has elected to make the patient DNR.    Of note, the patient was admitted here 07/27/19 to 08/20/19 for acute hypoxic and hypercapnic respiratory failure related to COPD and heart failure exacerbation for which he required intubation for nearly 2 weeks.   According the the sons, the patient had not seen a PCP since his discharge from the hospital in Feb 2021, and has not been on any medications.  At baseline, he is fully functional, drives, does grocery shopping, etc, but has exercise limitations.  He lives alone but rents a room.  Per the sons, he sometimes drinks several cans of beer in day but is not a daily drinker.   The patient had expressed his desire previously to his sons that he did not want mechanical ventilation and would have wanted a DNR status.  He did not have an out of facility DO NOT RESUSCITATE order.  The remainder of events were as follows: 12/30: on WUA, no purposeful movement and does not track, very asynchronous with vent and hypoxic; CT Head obtained showing:1. Patchy areas of hypoattenuation in the right ACA and PCA territories compatible with acute/subacute infarcts. Local mass effect present with some effacement of the sulci but no midline Shift. 2. No acute hemorrhage. 3. Remote left temporal lobe infarct. 4. Atherosclerosis. 12/31: Off vasopressors.  Amiodarone on hold due to Bradycardia.  Plan for MRI Brain. 07/19/2020: Multifocal strokes likely cardioembolic versus atheroembolic versus watershed were noted on MRI.  The patient remained obtunded even off of sedation.  Discussion with sons, desire to transition the patient to comfort care and do one-way  extubation in the morning. Jul 28, 2020: One-way extubation performed and transition patient to comfort care per the family's request.  The patient remained obtunded and subsequently expired at 1600 hrs.   Pertinent Labs and Studies  Significant Diagnostic Studies EEG  Result Date: 07/17/2020 Antonio Havens, MD     07/17/2020 10:21 PM Patient Name: Antonio [name] MRN: [id number] Epilepsy Attending: Lora [name] Referring Physician/Provider: Darel Hong, NP Date: 07/17/2020 Duration: Patient history: 75yo M s/p cardiac arrest. EEG to evaluate for seizure. Level of alertness:  Comatose AEDs during EEG study: Propofol Technical aspects: This EEG study was done with scalp electrodes positioned according to the 10-20 International system of electrode placement. Electrical activity was acquired at a sampling rate of 500Hz  and reviewed with a high frequency filter of 70Hz  and a low frequency filter of 1Hz . EEG data were recorded continuously and digitally stored. Description: EEG showed continuous generalized predominantly 2-3 Hz delta as well as 5-6Hz  theta slowing. EEG was not reactive to noxious stimuli.  Hyperventilation and photic stimulation were not performed.   ABNORMALITY -Continuous slow, generalized IMPRESSION: This study is suggestive of severe diffuse encephalopathy, nonspecific etiology but could be related to sedation, anoxic/hypoxic brain injury. No seizures or epileptiform discharges were seen throughout the recording. Antonio [name]   CT HEAD WO CONTRAST  Result Date: 07/17/2020 CLINICAL DATA:  Mental status change. Patient intubated. Status post cardiac arrest. EXAM: CT HEAD WITHOUT CONTRAST TECHNIQUE: Contiguous axial images were obtained from the base of the skull through the vertex without intravenous contrast. COMPARISON:  CT head without contrast 08/21/2019 FINDINGS: Brain: Patchy areas of hypoattenuation are present in the right ACA and PCA territories. There is focal loss  cortical hyperintensity sulcal effacement. Right ACA territory is spared. Mild white matter changes are present bilaterally otherwise. A remote left temporal lobe infarct is stable. Left hemisphere is otherwise unremarkable. Basal ganglia are within normal limits bilaterally. Brainstem and cerebellum are normal. Left para falcine meningioma is stable. Vascular: Atherosclerotic calcifications are present within the cavernous internal carotid arteries bilaterally. No hyperdense vessel is present. Skull: Insert normal skull No significant extracranial soft tissue lesion is present. Sinuses/Orbits: Patient is intubated. Fluid is present nasopharynx. Bilateral maxillary antrostomies are present. Diffuse mucosal thickening is present maxillary sinuses bilaterally without evidence of chronic disease. Scattered ethmoid opacification present well. The mastoid air cells are clear. Bilateral exophthalmos is chronic. Globes and orbits are otherwise within normal limits. IMPRESSION: 1. Patchy areas of hypoattenuation in the right ACA and PCA territories compatible with acute/subacute infarcts. Local mass effect present with some effacement of the sulci but no midline shift. 2. No acute hemorrhage. 3. Remote left temporal lobe infarct. 4. Atherosclerosis. These results were called by telephone at the time of interpretation on 07/17/2020 at 2:39 pm to provider Darel Hong, NP , who verbally acknowledged these results. Electronically Signed   By: San Morelle M.D.   On: 07/17/2020 14:39   MR ANGIO HEAD WO CONTRAST  Result Date: 07/18/2020 CLINICAL DATA:  Stroke follow-up EXAM: MRI HEAD WITHOUT CONTRAST MRA HEAD WITHOUT CONTRAST TECHNIQUE: Multiplanar, multiecho pulse sequences of the brain and surrounding structures were obtained without intravenous contrast. Angiographic images of the head were obtained using MRA  technique without contrast. COMPARISON:  CT head 07/17/2020 FINDINGS: MRI HEAD FINDINGS Brain: Acute  infarct right occipital pole. Small acute infarct right lateral temporal lobe. Scattered cortical infarcts involving the right frontal parietal lobe over the convexity in the right MCA territory. Small amount of associated hemorrhage in the right frontal convexity infarct and in the right anterior frontal infarct. Small acute infarct left frontal cortex over the convexity. Small acute infarct left head of caudate. Ventricle size normal. Chronic infarct left lateral temporal lobe. Negative for mass lesion. Vascular: Normal arterial flow voids Skull and upper cervical spine: No focal skeletal lesion. Sinuses/Orbits: Extensive mucosal edema paranasal sinuses. Negative orbit Other: None MRA HEAD FINDINGS Right vertebral artery dominant. Both vertebral arteries patent to the basilar without stenosis. PICA patent bilaterally. Basilar patent. Superior cerebellar and posterior cerebral arteries patent bilaterally. Fetal origin right posterior cerebral artery. Irregularity and moderate to severe stenosis in the cavernous carotid bilaterally. No occlusion. Anterior and middle cerebral arteries patent bilaterally. Mild stenosis left A1 and left M1 segments. Negative for large vessel occlusion. Negative for aneurysm. IMPRESSION: 1. Multiple areas of acute cortical infarct in the right MCA territory involving right frontal parietal cortex with mild associated hemorrhage. Acute infarct in the right occipital lobe in the right PCA territory. There is fetal origin of the right posterior cerebral artery which could account for this infarct. Possible emboli. In addition, small areas of acute infarct in the left frontal cortex over the convexity and head of caudate on the left. 2. Small chronic infarct left lateral temporal lobe. 3. 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 No intracranial large vessel occlusion. Electronically Signed   By: Franchot Gallo M.D.   On: 07/18/2020 16:24   MR  ANGIO NECK W WO CONTRAST  Result Date: 07/18/2020 CLINICAL DATA:  Stroke EXAM: MRA NECK WITHOUT AND WITH CONTRAST TECHNIQUE: Multiplanar and multiecho pulse sequences of the neck were obtained without and with intravenous contrast. Angiographic images of the neck were obtained using MRA technique without and with intravenous contrast. CONTRAST:  79mL GADAVIST GADOBUTROL 1 MMOL/ML IV SOLN COMPARISON:  None. FINDINGS: Antegrade flow in the carotid and vertebral arteries bilaterally. Carotid bifurcation patent bilaterally without stenosis. There is severe stenosis in the cavernous carotid bilaterally due to atherosclerotic disease Right vertebral artery dominant and widely patent to the basilar. Non dominant right vertebral artery is patent to the basilar with mild stenosis distally. This vessel is best seen on the source images. 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 Both vertebral arteries patent to the basilar. Electronically Signed   By: Franchot Gallo M.D.   On: 07/18/2020 16:26   MR BRAIN WO CONTRAST  Result Date: 07/18/2020 CLINICAL DATA:  Stroke follow-up EXAM: MRI HEAD WITHOUT CONTRAST MRA HEAD WITHOUT CONTRAST TECHNIQUE: Multiplanar, multiecho pulse sequences of the brain and surrounding structures were obtained without intravenous contrast. Angiographic images of the head were obtained using MRA technique without contrast. COMPARISON:  CT head 07/17/2020 FINDINGS: MRI HEAD FINDINGS Brain: Acute infarct right occipital pole. Small acute infarct right lateral temporal lobe. Scattered cortical infarcts involving the right frontal parietal lobe over the convexity in the right MCA territory. Small amount of associated hemorrhage in the right frontal convexity infarct and in the right anterior frontal infarct. Small acute infarct left frontal cortex over the convexity. Small acute infarct left head of caudate. Ventricle size  normal. Chronic infarct left lateral temporal lobe. Negative for mass lesion. Vascular:  Normal arterial flow voids Skull and upper cervical spine: No focal skeletal lesion. Sinuses/Orbits: Extensive mucosal edema paranasal sinuses. Negative orbit Other: None MRA HEAD FINDINGS Right vertebral artery dominant. Both vertebral arteries patent to the basilar without stenosis. PICA patent bilaterally. Basilar patent. Superior cerebellar and posterior cerebral arteries patent bilaterally. Fetal origin right posterior cerebral artery. Irregularity and moderate to severe stenosis in the cavernous carotid bilaterally. No occlusion. Anterior and middle cerebral arteries patent bilaterally. Mild stenosis left A1 and left M1 segments. Negative for large vessel occlusion. Negative for aneurysm. IMPRESSION: 1. Multiple areas of acute cortical infarct in the right MCA territory involving right frontal parietal cortex with mild associated hemorrhage. Acute infarct in the right occipital lobe in the right PCA territory. There is fetal origin of the right posterior cerebral artery which could account for this infarct. Possible emboli. In addition, small areas of acute infarct in the left frontal cortex over the convexity and head of caudate on the left. 2. Small chronic infarct left lateral temporal lobe. 3. 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 No intracranial large vessel occlusion. Electronically Signed   By: Franchot Gallo M.D.   On: 07/18/2020 16:24   US Venous Img Lower Bilateral (DVT)  Result Date: 07/16/2020 CLINICAL DATA:  75 year old male with history of swelling EXAM: BILATERAL LOWER EXTREMITY VENOUS DOPPLER ULTRASOUND TECHNIQUE: Gray-scale sonography with graded compression, as well as color Doppler and duplex ultrasound were performed to evaluate the lower extremity deep venous systems from the level of the common femoral vein and including the common femoral,  femoral, profunda femoral, popliteal and calf veins including the posterior tibial, peroneal and gastrocnemius veins when visible. The superficial great saphenous vein was also interrogated. Spectral Doppler was utilized to evaluate flow at rest and with distal augmentation maneuvers in the common femoral, femoral and popliteal veins. COMPARISON:  None. FINDINGS: RIGHT LOWER EXTREMITY Common Femoral Vein: No evidence of thrombus. Normal compressibility, respiratory phasicity and response to augmentation. Saphenofemoral Junction: No evidence of thrombus. Normal compressibility and flow on color Doppler imaging. Profunda Femoral Vein: No evidence of thrombus. Normal compressibility and flow on color Doppler imaging. Femoral Vein: No evidence of thrombus. Normal compressibility, respiratory phasicity and response to augmentation. Popliteal Vein: No evidence of thrombus. Normal compressibility, respiratory phasicity and response to augmentation. Calf Veins: Posterior tibial vein patent with compressibility. Limited visualization of the peroneal vein. Superficial Great Saphenous Vein: No evidence of thrombus. Normal compressibility and flow on color Doppler imaging. Other Findings:  None. LEFT LOWER EXTREMITY Common Femoral Vein: No evidence of thrombus. Normal compressibility, respiratory phasicity and response to augmentation. Saphenofemoral Junction: No evidence of thrombus. Normal compressibility and flow on color Doppler imaging. Profunda Femoral Vein: No evidence of thrombus. Normal compressibility and flow on color Doppler imaging. Femoral Vein: No evidence of thrombus. Normal compressibility, respiratory phasicity and response to augmentation. Popliteal Vein: No evidence of thrombus. Normal compressibility, respiratory phasicity and response to augmentation. Calf Veins: Posterior tibial vein patent with compressibility. Limited evaluation of the peroneal vein. Superficial Great Saphenous Vein: No evidence of  thrombus. Normal compressibility and flow on color Doppler imaging. Other Findings:  None. IMPRESSION: Sonographic survey of the bilateral lower extremities negative for DVT Electronically Signed   By: Corrie Mckusick D.O.   On: 07/16/2020 11:54   DG Chest Port 1 View  Result Date: 07/18/2020 CLINICAL DATA:  Acute respiratory failure, hypoxia EXAM: PORTABLE CHEST 1 VIEW COMPARISON:  None. FINDINGS: Endotracheal tube and nasogastric tube are  unchanged. Moderate left pleural effusion again identified with associated retrocardiac opacification. Small right pleural effusion suspected with opacification of the right lung base. Mild perihilar interstitial pulmonary edema is present, possibly cardiogenic in nature. Mild cardiomegaly is stable. No pneumothorax. IMPRESSION: Mild perihilar pulmonary edema and bilateral pleural effusions, right greater than left in keeping with mild cardiogenic failure, unchanged. Stable support tubes. Electronically Signed   By: Helyn Numbers MD   On: 07/18/2020 05:21   DG Chest Port 1 View  Result Date: 07/17/2020 CLINICAL DATA:  Intubated patient status post acute hypoxic respiratory failure 07/05/2020. EXAM: PORTABLE CHEST 1 VIEW COMPARISON:  Single-view of the chest 07/16/2020. FINDINGS: NG tube courses into the stomach and below the inferior margin of the film. Endotracheal tube tip is approximately 2 cm above the carina. Bibasilar airspace disease and effusions persist. There is cardiomegaly. No pneumothorax. IMPRESSION: No change in bilateral effusions and airspace disease. Electronically Signed   By: Drusilla Kanner M.D.   On: 07/17/2020 12:38   DG Chest Port 1 View  Result Date: 07/16/2020 CLINICAL DATA:  Shortness of breath, cardiac arrest EXAM: PORTABLE CHEST 1 VIEW COMPARISON:  07/16/2020 FINDINGS: Mild bibasilar opacities, likely reflecting small bilateral pleural effusions, right greater than left. Associated right lower lobe atelectasis. No frank interstitial  edema. No pneumothorax. Endotracheal tube terminates 15 mm above the carina. Enteric tube courses into the mid stomach. The heart is normal in size. IMPRESSION: Small bilateral pleural effusions, right greater than left. Associated right lower lobe atelectasis. Endotracheal tube terminates 15 mm above the carina. Enteric tube courses into the mid stomach. Electronically Signed   By: Charline Bills M.D.   On: 07/16/2020 11:55   DG Chest Portable 1 View  Result Date: 07/16/2020 CLINICAL DATA:  OG tube placement EXAM: PORTABLE CHEST 1 VIEW COMPARISON:  None. FINDINGS: Tip of the OG tube is seen within the proximal stomach. Hazy airspace opacities seen within both lower lungs. IMPRESSION: Tip of the OG tube within the proximal stomach. Electronically Signed   By: Jonna Clark M.D.   On: 07/16/2020 00:47   DG Chest Portable 1 View  Result Date: 07/12/2020 CLINICAL DATA:  Intubation EXAM: PORTABLE CHEST 1 VIEW COMPARISON:  Chest x-ray 08/21/2019 FINDINGS: Interval placement of an endotracheal tube with tip 7 cm above the carina. Enteric tube with tip overlying the expected region of the gastroesophageal junction and side port overlying the expected region of distal esophagus. Cardiac paddles overlie the chest. The heart size and mediastinal contours are unchanged with cardiomegaly. Increased interstitial markings within bilateral upper lobes. Diffuse vague patchy airspace opacities that are more prominent by within the bilateral lower lobes. No pleural effusion. No pneumothorax. No acute osseous abnormality. IMPRESSION: 1. Endotracheal tube with tip 7 cm above the carina. 2. Enteric tube with tip overlying the expected region of the gastroesophageal junction and side port the expected region of distal esophagus. Recommend advancement by 10 cm. 3. Pulmonary edema. 4. Bilateral lower lobe patchy airspace opacities could represent infection/inflammation. Electronically Signed   By: Tish Frederickson M.D.   On:  07/03/2020 21:08   ECHOCARDIOGRAM COMPLETE  Result Date: 07/16/2020    ECHOCARDIOGRAM REPORT   Patient Name:   SELMER ADDUCI Date of Exam: 07/16/2020 Medical Rec #:  [id number]       Height:       66.0 in Accession #:    [id number]      Weight:       253.1 lb Date of Birth:  Aug 21, 1945  BSA:          2.210 m Patient Age:    75 years        BP:           113/40 mmHg Patient Gender: M               HR:           72 bpm. Exam Location:  [facility] Procedure: 2D Echo, Color Doppler, Cardiac Doppler and Intracardiac            Opacification Agent Indications:     I46.9 Cardiac arrest  History:         Patient has prior history of Echocardiogram examinations. COPD;                  Risk Factors:Current Smoker. ETOH abuse.  Sonographer:     Charmayne Sheer RDCS (AE) Referring Phys:  [id number] Cheryll Dessert Diagnosing Phys: Bartholome Bill MD  Sonographer Comments: Technically difficult study due to poor echo windows and echo performed with patient supine and on artificial respirator. Image acquisition challenging due to patient body habitus. IMPRESSIONS  1. Left ventricular ejection fraction, by estimation, is 55 to 60%. The left ventricle has normal function. Left ventricular endocardial border not optimally defined to evaluate regional wall motion. Left ventricular diastolic function could not be evaluated.  2. Right ventricular systolic function is normal. The right ventricular size is normal.  3. The mitral valve is grossly normal. No evidence of mitral valve regurgitation.  4. The aortic valve was not well visualized. Aortic valve regurgitation is mild. Mild to moderate aortic valve stenosis. FINDINGS  Left Ventricle: Left ventricular ejection fraction, by estimation, is 55 to 60%. The left ventricle has normal function. Left ventricular endocardial border not optimally defined to evaluate regional wall motion. Definity contrast agent was given IV to delineate the left ventricular endocardial borders. The left  ventricular internal cavity size was normal in size. There is no left ventricular hypertrophy. Left ventricular diastolic function could not be evaluated. Right Ventricle: The right ventricular size is normal. No increase in right ventricular wall thickness. Right ventricular systolic function is normal. Left Atrium: Left atrial size was normal in size. Right Atrium: Right atrial size was normal in size. Pericardium: There is no evidence of pericardial effusion. Mitral Valve: The mitral valve is grossly normal. No evidence of mitral valve regurgitation. MV peak gradient, 6.0 mmHg. The mean mitral valve gradient is 2.0 mmHg. Tricuspid Valve: The tricuspid valve is not well visualized. Tricuspid valve regurgitation is trivial. Aortic Valve: The aortic valve was not well visualized. Aortic valve regurgitation is mild. Aortic regurgitation PHT measures 376 msec. Mild to moderate aortic stenosis is present. Aortic valve mean gradient measures 16.0 mmHg. Aortic valve peak gradient  measures 32.6 mmHg. Aortic valve area, by VTI measures 0.98 cm. Pulmonic Valve: The pulmonic valve was not assessed. Pulmonic valve regurgitation is not visualized. Aorta: The aortic root was not well visualized. IAS/Shunts: The interatrial septum was not well visualized.  LEFT VENTRICLE PLAX 2D LVOT diam:     2.10 cm      Diastology LV SV:         44           LV e' medial:    3.92 cm/s LV SV Index:   20           LV E/e' medial:  26.0 LVOT Area:     3.46 cm  LV e' lateral:   4.90 cm/s                             LV E/e' lateral: 20.8  LV Volumes (MOD) LV vol d, MOD A2C: 107.0 ml LV vol d, MOD A4C: 80.6 ml LV vol s, MOD A2C: 39.1 ml LV vol s, MOD A4C: 32.0 ml LV SV MOD A2C:     67.9 ml LV SV MOD A4C:     80.6 ml LV SV MOD BP:      57.7 ml RIGHT VENTRICLE RV Basal diam:  3.41 cm LEFT ATRIUM             Index       RIGHT ATRIUM           Index LA Vol (A2C):   47.1 ml 21.31 ml/m RA Area:     10.50 cm LA Vol (A4C):   49.6 ml 22.44 ml/m RA  Volume:   17.70 ml  8.01 ml/m LA Biplane Vol: 50.2 ml 22.71 ml/m  AORTIC VALVE AV Area (Vmax):    1.18 cm AV Area (Vmean):   1.03 cm AV Area (VTI):     0.98 cm AV Vmax:           285.33 cm/s AV Vmean:          182.000 cm/s AV VTI:            0.453 m AV Peak Grad:      32.6 mmHg AV Mean Grad:      16.0 mmHg LVOT Vmax:         97.30 cm/s LVOT Vmean:        53.900 cm/s LVOT VTI:          0.128 m LVOT/AV VTI ratio: 0.28 AI PHT:            376 msec MITRAL VALVE MV Area (PHT): 4.89 cm     SHUNTS MV Peak grad:  6.0 mmHg     Systemic VTI:  0.13 m MV Mean grad:  2.0 mmHg     Systemic Diam: 2.10 cm MV Vmax:       1.22 m/s MV Vmean:      66.2 cm/s MV Decel Time: 155 msec MV E velocity: 102.00 cm/s Bartholome Bill MD Electronically signed by Bartholome Bill MD Signature Date/Time: 07/16/2020/4:59:28 PM    Final     Microbiology Recent Results (from the past 240 hour(s))  Resp Panel by RT-PCR (Flu A&B, Covid) Nasopharyngeal Swab     Status: None   Collection Time: 06/18/2020  9:13 PM   Specimen: Nasopharyngeal Swab; Nasopharyngeal(NP) swabs in vial transport medium  Result Value Ref Range Status   SARS Coronavirus 2 by RT PCR NEGATIVE NEGATIVE Final    Comment: (NOTE) SARS-CoV-2 target nucleic acids are NOT DETECTED.  The SARS-CoV-2 RNA is generally detectable in upper respiratory specimens during the acute phase of infection. The lowest concentration of SARS-CoV-2 viral copies this assay can detect is 138 copies/mL. A negative result does not preclude SARS-Cov-2 infection and should not be used as the sole basis for treatment or other patient management decisions. A negative result may occur with  improper specimen collection/handling, submission of specimen other than nasopharyngeal swab, presence of viral mutation(s) within the areas targeted by this assay, and inadequate number of viral copies(<138 copies/mL). A negative result must be combined with clinical observations, patient history, and  epidemiological information. The  expected result is Negative.  Fact Sheet for Patients:  EntrepreneurPulse.com.au  Fact Sheet for Healthcare Providers:  IncredibleEmployment.be  This test is no t yet approved or cleared by the [place] FDA and  has been authorized for detection and/or diagnosis of SARS-CoV-2 by FDA under an Emergency Use Authorization (EUA). This EUA will remain  in effect (meaning this test can be used) for the duration of the COVID-19 declaration under Section 564(b)(1) of the Act, 21 U.S.C.section 360bbb-3(b)(1), unless the authorization is terminated  or revoked sooner.       Influenza A by PCR NEGATIVE NEGATIVE Final   Influenza B by PCR NEGATIVE NEGATIVE Final    Comment: (NOTE) The Xpert Xpress SARS-CoV-2/FLU/RSV plus assay is intended as an aid in the diagnosis of influenza from Nasopharyngeal swab specimens and should not be used as a sole basis for treatment. Nasal washings and aspirates are unacceptable for Xpert Xpress SARS-CoV-2/FLU/RSV testing.  Fact Sheet for Patients: EntrepreneurPulse.com.au  Fact Sheet for Healthcare Providers: IncredibleEmployment.be  This test is not yet approved or cleared by the [place] FDA and has been authorized for detection and/or diagnosis of SARS-CoV-2 by FDA under an Emergency Use Authorization (EUA). This EUA will remain in effect (meaning this test can be used) for the duration of the COVID-19 declaration under Section 564(b)(1) of the Act, 21 U.S.C. section 360bbb-3(b)(1), unless the authorization is terminated or revoked.  Performed at [facility], [address] [place], [place] [address]   Culture, blood (routine x 2)     Status: None   Collection Time: 06/20/2020  9:18 PM   Specimen: BLOOD  Result Value Ref Range Status   Specimen Description BLOOD LEFT ANTECUBITAL  Final   Special Requests   Final     BOTTLES DRAWN AEROBIC AND ANAEROBIC Blood Culture results may not be optimal due to an inadequate volume of blood received in culture bottles   Culture   Final    NO GROWTH 5 DAYS Performed at [facility], [address] [place], [place] [address]    Report Status 08/14/2020 FINAL  Final  MRSA PCR Screening     Status: Abnormal   Collection Time: 07/16/20  5:55 AM   Specimen: Nasopharyngeal  Result Value Ref Range Status   MRSA by PCR POSITIVE (A) NEGATIVE Final    Comment:        The GeneXpert MRSA Assay (FDA approved for NASAL specimens only), is one component of a comprehensive MRSA colonization surveillance program. It is not intended to diagnose MRSA infection nor to guide or monitor treatment for MRSA infections. RESULT CALLED TO, READ BACK BY AND VERIFIED WITHSallye Lat AT [id number] 07/16/20 SDR Performed at [facility], [address], [place], [place] [address]   Urine Culture     Status: None   Collection Time: 07/16/20  5:05 PM   Specimen: Urine, Random  Result Value Ref Range Status   Specimen Description   Final    URINE, RANDOM Performed at [facility], [address] [place], [place] [address]    Special Requests   Final    NONE Performed at [facility], [address] [place], [place] [address]    Culture   Final    NO GROWTH Performed at [facility], [place] [address] [place], [place] [address]    Report Status 07/18/2020 FINAL  Final  Culture, respiratory     Status: None   Collection Time: 07/17/20  9:14 AM   Specimen: Tracheal Aspirate; Respiratory  Result Value Ref Range Status   Specimen Description   Final    TRACHEAL ASPIRATE Performed at [facility], [address] [place], [place] [address]    Special Requests   Final    NONE Performed at [facility], [place], [place] [address]    Gram Stain   Final    FEW WBC PRESENT,BOTH PMN AND  MONONUCLEAR RARE GRAM POSITIVE COCCI RARE GRAM VARIABLE ROD Performed at [facility], [place] [address] [place], [place] [address]    Culture   Final    FEW STREPTOCOCCUS PNEUMONIAE FEW METHICILLIN RESISTANT STAPHYLOCOCCUS AUREUS    Report Status 07-21-2020 FINAL  Final   Organism ID, Bacteria METHICILLIN RESISTANT STAPHYLOCOCCUS AUREUS  Final   Organism ID, Bacteria STREPTOCOCCUS PNEUMONIAE  Final      Susceptibility   Methicillin resistant staphylococcus aureus - MIC*    CIPROFLOXACIN <=0.5 SENSITIVE Sensitive     ERYTHROMYCIN >=8 RESISTANT Resistant     GENTAMICIN <=0.5 SENSITIVE Sensitive     OXACILLIN >=4 RESISTANT Resistant     TETRACYCLINE <=1 SENSITIVE Sensitive     VANCOMYCIN <=0.5 SENSITIVE Sensitive     TRIMETH/SULFA <=10 SENSITIVE Sensitive     CLINDAMYCIN <=0.25 SENSITIVE Sensitive     RIFAMPIN <=0.5 SENSITIVE Sensitive     Inducible Clindamycin NEGATIVE Sensitive     * FEW METHICILLIN RESISTANT STAPHYLOCOCCUS AUREUS   Streptococcus pneumoniae - MIC*    ERYTHROMYCIN <=0.12 SENSITIVE Sensitive     LEVOFLOXACIN 1 SENSITIVE Sensitive     VANCOMYCIN 0.5 SENSITIVE Sensitive     PENO - penicillin <=0.06      PENICILLIN (non-meningitis) <=0.06 SENSITIVE Sensitive     PENICILLIN (oral) <=0.06 SENSITIVE Sensitive     CEFTRIAXONE (non-meningitis) <=0.12 SENSITIVE Sensitive     * FEW STREPTOCOCCUS PNEUMONIAE    Lab Basic Metabolic Panel: Recent Labs  Lab 07/16/20 0510 07/16/20 1418 07/17/20 0702 07/17/20 1721 07/18/20 0510 07/19/20 0512  NA 137 137 136  --  137 140  K 4.4 4.9 4.0  --  3.7 3.4*  CL 103 104 102  --  103 108  CO2 25 24 24   --  24 26  GLUCOSE 143* 120* 98  --  107* 131*  BUN 21 26* 36*  --  45* 40*  CREATININE 1.62* 2.03* 2.40*  --  1.69* 1.44*  CALCIUM 8.0* 7.9* 7.5*  --  8.1* 8.1*  MG 2.0 2.0 2.1 2.2 2.4 2.3  PHOS 3.5 5.0* 3.7 3.5 3.0 2.6   Liver Function Tests: Recent Labs  Lab 07/16/2020 2113 07/16/20 0510  AST 55* 47*  ALT 35 38   ALKPHOS 75 60  BILITOT 0.8 0.6  PROT 7.0 6.8  ALBUMIN 3.4* 3.4*   No results for input(s): LIPASE, AMYLASE in the last 168 hours. No results for input(s): AMMONIA in the last 168 hours. CBC: Recent Labs  Lab 07/11/2020 2113 07/16/20 0510 07/17/20 0702 07/18/20 0510 07/19/20 0512  WBC 21.7* 14.3* 15.1* 12.4* 7.7  NEUTROABS 11.1* 12.5*  --   --   --   HGB 14.1 14.4 12.4* 13.6 11.5*  HCT 44.8 42.9 38.6* 41.5 35.2*  MCV 102.3* 97.9 100.8* 99.0 98.9  PLT 323 284 288 232 198   Cardiac Enzymes: No results for input(s): CKTOTAL, CKMB, CKMBINDEX, TROPONINI in the last 168 hours. Sepsis Labs: Recent Labs  Lab 07/17/2020 2113 07/18/2020 2246 07/16/20 0510 07/16/20 [id number] 07/16/20 [id number]  07/16/20 1224 07/17/20 0702 07/18/20 0510 07/19/20 0512  PROCALCITON  --   --   --   --   --  5.62 2.71 1.79 1.07  WBC 21.7*  --  14.3*  --   --   --  15.1* 12.4* 7.7  LATICACIDVEN 5.4* 2.1*  --  1.1 1.1  --   --   --   --     Procedures/Operations  12/29: Intubation, mechanical ventilation 12/30: EEG>>This study is suggestive of severe diffuse encephalopathy, nonspecific etiology butcould berelated to sedation, anoxic/hypoxic brain injury.No seizures or epileptiform discharges were seen throughout the recording. 12/30: CT Head>>1. Patchy areas of hypoattenuation in the right ACA and PCA territories compatible with acute/subacute infarcts. Local mass effect present with some effacement of the sulci but no midline shift. 2. No acute hemorrhage. 3. Remote left temporal lobe infarct. 4. Atherosclerosis. 12/31: MRI Brain>> multiple areas of acute cortical infarct in the right MCA territory involving right frontal parietal cortex with mild associated hemorrhage.  Acute infarct on the right occipital lobe with right PCA territory.  Small chronic infarct on the left lateral temporal lobe.   Renold Don, MD [facility] 08/11/2020, 4:04 PM   *This note was dictated using voice recognition  software/Dragon.  Despite best efforts to proofread, errors can occur which can change the meaning.  Any change was purely unintentional.

## 2020-08-19 NOTE — Progress Notes (Signed)
Progress Note  Patient Name: Antonio [name] Date of Encounter: 2020-08-19  [facility] Cardiologist: [facility]  Subjective   Intubated sedated MRI concerning for stroke Patchy areas of hypoattenuation in the right ACA and PCA territories compatible with acute/subacute infarcts  -Severe carotid disease bilaterally with stenosis, intravascular disease  Telemetry with normal sinus rhythm Normal ejection fraction EEG severe diffuse encephalopathy etiology unclear  Family at the bedside, long discussion with 2 sons concerning goals of care If indicated he would not want aggressive measures such as being intubated, or further cardiac work-up They would like comfort measures to start today  Inpatient Medications    Scheduled Meds: . mouth rinse  15 mL Mouth Rinse 10 times per day   Continuous Infusions: . sodium chloride 5 mL/hr at 08/19/2020 0800  . fentaNYL infusion INTRAVENOUS Stopped (07/19/20 1833)  . morphine 7 mg/hr (19-Aug-2020 1058)   PRN Meds: acetaminophen, glycopyrrolate, LORazepam, midazolam   Vital Signs    Vitals:   08-19-2020 0900 08-19-20 1000 Aug 19, 2020 1100 2020/08/19 1200  BP: (!) 169/37 (!) 165/41 (!) 167/48   Pulse: (!) 59 (!) 57 (!) 57 (!) 150  Resp: (!) 24 (!) 24 (!) 24 (!) 23  Temp:      TempSrc:      SpO2: 97% 96% 98% (!) 66%  Weight:      Height:        Intake/Output Summary (Last 24 hours) at 08-19-2020 1345 Last data filed at August 19, 2020 1200 Gross per 24 hour  Intake 405.66 ml  Output 1500 ml  Net -1094.34 ml   Last 3 Weights 19-Aug-2020 07/19/2020 07/16/2020  Weight (lbs) 257 lb 15 oz 258 lb 6.1 oz 253 lb 1.4 oz  Weight (kg) 117 kg 117.2 kg 114.8 kg      Telemetry    Normal sinus rhythm- Personally Reviewed  ECG    - Personally Reviewed  Physical Exam   Constitutional: Intubated sedated HENT:  Head: Grossly normal Eyes:  no discharge. No scleral icterus.  Neck: Unable to estimate JVD, no carotid bruits  Cardiovascular: Regular rate and  rhythm, no murmurs appreciated Pulmonary/Chest coarse breath sounds Abdominal: Soft.  no distension.  Musculoskeletal: Unable to test Neurological: Per neurology, unable to test Skin: Skin warm and dry Psychiatric: Sedated   Labs    High Sensitivity Troponin:   Recent Labs  Lab 06/19/2020 2113 06/22/2020 2252 07/16/20 0510 07/16/20 0634  TROPONINIHS 162* 297* 1,455* 1,380*      Chemistry Recent Labs  Lab 06/21/2020 2113 07/16/20 0510 07/16/20 1418 07/17/20 0702 07/18/20 0510 07/19/20 0512  NA 139 137   < > 136 137 140  K 3.9 4.4   < > 4.0 3.7 3.4*  CL 104 103   < > 102 103 108  CO2 22 25   < > 24 24 26   GLUCOSE 312* 143*   < > 98 107* 131*  BUN 15 21   < > 36* 45* 40*  CREATININE 1.62* 1.62*   < > 2.40* 1.69* 1.44*  CALCIUM 8.0* 8.0*   < > 7.5* 8.1* 8.1*  PROT 7.0 6.8  --   --   --   --   ALBUMIN 3.4* 3.4*  --   --   --   --   AST 55* 47*  --   --   --   --   ALT 35 38  --   --   --   --   ALKPHOS 75 60  --   --   --   --  BILITOT 0.8 0.6  --   --   --   --   GFRNONAA 44* 44*   < > 27* 42* 51*  ANIONGAP 13 9   < > 10 10 6    < > = values in this interval not displayed.     Hematology Recent Labs  Lab 07/17/20 0702 07/18/20 0510 07/19/20 0512  WBC 15.1* 12.4* 7.7  RBC 3.83* 4.19* 3.56*  HGB 12.4* 13.6 11.5*  HCT 38.6* 41.5 35.2*  MCV 100.8* 99.0 98.9  MCH 32.4 32.5 32.3  MCHC 32.1 32.8 32.7  RDW 14.5 14.6 14.5  PLT 288 232 198    BNP Recent Labs  Lab 06/28/2020 2113 07/16/20 0510  BNP 562.8* 1,001.1*     DDimer No results for input(s): DDIMER in the last 168 hours.   Radiology    MR ANGIO HEAD WO CONTRAST  Result Date: 07/18/2020 CLINICAL DATA:  Stroke follow-up EXAM: MRI HEAD WITHOUT CONTRAST MRA HEAD WITHOUT CONTRAST TECHNIQUE: Multiplanar, multiecho pulse sequences of the brain and surrounding structures were obtained without intravenous contrast. Angiographic images of the head were obtained using MRA technique without contrast. COMPARISON:   CT head 07/17/2020 FINDINGS: MRI HEAD FINDINGS Brain: Acute infarct right occipital pole. Small acute infarct right lateral temporal lobe. Scattered cortical infarcts involving the right frontal parietal lobe over the convexity in the right MCA territory. Small amount of associated hemorrhage in the right frontal convexity infarct and in the right anterior frontal infarct. Small acute infarct left frontal cortex over the convexity. Small acute infarct left head of caudate. Ventricle size normal. Chronic infarct left lateral temporal lobe. Negative for mass lesion. Vascular: Normal arterial flow voids Skull and upper cervical spine: No focal skeletal lesion. Sinuses/Orbits: Extensive mucosal edema paranasal sinuses. Negative orbit Other: None MRA HEAD FINDINGS Right vertebral artery dominant. Both vertebral arteries patent to the basilar without stenosis. PICA patent bilaterally. Basilar patent. Superior cerebellar and posterior cerebral arteries patent bilaterally. Fetal origin right posterior cerebral artery. Irregularity and moderate to severe stenosis in the cavernous carotid bilaterally. No occlusion. Anterior and middle cerebral arteries patent bilaterally. Mild stenosis left A1 and left M1 segments. Negative for large vessel occlusion. Negative for aneurysm. IMPRESSION: 1. Multiple areas of acute cortical infarct in the right MCA territory involving right frontal parietal cortex with mild associated hemorrhage. Acute infarct in the right occipital lobe in the right PCA territory. There is fetal origin of the right posterior cerebral artery which could account for this infarct. Possible emboli. In addition, small areas of acute infarct in the left frontal cortex over the convexity and head of caudate on the left. 2. Small chronic infarct left lateral temporal lobe. 3. 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 No intracranial large vessel occlusion. Electronically  Signed   By: 07/19/2020 M.D.   On: 07/18/2020 16:24   MR ANGIO NECK W WO CONTRAST  Result Date: 07/18/2020 CLINICAL DATA:  Stroke EXAM: MRA NECK WITHOUT AND WITH CONTRAST TECHNIQUE: Multiplanar and multiecho pulse sequences of the neck were obtained without and with intravenous contrast. Angiographic images of the neck were obtained using MRA technique without and with intravenous contrast. CONTRAST:  50mL GADAVIST GADOBUTROL 1 MMOL/ML IV SOLN COMPARISON:  None. FINDINGS: Antegrade flow in the carotid and vertebral arteries bilaterally. Carotid bifurcation patent bilaterally without stenosis. There is severe stenosis in the cavernous carotid bilaterally due to atherosclerotic disease Right vertebral artery dominant and widely patent to the basilar. Non dominant right vertebral  artery is patent to the basilar with mild stenosis distally. This vessel is best seen on the source images. 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 Both vertebral arteries patent to the basilar. Electronically Signed   By: Franchot Gallo M.D.   On: 07/18/2020 16:26   MR BRAIN WO CONTRAST  Result Date: 07/18/2020 CLINICAL DATA:  Stroke follow-up EXAM: MRI HEAD WITHOUT CONTRAST MRA HEAD WITHOUT CONTRAST TECHNIQUE: Multiplanar, multiecho pulse sequences of the brain and surrounding structures were obtained without intravenous contrast. Angiographic images of the head were obtained using MRA technique without contrast. COMPARISON:  CT head 07/17/2020 FINDINGS: MRI HEAD FINDINGS Brain: Acute infarct right occipital pole. Small acute infarct right lateral temporal lobe. Scattered cortical infarcts involving the right frontal parietal lobe over the convexity in the right MCA territory. Small amount of associated hemorrhage in the right frontal convexity infarct and in the right anterior frontal infarct. Small acute infarct left frontal cortex over the  convexity. Small acute infarct left head of caudate. Ventricle size normal. Chronic infarct left lateral temporal lobe. Negative for mass lesion. Vascular: Normal arterial flow voids Skull and upper cervical spine: No focal skeletal lesion. Sinuses/Orbits: Extensive mucosal edema paranasal sinuses. Negative orbit Other: None MRA HEAD FINDINGS Right vertebral artery dominant. Both vertebral arteries patent to the basilar without stenosis. PICA patent bilaterally. Basilar patent. Superior cerebellar and posterior cerebral arteries patent bilaterally. Fetal origin right posterior cerebral artery. Irregularity and moderate to severe stenosis in the cavernous carotid bilaterally. No occlusion. Anterior and middle cerebral arteries patent bilaterally. Mild stenosis left A1 and left M1 segments. Negative for large vessel occlusion. Negative for aneurysm. IMPRESSION: 1. Multiple areas of acute cortical infarct in the right MCA territory involving right frontal parietal cortex with mild associated hemorrhage. Acute infarct in the right occipital lobe in the right PCA territory. There is fetal origin of the right posterior cerebral artery which could account for this infarct. Possible emboli. In addition, small areas of acute infarct in the left frontal cortex over the convexity and head of caudate on the left. 2. Small chronic infarct left lateral temporal lobe. 3. 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 No intracranial large vessel occlusion. Electronically Signed   By: Franchot Gallo M.D.   On: 07/18/2020 16:24    Cardiac Studies   Echocardiogram 1. Left ventricular ejection fraction, by estimation, is 55 to 60%. The  left ventricle has normal function. Left ventricular endocardial border  not optimally defined to evaluate regional wall motion. Left ventricular  diastolic function could not be  evaluated.  2. Right ventricular systolic function is normal. The  right ventricular  size is normal.  3. The mitral valve is grossly normal. No evidence of mitral valve  regurgitation.  4. The aortic valve was not well visualized. Aortic valve regurgitation  is mild. Mild to moderate aortic valve stenosis.   Patient Profile     Mr. Beecham is a 75 year old gentleman with COPD, prior alcohol smoking history, medication noncompliance, hypertension, morbid obesity, January 2021 with acute hypoxic and hypercapnic respiratory failure requiring 2-week intubation, presenting with worsening shortness of breath, respiratory distress   Assessment & Plan    Non-STEMI  In the setting of acute on chronic respiratory failure with hypoxia  COPD , sleep apnea, morbid obesity Echocardiogram with no focal wall motion abnormality  -Heparin held for stroke No plan for cardiac catheterization, confirmed with family They would like comfort care  Stroke  Diffuse PAD, bilateral carotid stenoses, intravascular disease Very high risk for additional embolic  -Followed by neurology  Nonsustained VT  EF normal on echo  Amiodarone infusion held No further arrhythmia on telemetry   Acute renal failure  Likely component of ATN   PEA arrest   acute on chronic hypoxic and hypercarbic respiratory failure  Similar events 1 year ago on ventilator 2 weeks  exacerbated by COPD, pulmonary edema  -Confirmed with family, he would not have wanted aggressive care    Total encounter time more than 25 minutes  Greater than 50% was spent in counseling and coordination of care with the patient  [facility] will sign off.   Medication Recommendations: No changes Other recommendations (labs, testing, etc): No further testing Follow up as an outpatient: Hospice  For questions or updates, please contact [place] Please consult [email or web address] for contact info under        Signed, Ida Rogue, MD  02-Aug-2020, 1:45 PM

## 2020-08-19 DEATH — deceased

## 2020-09-03 LAB — BLOOD GAS, ARTERIAL
Acid-base deficit: 1.8 mmol/L (ref 0.0–2.0)
Bicarbonate: 24.3 mmol/L (ref 20.0–28.0)
FIO2: 0.7
MECHVT: 450 mL
O2 Saturation: 99.4 %
PEEP: 8 cmH2O
Patient temperature: 37
RATE: 24 resp/min
pCO2 arterial: 45 mmHg (ref 32.0–48.0)
pH, Arterial: 7.34 — ABNORMAL LOW (ref 7.350–7.450)
pO2, Arterial: 167 mmHg — ABNORMAL HIGH (ref 83.0–108.0)

## 2020-09-03 LAB — BLOOD GAS, VENOUS
Acid-base deficit: 10.5 mmol/L — ABNORMAL HIGH (ref 0.0–2.0)
Bicarbonate: 21.2 mmol/L (ref 20.0–28.0)
FIO2: 1
MECHVT: 450 mL
O2 Saturation: 96.8 %
PEEP: 8 cmH2O
Patient temperature: 37
RATE: 18 resp/min
pCO2, Ven: 75 mmHg (ref 44.0–60.0)
pH, Ven: 7.06 — CL (ref 7.250–7.430)
pO2, Ven: 121 mmHg — ABNORMAL HIGH (ref 32.0–45.0)
